# Patient Record
Sex: Female | Born: 1940
Health system: Southern US, Community
[De-identification: ages and names within clinical notes are randomized; demographics above are authoritative.]

## PROBLEM LIST (undated history)

## (undated) DIAGNOSIS — R112 Nausea with vomiting, unspecified: Secondary | ICD-10-CM

## (undated) DIAGNOSIS — K5730 Diverticulosis of large intestine without perforation or abscess without bleeding: Secondary | ICD-10-CM

## (undated) DIAGNOSIS — R17 Unspecified jaundice: Secondary | ICD-10-CM

## (undated) DIAGNOSIS — A0471 Enterocolitis due to Clostridium difficile, recurrent: Secondary | ICD-10-CM

## (undated) DIAGNOSIS — K648 Other hemorrhoids: Secondary | ICD-10-CM

## (undated) DIAGNOSIS — E039 Hypothyroidism, unspecified: Secondary | ICD-10-CM

## (undated) DIAGNOSIS — K219 Gastro-esophageal reflux disease without esophagitis: Secondary | ICD-10-CM

## (undated) DIAGNOSIS — F329 Major depressive disorder, single episode, unspecified: Secondary | ICD-10-CM

## (undated) DIAGNOSIS — A6000 Herpesviral infection of urogenital system, unspecified: Secondary | ICD-10-CM

## (undated) DIAGNOSIS — J449 Chronic obstructive pulmonary disease, unspecified: Secondary | ICD-10-CM

## (undated) DIAGNOSIS — Z9889 Other specified postprocedural states: Secondary | ICD-10-CM

## (undated) DIAGNOSIS — K589 Irritable bowel syndrome without diarrhea: Secondary | ICD-10-CM

## (undated) DIAGNOSIS — A498 Other bacterial infections of unspecified site: Secondary | ICD-10-CM

## (undated) DIAGNOSIS — F419 Anxiety disorder, unspecified: Secondary | ICD-10-CM

## (undated) DIAGNOSIS — M1990 Unspecified osteoarthritis, unspecified site: Secondary | ICD-10-CM

## (undated) DIAGNOSIS — J383 Other diseases of vocal cords: Secondary | ICD-10-CM

## (undated) DIAGNOSIS — G629 Polyneuropathy, unspecified: Secondary | ICD-10-CM

## (undated) DIAGNOSIS — F32A Depression, unspecified: Secondary | ICD-10-CM

## (undated) DIAGNOSIS — H269 Unspecified cataract: Secondary | ICD-10-CM

## (undated) HISTORY — DX: Enterocolitis due to Clostridium difficile, recurrent: A04.71

## (undated) HISTORY — PX: ABDOMINAL HYSTERECTOMY: SHX81

## (undated) HISTORY — DX: Gastro-esophageal reflux disease without esophagitis: K21.9

## (undated) HISTORY — PX: CHOLECYSTECTOMY: SHX55

## (undated) HISTORY — PX: BREAST BIOPSY: SHX20

## (undated) HISTORY — DX: Unspecified osteoarthritis, unspecified site: M19.90

## (undated) HISTORY — DX: Hypothyroidism, unspecified: E03.9

## (undated) HISTORY — DX: Other hemorrhoids: K64.8

## (undated) HISTORY — PX: SPINE SURGERY: SHX786

## (undated) HISTORY — PX: VESICOVAGINAL FISTULA CLOSURE W/ TAH: SUR271

## (undated) HISTORY — DX: Unspecified cataract: H26.9

## (undated) HISTORY — PX: OTHER SURGICAL HISTORY: SHX169

## (undated) HISTORY — DX: Polyneuropathy, unspecified: G62.9

## (undated) HISTORY — DX: Other bacterial infections of unspecified site: A49.8

## (undated) HISTORY — DX: Chronic obstructive pulmonary disease, unspecified: J44.9

## (undated) HISTORY — DX: Anxiety disorder, unspecified: F41.9

## (undated) HISTORY — DX: Diverticulosis of large intestine without perforation or abscess without bleeding: K57.30

---

## 2001-11-28 ENCOUNTER — Encounter: Payer: Self-pay | Admitting: Family Medicine

## 2001-11-28 ENCOUNTER — Ambulatory Visit (HOSPITAL_COMMUNITY): Admission: RE | Admit: 2001-11-28 | Discharge: 2001-11-28 | Payer: Self-pay | Admitting: Family Medicine

## 2002-08-21 ENCOUNTER — Encounter: Payer: Self-pay | Admitting: *Deleted

## 2002-08-21 ENCOUNTER — Ambulatory Visit (HOSPITAL_COMMUNITY): Admission: RE | Admit: 2002-08-21 | Discharge: 2002-08-21 | Payer: Self-pay | Admitting: *Deleted

## 2002-08-28 ENCOUNTER — Encounter: Payer: Self-pay | Admitting: Family Medicine

## 2002-08-28 ENCOUNTER — Ambulatory Visit (HOSPITAL_COMMUNITY): Admission: RE | Admit: 2002-08-28 | Discharge: 2002-08-28 | Payer: Self-pay | Admitting: Family Medicine

## 2002-10-29 ENCOUNTER — Ambulatory Visit (HOSPITAL_COMMUNITY): Admission: RE | Admit: 2002-10-29 | Discharge: 2002-10-29 | Payer: Self-pay | Admitting: Family Medicine

## 2002-10-29 ENCOUNTER — Encounter: Payer: Self-pay | Admitting: Family Medicine

## 2002-10-31 ENCOUNTER — Encounter: Payer: Self-pay | Admitting: Family Medicine

## 2002-10-31 ENCOUNTER — Ambulatory Visit (HOSPITAL_COMMUNITY): Admission: RE | Admit: 2002-10-31 | Discharge: 2002-10-31 | Payer: Self-pay | Admitting: Family Medicine

## 2003-03-21 ENCOUNTER — Encounter (INDEPENDENT_AMBULATORY_CARE_PROVIDER_SITE_OTHER): Payer: Self-pay | Admitting: Specialist

## 2003-03-21 ENCOUNTER — Ambulatory Visit (HOSPITAL_BASED_OUTPATIENT_CLINIC_OR_DEPARTMENT_OTHER): Admission: RE | Admit: 2003-03-21 | Discharge: 2003-03-21 | Payer: Self-pay | Admitting: Obstetrics and Gynecology

## 2004-07-23 ENCOUNTER — Ambulatory Visit (HOSPITAL_COMMUNITY): Admission: RE | Admit: 2004-07-23 | Discharge: 2004-07-23 | Payer: Self-pay | Admitting: Family Medicine

## 2004-08-07 ENCOUNTER — Ambulatory Visit (HOSPITAL_COMMUNITY): Admission: RE | Admit: 2004-08-07 | Discharge: 2004-08-07 | Payer: Self-pay | Admitting: Family Medicine

## 2005-08-23 ENCOUNTER — Emergency Department (HOSPITAL_COMMUNITY): Admission: EM | Admit: 2005-08-23 | Discharge: 2005-08-23 | Payer: Self-pay | Admitting: Emergency Medicine

## 2006-03-30 ENCOUNTER — Ambulatory Visit (HOSPITAL_COMMUNITY): Admission: RE | Admit: 2006-03-30 | Discharge: 2006-03-30 | Payer: Self-pay | Admitting: Obstetrics and Gynecology

## 2006-10-11 ENCOUNTER — Ambulatory Visit (HOSPITAL_COMMUNITY): Admission: RE | Admit: 2006-10-11 | Discharge: 2006-10-11 | Payer: Self-pay | Admitting: Family Medicine

## 2006-10-13 ENCOUNTER — Ambulatory Visit: Payer: Self-pay | Admitting: Orthopedic Surgery

## 2007-11-08 ENCOUNTER — Ambulatory Visit (HOSPITAL_COMMUNITY): Admission: RE | Admit: 2007-11-08 | Discharge: 2007-11-08 | Payer: Self-pay | Admitting: Family Medicine

## 2007-11-17 ENCOUNTER — Ambulatory Visit (HOSPITAL_COMMUNITY): Admission: RE | Admit: 2007-11-17 | Discharge: 2007-11-17 | Payer: Self-pay | Admitting: Internal Medicine

## 2008-01-12 ENCOUNTER — Ambulatory Visit (HOSPITAL_COMMUNITY): Admission: RE | Admit: 2008-01-12 | Discharge: 2008-01-12 | Payer: Self-pay | Admitting: Neurosurgery

## 2008-04-24 ENCOUNTER — Ambulatory Visit (HOSPITAL_COMMUNITY): Admission: RE | Admit: 2008-04-24 | Discharge: 2008-04-24 | Payer: Self-pay | Admitting: Internal Medicine

## 2008-06-20 HISTORY — PX: COLONOSCOPY: SHX174

## 2008-06-25 ENCOUNTER — Ambulatory Visit (HOSPITAL_COMMUNITY): Admission: RE | Admit: 2008-06-25 | Discharge: 2008-06-25 | Payer: Self-pay | Admitting: Family Medicine

## 2008-09-23 ENCOUNTER — Emergency Department (HOSPITAL_COMMUNITY): Admission: EM | Admit: 2008-09-23 | Discharge: 2008-09-23 | Payer: Self-pay | Admitting: Emergency Medicine

## 2008-11-06 ENCOUNTER — Inpatient Hospital Stay (HOSPITAL_COMMUNITY): Admission: RE | Admit: 2008-11-06 | Discharge: 2008-11-08 | Payer: Self-pay | Admitting: Neurosurgery

## 2009-01-08 ENCOUNTER — Ambulatory Visit (HOSPITAL_COMMUNITY): Admission: RE | Admit: 2009-01-08 | Discharge: 2009-01-08 | Payer: Self-pay | Admitting: Family Medicine

## 2009-03-20 ENCOUNTER — Ambulatory Visit (HOSPITAL_COMMUNITY): Admission: RE | Admit: 2009-03-20 | Discharge: 2009-03-20 | Payer: Self-pay | Admitting: Internal Medicine

## 2009-05-26 ENCOUNTER — Ambulatory Visit (HOSPITAL_COMMUNITY): Admission: RE | Admit: 2009-05-26 | Discharge: 2009-05-26 | Payer: Self-pay | Admitting: Neurosurgery

## 2009-08-22 ENCOUNTER — Ambulatory Visit (HOSPITAL_COMMUNITY): Admission: RE | Admit: 2009-08-22 | Discharge: 2009-08-22 | Payer: Self-pay | Admitting: Internal Medicine

## 2009-09-04 ENCOUNTER — Ambulatory Visit (HOSPITAL_COMMUNITY): Admission: RE | Admit: 2009-09-04 | Discharge: 2009-09-04 | Payer: Self-pay | Admitting: Ophthalmology

## 2009-09-11 ENCOUNTER — Ambulatory Visit (HOSPITAL_COMMUNITY): Admission: RE | Admit: 2009-09-11 | Discharge: 2009-09-11 | Payer: Self-pay | Admitting: Family Medicine

## 2009-09-15 ENCOUNTER — Emergency Department (HOSPITAL_COMMUNITY): Admission: EM | Admit: 2009-09-15 | Discharge: 2009-09-15 | Payer: Self-pay | Admitting: Emergency Medicine

## 2009-10-27 ENCOUNTER — Ambulatory Visit (HOSPITAL_COMMUNITY): Admission: RE | Admit: 2009-10-27 | Discharge: 2009-10-27 | Payer: Self-pay | Admitting: Internal Medicine

## 2010-01-05 ENCOUNTER — Ambulatory Visit (HOSPITAL_COMMUNITY)
Admission: RE | Admit: 2010-01-05 | Discharge: 2010-01-05 | Payer: Self-pay | Source: Home / Self Care | Admitting: Family Medicine

## 2010-01-19 ENCOUNTER — Ambulatory Visit (HOSPITAL_COMMUNITY): Admission: RE | Admit: 2010-01-19 | Discharge: 2010-01-19 | Payer: Self-pay | Admitting: Internal Medicine

## 2010-06-28 ENCOUNTER — Emergency Department (HOSPITAL_COMMUNITY): Admission: EM | Admit: 2010-06-28 | Discharge: 2010-06-28 | Payer: Self-pay | Admitting: Emergency Medicine

## 2010-08-05 ENCOUNTER — Ambulatory Visit (HOSPITAL_COMMUNITY)
Admission: RE | Admit: 2010-08-05 | Discharge: 2010-08-05 | Payer: Self-pay | Admitting: Physical Medicine and Rehabilitation

## 2010-08-13 ENCOUNTER — Ambulatory Visit (HOSPITAL_COMMUNITY): Admission: RE | Admit: 2010-08-13 | Discharge: 2010-08-13 | Payer: Self-pay | Admitting: Family Medicine

## 2010-09-24 HISTORY — PX: JOINT REPLACEMENT: SHX530

## 2010-10-07 ENCOUNTER — Emergency Department (HOSPITAL_COMMUNITY)
Admission: EM | Admit: 2010-10-07 | Discharge: 2010-10-07 | Payer: Self-pay | Source: Home / Self Care | Admitting: Emergency Medicine

## 2010-10-25 HISTORY — PX: EYE SURGERY: SHX253

## 2010-10-28 ENCOUNTER — Emergency Department (HOSPITAL_COMMUNITY)
Admission: EM | Admit: 2010-10-28 | Discharge: 2010-10-29 | Payer: Self-pay | Source: Home / Self Care | Admitting: Emergency Medicine

## 2010-10-28 LAB — CBC
HCT: 43 % (ref 36.0–46.0)
Hemoglobin: 15.5 g/dL — ABNORMAL HIGH (ref 12.0–15.0)
MCH: 33.8 pg (ref 26.0–34.0)
MCHC: 36 g/dL (ref 30.0–36.0)
MCV: 93.9 fL (ref 78.0–100.0)
Platelets: 299 10*3/uL (ref 150–400)
RBC: 4.58 MIL/uL (ref 3.87–5.11)
RDW: 14.1 % (ref 11.5–15.5)
WBC: 8.6 10*3/uL (ref 4.0–10.5)

## 2010-10-28 LAB — COMPREHENSIVE METABOLIC PANEL
ALT: 14 U/L (ref 0–35)
AST: 15 U/L (ref 0–37)
Albumin: 3.2 g/dL — ABNORMAL LOW (ref 3.5–5.2)
Alkaline Phosphatase: 80 U/L (ref 39–117)
BUN: 7 mg/dL (ref 6–23)
CO2: 24 mEq/L (ref 19–32)
Calcium: 8.8 mg/dL (ref 8.4–10.5)
Chloride: 100 mEq/L (ref 96–112)
Creatinine, Ser: 0.67 mg/dL (ref 0.4–1.2)
GFR calc Af Amer: >60 mL/min (ref >60)
GFR calc non Af Amer: >60 mL/min (ref >60)
Glucose, Bld: 91 mg/dL (ref 70–99)
Potassium: 3.6 mEq/L (ref 3.5–5.1)
Sodium: 133 mEq/L — ABNORMAL LOW (ref 135–145)
Total Bilirubin: 0.7 mg/dL (ref 0.3–1.2)
Total Protein: 6.4 g/dL (ref 6.0–8.3)

## 2010-10-28 LAB — DIFFERENTIAL
Basophils Absolute: 0 10*3/uL (ref 0.0–0.1)
Basophils Relative: 0 % (ref 0–1)
Eosinophils Absolute: 0.1 10*3/uL (ref 0.0–0.7)
Eosinophils Relative: 1 % (ref 0–5)
Lymphocytes Relative: 24 % (ref 12–46)
Lymphs Abs: 2 10*3/uL (ref 0.7–4.0)
Monocytes Absolute: 0.8 10*3/uL (ref 0.1–1.0)
Monocytes Relative: 10 % (ref 3–12)
Neutro Abs: 5.7 10*3/uL (ref 1.7–7.7)
Neutrophils Relative %: 66 % (ref 43–77)

## 2010-10-28 LAB — LIPASE, BLOOD: Lipase: 18 U/L (ref 11–59)

## 2010-11-18 ENCOUNTER — Encounter: Payer: Self-pay | Admitting: Internal Medicine

## 2010-11-18 ENCOUNTER — Ambulatory Visit
Admission: RE | Admit: 2010-11-18 | Discharge: 2010-11-18 | Payer: Self-pay | Source: Home / Self Care | Attending: Internal Medicine | Admitting: Internal Medicine

## 2010-11-18 DIAGNOSIS — R110 Nausea: Secondary | ICD-10-CM | POA: Insufficient documentation

## 2010-11-18 DIAGNOSIS — R1013 Epigastric pain: Secondary | ICD-10-CM | POA: Insufficient documentation

## 2010-11-19 ENCOUNTER — Encounter: Payer: Self-pay | Admitting: Gastroenterology

## 2010-11-25 ENCOUNTER — Ambulatory Visit (HOSPITAL_COMMUNITY)
Admission: RE | Admit: 2010-11-25 | Discharge: 2010-11-25 | Disposition: A | Discharge status: Home / Self Care | Payer: Medicare Other | Attending: Internal Medicine | Admitting: Internal Medicine

## 2010-11-25 ENCOUNTER — Encounter: Payer: BLUE CROSS/BLUE SHIELD | Admitting: Internal Medicine

## 2010-11-25 DIAGNOSIS — K449 Diaphragmatic hernia without obstruction or gangrene: Secondary | ICD-10-CM | POA: Insufficient documentation

## 2010-11-25 DIAGNOSIS — R110 Nausea: Secondary | ICD-10-CM | POA: Insufficient documentation

## 2010-11-25 DIAGNOSIS — R1310 Dysphagia, unspecified: Secondary | ICD-10-CM

## 2010-11-25 DIAGNOSIS — R197 Diarrhea, unspecified: Secondary | ICD-10-CM | POA: Insufficient documentation

## 2010-11-25 DIAGNOSIS — R1013 Epigastric pain: Secondary | ICD-10-CM | POA: Insufficient documentation

## 2010-11-25 HISTORY — PX: ESOPHAGOGASTRODUODENOSCOPY: SHX1529

## 2010-11-26 NOTE — Assessment & Plan Note (Signed)
Summary: per RMR pt needs URGENT spot today w/AS   Visit Type:  Initial Consult Referring Provider:  Dr. Regino Schultze Primary Care Provider:  Dr. Regino Schultze  CC:  nausea x 4 weeks.  History of Present Illness: Pt presents today at the request of Dr. Regino Schultze secondary to nausea and reported diarrhea. She states she is here mainly due to nausea. Was seen in the ED a week before seeing Dr. Regino Schultze Jan 17th, secondary to nausea. Has had cdiff culture, stool cultures, all reportedly negative. We do not have these results currently. Denies emesis. +loose stools started a few weeks ago, little bits at a time. Had bout of constipation last week for four days. Loose stools seem to be tapering off. No abdominal pain, but does report epigastric soreness X 2-3 weeks.  +nausea, intermittently, relieved by "shots" for several days, then starts again. not exacerbated by eating/drinking. can tolerate potatoes, vanilla milkshake, crackers. Does have dysphagia with certain types of textures, such as bread, pills. no loss of appetite. no weight loss. Takes BC powders, at least 1-2 per day. Usually averages every other day BC powders.  No sick contacts, no abx. no travel. No change of medicine. Hx of reflux, hasn't taken omeprazole secondary to feeling so nauseated. Restarted omeprazole on "Sunday. Recently had thumb joint replacement October 20, 2010.   As of note, had a colonoscopy approximately a year ago with Dr. Rehman. Pt also remembers an upper endoscopy at some point prior to this. She believes she may have had a dilation at that time as well. No reports available but have been requested.   Current Medications (verified): 1)  Levothyroxine Sodium 25 Mcg Tabs (Levothyroxine Sodium) .... Once Daily 2)  Promethazine Hcl 25 Mg Tabs (Promethazine Hcl) .... As Needed 3)  Omeprazole 20 Mg Cpdr (Omeprazole) .... Once Daily  Allergies (verified): 1)  ! Sulfa 2)  ! Morphine 3)  ! Codeine 4)  ! Avelox 5)  ! Dilaudid  (Hydromorphone Hcl)  Past History:  Past Medical History: GERD Hypothyroidism Arthritis TCS 2010 or 2011?  Past Surgical History: Anterior cervical fusion X 2 Joint replacement (thumb) 09/2010 Hysterectomy Cholecystectomy  Family History: Mother:living, recurrent UTIs, otherwise non-contributory Father:deceased, Alzheimers Brother: thyroidectomy No FH of Colon Cancer:  Social History: Retired Married X 25 years Patient currently smokes. <1ppd X 50 years Alcohol Use - yes, rare  Smoking Status:  current  Review of Systems General:  Denies fever, chills, and anorexia. Eyes:  Denies blurring, irritation, and discharge. ENT:  Complains of difficulty swallowing; denies sore throat and hoarseness. CV:  Denies chest pains and syncope. Resp:  Denies dyspnea at rest and wheezing. GI:  Complains of difficulty swallowing, nausea, and diarrhea; denies pain on swallowing, bloody BM's, and black BMs. GU:  Denies urinary burning and urinary frequency. MS:  Denies joint pain / LOM, joint swelling, and joint stiffness. Derm:  Denies rash, itching, and dry skin. Neuro:  Denies weakness and syncope. Psych:  Denies depression and anxiety. Endo:  Denies cold intolerance and heat intolerance.  Vital Signs:  Patient profile:   70 year old female Height:      63 inches Weight:      135 pounds BMI:     24.00 Temp:     97" .9 degrees F oral Pulse rate:   84 / minute BP sitting:   120 / 78  (right arm) Cuff size:   regular  Vitals Entered By: Hendricks Limes LPN (November 18, 2010 11:08 AM)  Physical Exam  General:  Well developed, well nourished, no acute distress. Head:  Normocephalic and atraumatic. Eyes:  PERRLA, no icterus. Lungs:  Clear throughout to auscultation. Heart:  Regular rate and rhythm; no murmurs, rubs,  or bruits. Abdomen:  +BS, soft, mildly tender to palpation epigastric region. no rebound or guarding. no HSM>  Msk:  Symmetrical with no gross deformities. Normal  posture. Pulses:  Normal pulses noted. Neurologic:  Alert and  oriented x4;  grossly normal neurologically. Skin:  Intact without significant lesions or rashes. Psych:  Alert and cooperative. Normal mood and affect.  Impression & Recommendations:  Problem # 1:  NAUSEA (ICD-42.41)  70 year old female with intermittent nausea, +dysphagia with bread/pills. +epigastric pain. No loss of appetite. reported hx of upper endoscopy a few years ago with possible dilatation (these records are not available but have been requested). Also concerning is consistent use of BC powders, daily, and +smoker. Likely nausea, epigastric pain r/t possible gastritis vs PUD.   Switch to Dexilant. Samples have been given. Pt will call if notices significant difference EGD/ED with RMR in the near future: the R/B/A have been discussed in detail, and pt desires to proceed Obtain colonoscopy and EGD reports from [facility] any recent blood work from Dr. Regino Schultze  Orders: Consultation Level III [phone])  Problem # 2:  ABDOMINAL PAIN-EPIGASTRIC (ICD-789.06)  See #1  Orders: Consultation Level III (60454)  Problem # 3:  DIARRHEA (ICD-787.91)  Diarrhea seems to be self-limited, actually improving. Doubt infectious process. Has had recent colonoscopy but no reports available. Stool studies pending, awaiting results. Reassurring that this is resolving.   May take immodium as needed.  Review stool studies when available Contact if no improvement  Orders: Consultation Level III (09811)

## 2010-11-26 NOTE — Letter (Signed)
Summary: EGD/ED ORDER  EGD/ED ORDER   Imported By: Ave Filter 11/18/2010 12:02:08  _____________________________________________________________________  External Attachment:    Type:   Image     Comment:   External Document

## 2010-11-26 NOTE — Letter (Signed)
Summary: REFERRAL FROM [facility]  REFERRAL FROM [facility]   Imported By: Rexene Alberts 11/19/2010 09:32:12  _____________________________________________________________________  External Attachment:    Type:   Image     Comment:   External Document

## 2010-11-27 ENCOUNTER — Telehealth (INDEPENDENT_AMBULATORY_CARE_PROVIDER_SITE_OTHER): Payer: Self-pay

## 2010-11-28 NOTE — Op Note (Signed)
NAME:  Mary Hart, Mary Hart              ACCOUNT NO.:  [id number]  MEDICAL RECORD NO.:  [id number]           PATIENT TYPE:  O  LOCATION:  DAY                           FACILITY:  [facility]  PHYSICIAN:  R. Roetta Sessions, M.D. DATE OF BIRTH:  Nov 03, 1940  DATE OF PROCEDURE:  11/25/2010 DATE OF DISCHARGE:                              OPERATIVE REPORT   PROCEDURE:  EGD with Elease Hashimoto dilation.  INDICATIONS FOR PROCEDURE:  A 70 year old lady with recent protracted episode of nausea and self-limiting diarrhea following a thumb joint replacement procedure well over month ago.  Has had intermittent epigastric pain and worsening esophageal dysphagia to solids.  She has been exposed to narcotics related to orthopedic surgery recently to which she is very sensitive.  Just recently, she switched from omeprazole to Dexilant through our office and this was associated with marked improvement in reflux, epigastric pain, and nausea.  She rarely takes any antiemetic therapy now and is eating very well.  Because of her upper GI tract symptoms, particularly dysphagia, EGD is now being done.  Risks, benefits, limitations, alternatives, imponderables have been discussed, questions answered.  Please see the documentation in the medical record.  PROCEDURE NOTE:  O2 saturation, blood pressure, pulse, respirations were monitored throughout the entire procedure.  CONSCIOUS SEDATION:  Versed 5 mg IV, Demerol mg IV in divided doses.  INSTRUMENT:  Pentax video chip system.  FINDINGS:  EGD examination of tubular esophagus revealed a patent tubular esophagus, mucosa appeared normal.  EG junction easily traversed with scope.  Stomach:  Gastric cavity was emptied and insufflated well with air. Thorough examination of gastric mucosa including retroflexion of proximal stomach and esophagogastric junction demonstrated no abnormalities aside from small hiatal hernia.  Pylorus was patent, easily traversed.  Examination of  the bulb and second portion revealed no abnormalities.  THERAPEUTIC/DIAGNOSTIC MANEUVERS PERFORMED:  Scope was withdrawn.  A 56- French Maloney dilator was passed to full insertion with slight resistance upon full insertion.  A look back revealed two superficial tears to the mucosa, there was minimal bleeding, otherwise no apparent complication related to passage of the dilator.  The patient tolerated the procedure well, was reactive to endoscopy.  IMPRESSION: 1. Normal-appearing tubular esophagus status post passage of the 10-     French Maloney dilator from a small hiatal hernia, otherwise     stomach and duodenum through the second portion appeared normal. 2. I suspect recent narcotic intolerance is a contributing factor to     nausea, poorly-controlled reflux symptoms on omeprazole and much     better all the way around Dexilant, however, the patient tells me     it is a 2 or 3 medication with her insurance coverage cannot     afford it.  She has not tried Prevacid previously.  RECOMMENDATIONS: 1. Stop Dexilant, try Prevacid 30 mg orally daily, generic     prescription provided. 2. Stop smoking. 3. Minimized use of aspirin powders. 4. Chloraseptic spray for the next day or two if she has a sore     throat.     Jonathon Bellows, M.D.     RMR/MEDQ  D:  11/25/2010  T:  11/25/2010  Job:  [id number]  cc:   Kirk Ruths, M.D. Fax: [phone]  Electronically Signed by Lorrin Goodell M.D. on 11/26/2010 09:31:37 AM

## 2010-11-30 ENCOUNTER — Ambulatory Visit: Payer: Self-pay | Admitting: Urgent Care

## 2010-12-02 NOTE — Progress Notes (Addendum)
Summary: nausea  Phone Note Call from Patient Call back at Home Phone [phone]   Caller: Patient Summary of Call: pt called- she started taking the prevacid this morning and now has diarrhea and cramping in her stomach and alot of nausea. no SOB, no fever, no vomiting. pt has taken phenergan 25mg  today with no help. Advised pt to stop taking prevacid for now. please advise. Initial call taken by: Hendricks Limes LPN,  November 27, 2010 2:12 PM     Appended Document: nausea Called to check on pt. Said she is doing good now. She thinks she was sick from the anesthesia, she was over the diarrhea and cramping in a couple of days and states she is doing good now.

## 2010-12-17 ENCOUNTER — Other Ambulatory Visit: Payer: Self-pay | Admitting: Ophthalmology

## 2010-12-17 ENCOUNTER — Encounter (HOSPITAL_COMMUNITY): Payer: Medicare Other

## 2010-12-17 DIAGNOSIS — Z01818 Encounter for other preprocedural examination: Secondary | ICD-10-CM | POA: Insufficient documentation

## 2010-12-17 LAB — BASIC METABOLIC PANEL
BUN: 9 mg/dL (ref 6–23)
Calcium: 9 mg/dL (ref 8.4–10.5)
Chloride: 105 mEq/L (ref 96–112)
Creatinine, Ser: 0.78 mg/dL (ref 0.4–1.2)
GFR calc Af Amer: >60 mL/min (ref >60)
GFR calc non Af Amer: >60 mL/min (ref >60)
Glucose, Bld: 46 mg/dL — ABNORMAL LOW (ref 70–99)

## 2010-12-17 LAB — HEMOGLOBIN AND HEMATOCRIT, BLOOD: Hemoglobin: 14 g/dL (ref 12.0–15.0)

## 2010-12-21 ENCOUNTER — Ambulatory Visit (HOSPITAL_COMMUNITY)
Admission: RE | Admit: 2010-12-21 | Discharge: 2010-12-21 | Disposition: A | Discharge status: Home / Self Care | Payer: Medicare Other | Source: Ambulatory Visit | Attending: Ophthalmology | Admitting: Ophthalmology

## 2010-12-21 DIAGNOSIS — Z01818 Encounter for other preprocedural examination: Secondary | ICD-10-CM | POA: Insufficient documentation

## 2010-12-21 DIAGNOSIS — H2510 Age-related nuclear cataract, unspecified eye: Secondary | ICD-10-CM | POA: Insufficient documentation

## 2010-12-30 ENCOUNTER — Telehealth (INDEPENDENT_AMBULATORY_CARE_PROVIDER_SITE_OTHER): Payer: Self-pay

## 2011-01-05 NOTE — Progress Notes (Signed)
Summary: pt requests change in PPI due to financial situation  Phone Note Call from Patient   Caller: Patient Summary of Call: Pt came by office and said the Lansoprazole is working well. It just cost $39.00. She and her husband's medical bills exceed $700.00 monthly. She is just trying to get something on her insurance less expensive. She can do the Omerazole 20 mg (which has done well in the past... or Pantoprozole 20 mg, which she hasn't tried. She would like to get a prescription called to [place] in [place].  Initial call taken by: Cloria Spring LPN,  December 30, 2010 11:24 AM     Appended Document: pt requests change in PPI due to financial situation efficacy of omeprazole seemed to be less recently; suggest we go w generic protonix 69m mg daily  - Rx #30 one refill  Appended Document: pt requests change in PPI due to financial situation Pt aware, Rx  called to [facility] @ [facility].

## 2011-01-07 LAB — POCT CARDIAC MARKERS
CKMB, poc: 1.7 ng/mL (ref 1.0–8.0)
Myoglobin, poc: 55.3 ng/mL (ref 12–200)
Troponin i, poc: <0.05 ng/mL (ref 0.00–0.09)

## 2011-01-10 LAB — CREATININE, SERUM
Creatinine, Ser: 0.66 mg/dL (ref 0.4–1.2)
GFR calc Af Amer: >60 mL/min (ref >60)
GFR calc non Af Amer: >60 mL/min (ref >60)

## 2011-01-27 LAB — BASIC METABOLIC PANEL
CO2: 26 mEq/L (ref 19–32)
Calcium: 8.7 mg/dL (ref 8.4–10.5)
Creatinine, Ser: 0.76 mg/dL (ref 0.4–1.2)
GFR calc Af Amer: >60 mL/min (ref >60)
GFR calc non Af Amer: >60 mL/min (ref >60)
Sodium: 141 mEq/L (ref 135–145)

## 2011-01-27 LAB — GLUCOSE, CAPILLARY: Glucose-Capillary: 87 mg/dL (ref 70–99)

## 2011-01-27 LAB — HEMOGLOBIN AND HEMATOCRIT, BLOOD: HCT: 41 % (ref 36.0–46.0)

## 2011-02-03 ENCOUNTER — Other Ambulatory Visit: Payer: Self-pay | Admitting: Internal Medicine

## 2011-02-03 NOTE — Telephone Encounter (Signed)
Ok to refill x 1 year 

## 2011-02-08 LAB — CBC
MCHC: 33.9 g/dL (ref 30.0–36.0)
MCV: 95.5 fL (ref 78.0–100.0)
RBC: 4.35 MIL/uL (ref 3.87–5.11)
RDW: 14.4 % (ref 11.5–15.5)

## 2011-02-23 ENCOUNTER — Ambulatory Visit: Payer: BLUE CROSS/BLUE SHIELD | Admitting: Gastroenterology

## 2011-03-09 NOTE — H&P (Signed)
NAMEJAZMINN, POMALES NO.:  [id number]   MEDICAL RECORD NO.:  [id number]          PATIENT TYPE:  INP   LOCATION:  3015                         FACILITY:  [facility]   PHYSICIAN:  Hilda Lias, M.D.   DATE OF BIRTH:  1941/04/30   DATE OF ADMISSION:  11/06/2008  DATE OF DISCHARGE:                              HISTORY & PHYSICAL   Ms. Blackwelder is lady who came to my office a year ago complaining of neck  pain, which has been going on for more than a year.  She had physical  therapy.  She feels that she is getting worse.  We did a conservative  treatment, which did not improve her condition.  She had an epidural  injection.  We did an ultrasound myelogram that showed severe stenosis  at the level of 4-5.  Because of that, she is being admitted.  Previously, this lady has had C5, 6, and 7 fusion about 10 years ago.   PAST MEDICAL HISTORY:  1. Cervical diskectomy and fusion at 5-6, and 6-7 in 1990.  2. Hysterectomy.  3. Cholecystectomy.   She is allergic to SULFA.   FAMILY HISTORY:  Unremarkable.   PHYSICAL EXAMINATION:  HEAD, EARS, NOSE, AND THROAT:  Normal.  NECK:  She has a decreased flexibility of her cervical spine.  LUNGS:  Clear.  HEART:  Heart sounds normal.  EXTREMITIES:  Normal pulses.  NEUROLOGIC:  She has a weakness of both deltoids.  She has numbness of  the left hand.   The surgery myelogram showed that she has stenosis at the level of C4-  C5, mostly going to the left side and some tissue also in the left side.   CLINICAL IMPRESSION:  Cervical stenosis at C4-C5 with some soft tissue  at the level of 5-6.   RECOMMENDATIONS:  She had failed with conservative treatment.  We are  going to take her to surgery, and we are going to do a C5 corpectomy and  that way we are going to take care of the C4-C5 stenosis, as well as the  soft tissue, which is in the 5-6, mostly going to the left side.  She  knows about the surgery.  She knows that she is going to  wear her brace  for at least 4-6-week.  The procedure will be C5 corpectomy with fusion  from C4-C6.  The risk of the course was fully explained to her and is  similar to the what she had 20 years ago including the possibility of  infection, CSF leak, worsening pain, paralysis, and need of further  surgery.          ______________________________  Hilda Lias, M.D.    EB/MEDQ  D:  11/06/2008  T:  11/06/2008  Job:  [id number]

## 2011-03-09 NOTE — Op Note (Signed)
NAMEDELORICE, BANNISTER NO.:  [id number]   MEDICAL RECORD NO.:  [id number]          PATIENT TYPE:  INP   LOCATION:  3015                         FACILITY:  [facility]   PHYSICIAN:  Hilda Lias, M.D.   DATE OF BIRTH:  1941-06-01   DATE OF PROCEDURE:  11/06/2008  DATE OF DISCHARGE:                               OPERATIVE REPORT   PREOPERATIVE DIAGNOSES:  C4-C5 stenosis status post fusion C5-C6 and C6-  C7 20 years ago, chronic radiculopathy.   POSTOPERATIVE DIAGNOSES:  C4-C5 stenosis status post fusion C5-C6 and C6-  C7 20 years ago, chronic radiculopathy.   PROCEDURES:  C5 corpectomy, decompression of the spinal cord and  bilateral foraminotomy, C4-C5 and C5-C6, graft plate, and microscope.   SURGEON:  Hilda Lias, MD   ASSISTANT:  Coletta Memos, MD   CLINICAL HISTORY:  Ms. Darko is a 71 year old lady who about 20 years  ago underwent cervical fusion by me at the level of C5-C6 and C6-C7.  I  have been seeing this lady until about a year ago when she complained of  more pain.  She had failed with conservative treatment.  Myelogram shows  stenosis at the level of C4-C5 and soft tissue at the level of C5-C6 on  the left side.  The radiologist was not quite sure about this being disk  which I doubt old scar tissue.  Nevertheless, the main problem was on  the left side and the main complaint of the patient was to the left  side.  Because of that, surgery was advised.  The patient knew about the  risks.   PROCEDURE IN DETAIL:  The patient was taken to the OR.  After  intubation, the left side of the neck was cleaned with DuraPrep.  The  patient had quite a bit of the large shoulder and I was interfering with  doing a transverse incision.  Because of that, we decided to go ahead  with a longitudinal incision through the skin, platysma down to the  cervical area.  The patient had quite a bit of adhesion and lysis was  accomplished.  We found the disk space and  x-rays showed that indeed we  were at the level of C4-C5.  Because of that, using the drill with  drilling at the C5 vertebral body and complete corpectomy was  accomplished.  The patient had quite a bit of scar tissue.  On the left  side, she has some narrow, mostly at the C4-C5 to the left and that was  made bigger using 2-3 mm Kerrison punch.  The right side C5-C6 was  opened.  The left side at the level of C5-C6, we found mostly scar  tissue.  Lysis was accomplished with decompression of the C6 nerve root.  At the level of C4-C5 to the left, we have overgrowth of the joint  producing narrow to canal quite a bit of scar tissue.  Lysis was  accomplished and foraminotomy was done.  From then on, we took a piece  of the graft of approximately 19 mm.  It was tailored to going to  the  cavity.  We pulled ahead and we were able to set the graft at the level of the  wound defect without any problem.  Then, a plate using 4 screws was  involved to keep the bone graft in place.  The area was irrigated.  Nevertheless, we left a drain in the paracervical area.  Hemostasis was  accomplished.  The wound was closed with Vicryl and Steri-Strips.           ______________________________  Hilda Lias, M.D.     EB/MEDQ  D:  11/06/2008  T:  11/07/2008  Job:  [id number]

## 2011-03-12 NOTE — Op Note (Signed)
   NAME:  Mary Hart, Mary Hart                        ACCOUNT NO.:  [id number]   MEDICAL RECORD NO.:  [id number]                   PATIENT TYPE:  AMB   LOCATION:  [facility]                                 [facility]ACILITY[facility]  [facility]   PHYSICIAN:  Katherine Roan, M.D.               DATE OF BIRTH:  May 09, 1941   DATE OF PROCEDURE:  03/21/2003  DATE OF DISCHARGE:                                 OPERATIVE REPORT   PREOPERATIVE DIAGNOSIS:  Perineal lesion.   POSTOPERATIVE DIAGNOSIS:  Perineal lesion, rule out Bowen's disease.   DESCRIPTION OF PROCEDURE:  The patient was placed in the lithotomy position  and prepped and draped in the usual sterile fashion. The lesion  was  infiltrated with 0.5% Marcaine with epinephrine and excised with margins.  The area was then cauterized with the Bovie and closed with interrupted  sutures of 4-0 Vicryl and 3-0 plain.   The patient tolerated the procedure well. She was sent to the recovery room  in good condition.                                                  Katherine Roan, M.D.    SDM/MEDQ  D:  03/21/2003  T:  03/21/2003  Job:  [id number]

## 2011-08-09 ENCOUNTER — Other Ambulatory Visit (HOSPITAL_COMMUNITY): Payer: Self-pay | Admitting: Orthopedic Surgery

## 2011-08-09 DIAGNOSIS — M25569 Pain in unspecified knee: Secondary | ICD-10-CM

## 2011-08-11 ENCOUNTER — Ambulatory Visit (HOSPITAL_COMMUNITY)
Admission: RE | Admit: 2011-08-11 | Discharge: 2011-08-11 | Disposition: A | Discharge status: Home / Self Care | Payer: Medicare Other | Source: Ambulatory Visit | Attending: Orthopedic Surgery | Admitting: Orthopedic Surgery

## 2011-08-11 DIAGNOSIS — M7120 Synovial cyst of popliteal space [Baker], unspecified knee: Secondary | ICD-10-CM | POA: Insufficient documentation

## 2011-08-11 DIAGNOSIS — M25569 Pain in unspecified knee: Secondary | ICD-10-CM | POA: Insufficient documentation

## 2011-08-11 DIAGNOSIS — M25469 Effusion, unspecified knee: Secondary | ICD-10-CM | POA: Insufficient documentation

## 2011-08-11 DIAGNOSIS — M2240 Chondromalacia patellae, unspecified knee: Secondary | ICD-10-CM | POA: Insufficient documentation

## 2011-10-21 ENCOUNTER — Ambulatory Visit (HOSPITAL_COMMUNITY): Payer: Medicare Other | Admitting: Psychiatry

## 2011-11-02 ENCOUNTER — Encounter (HOSPITAL_COMMUNITY): Payer: Self-pay | Admitting: Psychiatry

## 2011-11-02 ENCOUNTER — Ambulatory Visit (INDEPENDENT_AMBULATORY_CARE_PROVIDER_SITE_OTHER): Payer: Medicare Other | Admitting: Psychiatry

## 2011-11-02 DIAGNOSIS — F329 Major depressive disorder, single episode, unspecified: Secondary | ICD-10-CM | POA: Diagnosis not present

## 2011-11-02 NOTE — Patient Instructions (Signed)
Discussed orally 

## 2011-11-04 NOTE — Progress Notes (Signed)
Patient:   Mary Hart   DOB:   12-Apr-1941  MR Number:  [id number]  Location:  [address], [place], [place] [address]  Date of Service:   11/02/2011  Start Time:   10:30 AM End Time:   11:30 AM  Provider/Observer:  Florencia Reasons, MSW, LCSW delete this  Billing Code/Service:  [phone]  Chief Complaint:     Chief Complaint  Patient presents with  . Depression    Reason for Service:  The patient is referred for services by Dr. Arley Hart  due to to patient experiencing anger, frustration, and depression. Patient reports she has experienced symptoms several years but they have become more severe since her husband suffered a traumatic brain injury in April 2012. She expresses frustration and fear that husband will not stop smoking although he has COPD. She also reports her husband has severe anxiety. The patient also reports stress related to caretaker responsibilities for her 81 year old mother who has been extremely critical of patient and whom he should suspects has bipolar disorder as her mother experience anger tirades.  Current Status:  The patient reports anger, irritability, loss of interest, crying spells, and depressed mood  Reliability of Information: Patient appears to be reliable informant.  Behavioral Observation: Mary Hart  presents as a 71 y.o.-year-old  Caucasian Female who appeared her stated age. Her dress was appropriate and she was well groomed. Her manners were appropriate to the situation.  There were not any physical disabilities noted.  She displayed an appropriate level of cooperation and motivation.    Interactions:    Active   Attention:   within normal limits  Memory:   within normal limits  Visuo-spatial:   within normal limits  Speech (Volume):  normal  Speech:   normal pitch and normal volume  Thought Process:  Coherent  Though Content:  WNL  Orientation:   person, place, time/date, situation, day of week, month of year and  year  Judgment:   Good  Planning:   Good  Affect:    Appropriate  Mood:    Depressed  Insight:   Good  Intelligence:   normal  Marital Status/Living: The patient was born in [place], [place]. She is the oldest of 5 siblings. She reports having a loving audible father and a judgment will hateful mother during her childhood.  The patient has been married twice. She left her first marriage after 13 years due to her husband's infidelity. She has a 77 year old son from that marriage. She and her current husband have been married for 25 years. The patient has 1 stepson age 69, and 2 stepdaughters ages 86 and 73. Patient and her husband reside in [place].  Current Employment: Retired  Past Employment:  Patient reports a stable work history at [employer] where she was employed for 38 years.  Substance Use:  No concerns of substance abuse are reported.    Education:   HS Graduate. Patient reports completing 1 semester at college.  Medical History:   Past Medical History  Diagnosis Date  . GERD (gastroesophageal reflux disease)   . Hypothyroid   . Arthritis   . Neuropathy     Sexual History:   History  Sexual Activity  . Sexually Active:     Abuse/Trauma History: The patient reports her mother was verbally abusive and was extremely critical of the patient as well as her siblings.  Psychiatric History:  The patient denies any psychiatric hospitalizations. She reports no previous involvement  in outpatient therapy. She has been prescribed Xanax by her primary care physician but reports seldom taking this. She also reports being in prescribed Lexapro by her OB/GYN several years ago but reports having an adverse reaction.  Family Med/Psych History: Mother-depression, suspicious of bipolar disorder; maternal grandmother and maternal aunt-anxiety disorder; brother, niece, and nephew-bipolar disorder; maternal uncle and brother-alcohol dependence/abuse. Patient also reports one  of her brothers completed suicide  Risk of Suicide/Violence: None. Patient denies past and current suicidal and homicidal ideations. She reports no history of aggression or violent behavior  Impression/DX:  The patient reports experiencing anger, irritability, depressed mood, and loss of interest in activities. She reports suffering mild symptoms of depression for years with symptoms becoming worse since April 2012 when her husband suffered a traumatic brain injury. She also reports stress related to caretaker responsibilities for her 69-year-old mother. Depressive disorder NOS  Disposition/Plan:  The patient attends the assessment appointment today. Confidentiality and limits are discussed. The patient agrees to call this practice, call 911, or have someone take her to the emergency room should symptoms worsen. Crisis contact information is provided. She agrees to return for an appointment in one week for continuing assessment and treatment planning.  Diagnosis:    Axis I:   1. Depressive disorder         Axis II: Deferred       Axis III:  See medical history      Axis IV:  problems with primary support group          Axis V:  51-60 moderate symptoms

## 2011-11-10 DIAGNOSIS — H43819 Vitreous degeneration, unspecified eye: Secondary | ICD-10-CM | POA: Diagnosis not present

## 2011-11-10 DIAGNOSIS — H353190 Nonexudative age-related macular degeneration, unspecified eye, stage unspecified: Secondary | ICD-10-CM | POA: Diagnosis not present

## 2011-11-10 DIAGNOSIS — H43399 Other vitreous opacities, unspecified eye: Secondary | ICD-10-CM | POA: Diagnosis not present

## 2011-11-16 ENCOUNTER — Ambulatory Visit (HOSPITAL_COMMUNITY): Payer: Medicare Other | Admitting: Psychiatry

## 2011-11-18 ENCOUNTER — Ambulatory Visit (INDEPENDENT_AMBULATORY_CARE_PROVIDER_SITE_OTHER): Payer: Medicare Other | Admitting: Psychiatry

## 2011-11-18 ENCOUNTER — Encounter (HOSPITAL_COMMUNITY): Payer: Self-pay | Admitting: Psychiatry

## 2011-11-18 DIAGNOSIS — F329 Major depressive disorder, single episode, unspecified: Secondary | ICD-10-CM

## 2011-11-18 DIAGNOSIS — H9209 Otalgia, unspecified ear: Secondary | ICD-10-CM | POA: Diagnosis not present

## 2011-11-18 DIAGNOSIS — H60399 Other infective otitis externa, unspecified ear: Secondary | ICD-10-CM | POA: Diagnosis not present

## 2011-11-22 NOTE — Progress Notes (Signed)
Patient:  Mary Hart   DOB: 27-Oct-1940  MR Number: [id number]  [facility]:  [address] [place] [place],  [place], [address]  Start: 11/18/2011 11:30 AM End: 11/18/2011 12:25 PM  Provider/Observer:     Florencia Reasons, MSW, LCSW   Chief Complaint:      Chief Complaint  Patient presents with  . Depression    Reason For Service:     The patient was referred for services by Dr. Arley Phenix due to patient experiencing anger, frustration, and depression. The patient reports she has experienced symptoms several years but they have become more severe since her husband suffered a traumatic brain injury in April 2012. She expresses frustration and fear that her husband will not stop smoking although he has COPD. She also reports her husband has severe anxiety. The patient reports stress related to her caretaker responsibilities for her 3 year old mother who has been extremely critical of patient and whom patient suspects has bipolar disorder as her mother experiences anger tirades.  Interventions Strategy:  Supportive therapy  Participation Level:   Active  Participation Quality:  Appropriate      Behavioral Observation:  Well Groomed, Alert, and Appropriate.   Current Psychosocial Factors: The patient has caretaker responsibilities for her husband as well as her 81 year old mother.  Content of Session:   Establishing rapport, reviewing symptoms, identifying stressors, identifying her support system, identifying ways to use her support system, identifying possible community resources, identifying areas within patient's control.  Current Status:   The patient reports continued anxiety, depressed mood, anger, and irritability.  Patient Progress:   Fair. The patient expresses continued frustration regarding her husband as he has severe anxiety and frequently experiences panic attacks. Patient reports her husband often wants to go to the doctor or the emergency room. She  also expresses frustration regarding the daily assistance she has to provide to has been due to his traumatic brain injury. The patient reports being tired and overwhelmed. She reports little to no time for self.. She has been reluctant to solicit help from her support group.  Target Goals:   Decrease anxiety, improve coping and stress management skills  Last Reviewed:   11/18/2011  Goals Addressed Today:    Decrease anxiety, improve coping and stress management skills  Impression/Diagnosis:   The patient reports experiencing anger, irritability, depressed mood, loss of interest in activities. She reports suffering mild symptoms of depression for years with symptoms worsening since April 2012 when her husband suffered a traumatic brain injury. She also reports stress related to caretaker responsibilities for her 69 year old mother. Diagnoses depressive disorder NOS  Diagnosis:  Axis I:  1. Depressive disorder             Axis II: No diagnosis

## 2011-11-22 NOTE — Patient Instructions (Signed)
Discussed orally 

## 2011-12-01 DIAGNOSIS — R109 Unspecified abdominal pain: Secondary | ICD-10-CM | POA: Diagnosis not present

## 2011-12-01 DIAGNOSIS — R197 Diarrhea, unspecified: Secondary | ICD-10-CM | POA: Diagnosis not present

## 2011-12-01 DIAGNOSIS — M25579 Pain in unspecified ankle and joints of unspecified foot: Secondary | ICD-10-CM | POA: Diagnosis not present

## 2011-12-01 DIAGNOSIS — Z6825 Body mass index (BMI) 25.0-25.9, adult: Secondary | ICD-10-CM | POA: Diagnosis not present

## 2012-01-04 ENCOUNTER — Telehealth (HOSPITAL_COMMUNITY): Payer: Self-pay | Admitting: Dietician

## 2012-01-04 DIAGNOSIS — J069 Acute upper respiratory infection, unspecified: Secondary | ICD-10-CM | POA: Diagnosis not present

## 2012-01-04 DIAGNOSIS — L821 Other seborrheic keratosis: Secondary | ICD-10-CM | POA: Diagnosis not present

## 2012-01-04 DIAGNOSIS — L219 Seborrheic dermatitis, unspecified: Secondary | ICD-10-CM | POA: Diagnosis not present

## 2012-01-04 DIAGNOSIS — J210 Acute bronchiolitis due to respiratory syncytial virus: Secondary | ICD-10-CM | POA: Diagnosis not present

## 2012-01-04 DIAGNOSIS — D235 Other benign neoplasm of skin of trunk: Secondary | ICD-10-CM | POA: Diagnosis not present

## 2012-01-04 DIAGNOSIS — J392 Other diseases of pharynx: Secondary | ICD-10-CM | POA: Diagnosis not present

## 2012-01-04 NOTE — Telephone Encounter (Signed)
Please disregard previous telephone post. Wrong pt.

## 2012-01-04 NOTE — Telephone Encounter (Signed)
Received voicemail left on 01/04/12 at 9:35 AM. Pt wife, Neesha, requesting cancelling appointment today. Pt is not feeling well and is having multiple medical problems at this time. He has an appointment with his PCP today. Pt wife requesting discontinuing appointments until pt is more medically stable.  

## 2012-01-05 DIAGNOSIS — J343 Hypertrophy of nasal turbinates: Secondary | ICD-10-CM | POA: Diagnosis not present

## 2012-01-05 DIAGNOSIS — J069 Acute upper respiratory infection, unspecified: Secondary | ICD-10-CM | POA: Diagnosis not present

## 2012-01-05 DIAGNOSIS — Z6828 Body mass index (BMI) 28.0-28.9, adult: Secondary | ICD-10-CM | POA: Diagnosis not present

## 2012-02-07 ENCOUNTER — Other Ambulatory Visit: Payer: Self-pay | Admitting: Gastroenterology

## 2012-02-07 NOTE — Telephone Encounter (Signed)
Patient needs followup office visit GERD, nausea, epigastric pain and diarrhea

## 2012-02-08 NOTE — Telephone Encounter (Signed)
Pt is aware of OV on 4/22 at 1030 with KJ

## 2012-02-11 ENCOUNTER — Encounter: Payer: Self-pay | Admitting: Internal Medicine

## 2012-02-14 ENCOUNTER — Encounter: Payer: Self-pay | Admitting: Urgent Care

## 2012-02-14 ENCOUNTER — Ambulatory Visit (INDEPENDENT_AMBULATORY_CARE_PROVIDER_SITE_OTHER): Payer: Medicare Other | Admitting: Urgent Care

## 2012-02-14 VITALS — BP 121/74 | HR 88 | Temp 98.1°F | Ht 64.0 in | Wt 152.2 lb

## 2012-02-14 DIAGNOSIS — K219 Gastro-esophageal reflux disease without esophagitis: Secondary | ICD-10-CM | POA: Diagnosis not present

## 2012-02-14 DIAGNOSIS — R1310 Dysphagia, unspecified: Secondary | ICD-10-CM | POA: Insufficient documentation

## 2012-02-14 NOTE — Assessment & Plan Note (Signed)
Minimal problems with red, crackers, and pills. Refused EGD with esophageal dilation. She will contact us if she changes her mind or her dysphagia worsens.

## 2012-02-14 NOTE — Progress Notes (Signed)
Referring Provider: Cassell Smiles., MD Primary Care Physician:  Cassell Smiles., MD, MD Primary Gastroenterologist:  Dr. Jena Gauss  Chief Complaint  Patient presents with  . Follow-up  . Medication Refill   HPI:  Mary Hart is a 71 y.o. female here for follow up for GERD. She is complaining of sinus congestion for which she has an appt tomorrow w/ Dr Sherwood Gambler.  She has occasional globus & gas pressure in her chest with symptoms a couple days per week.  She takes Protonix 40 mg daily.  Denies N/V.  Appetite ok.  Occasional problems swallowing w/ pills, crackers, and breads.  Not significant enough at this time that she would like to pursue EGD with esophageal dilation. Denies odynophagia.  Wt up 18# in past yr.  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  She plans on starting to exercise again soon and trying to actively lose weight.   Past Medical History  Diagnosis Date  . GERD (gastroesophageal reflux disease)   . Hypothyroid   . Arthritis   . Neuropathy    Past Surgical History  Procedure Date  . Colonoscopy 06/20/2008    [facility] Dr. Damita Dunnings external hemorrhoids  . Anterior cervical fusion x 2   . Joint replacement 09/2010    Thumb  . Vesicovaginal fistula closure w/ tah   . Cholecystectomy   . Esophagogastroduodenoscopy 11/25/10    [facility]- tubular esophagus s/p of 56 dilator from a small HH/otherwise normal   Current Outpatient Prescriptions  Medication Sig Dispense Refill  . acyclovir (ZOVIRAX) 400 MG tablet Take 400 mg by mouth every 4 (four) hours while awake.        . Calcium Carbonate-Vitamin D (CALCIUM + D PO) Take by mouth.        . gabapentin (NEURONTIN) 300 MG capsule Take 300 mg by mouth 3 (three) times daily.        Marland Kitchen levothyroxine (SYNTHROID, LEVOTHROID) 25 MCG tablet Take 25 mcg by mouth daily.        . promethazine (PHENERGAN) 25 MG tablet Take 25 mg by mouth every 6 (six) hours as  needed.        Marland Kitchen PROTONIX 40 MG tablet TAKE ONE TABLET BY MOUTH ONCE DAILY.  31 each  5   Allergies as of 02/14/2012 - Review Complete 02/14/2012  Allergen Reaction Noted  . Codeine    . Hydromorphone hcl    . Morphine    . Moxifloxacin    . Sulfonamide derivatives    Review of Systems: Gen: Denies any fever, chills, sweats, anorexia, fatigue, weakness, malaise, weight loss, and sleep disorder. CV: Denies chest pain, angina, palpitations, syncope, orthopnea, PND, peripheral edema, and claudication. Resp: Denies dyspnea at rest, dyspnea with exercise, cough, sputum, wheezing, coughing up blood, and pleurisy. GI: Denies vomiting blood, jaundice, and fecal incontinence. Derm: Denies rash, itching, dry skin, hives, moles, warts, or unhealing ulcers.  Psych: Denies depression, anxiety, memory loss, suicidal ideation, hallucinations, paranoia, and confusion. Heme: Denies bruising, bleeding, and enlarged lymph nodes.  Physical Exam: BP 121/74  Pulse 88  Temp(Src) 98.1 F (36.7 C) (Temporal)  Ht 5\' 4"  (1.626 m)  Wt 152 lb 3.2 oz (69.037 kg)  BMI 26.12 kg/m2 General:   Alert,  Well-developed, well-nourished, pleasant and cooperative in NAD. Eyes:  Sclera clear, no icterus.   Conjunctiva pink. Mouth:  No deformity or lesions, oropharynx pink and moist. Neck:  Supple; no masses or thyromegaly. Heart:  Regular rate and rhythm; no murmurs, clicks, rubs,  or gallops. Abdomen:  Normal bowel sounds.  No bruits.  Soft, non-tender and non-distended without masses, hepatosplenomegaly or hernias noted.  No guarding or rebound tenderness.   Rectal:  Deferred. Msk:  Symmetrical without gross deformities.  Pulses:  Normal pulses noted. Extremities:  No clubbing or edema. Neurologic:  Alert and oriented x4;  grossly normal neurologically. Skin:  Intact without significant lesions or rashes.

## 2012-02-14 NOTE — Patient Instructions (Signed)
Stop Protonix Begin Dexilant 60mg  daily  If symptoms do not improve or your swallowing becomes worse and you would like your esophagus stretched, call us back  We have requested your colonoscopy report from [facility] to make further suggestions about when your next colonoscopy is due Follow with Dr. Carlena Sax tomorrow as planned about your sinus issues Office visit in one year or sooner if needed

## 2012-02-14 NOTE — Assessment & Plan Note (Addendum)
GERD with breakthrough globus & indigestion on protonix 40 mg daily. She has failed omeprazole 20 mg daily previously.  Stop Protonix Start Dexilant 60mg  daily If symptoms do not improve or your swallowing becomes worse and you would like your esophagus stretched, call us back   Follow with Dr. Carlena Sax tomorrow as planned about your sinus issues Office visit in one year or sooner if needed Wt loss encouraged

## 2012-02-15 DIAGNOSIS — Z6837 Body mass index (BMI) 37.0-37.9, adult: Secondary | ICD-10-CM | POA: Diagnosis not present

## 2012-02-15 DIAGNOSIS — J069 Acute upper respiratory infection, unspecified: Secondary | ICD-10-CM | POA: Diagnosis not present

## 2012-02-15 DIAGNOSIS — R05 Cough: Secondary | ICD-10-CM | POA: Diagnosis not present

## 2012-02-15 NOTE — Progress Notes (Signed)
Faxed to PCP

## 2012-02-21 ENCOUNTER — Encounter: Payer: Self-pay | Admitting: Internal Medicine

## 2012-03-10 DIAGNOSIS — Z6827 Body mass index (BMI) 27.0-27.9, adult: Secondary | ICD-10-CM | POA: Diagnosis not present

## 2012-03-10 DIAGNOSIS — W57XXXA Bitten or stung by nonvenomous insect and other nonvenomous arthropods, initial encounter: Secondary | ICD-10-CM | POA: Diagnosis not present

## 2012-03-10 DIAGNOSIS — R197 Diarrhea, unspecified: Secondary | ICD-10-CM | POA: Diagnosis not present

## 2012-03-10 DIAGNOSIS — T148 Other injury of unspecified body region: Secondary | ICD-10-CM | POA: Diagnosis not present

## 2012-03-13 ENCOUNTER — Other Ambulatory Visit (HOSPITAL_COMMUNITY): Payer: Self-pay | Admitting: Family Medicine

## 2012-03-13 ENCOUNTER — Ambulatory Visit (HOSPITAL_COMMUNITY)
Admission: RE | Admit: 2012-03-13 | Discharge: 2012-03-13 | Disposition: A | Discharge status: Home / Self Care | Payer: Medicare Other | Source: Ambulatory Visit | Attending: Family Medicine | Admitting: Family Medicine

## 2012-03-13 DIAGNOSIS — M25839 Other specified joint disorders, unspecified wrist: Secondary | ICD-10-CM | POA: Diagnosis not present

## 2012-03-13 DIAGNOSIS — M25579 Pain in unspecified ankle and joints of unspecified foot: Secondary | ICD-10-CM | POA: Diagnosis not present

## 2012-03-13 DIAGNOSIS — M778 Other enthesopathies, not elsewhere classified: Secondary | ICD-10-CM | POA: Diagnosis not present

## 2012-03-13 DIAGNOSIS — M25473 Effusion, unspecified ankle: Secondary | ICD-10-CM | POA: Diagnosis not present

## 2012-03-13 DIAGNOSIS — M25539 Pain in unspecified wrist: Secondary | ICD-10-CM | POA: Diagnosis not present

## 2012-03-13 DIAGNOSIS — M19039 Primary osteoarthritis, unspecified wrist: Secondary | ICD-10-CM | POA: Diagnosis not present

## 2012-03-13 DIAGNOSIS — M7989 Other specified soft tissue disorders: Secondary | ICD-10-CM | POA: Diagnosis not present

## 2012-03-13 DIAGNOSIS — M25476 Effusion, unspecified foot: Secondary | ICD-10-CM | POA: Insufficient documentation

## 2012-03-13 DIAGNOSIS — S93409A Sprain of unspecified ligament of unspecified ankle, initial encounter: Secondary | ICD-10-CM | POA: Diagnosis not present

## 2012-03-14 DIAGNOSIS — S8263XA Displaced fracture of lateral malleolus of unspecified fibula, initial encounter for closed fracture: Secondary | ICD-10-CM | POA: Diagnosis not present

## 2012-03-14 DIAGNOSIS — M19049 Primary osteoarthritis, unspecified hand: Secondary | ICD-10-CM | POA: Diagnosis not present

## 2012-03-28 DIAGNOSIS — S8263XA Displaced fracture of lateral malleolus of unspecified fibula, initial encounter for closed fracture: Secondary | ICD-10-CM | POA: Diagnosis not present

## 2012-03-30 DIAGNOSIS — S52599A Other fractures of lower end of unspecified radius, initial encounter for closed fracture: Secondary | ICD-10-CM | POA: Diagnosis not present

## 2012-03-31 DIAGNOSIS — D518 Other vitamin B12 deficiency anemias: Secondary | ICD-10-CM | POA: Diagnosis not present

## 2012-04-05 DIAGNOSIS — Z1231 Encounter for screening mammogram for malignant neoplasm of breast: Secondary | ICD-10-CM | POA: Diagnosis not present

## 2012-04-07 ENCOUNTER — Other Ambulatory Visit (HOSPITAL_COMMUNITY): Payer: Self-pay | Admitting: Internal Medicine

## 2012-04-07 DIAGNOSIS — Z0000 Encounter for general adult medical examination without abnormal findings: Secondary | ICD-10-CM

## 2012-04-07 DIAGNOSIS — E039 Hypothyroidism, unspecified: Secondary | ICD-10-CM | POA: Diagnosis not present

## 2012-04-07 DIAGNOSIS — J449 Chronic obstructive pulmonary disease, unspecified: Secondary | ICD-10-CM | POA: Diagnosis not present

## 2012-04-07 DIAGNOSIS — Z6827 Body mass index (BMI) 27.0-27.9, adult: Secondary | ICD-10-CM | POA: Diagnosis not present

## 2012-04-07 DIAGNOSIS — N390 Urinary tract infection, site not specified: Secondary | ICD-10-CM | POA: Diagnosis not present

## 2012-04-07 DIAGNOSIS — Z139 Encounter for screening, unspecified: Secondary | ICD-10-CM

## 2012-04-19 DIAGNOSIS — S52599A Other fractures of lower end of unspecified radius, initial encounter for closed fracture: Secondary | ICD-10-CM | POA: Diagnosis not present

## 2012-04-22 DIAGNOSIS — H35369 Drusen (degenerative) of macula, unspecified eye: Secondary | ICD-10-CM | POA: Diagnosis not present

## 2012-04-22 DIAGNOSIS — H353190 Nonexudative age-related macular degeneration, unspecified eye, stage unspecified: Secondary | ICD-10-CM | POA: Diagnosis not present

## 2012-04-22 DIAGNOSIS — Z961 Presence of intraocular lens: Secondary | ICD-10-CM | POA: Diagnosis not present

## 2012-04-22 DIAGNOSIS — H524 Presbyopia: Secondary | ICD-10-CM | POA: Diagnosis not present

## 2012-04-25 DIAGNOSIS — Z6826 Body mass index (BMI) 26.0-26.9, adult: Secondary | ICD-10-CM | POA: Diagnosis not present

## 2012-04-25 DIAGNOSIS — K219 Gastro-esophageal reflux disease without esophagitis: Secondary | ICD-10-CM | POA: Diagnosis not present

## 2012-04-26 DIAGNOSIS — S8263XA Displaced fracture of lateral malleolus of unspecified fibula, initial encounter for closed fracture: Secondary | ICD-10-CM | POA: Diagnosis not present

## 2012-04-30 ENCOUNTER — Encounter (HOSPITAL_COMMUNITY): Payer: Self-pay

## 2012-04-30 ENCOUNTER — Emergency Department (HOSPITAL_COMMUNITY): Payer: Medicare Other

## 2012-04-30 ENCOUNTER — Emergency Department (HOSPITAL_COMMUNITY)
Admission: EM | Admit: 2012-04-30 | Discharge: 2012-04-30 | Disposition: A | Discharge status: Home / Self Care | Payer: Medicare Other | Attending: Emergency Medicine | Admitting: Emergency Medicine

## 2012-04-30 DIAGNOSIS — F17200 Nicotine dependence, unspecified, uncomplicated: Secondary | ICD-10-CM | POA: Diagnosis not present

## 2012-04-30 DIAGNOSIS — K219 Gastro-esophageal reflux disease without esophagitis: Secondary | ICD-10-CM | POA: Insufficient documentation

## 2012-04-30 DIAGNOSIS — E039 Hypothyroidism, unspecified: Secondary | ICD-10-CM | POA: Diagnosis not present

## 2012-04-30 DIAGNOSIS — Z79899 Other long term (current) drug therapy: Secondary | ICD-10-CM | POA: Insufficient documentation

## 2012-04-30 DIAGNOSIS — K589 Irritable bowel syndrome without diarrhea: Secondary | ICD-10-CM | POA: Diagnosis not present

## 2012-04-30 DIAGNOSIS — F329 Major depressive disorder, single episode, unspecified: Secondary | ICD-10-CM | POA: Diagnosis not present

## 2012-04-30 DIAGNOSIS — R109 Unspecified abdominal pain: Secondary | ICD-10-CM

## 2012-04-30 DIAGNOSIS — Z8739 Personal history of other diseases of the musculoskeletal system and connective tissue: Secondary | ICD-10-CM | POA: Diagnosis not present

## 2012-04-30 DIAGNOSIS — R197 Diarrhea, unspecified: Secondary | ICD-10-CM | POA: Insufficient documentation

## 2012-04-30 DIAGNOSIS — N83209 Unspecified ovarian cyst, unspecified side: Secondary | ICD-10-CM | POA: Diagnosis not present

## 2012-04-30 DIAGNOSIS — F3289 Other specified depressive episodes: Secondary | ICD-10-CM | POA: Insufficient documentation

## 2012-04-30 DIAGNOSIS — R110 Nausea: Secondary | ICD-10-CM

## 2012-04-30 DIAGNOSIS — J9819 Other pulmonary collapse: Secondary | ICD-10-CM | POA: Diagnosis not present

## 2012-04-30 DIAGNOSIS — R1084 Generalized abdominal pain: Secondary | ICD-10-CM | POA: Diagnosis not present

## 2012-04-30 DIAGNOSIS — Z0389 Encounter for observation for other suspected diseases and conditions ruled out: Secondary | ICD-10-CM | POA: Diagnosis not present

## 2012-04-30 DIAGNOSIS — K5900 Constipation, unspecified: Secondary | ICD-10-CM | POA: Diagnosis not present

## 2012-04-30 HISTORY — DX: Depression, unspecified: F32.A

## 2012-04-30 HISTORY — DX: Irritable bowel syndrome, unspecified: K58.9

## 2012-04-30 HISTORY — DX: Major depressive disorder, single episode, unspecified: F32.9

## 2012-04-30 LAB — COMPREHENSIVE METABOLIC PANEL
ALT: 15 U/L (ref 0–35)
AST: 17 U/L (ref 0–37)
Albumin: 3.7 g/dL (ref 3.5–5.2)
Alkaline Phosphatase: 77 U/L (ref 39–117)
CO2: 23 mEq/L (ref 19–32)
Chloride: 103 mEq/L (ref 96–112)
Potassium: 3.4 mEq/L — ABNORMAL LOW (ref 3.5–5.1)
Sodium: 136 mEq/L (ref 135–145)
Total Bilirubin: 0.3 mg/dL (ref 0.3–1.2)

## 2012-04-30 LAB — OCCULT BLOOD, POC DEVICE: Fecal Occult Bld: NEGATIVE

## 2012-04-30 LAB — URINALYSIS, ROUTINE W REFLEX MICROSCOPIC
Bilirubin Urine: NEGATIVE
Glucose, UA: NEGATIVE mg/dL
Ketones, ur: NEGATIVE mg/dL
Nitrite: NEGATIVE
Protein, ur: NEGATIVE mg/dL
pH: 7.5 (ref 5.0–8.0)

## 2012-04-30 LAB — CBC WITH DIFFERENTIAL/PLATELET
Basophils Relative: 0 % (ref 0–1)
Eosinophils Absolute: 0.1 10*3/uL (ref 0.0–0.7)
Eosinophils Relative: 1 % (ref 0–5)
Lymphs Abs: 3 10*3/uL (ref 0.7–4.0)
MCH: 31.5 pg (ref 26.0–34.0)
MCHC: 33.6 g/dL (ref 30.0–36.0)
MCV: 93.7 fL (ref 78.0–100.0)
Platelets: 257 10*3/uL (ref 150–400)
RBC: 4.57 MIL/uL (ref 3.87–5.11)

## 2012-04-30 LAB — TROPONIN I: Troponin I: <0.3 ng/mL (ref <0.30)

## 2012-04-30 LAB — LIPASE, BLOOD: Lipase: 25 U/L (ref 11–59)

## 2012-04-30 MED ORDER — ONDANSETRON HCL 4 MG/2ML IJ SOLN
4.0000 mg | INTRAMUSCULAR | Status: DC | PRN
  Administered 2012-04-30: 4 mg via INTRAVENOUS
  Filled 2012-04-30: qty 2

## 2012-04-30 MED ORDER — FAMOTIDINE IN NACL 20-0.9 MG/50ML-% IV SOLN
20.0000 mg | Freq: Once | INTRAVENOUS | Status: AC
  Administered 2012-04-30: 20 mg via INTRAVENOUS
  Filled 2012-04-30: qty 50

## 2012-04-30 MED ORDER — IOHEXOL 300 MG/ML  SOLN
40.0000 mL | Freq: Once | INTRAMUSCULAR | Status: AC | PRN
  Administered 2012-04-30: 40 mL via ORAL

## 2012-04-30 MED ORDER — POTASSIUM CHLORIDE CRYS ER 20 MEQ PO TBCR
EXTENDED_RELEASE_TABLET | ORAL | Status: AC
  Administered 2012-04-30: 20:00:00
  Filled 2012-04-30: qty 2

## 2012-04-30 MED ORDER — SODIUM CHLORIDE 0.9 % IV SOLN
INTRAVENOUS | Status: DC
  Administered 2012-04-30: 18:00:00 via INTRAVENOUS

## 2012-04-30 MED ORDER — ONDANSETRON HCL 4 MG PO TABS: 4.0000 mg | ORAL_TABLET | Freq: Three times a day (TID) | ORAL | Status: AC | PRN

## 2012-04-30 MED ORDER — FENTANYL CITRATE 0.05 MG/ML IJ SOLN
50.0000 ug | INTRAMUSCULAR | Status: DC | PRN
  Filled 2012-04-30: qty 2

## 2012-04-30 MED ORDER — POTASSIUM CHLORIDE 20 MEQ/15ML (10%) PO LIQD: 40.0000 meq | Freq: Once | ORAL | Status: AC

## 2012-04-30 MED ORDER — IOHEXOL 300 MG/ML  SOLN
100.0000 mL | Freq: Once | INTRAMUSCULAR | Status: AC | PRN
  Administered 2012-04-30: 100 mL via INTRAVENOUS

## 2012-04-30 NOTE — ED Provider Notes (Signed)
History     CSN: [id number]  Arrival date & time 04/30/12  1706   First MD Initiated Contact with Patient 04/30/12 1715      Chief Complaint  Patient presents with  . Nausea  . Abdominal Pain  . Diarrhea    HPI Pt was seen at 1715.  Per pt, c/o gradual onset and persistence of constant nausea for the past 3 weeks.  Has been associated with lower abd "pain" and occasional episodes of vomiting.  States the abd pain radiates into her back.  Endorses having "diarrhea" for the past 2 days.  Describes her diarrhea as "watery" yesterday, but "just loose stool" today (improving).  States she was eval by her PMD x2 for same, rx abx for a presumed UTI, but does not feel improved.  Denies fevers, no dysuria/hematuria, no black or blood in stools or emesis, no vaginal bleeding/discharge, no CP/SOB, no cough.      PMD:  [facility]:  [facility] Past Medical History  Diagnosis Date  . GERD (gastroesophageal reflux disease)   . Hypothyroid   . Arthritis   . Neuropathy   . Depression   . IBS (irritable bowel syndrome)     Past Surgical History  Procedure Date  . Colonoscopy 06/20/2008    [facility] Dr. Damita Dunnings external hemorrhoids  . Anterior cervical fusion x 2   . Joint replacement 09/2010    Thumb  . Vesicovaginal fistula closure w/ tah   . Cholecystectomy   . Esophagogastroduodenoscopy 11/25/10    Rourk- tubular esophagus s/p of 56 dilator from a small HH/otherwise normal  . Abdominal hysterectomy     History  Substance Use Topics  . Smoking status: Current Everyday Smoker -- 0.5 packs/day for 50 years    Types: Cigarettes  . Smokeless tobacco: Not on file  . Alcohol Use: Yes    Review of Systems ROS: Statement: All systems negative except as marked or noted in the HPI; Constitutional: Negative for fever and chills. ; ; Eyes: Negative for eye pain, redness and discharge. ; ; ENMT: Negative for ear pain, hoarseness, nasal congestion, sinus pressure and sore throat. ; ; Cardiovascular:  Negative for chest pain, palpitations, diaphoresis, dyspnea and peripheral edema. ; ; Respiratory: Negative for cough, wheezing and stridor. ; ; Gastrointestinal: +N/V/D, abd pain. Negative for blood in stool, hematemesis, jaundice and rectal bleeding. . ; ; Genitourinary: Negative for dysuria, flank pain and hematuria. ; ; Musculoskeletal: Negative for back pain and neck pain. Negative for swelling and trauma.; ; Skin: Negative for pruritus, rash, abrasions, blisters, bruising and skin lesion.; ; Neuro: Negative for headache, lightheadedness and neck stiffness. Negative for weakness, altered level of consciousness , altered mental status, extremity weakness, paresthesias, involuntary movement, seizure and syncope.     Allergies  Codeine; Hydromorphone hcl; Morphine; Moxifloxacin; and Sulfonamide derivatives  Home Medications   Current Outpatient Rx  Name Route Sig Dispense Refill  . ACYCLOVIR 400 MG PO TABS Oral Take 400 mg by mouth daily.     Marland Kitchen CALCIUM + D PO Oral Take by mouth.      . DEXLANSOPRAZOLE 60 MG PO CPDR Oral Take 60 mg by mouth daily.    Marland Kitchen FAMOTIDINE 20 MG PO TABS Oral Take 20 mg by mouth 2 (two) times daily.    Marland Kitchen GABAPENTIN 300 MG PO CAPS Oral Take 300-600 mg by mouth 2 (two) times daily. Patient takes 1 capsule in the morning and 2 capsules at bedtime    . LEVOTHYROXINE SODIUM  25 MCG PO TABS Oral Take 25 mcg by mouth daily.      Marland Kitchen NITROFURANTOIN MONOHYD MACRO 100 MG PO CAPS Oral Take 100 mg by mouth 2 (two) times daily.    Marland Kitchen PROMETHAZINE HCL 25 MG PO TABS Oral Take 25 mg by mouth every 6 (six) hours as needed.      Marland Kitchen PROTONIX 40 MG PO TBEC  TAKE ONE TABLET BY MOUTH ONCE DAILY. 31 each 5  . VITAMIN C 500 MG PO TABS Oral Take 500 mg by mouth daily.      BP 130/54  Pulse 60  Temp 97.9 F (36.6 C) (Oral)  Resp 20  Ht 5\' 4"  (1.626 m)  Wt 148 lb (67.132 kg)  BMI 25.40 kg/m2  SpO2 98%  Physical Exam 1720: Physical examination:  Nursing notes reviewed; Vital signs and O2 SAT  reviewed;  Constitutional: Well developed, Well nourished, In no acute distress; Head:  Normocephalic, atraumatic; Eyes: EOMI, PERRL, No scleral icterus; ENMT: Mouth and pharynx normal, Mucous membranes dry; Neck: Supple, Full range of motion, No lymphadenopathy; Cardiovascular: Regular rate and rhythm, No murmur, rub, or gallop; Respiratory: Breath sounds clear & equal bilaterally, No rales, rhonchi, wheezes.  Speaking full sentences with ease, Normal respiratory effort/excursion; Chest: Nontender, Movement normal; Abdomen: Soft, +diffusely tender to palp, Nondistended, Normal bowel sounds; Rectal exam performed w/permission of pt and ED RN chaparone present.  Anal tone normal.  Non-tender, soft brown stool in rectal vault, heme neg.  No fissures, no external hemorrhoids, no palp masses.;; Genitourinary: No CVA tenderness; Extremities: Pulses normal, No tenderness, No edema, No calf edema or asymmetry.; Neuro: AA&Ox3, Major CN grossly intact.  Speech clear. No gross focal motor or sensory deficits in extremities.; Skin: Color normal, Warm, Dry.   ED Course  Procedures   MDM  MDM Reviewed: nursing note, previous chart and vitals Reviewed previous: ECG Interpretation: ECG, labs, x-ray and CT scan      Date: 04/30/2012  Rate: 60  Rhythm: normal sinus rhythm  QRS Axis: normal  Intervals: normal  ST/T Wave abnormalities: normal  Conduction Disutrbances:none  Narrative Interpretation:   Old EKG Reviewed: unchanged; no significant changes from previous EKG dated 06/28/2010.   Results for orders placed during the hospital encounter of 04/30/12  OCCULT BLOOD, POC DEVICE      Component Value Range   Fecal Occult Bld NEGATIVE    URINALYSIS, ROUTINE W REFLEX MICROSCOPIC      Component Value Range   Color, Urine YELLOW  YELLOW   APPearance CLEAR  CLEAR   Specific Gravity, Urine 1.010  1.005 - 1.030   pH 7.5  5.0 - 8.0   Glucose, UA NEGATIVE  NEGATIVE mg/dL   Hgb urine dipstick NEGATIVE   NEGATIVE   Bilirubin Urine NEGATIVE  NEGATIVE   Ketones, ur NEGATIVE  NEGATIVE mg/dL   Protein, ur NEGATIVE  NEGATIVE mg/dL   Urobilinogen, UA 0.2  0.0 - 1.0 mg/dL   Nitrite NEGATIVE  NEGATIVE   Leukocytes, UA NEGATIVE  NEGATIVE  TROPONIN I      Component Value Range   Troponin I <0.30  <0.30 ng/mL  COMPREHENSIVE METABOLIC PANEL      Component Value Range   Sodium 136  135 - 145 mEq/L   Potassium 3.4 (*) 3.5 - 5.1 mEq/L   Chloride 103  96 - 112 mEq/L   CO2 23  19 - 32 mEq/L   Glucose, Bld 72  70 - 99 mg/dL   BUN 7  6 - 23 mg/dL   Creatinine, Ser 1.61  0.50 - 1.10 mg/dL   Calcium 9.3  8.4 - 09.6 mg/dL   Total Protein 6.5  6.0 - 8.3 g/dL   Albumin 3.7  3.5 - 5.2 g/dL   AST 17  0 - 37 U/L   ALT 15  0 - 35 U/L   Alkaline Phosphatase 77  39 - 117 U/L   Total Bilirubin 0.3  0.3 - 1.2 mg/dL   GFR calc non Af Amer 89 (*) >90 mL/min   GFR calc Af Amer >90  >90 mL/min  CBC WITH DIFFERENTIAL      Component Value Range   WBC 7.0  4.0 - 10.5 K/uL   RBC 4.57  3.87 - 5.11 MIL/uL   Hemoglobin 14.4  12.0 - 15.0 g/dL   HCT 04.5  40.9 - 81.1 %   MCV 93.7  78.0 - 100.0 fL   MCH 31.5  26.0 - 34.0 pg   MCHC 33.6  30.0 - 36.0 g/dL   RDW 91.4  78.2 - 95.6 %   Platelets 257  150 - 400 K/uL   Neutrophils Relative 47  43 - 77 %   Neutro Abs 3.3  1.7 - 7.7 K/uL   Lymphocytes Relative 42  12 - 46 %   Lymphs Abs 3.0  0.7 - 4.0 K/uL   Monocytes Relative 9  3 - 12 %   Monocytes Absolute 0.7  0.1 - 1.0 K/uL   Eosinophils Relative 1  0 - 5 %   Eosinophils Absolute 0.1  0.0 - 0.7 K/uL   Basophils Relative 0  0 - 1 %   Basophils Absolute 0.0  0.0 - 0.1 K/uL  LIPASE, BLOOD      Component Value Range   Lipase 25  11 - 59 U/L   Dg Chest 2 View 04/30/2012  *RADIOLOGY REPORT*  Clinical Data: Cough  CHEST - 2 VIEW  Comparison: 06/28/2010  Findings: Heart is normal in size.  Mild bronchitic changes. Linear atelectasis at the left base.  Left apical pleural calcifications are chronic.  No new consolidation  or mass.  No pneumothorax.  IMPRESSION: Linear atelectasis at the left base.  Original Report Authenticated By: Donavan Burnet, M.D.   Ct Abdomen Pelvis W Contrast 04/30/2012  *RADIOLOGY REPORT*  Clinical Data: Abdominal pain.  Nausea.  CT ABDOMEN AND PELVIS WITH CONTRAST  Technique:  Multidetector CT imaging of the abdomen and pelvis was performed following the standard protocol during bolus administration of intravenous contrast.  Contrast: OMNIPAQUE IOHEXOL 300 MG/ML  SOLN  Comparison: 08/22/2009.  Findings: Dependent atelectasis at the lung bases.  Liver normal. Cholecystectomy.  Cholecystectomy clips.  Post cholecystectomy dilation of the common bile duct.  Pancreas appears normal.  Spleen normal.  Hysterectomy.  Right adnexal cyst has been previously evaluated with ultrasound.  Urinary bladder appears normal.  Normal renal enhancement.  Normal delayed excretion of contrast in the kidneys.  The adrenal glands appear normal.  The stomach is normal. Fecalization of terminal ileum without obstruction.  Large stool burden present in the cecum.  The distal colon is decompressed.  There is mild mural thickening of the descending colon which is probably secondary to decompression however mild colitis cannot be excluded.  Rectum appears within normal limits.  No free fluid in the anatomic pelvis.  No aggressive osseous lesions.  Aortic atherosclerosis. Calcified granuloma in the left gluteal fat. Lumbar spondylosis is most pronounced at L4-L5.  IMPRESSION: 1.  Decompressed descending and sigmoid colon.  There may be mild mural thickening which is difficult to evaluate based on decompression.  Mild colitis cannot be excluded. 2.  Prominent stool burden in the right abdomen. 3.  Hysterectomy and cholecystectomy. 4.  Previously evaluated right ovarian cyst remains present.  Today this cyst measures 37 mm x 25 mm.  Original Report Authenticated By: Andreas Newport, M.D.      8:10 PM:  Pt has tol PO well while in  the ED without N/V.  Potassium repleted PO.  No stooling while in the ED.  CT with likely decompressed descending colon, doubt colitis without surrounding inflammatory changes, normal WBC, and afebrile.  Pt now endorses hx of IBS, with these symptoms today similar to her IBS "episodes."  Wants to go home now.  Requesting zofran rx for her nausea, as she feels this works better for her nausea than the phenergan she has.  Dx testing d/w pt and family.  Questions answered.  Verb understanding, agreeable to d/c home with outpt f/u with her PMD and GI MD.        Laray Anger, DO 05/02/12 [phone]

## 2012-04-30 NOTE — ED Notes (Signed)
Complain of nausea for weeks.

## 2012-04-30 NOTE — ED Notes (Signed)
Patient transported to CT 

## 2012-04-30 NOTE — ED Notes (Signed)
Assisted pt to BR and back to bed without difficultly, diet coke given for oral trial, pt verbalizes understanding

## 2012-05-01 ENCOUNTER — Telehealth: Payer: Self-pay | Admitting: Internal Medicine

## 2012-05-01 NOTE — Telephone Encounter (Signed)
Patient called this morning and said that she is having really bad nausea. She has been to her PCP several times and to the ER and she is getting no relief. Please advise if I can use an Urgent spot since the next available will be July 22 with extenders.

## 2012-05-01 NOTE — Telephone Encounter (Signed)
Pt aware of appointment 

## 2012-05-01 NOTE — Telephone Encounter (Signed)
You can use my 11:30 on Thursday.

## 2012-05-01 NOTE — Telephone Encounter (Signed)
Pt is aware of OV with [facility] on 7/11 at 1130

## 2012-05-01 NOTE — Telephone Encounter (Signed)
Called Pt she is taking half a tablet of the Phenergan for the nausea. She just started taking the Dexilant on July 3. The ER said that she needed to follow up with [place]. Please advise

## 2012-05-03 LAB — URINE CULTURE

## 2012-05-04 ENCOUNTER — Ambulatory Visit (INDEPENDENT_AMBULATORY_CARE_PROVIDER_SITE_OTHER): Payer: Medicare Other | Admitting: Gastroenterology

## 2012-05-04 ENCOUNTER — Encounter: Payer: Self-pay | Admitting: Gastroenterology

## 2012-05-04 ENCOUNTER — Other Ambulatory Visit: Payer: Self-pay | Admitting: Gastroenterology

## 2012-05-04 VITALS — BP 112/68 | HR 84 | Temp 98.2°F | Ht 64.0 in | Wt 151.2 lb

## 2012-05-04 DIAGNOSIS — K219 Gastro-esophageal reflux disease without esophagitis: Secondary | ICD-10-CM

## 2012-05-04 DIAGNOSIS — R110 Nausea: Secondary | ICD-10-CM

## 2012-05-04 DIAGNOSIS — K5900 Constipation, unspecified: Secondary | ICD-10-CM | POA: Diagnosis not present

## 2012-05-04 DIAGNOSIS — K589 Irritable bowel syndrome without diarrhea: Secondary | ICD-10-CM

## 2012-05-04 DIAGNOSIS — S8263XA Displaced fracture of lateral malleolus of unspecified fibula, initial encounter for closed fracture: Secondary | ICD-10-CM | POA: Diagnosis not present

## 2012-05-04 MED ORDER — POLYETHYLENE GLYCOL 3350 17 GM/SCOOP PO POWD: 17.0000 g | Freq: Every day | ORAL | Status: AC

## 2012-05-04 NOTE — Patient Instructions (Signed)
  Please continue the Dexilant one 30 minutes before breakfast daily. #30 samples provided today. We will check with your insurance company to find out what your coverage is regarding these types of medications.  Please start Miralax daily. Titrate from 1/2 to 1 capful daily as needed to keep your bowels regular but without causing diarrhea.  We have scheduled you for a gastric emptying study with oatmeal.

## 2012-05-04 NOTE — Progress Notes (Signed)
Faxed to PCP

## 2012-05-04 NOTE — Assessment & Plan Note (Addendum)
Several week h/o intermittent nausea. Looking back in records, we have evaluated her for this before. Symptoms often associated with diarrhea. ?IBS vs nausea with constipation vs GERD vs gastroparesis. She denies significant stress around taking care of spouse but admits taking care of mother at times will cause her to have diarrhea with stress. CT exam impressive with amount of stool load in right colon. Patient reports having diarrhea prior to CT. Mild mural wall thickening of descending colon vs related to decompression. Last TCS by Dr. Karilyn Cota in 2009. Will address further with Dr. Jena Gauss.   Please continue the Dexilant one 30 minutes before breakfast daily. #30 samples provided today. We will check with your insurance company to find out what your coverage is regarding these types of medications.  Please start Miralax daily. Titrate from 1/2 to 1 capful daily as needed to keep your bowels regular but without causing diarrhea.  We have scheduled you for a gastric emptying study with oatmeal.

## 2012-05-04 NOTE — Progress Notes (Signed)
Primary Care Physician: Cassell Smiles., MD  Primary Gastroenterologist:  Roetta Sessions, MD   Chief Complaint  Patient presents with  . Nausea    HPI: Mary Hart is a 71 y.o. female here for urgent office visit for protracted nausea. She has h/o GERD, intermittent nausea and diarrhea. H/O IBS per patient.   5 weeks intermittent nausea. First PCP visit following two deer ticks exposures. ?tick borne illness but blood work negative. States nausea usually first thing in the morning. Never with vomiting. Sometimes worse with meals. May have one week in between episodes with no nausea. Usually has diarrhea with nausea. Second PCP visit, suprapubic burning. U/A showed blood in urine. Given cipro for 7 days. One week ago, woke up with severe nausea. Third PCP visit. Checked urine, no blood or infection. PCP thought symptoms may be GERD related. At last OV here, we tried to switch her from pantoprazole to Dexilant.  Took dexilant samples but could not afford copay so stayed on pantoprazole. Last week at PCP, given Dexilant samples. Did well for 3-4 days but Saturday am woke up with nausea/diarrhea. Took phenergan all day. Sunday morning, nausea, took 1/2 phenergan, went to church. Got home at 930am, took another 1/2 of phenergan. As day went on, symptoms got worse, developed llq/left mid abd pain. No fever. Went to ED. Labs and CT as below. No nausea this week in am. Tuesday, after supper, started having nausea and left sided abd pain. Some nausea last night.  If doesn't take fiber then may have some constipation.  Sat watery stools. Sun morning watery stools. Sunday later in the day, ribbon like stools. Did have worse heartburn symptoms but better on Dexilant. Also takes OTC generic cid reducer at bedtime for past 3 weeks ago).    Since last ov, broke right ankle and right wrist.    Past Surgical History  Procedure Date  . Colonoscopy 06/20/2008    [facility] Dr. Damita Dunnings external hemorrhoids                     . Esophagogastroduodenoscopy 11/25/10    Rourk- tubular esophagus s/p of 56 dilator from a small HH/otherwise normal             Current Outpatient Prescriptions  Medication Sig Dispense Refill  . acyclovir (ZOVIRAX) 400 MG tablet Take 400 mg by mouth daily.       . Calcium Carbonate-Vitamin D (CALCIUM + D PO) Take by mouth.        . dexlansoprazole (DEXILANT) 60 MG capsule Take 60 mg by mouth daily.      Marland Kitchen gabapentin (NEURONTIN) 300 MG capsule Take 300-600 mg by mouth 2 (two) times daily. Patient takes 1 capsule in the morning and 2 capsules at bedtime      . HYDROcodone-acetaminophen (VICODIN) 5-500 MG per tablet Take 1 tablet by mouth every 6 (six) hours as needed.      . Lactobacillus (ACIDOPHILUS PO) Take 1 mg by mouth 2 (two) times daily.      Marland Kitchen levothyroxine (SYNTHROID, LEVOTHROID) 25 MCG tablet Take 25 mcg by mouth daily.        . ondansetron (ZOFRAN) 4 MG tablet Take 1 tablet (4 mg total) by mouth every 8 (eight) hours as needed for nausea.  6 tablet  0  . promethazine (PHENERGAN) 25 MG tablet Take 25 mg by mouth every 6 (six) hours as needed.        . vitamin C (ASCORBIC ACID) 500  MG tablet Take 500 mg by mouth daily.      . Wheat Dextrin (BENEFIBER DRINK MIX PO) Take by mouth.        Allergies as of 05/04/2012 - Review Complete 05/04/2012  Allergen Reaction Noted  . Codeine    . Hydromorphone hcl    . Morphine    . Moxifloxacin    . Sulfonamide derivatives      ROS:  General: Negative for weight loss, fever, chills, fatigue, weakness. Appetite not as good. ENT: Negative for hoarseness, difficulty swallowing , nasal congestion. CV: Negative for chest pain, angina, palpitations, dyspnea on exertion, peripheral edema.  Respiratory: Negative for dyspnea at rest, dyspnea on exertion, cough, sputum, wheezing.  GI: See history of present illness. GU:  Negative for dysuria, hematuria, urinary incontinence, urinary frequency, nocturnal urination. See  hpi. Endo: Negative for unusual weight change.    Physical Examination:   BP 112/68  Pulse 84  Temp 98.2 F (36.8 C) (Temporal)  Ht 5\' 4"  (1.626 m)  Wt 151 lb 3.2 oz (68.584 kg)  BMI 25.95 kg/m2  General: Well-nourished, well-developed in no acute distress.  Eyes: No icterus. Mouth: Oropharyngeal mucosa moist and pink , no lesions erythema or exudate. Lungs: Clear to auscultation bilaterally.  Heart: Regular rate and rhythm, no murmurs rubs or gallops.  Abdomen: Bowel sounds are normal, nontender, nondistended, no hepatosplenomegaly or masses, no abdominal bruits or hernia , no rebound or guarding.   Extremities: No lower extremity edema. No clubbing or deformities. Neuro: Alert and oriented x 4   Skin: Warm and dry, no jaundice.   Psych: Alert and cooperative, normal mood and affect.  Labs:  Lab Results  Component Value Date   WBC 7.0 04/30/2012   HGB 14.4 04/30/2012   HCT 42.8 04/30/2012   MCV 93.7 04/30/2012   PLT 257 04/30/2012   Lab Results  Component Value Date   LIPASE 25 04/30/2012   Lab Results  Component Value Date   ALT 15 04/30/2012   AST 17 04/30/2012   ALKPHOS 77 04/30/2012   BILITOT 0.3 04/30/2012   Lab Results  Component Value Date   CREATININE 0.65 04/30/2012   BUN 7 04/30/2012   NA 136 04/30/2012   K 3.4* 04/30/2012   CL 103 04/30/2012   CO2 23 04/30/2012   Heme negative on DRE in ED. Catheterized urine culture OK.  Imaging Studies: Dg Chest 2 View  04/30/2012  *RADIOLOGY REPORT*  Clinical Data: Cough  CHEST - 2 VIEW  Comparison: 06/28/2010  Findings: Heart is normal in size.  Mild bronchitic changes. Linear atelectasis at the left base.  Left apical pleural calcifications are chronic.  No new consolidation or mass.  No pneumothorax.  IMPRESSION: Linear atelectasis at the left base.  Original Report Authenticated By: Donavan Burnet, M.D.   Ct Abdomen Pelvis W Contrast  04/30/2012  *RADIOLOGY REPORT*  Clinical Data: Abdominal pain.  Nausea.  CT ABDOMEN AND PELVIS WITH  CONTRAST  Technique:  Multidetector CT imaging of the abdomen and pelvis was performed following the standard protocol during bolus administration of intravenous contrast.  Contrast: OMNIPAQUE IOHEXOL 300 MG/ML  SOLN  Comparison: 08/22/2009.  Findings: Dependent atelectasis at the lung bases.  Liver normal. Cholecystectomy.  Cholecystectomy clips.  Post cholecystectomy dilation of the common bile duct.  Pancreas appears normal.  Spleen normal.  Hysterectomy.  Right adnexal cyst has been previously evaluated with ultrasound.  Urinary bladder appears normal.  Normal renal enhancement.  Normal delayed excretion  of contrast in the kidneys.  The adrenal glands appear normal.  The stomach is normal. Fecalization of terminal ileum without obstruction.  Large stool burden present in the cecum.  The distal colon is decompressed.  There is mild mural thickening of the descending colon which is probably secondary to decompression however mild colitis cannot be excluded.  Rectum appears within normal limits.  No free fluid in the anatomic pelvis.  No aggressive osseous lesions.  Aortic atherosclerosis. Calcified granuloma in the left gluteal fat. Lumbar spondylosis is most pronounced at L4-L5.  IMPRESSION: 1.  Decompressed descending and sigmoid colon.  There may be mild mural thickening which is difficult to evaluate based on decompression.  Mild colitis cannot be excluded. 2.  Prominent stool burden in the right abdomen. 3.  Hysterectomy and cholecystectomy. 4.  Previously evaluated right ovarian cyst remains present.  Today this cyst measures 37 mm x 25 mm.  Original Report Authenticated By: Andreas Newport, M.D.

## 2012-05-08 DIAGNOSIS — H35419 Lattice degeneration of retina, unspecified eye: Secondary | ICD-10-CM | POA: Diagnosis not present

## 2012-05-08 DIAGNOSIS — Z961 Presence of intraocular lens: Secondary | ICD-10-CM | POA: Diagnosis not present

## 2012-05-08 DIAGNOSIS — H35379 Puckering of macula, unspecified eye: Secondary | ICD-10-CM | POA: Diagnosis not present

## 2012-05-08 DIAGNOSIS — H35369 Drusen (degenerative) of macula, unspecified eye: Secondary | ICD-10-CM | POA: Diagnosis not present

## 2012-05-09 DIAGNOSIS — S8263XA Displaced fracture of lateral malleolus of unspecified fibula, initial encounter for closed fracture: Secondary | ICD-10-CM | POA: Diagnosis not present

## 2012-05-10 ENCOUNTER — Encounter (HOSPITAL_COMMUNITY): Payer: Self-pay

## 2012-05-10 ENCOUNTER — Encounter (HOSPITAL_COMMUNITY)
Admission: RE | Admit: 2012-05-10 | Discharge: 2012-05-10 | Disposition: A | Discharge status: Home / Self Care | Payer: Medicare Other | Source: Ambulatory Visit | Attending: Gastroenterology | Admitting: Gastroenterology

## 2012-05-10 DIAGNOSIS — K5900 Constipation, unspecified: Secondary | ICD-10-CM

## 2012-05-10 DIAGNOSIS — R109 Unspecified abdominal pain: Secondary | ICD-10-CM | POA: Diagnosis not present

## 2012-05-10 DIAGNOSIS — K589 Irritable bowel syndrome without diarrhea: Secondary | ICD-10-CM | POA: Diagnosis not present

## 2012-05-10 DIAGNOSIS — R110 Nausea: Secondary | ICD-10-CM | POA: Diagnosis not present

## 2012-05-10 DIAGNOSIS — K219 Gastro-esophageal reflux disease without esophagitis: Secondary | ICD-10-CM | POA: Diagnosis not present

## 2012-05-10 MED ORDER — TECHNETIUM TC 99M SULFUR COLLOID
2.0000 | Freq: Once | INTRAVENOUS | Status: AC | PRN
  Administered 2012-05-10: 2.1 via ORAL

## 2012-05-11 DIAGNOSIS — M25579 Pain in unspecified ankle and joints of unspecified foot: Secondary | ICD-10-CM | POA: Diagnosis not present

## 2012-05-12 NOTE — Progress Notes (Signed)
Quick Note:  GES normal. Please find out how patient's nausea and constipation are.  I still need to find out if RMR wants to do TCS based on CT findings. ______

## 2012-05-15 ENCOUNTER — Telehealth: Payer: Self-pay | Admitting: *Deleted

## 2012-05-15 NOTE — Telephone Encounter (Signed)
Spoke with pt

## 2012-05-15 NOTE — Telephone Encounter (Signed)
Mary Hart called today to see if we have the results of her tests from last week. She is nauseated and having a lot of diarrhea. She would like a call back.

## 2012-05-17 DIAGNOSIS — M25579 Pain in unspecified ankle and joints of unspecified foot: Secondary | ICD-10-CM | POA: Diagnosis not present

## 2012-05-19 DIAGNOSIS — M25579 Pain in unspecified ankle and joints of unspecified foot: Secondary | ICD-10-CM | POA: Diagnosis not present

## 2012-05-23 ENCOUNTER — Telehealth: Payer: Self-pay | Admitting: *Deleted

## 2012-05-23 DIAGNOSIS — M25579 Pain in unspecified ankle and joints of unspecified foot: Secondary | ICD-10-CM | POA: Diagnosis not present

## 2012-05-23 NOTE — Telephone Encounter (Signed)
Discussed with Dr. Jena Gauss. He recommends Golytely purge followed by repeat CT A/P with contrast rather than colonoscopy.  Please request patient take golytely (over four hours). Schedule for repeat CT A/P with contrast to f/u on abnormal colon findings.

## 2012-05-23 NOTE — Telephone Encounter (Signed)
Spoke with pt- she is still having nausea. Friday she was having problems with constipation. She had to take her miralax, fiber and dulcolax all weekend and finally had a good bm yesterday. She stated the nausea seems to get worse when she is constipated. She is wanting to know what the next step is. Please advise.

## 2012-05-23 NOTE — Telephone Encounter (Signed)
Mary Hart called today. She is still getting no relief and is wanting to know what the next step for her is.  Please follow up with her. Thanks.

## 2012-05-24 ENCOUNTER — Telehealth: Payer: Self-pay | Admitting: *Deleted

## 2012-05-24 NOTE — Telephone Encounter (Signed)
Mary Hart called today. She is awaiting a phone call back from you. Thanks.

## 2012-05-24 NOTE — Telephone Encounter (Signed)
Tried to call pt- LMOM 

## 2012-05-24 NOTE — Telephone Encounter (Signed)
Pt was returning JL's call from earlier. I told Mrs. Sinha that I would have nurse call her back. [phone]

## 2012-05-24 NOTE — Telephone Encounter (Signed)
Pt is aware of recommendations

## 2012-05-24 NOTE — Telephone Encounter (Signed)
Pt aware, ok to set up ct

## 2012-05-25 ENCOUNTER — Other Ambulatory Visit: Payer: Self-pay | Admitting: Gastroenterology

## 2012-05-25 DIAGNOSIS — R933 Abnormal findings on diagnostic imaging of other parts of digestive tract: Secondary | ICD-10-CM

## 2012-05-25 MED ORDER — PEG 3350-KCL-NA BICARB-NACL 420 G PO SOLR: ORAL | Status: AC

## 2012-05-25 MED ORDER — PEG-KCL-NACL-NASULF-NA ASC-C 100 G PO SOLR: 1.0000 | Freq: Once | ORAL | Status: DC

## 2012-05-25 NOTE — Telephone Encounter (Signed)
Ms. Riolo is scheduled for CT A/P w/cm and she will be on Golytely purge day before instructions will be picked up here at the office by Ms. Deweese and she is aware of it.

## 2012-06-01 ENCOUNTER — Ambulatory Visit (HOSPITAL_COMMUNITY)
Admission: RE | Admit: 2012-06-01 | Discharge: 2012-06-01 | Disposition: A | Discharge status: Home / Self Care | Payer: Medicare Other | Source: Ambulatory Visit | Attending: Gastroenterology | Admitting: Gastroenterology

## 2012-06-01 DIAGNOSIS — N83209 Unspecified ovarian cyst, unspecified side: Secondary | ICD-10-CM | POA: Diagnosis not present

## 2012-06-01 DIAGNOSIS — R109 Unspecified abdominal pain: Secondary | ICD-10-CM | POA: Insufficient documentation

## 2012-06-01 DIAGNOSIS — R933 Abnormal findings on diagnostic imaging of other parts of digestive tract: Secondary | ICD-10-CM | POA: Diagnosis not present

## 2012-06-01 MED ORDER — IOHEXOL 300 MG/ML  SOLN
100.0000 mL | Freq: Once | INTRAMUSCULAR | Status: AC | PRN
  Administered 2012-06-01: 100 mL via INTRAVENOUS

## 2012-06-02 ENCOUNTER — Telehealth: Payer: Self-pay | Admitting: Internal Medicine

## 2012-06-02 NOTE — Telephone Encounter (Signed)
Spoke to patient's husband last night about another matter and he related his wife and still been having some problems.  I have reviewed the chart and gone to radiology and reviewed the last 2 abdominal CAT scans with Dr. Kearney Hard. They look good. Nothing to suggest neoplasm or inflammatory  process or ischemia. Question of possible colitis raised previously by interpreting radiologist. After review today, it is felt that her colon really looked pretty much normal on both of the last 2 studies (aside from constipation). Gastric empty study also looked really looked good.   I called patient today and reviewed her symptoms. She tells me she is figured out that the nausea is highly correlated with periods of constipation lasting 4-5 days. Takes Benefiber irregularly and MiraLax as needed. After some discussion, it appears that she gets a little too far behind with bowel function and doesn't start MiraLax soon enough to get the desired effect.  I recommended she increase Benefiber from 2 teaspoons daily to 2 tablespoons daily. Also, I recommended she take MiraLax as a matter of routine 17 g orally each morning. I would hold MiraLax on any given day only if she has significant diarrhea-not just a little loose stool.   Also, said she can't tell any difference with her reflux symptoms on pantoprazole or Dexilant (both agents work well as far as controlling reflux symptoms are concerned.) I told her it was okay to go back on pantoprazole 40 mg orally daily for cost effectiveness. We'll arrange a followup appointment with me in 4-6 weeks in the office.

## 2012-06-05 NOTE — Progress Notes (Signed)
Quick Note:  See Dr. Luvenia Starch telephone note from 06/02/12.  Recommend she f/u with gyn regarding right ovarian cyst (appears stable). ______

## 2012-06-06 NOTE — Telephone Encounter (Signed)
I'm waiting for the Sept schedule to be released before calling patient

## 2012-06-06 NOTE — Telephone Encounter (Signed)
Pt's husband is aware of OV on 9/6 @ 0930 with [facility].

## 2012-06-29 DIAGNOSIS — M949 Disorder of cartilage, unspecified: Secondary | ICD-10-CM | POA: Diagnosis not present

## 2012-06-29 DIAGNOSIS — M899 Disorder of bone, unspecified: Secondary | ICD-10-CM | POA: Diagnosis not present

## 2012-06-29 DIAGNOSIS — Z01419 Encounter for gynecological examination (general) (routine) without abnormal findings: Secondary | ICD-10-CM | POA: Diagnosis not present

## 2012-06-30 ENCOUNTER — Ambulatory Visit (INDEPENDENT_AMBULATORY_CARE_PROVIDER_SITE_OTHER): Payer: Medicare Other | Admitting: Internal Medicine

## 2012-06-30 ENCOUNTER — Encounter: Payer: Self-pay | Admitting: Internal Medicine

## 2012-06-30 VITALS — BP 117/69 | HR 81 | Temp 98.2°F | Ht 62.0 in | Wt 147.8 lb

## 2012-06-30 DIAGNOSIS — R110 Nausea: Secondary | ICD-10-CM

## 2012-06-30 DIAGNOSIS — K5900 Constipation, unspecified: Secondary | ICD-10-CM | POA: Diagnosis not present

## 2012-06-30 NOTE — Progress Notes (Signed)
Primary Care Physician:  Cassell Smiles., MD Primary Gastroenterologist:  Dr. Jena Gauss  Pre-Procedure History & Physical: HPI:  Mary Hart is a 71 y.o. female here for followup of nausea to be secondary to constipation. Past one month liberalizing use of MiraLax has been associated with better bowel function and essentially resolution of nausea. She's been fairly extensively evaluated. Background GERD well-controlled on pantoprazole. CT demonstrated large stool burden question of thickening of the left colon which is likely artifactual in retrospect. Negative colonoscopy 2009. Reports multiple fractures. Osteopenia on the prior bone density study she just had one done and does not know the results this time. Stop taking bisphosphonate therapy for fear for side effects. Takes MiraLax in the morning along with 1 tablespoon of Benefiber. She stopped taking calcium because of of constipation.   Past Medical History  Diagnosis Date  . GERD (gastroesophageal reflux disease)   . Hypothyroid   . Arthritis   . Neuropathy   . Depression   . IBS (irritable bowel syndrome)     Past Surgical History  Procedure Date  . Colonoscopy 06/20/2008    [facility] Dr. Damita Dunnings external hemorrhoids  . Anterior cervical fusion x 2   . Joint replacement 09/2010    Thumb  . Vesicovaginal fistula closure w/ tah   . Cholecystectomy   . Esophagogastroduodenoscopy 11/25/10    Rourk- tubular esophagus s/p of 56 dilator from a small HH/otherwise normal  . Abdominal hysterectomy     Prior to Admission medications   Medication Sig Start Date End Date Taking? Authorizing Provider  acyclovir (ZOVIRAX) 400 MG tablet Take 400 mg by mouth daily.    Yes Historical Provider, MD  gabapentin (NEURONTIN) 300 MG capsule Take 300-600 mg by mouth 2 (two) times daily. Patient takes 1 capsule in the morning and 2 capsules at bedtime   Yes Historical Provider, MD  HYDROcodone-acetaminophen (VICODIN) 5-500 MG per tablet Take 1 tablet  by mouth every 6 (six) hours as needed.   Yes Historical Provider, MD  levothyroxine (SYNTHROID, LEVOTHROID) 25 MCG tablet Take 25 mcg by mouth daily.     Yes Historical Provider, MD  pantoprazole (PROTONIX) 40 MG tablet Take 40 mg by mouth daily.   Yes Historical Provider, MD  polyethylene glycol (MIRALAX / GLYCOLAX) packet Take 17 g by mouth daily.   Yes Historical Provider, MD  promethazine (PHENERGAN) 25 MG tablet Take 25 mg by mouth every 6 (six) hours as needed.     Yes Historical Provider, MD  Wheat Dextrin (BENEFIBER DRINK MIX PO) Take by mouth.   Yes Historical Provider, MD  Calcium Carbonate-Vitamin D (CALCIUM + D PO) Take by mouth.      Historical Provider, MD  dexlansoprazole (DEXILANT) 60 MG capsule Take 60 mg by mouth daily.    Historical Provider, MD  Lactobacillus (ACIDOPHILUS PO) Take 1 mg by mouth 2 (two) times daily.    Historical Provider, MD  peg 3350 powder (MOVIPREP) 100 G SOLR Take 1 kit (100 g total) by mouth once. As directed Please purchase 1 Fleets enema to use with the prep 05/25/12   [place] Bali, MD  vitamin C (ASCORBIC ACID) 500 MG tablet Take 500 mg by mouth daily.    Historical Provider, MD    Allergies as of 06/30/2012 - Review Complete 06/30/2012  Allergen Reaction Noted  . Codeine    . Hydromorphone hcl    . Morphine    . Moxifloxacin    . Sulfonamide derivatives  No family history on file.  History   Social History  . Marital Status: Married    Spouse Name: N/A    Number of Children: N/A  . Years of Education: N/A   Occupational History  . Not on file.   Social History Main Topics  . Smoking status: Current Everyday Smoker -- 0.5 packs/day for 50 years    Types: Cigarettes  . Smokeless tobacco: Not on file  . Alcohol Use: Yes  . Drug Use: No  . Sexually Active: Not on file   Other Topics Concern  . Not on file   Social History Narrative  . No narrative on file    Review of Systems: See HPI, otherwise negative  ROS  Physical Exam: BP 117/69  Pulse 81  Temp 98.2 F (36.8 C) (Temporal)  Ht 5\' 2"  (1.575 m)  Wt 147 lb 12.8 oz (67.042 kg)  BMI 27.03 kg/m2 General:   Alert,  Well-developed, well-nourished, pleasant and cooperative in NAD Skin:  Intact without significant lesions or rashes. Eyes:  Sclera clear, no icterus.   Conjunctiva pink. Ears:  Normal auditory acuity. Nose:  No deformity, discharge,  or lesions. Mouth:  No deformity or lesions. Neck:  Supple; no masses or thyromegaly. No significant cervical adenopathy. Lungs:  Clear throughout to auscultation.   No wheezes, crackles, or rhonchi. No acute distress. Heart:  Regular rate and rhythm; no murmurs, clicks, rubs,  or gallops. Abdomen: Non-distended, normal bowel sounds.  Soft and nontender without appreciable mass or hepatosplenomegaly.  Pulses:  Normal pulses noted. Extremities:  Without clubbing or edema.  Impression/Plan:  Constipation much improved liberalization of MiraLax and daily Benefiber. I'm concerned about her osteopenia her fractures and her lack of calcium or bisphosphonate therapy  Recommendations: Continue to utilize MiraLax 17 g orally each morning. I would like her take 2 tablespoons of Benefiber in the afternoon but not at the same time as MiraLax. I strongly urged she resume her calcium/vitamin D supplement. If she gets constipated with resumption of calcium, I recommended she take an additional capful of  MiraLax at bedtime. Her followup bone density study may well be no better and probably worse than the prior study. Moreover, I recommended she follow her primary care physician recommendations of taking  bisphosphonate therapy. I told her she should consider getting back on one of those agents if deemed indicated by her PCP.  I would prefer her to be on one of the parenteral agents given her esophageal issues she's has had in the past. I am also wondering how critical acid suppression therapy is in this particular  setting. This may also be indirectly contributing to osteopenia. However, for now I feel the benefits outweigh the risks of this approach.  Office visit here with me in 3 months. Patient is to call if any interim issues arise.

## 2012-06-30 NOTE — Patient Instructions (Signed)
Take Benefiber 2 tablespoons in the afternoon - everyday  Take Miralax  One capful each morning - and possibly a dose in the evening as needed  Resume Calcium supplement   See your primary care doctor about resuming other treatments for osteopenia as appropriate   Office visit with me in 3 months

## 2012-07-19 DIAGNOSIS — M25569 Pain in unspecified knee: Secondary | ICD-10-CM | POA: Diagnosis not present

## 2012-07-31 DIAGNOSIS — M949 Disorder of cartilage, unspecified: Secondary | ICD-10-CM | POA: Diagnosis not present

## 2012-07-31 DIAGNOSIS — B9789 Other viral agents as the cause of diseases classified elsewhere: Secondary | ICD-10-CM | POA: Diagnosis not present

## 2012-07-31 DIAGNOSIS — Z6826 Body mass index (BMI) 26.0-26.9, adult: Secondary | ICD-10-CM | POA: Diagnosis not present

## 2012-07-31 DIAGNOSIS — J069 Acute upper respiratory infection, unspecified: Secondary | ICD-10-CM | POA: Diagnosis not present

## 2012-07-31 DIAGNOSIS — E039 Hypothyroidism, unspecified: Secondary | ICD-10-CM | POA: Diagnosis not present

## 2012-08-07 ENCOUNTER — Other Ambulatory Visit (HOSPITAL_COMMUNITY): Payer: Self-pay | Admitting: *Deleted

## 2012-08-09 ENCOUNTER — Ambulatory Visit (HOSPITAL_COMMUNITY)
Admission: RE | Admit: 2012-08-09 | Discharge: 2012-08-09 | Disposition: A | Discharge status: Home / Self Care | Payer: Medicare Other | Source: Ambulatory Visit | Attending: Family Medicine | Admitting: Family Medicine

## 2012-08-09 ENCOUNTER — Other Ambulatory Visit (HOSPITAL_COMMUNITY): Payer: Self-pay | Admitting: Family Medicine

## 2012-08-09 DIAGNOSIS — R05 Cough: Secondary | ICD-10-CM

## 2012-08-09 DIAGNOSIS — R079 Chest pain, unspecified: Secondary | ICD-10-CM | POA: Insufficient documentation

## 2012-08-09 DIAGNOSIS — F4320 Adjustment disorder, unspecified: Secondary | ICD-10-CM | POA: Diagnosis not present

## 2012-08-09 DIAGNOSIS — J42 Unspecified chronic bronchitis: Secondary | ICD-10-CM | POA: Diagnosis not present

## 2012-08-09 DIAGNOSIS — Z6826 Body mass index (BMI) 26.0-26.9, adult: Secondary | ICD-10-CM | POA: Diagnosis not present

## 2012-08-09 DIAGNOSIS — J209 Acute bronchitis, unspecified: Secondary | ICD-10-CM | POA: Diagnosis not present

## 2012-08-09 DIAGNOSIS — F17200 Nicotine dependence, unspecified, uncomplicated: Secondary | ICD-10-CM | POA: Insufficient documentation

## 2012-08-09 DIAGNOSIS — R0989 Other specified symptoms and signs involving the circulatory and respiratory systems: Secondary | ICD-10-CM | POA: Insufficient documentation

## 2012-08-09 DIAGNOSIS — R059 Cough, unspecified: Secondary | ICD-10-CM | POA: Diagnosis not present

## 2012-08-09 DIAGNOSIS — J9819 Other pulmonary collapse: Secondary | ICD-10-CM | POA: Insufficient documentation

## 2012-08-09 DIAGNOSIS — J40 Bronchitis, not specified as acute or chronic: Secondary | ICD-10-CM | POA: Diagnosis not present

## 2012-08-10 ENCOUNTER — Ambulatory Visit (HOSPITAL_COMMUNITY)
Admission: RE | Admit: 2012-08-10 | Discharge: 2012-08-10 | Disposition: A | Discharge status: Home / Self Care | Payer: Medicare Other | Source: Ambulatory Visit | Attending: Obstetrics and Gynecology | Admitting: Obstetrics and Gynecology

## 2012-08-10 DIAGNOSIS — M810 Age-related osteoporosis without current pathological fracture: Secondary | ICD-10-CM | POA: Insufficient documentation

## 2012-08-10 MED ORDER — ZOLEDRONIC ACID 5 MG/100ML IV SOLN
INTRAVENOUS | Status: AC
  Administered 2012-08-10: 5 mg
  Filled 2012-08-10: qty 100

## 2012-08-10 MED ORDER — ZOLEDRONIC ACID 5 MG/100ML IV SOLN: 5.0000 mg | Freq: Once | INTRAVENOUS | Status: DC

## 2012-08-31 DIAGNOSIS — Z23 Encounter for immunization: Secondary | ICD-10-CM | POA: Diagnosis not present

## 2012-09-18 ENCOUNTER — Ambulatory Visit (INDEPENDENT_AMBULATORY_CARE_PROVIDER_SITE_OTHER): Payer: Medicare Other | Admitting: Gastroenterology

## 2012-09-18 ENCOUNTER — Encounter: Payer: Self-pay | Admitting: Gastroenterology

## 2012-09-18 VITALS — BP 124/71 | HR 69 | Temp 97.9°F | Ht 62.0 in | Wt 144.4 lb

## 2012-09-18 DIAGNOSIS — K219 Gastro-esophageal reflux disease without esophagitis: Secondary | ICD-10-CM

## 2012-09-18 DIAGNOSIS — K5732 Diverticulitis of large intestine without perforation or abscess without bleeding: Secondary | ICD-10-CM | POA: Diagnosis not present

## 2012-09-18 DIAGNOSIS — K5792 Diverticulitis of intestine, part unspecified, without perforation or abscess without bleeding: Secondary | ICD-10-CM

## 2012-09-18 MED ORDER — METRONIDAZOLE 500 MG PO TABS: 500.0000 mg | ORAL_TABLET | Freq: Three times a day (TID) | ORAL | Status: DC

## 2012-09-18 MED ORDER — CIPROFLOXACIN HCL 500 MG PO TABS: 500.0000 mg | ORAL_TABLET | Freq: Two times a day (BID) | ORAL | Status: DC

## 2012-09-18 NOTE — Assessment & Plan Note (Signed)
?  diverticulitis. Last colonoscopy in 2009 without report of diverticula. Based on symptoms and exam, will treat empirically. Patient states she has tolerated Cipro and Levaquin in the past but does not tolerate Avelox. She states she has taken Cipro frequently without problems. Agrees to Cipro and Flagyl. Progress report Wednesday if no better.

## 2012-09-18 NOTE — Patient Instructions (Addendum)
Based on your symptoms, we are going to treat you for possible diverticulitis. As discussed, we will give you Cipro and Flagyl for 10 days. If you notice rash, swelling, problems breathing go to nearest ER.  Call Wednesday if you are not better. I will discuss with Dr. Jena Gauss regarding changing your medication for acid reflux. Office visit with Dr. Jena Gauss in 10/2012.

## 2012-09-18 NOTE — Progress Notes (Signed)
Primary Care Physician: Cassell Smiles., MD  Primary Gastroenterologist:  Roetta Sessions, MD   Chief Complaint  Patient presents with  . Abdominal Pain  . Gas    HPI: Mary Hart is a 71 y.o. female here for f/u. Last seen in 06/2012 by Dr. Jena Gauss. H/O nausea felt to be secondary to constipation. H/O GERD well-controlled on pantoprazole.   Reclast once per year, recently had first dose. Bone density study recently abnormal, borderline osteoporosis of Left hip per patient. Other readings osteopenia.   States she had been doing reasonably well with constipation. Miralax BID. Benefiber less consistent. No nausea. She may go for 2-3 days, feels like needs to have BM but passes only small amount. After about three days, then large loose stools. Last week on Friday night, she ate large popcorn while at the movies. Friday morning, had a large stool prior to this. Saturday, good BM in the AM but later started feeling nauseated, flushed, cold chills, cramping and sore on left side and into back. Couple of small stools Saturday afternoon. Sunday, took laxative to try and get relief. Loose watery stools but persistent abdominal pain. Bloating/gas bad since Friday/sat.   Lots of heartburn on pantoprazole. Nexium cost too much. Dexilant too expensive. Prilosec/prevacid not as good.     Current Outpatient Prescriptions  Medication Sig Dispense Refill  . acyclovir (ZOVIRAX) 400 MG tablet Take 400 mg by mouth daily.       . Calcium Carbonate-Vitamin D (CALCIUM + D PO) Take by mouth.        . gabapentin (NEURONTIN) 300 MG capsule Take 300-600 mg by mouth 2 (two) times daily. Patient takes 1 capsule in the morning and 2 capsules at bedtime      . HYDROcodone-acetaminophen (VICODIN) 5-500 MG per tablet Take 1 tablet by mouth every 6 (six) hours as needed.      . Lactobacillus (ACIDOPHILUS PO) Take 1 mg by mouth 2 (two) times daily.      Marland Kitchen levothyroxine (SYNTHROID, LEVOTHROID) 25 MCG tablet Take 25 mcg  by mouth daily.        . pantoprazole (PROTONIX) 40 MG tablet Take 40 mg by mouth daily.      . polyethylene glycol (MIRALAX / GLYCOLAX) packet Take 17 g by mouth 2 (two) times daily.       . promethazine (PHENERGAN) 25 MG tablet Take 25 mg by mouth every 6 (six) hours as needed.        . vitamin C (ASCORBIC ACID) 500 MG tablet Take 500 mg by mouth daily.      . Wheat Dextrin (BENEFIBER DRINK MIX PO) Take by mouth.        Allergies as of 09/18/2012 - Review Complete 09/18/2012  Allergen Reaction Noted  . Codeine    . Hydromorphone hcl    . Morphine    . Moxifloxacin    . Sulfonamide derivatives      ROS:  General: Negative for anorexia, weight loss, fever, chills, fatigue, weakness. ENT: Negative for hoarseness, difficulty swallowing , nasal congestion. CV: Negative for chest pain, angina, palpitations, dyspnea on exertion, peripheral edema.  Respiratory: Negative for dyspnea at rest, dyspnea on exertion, cough, sputum, wheezing.  GI: See history of present illness. GU:  Negative for dysuria, hematuria, urinary incontinence, urinary frequency, nocturnal urination.  Endo: Negative for unusual weight change.    Physical Examination:   BP 124/71  Pulse 69  Temp 97.9 F (36.6 C) (Temporal)  Ht 5\' 2"  (1.575 m)  Wt 144 lb 6.4 oz (65.499 kg)  BMI 26.41 kg/m2  General: Well-nourished, well-developed in no acute distress.  Eyes: No icterus. Mouth: Oropharyngeal mucosa moist and pink , no lesions erythema or exudate. Lungs: Clear to auscultation bilaterally.  Heart: Regular rate and rhythm, no murmurs rubs or gallops.  Abdomen: Bowel sounds are normal, LLQ tenderness, nondistended, no hepatosplenomegaly or masses, no abdominal bruits or hernia , no rebound or guarding.   Extremities: No lower extremity edema. No clubbing or deformities. Neuro: Alert and oriented x 4   Skin: Warm and dry, no jaundice.   Psych: Alert and cooperative, normal mood and affect.

## 2012-09-18 NOTE — Progress Notes (Signed)
Faxed to PCP

## 2012-09-18 NOTE — Assessment & Plan Note (Signed)
Refractory GERD on pantoprazole once daily. May benefit from BID, could not afford Nexium or Dexilant previously due to high copay. Of course, would have some concern about increasing PPI in setting of abnormal bone density study. To discuss further with Dr. Jena Gauss.   Keep OV with Dr. Jena Gauss in 10/2012.

## 2012-09-27 MED ORDER — PANTOPRAZOLE SODIUM 40 MG PO TBEC: 40.0000 mg | DELAYED_RELEASE_TABLET | Freq: Two times a day (BID) | ORAL | Status: DC

## 2012-09-27 NOTE — Progress Notes (Signed)
Please let patient know. I discussed refractory GERD with Dr. Jena Gauss. At this time, he would recommend increasing her pantoprazole to 40mg  BID for 2-3 months. He is aware she is on Reclast for osteoporosis. Keep OV in 10/2012 with Dr. Jena Gauss as planned.   New RX for pantoprazole sent to pharmacy.

## 2012-09-27 NOTE — Progress Notes (Signed)
Tried to call pt- LMOM 

## 2012-09-27 NOTE — Addendum Note (Signed)
Addended by: Tiffany Kocher on: 09/27/2012 03:04 PM   Modules accepted: Orders

## 2012-09-28 NOTE — Progress Notes (Signed)
Pt aware.

## 2012-10-02 ENCOUNTER — Telehealth: Payer: Self-pay | Admitting: Internal Medicine

## 2012-10-02 ENCOUNTER — Other Ambulatory Visit: Payer: Self-pay | Admitting: Gastroenterology

## 2012-10-02 ENCOUNTER — Other Ambulatory Visit: Payer: Self-pay

## 2012-10-02 DIAGNOSIS — R109 Unspecified abdominal pain: Secondary | ICD-10-CM

## 2012-10-02 DIAGNOSIS — R197 Diarrhea, unspecified: Secondary | ICD-10-CM

## 2012-10-02 LAB — CBC WITH DIFFERENTIAL/PLATELET
Basophils Absolute: 0 10*3/uL (ref 0.0–0.1)
Basophils Relative: 1 % (ref 0–1)
HCT: 41.4 % (ref 36.0–46.0)
Hemoglobin: 14.3 g/dL (ref 12.0–15.0)
Lymphocytes Relative: 37 % (ref 12–46)
Monocytes Absolute: 0.4 10*3/uL (ref 0.1–1.0)
Monocytes Relative: 8 % (ref 3–12)
Neutro Abs: 3 10*3/uL (ref 1.7–7.7)
Neutrophils Relative %: 52 % (ref 43–77)
RBC: 4.54 MIL/uL (ref 3.87–5.11)
WBC: 5.6 10*3/uL (ref 4.0–10.5)

## 2012-10-02 NOTE — Telephone Encounter (Signed)
Pt called crying on the phone this morning asking if the nurse would call her back. She said her abdomen was hurting and having cold chills and doesn't know what else to do. Please call her at [phone]

## 2012-10-02 NOTE — Telephone Encounter (Signed)
Spoke with pt- she was feeling a little better all weekend until Sunday when her abd pain and diarrhea started again. She is having nausea, no vomiting, no fever but she is having cold chills. Pt stated the pain feels like it did before. She did finish the cipro but was unable to finish the flagyl because it made her sick on her stomach every time she took it. Pt wants to know what she needs to do now? Please advise.  Pt is also requesting a refill on the phenergan.

## 2012-10-02 NOTE — Telephone Encounter (Signed)
Spoke with LSL- pt needs cbc, cdiff, stool culture and giardia. Pt is aware and lab orders have been faxed to the lab. Informed pt she needed to get stool containers while she is at the lab.

## 2012-10-03 MED ORDER — PROMETHAZINE HCL 25 MG PO TABS: 25.0000 mg | ORAL_TABLET | Freq: Three times a day (TID) | ORAL | Status: DC | PRN

## 2012-10-03 NOTE — Progress Notes (Signed)
Quick Note:  She needs to be taking her temperature is she is not.  Offer her OV here in AM or she can see her PCP. ______

## 2012-10-03 NOTE — Telephone Encounter (Addendum)
Please let her know I sent rx for phenergan. How is she today?

## 2012-10-03 NOTE — Progress Notes (Signed)
Quick Note:  CBC normal. Await stool cultures.  How is she doing today? ______

## 2012-10-03 NOTE — Telephone Encounter (Signed)
Pt aware. Please see blood work results.

## 2012-10-03 NOTE — Addendum Note (Signed)
Addended by: Tiffany Kocher on: 10/03/2012 12:39 PM   Modules accepted: Orders

## 2012-10-04 ENCOUNTER — Encounter: Payer: Self-pay | Admitting: Gastroenterology

## 2012-10-04 ENCOUNTER — Ambulatory Visit (INDEPENDENT_AMBULATORY_CARE_PROVIDER_SITE_OTHER): Payer: Medicare Other | Admitting: Gastroenterology

## 2012-10-04 ENCOUNTER — Other Ambulatory Visit: Payer: Self-pay | Admitting: Internal Medicine

## 2012-10-04 VITALS — BP 121/66 | HR 74 | Temp 97.9°F | Ht 62.0 in | Wt 145.4 lb

## 2012-10-04 DIAGNOSIS — K589 Irritable bowel syndrome without diarrhea: Secondary | ICD-10-CM

## 2012-10-04 DIAGNOSIS — R1032 Left lower quadrant pain: Secondary | ICD-10-CM

## 2012-10-04 DIAGNOSIS — R110 Nausea: Secondary | ICD-10-CM

## 2012-10-04 DIAGNOSIS — B373 Candidiasis of vulva and vagina: Secondary | ICD-10-CM

## 2012-10-04 DIAGNOSIS — R197 Diarrhea, unspecified: Secondary | ICD-10-CM | POA: Diagnosis not present

## 2012-10-04 MED ORDER — PEG 3350-KCL-NA BICARB-NACL 420 G PO SOLR: 4000.0000 mL | ORAL | Status: DC

## 2012-10-04 MED ORDER — FLUCONAZOLE 150 MG PO TABS: 150.0000 mg | ORAL_TABLET | Freq: Once | ORAL | Status: DC

## 2012-10-04 NOTE — Progress Notes (Signed)
Faxed to PCP

## 2012-10-04 NOTE — Patient Instructions (Addendum)
I will discuss your case with Dr. Jena Gauss today and get back in touch with you. Take Diflucan X 1 today, if still with persistent vaginal itching/discharge you may repeat in 3-5 days.

## 2012-10-04 NOTE — Progress Notes (Signed)
Patient is scheduled with RMR on 12/19 and I have mailed instructions and patient is aware

## 2012-10-04 NOTE — Assessment & Plan Note (Signed)
LLQ pain associated with diarrhea, nausea. CBC ok. No documented fever. No report of diverticula on CT earlier this year or TCS 2009. She had two weeks of diarrhea but none in two days since stool studies ordered. ?post-infectious IBS? Notably, patient took Cipro only, did not tolerate Flagyl provided to her at last OV.   To discuss with Dr. Jena Gauss today and further recommendations to follow. I advised her to collect stool test if diarrhea returns. ?colonoscopy as next step?

## 2012-10-04 NOTE — Assessment & Plan Note (Signed)
Diflucan 150mg  X 1 and repeat X 1 in 3-5 days if needed.

## 2012-10-04 NOTE — Progress Notes (Signed)
Spoke with Dr. Jena Gauss. Recommends colonoscopy in near future. Please arrange. Patient expecting phone call today.

## 2012-10-04 NOTE — Progress Notes (Addendum)
Primary Care Physician: Cassell Smiles., MD  Primary Gastroenterologist:  Roetta Sessions, MD   Chief Complaint  Patient presents with  . Follow-up  . Diarrhea    weak    HPI: Mary Hart is a 71 y.o. female here for semi-urgent f/u. Last seen on 09/18/12 with complaints of nausea, chills, left sided abdominal pain after eating popcorn the evening before. Last TCS 2009 without mention of diverticula by Dr. Karilyn Cota. In July 2013, CT A/P with CM showed decompressed descending and sigmoid colon with ?mild mural thickening, prominent stool burden right abdomen. CT A/P with contrast in 05/2012 showed residual mild thickening of sigmoid colon wall but improved compared to prior study. TI unremarkable. Both CTs reviewed by Dr. Jena Gauss with radiologist Dr. Kearney Hard at that time. It was felt that colon actually looked pretty much normal aside from constipation.  At last OV, we decided to treat her empirically for diverticulitis. She took the Cipro but stopped the Flagyl due to nausea.  Was feeling better until the Friday after Thanksgiving. Symptoms started back again. Nausea without vomiting. Diarrhea since before Thanksgiving. Watery stools for two weeks. No stool since Monday (two days ago) when stool studies ordered. Started back on Miralax BID and fiber in afternoon since earlier this week. Yesterday ate chicken noodle soup for lunch, within 10 minutes pain hit in left abdomen and nausea without vomiting. Lots of gas. LLQ pain from nagging to stabbing. Eating makes it worse. Last night ate stew potatoes and cornbread. Afraid to eat secondary to the pain. Drinking plenty of fluids. Urine light in color.   No recent changes in thyroid medications. States her TSH levels were checked within the last couple of months.   Cold chills with diarrhea. Face feels warm, skin clammy but no fever.   Today feels little better, ate cream of wheat for breakfast. No BM since Monday. No ill contacts. C/O vaginal itching  since on Cipro.   Current Outpatient Prescriptions  Medication Sig Dispense Refill  . acyclovir (ZOVIRAX) 400 MG tablet Take 400 mg by mouth daily.       . Calcium Carbonate-Vitamin D (CALCIUM + D PO) Take by mouth.        . gabapentin (NEURONTIN) 300 MG capsule Take 300-600 mg by mouth 2 (two) times daily. Patient takes 1 capsule in the morning and 2 capsules at bedtime      . HYDROcodone-acetaminophen (VICODIN) 5-500 MG per tablet Take 1 tablet by mouth every 6 (six) hours as needed.      . Lactobacillus (ACIDOPHILUS PO) Take 1 mg by mouth 2 (two) times daily.      Marland Kitchen levothyroxine (SYNTHROID, LEVOTHROID) 25 MCG tablet Take 25 mcg by mouth daily.        . pantoprazole (PROTONIX) 40 MG tablet Take 1 tablet (40 mg total) by mouth 2 (two) times daily before a meal.  60 tablet  3  . polyethylene glycol (MIRALAX / GLYCOLAX) packet Take 17 g by mouth 2 (two) times daily.       . promethazine (PHENERGAN) 25 MG tablet Take 1 tablet (25 mg total) by mouth every 8 (eight) hours as needed.  20 tablet  0  . vitamin C (ASCORBIC ACID) 500 MG tablet Take 500 mg by mouth daily.      . Wheat Dextrin (BENEFIBER DRINK MIX PO) Take by mouth.      . fluconazole (DIFLUCAN) 150 MG tablet Take 1 tablet (150 mg total) by mouth once. May repeat in 72  hours if needed.  2 tablet  0    Allergies as of 10/04/2012 - Review Complete 10/04/2012  Allergen Reaction Noted  . Codeine    . Hydromorphone hcl    . Morphine    . Moxifloxacin    . Sulfonamide derivatives     Past Medical History  Diagnosis Date  . GERD (gastroesophageal reflux disease)   . Hypothyroid   . Arthritis   . Neuropathy   . Depression   . IBS (irritable bowel syndrome)    Past Surgical History  Procedure Date  . Colonoscopy 06/20/2008    [facility] Dr. Damita Dunnings external hemorrhoids  . Anterior cervical fusion x 2   . Joint replacement 09/2010    Thumb  . Vesicovaginal fistula closure w/ tah   . Cholecystectomy   . Esophagogastroduodenoscopy  11/25/10    Rourk- tubular esophagus s/p of 56 dilator from a small HH/otherwise normal  . Abdominal hysterectomy    Family History  Problem Relation Age of Onset  . Colon cancer Neg Hx      History   Social History  . Marital Status: Married    Spouse Name: N/A    Number of Children: N/A  . Years of Education: N/A   Social History Main Topics  . Smoking status: Current Every Day Smoker -- 0.5 packs/day for 50 years    Types: Cigarettes  . Smokeless tobacco: None  . Alcohol Use: Yes  . Drug Use: No  . Sexually Active: None   Other Topics Concern  . None   Social History Narrative  . None    ROS:  General: Negative for weight loss, fever. See hpi. ENT: Negative for hoarseness, difficulty swallowing , nasal congestion. CV: Negative for chest pain, angina, palpitations, dyspnea on exertion, peripheral edema.  Respiratory: Negative for dyspnea at rest, dyspnea on exertion, cough, sputum, wheezing.  GI: See history of present illness. GU:  Negative for dysuria, hematuria, urinary incontinence, urinary frequency, nocturnal urination. See hpi. Endo: Negative for unusual weight change.    Physical Examination:   BP 121/66  Pulse 74  Temp 97.9 F (36.6 C) (Oral)  Ht 5\' 2"  (1.575 m)  Wt 145 lb 6.4 oz (65.953 kg)  BMI 26.59 kg/m2  General: Well-nourished, well-developed in no acute distress.  Eyes: No icterus. Mouth: Oropharyngeal mucosa moist and pink , no lesions erythema or exudate. Lungs: Clear to auscultation bilaterally.  Heart: Regular rate and rhythm, no murmurs rubs or gallops.  Abdomen: Bowel sounds are normal, mild LLQ tenderness, nondistended, no hepatosplenomegaly or masses, no abdominal bruits or hernia , no rebound or guarding.   Extremities: No lower extremity edema. No clubbing or deformities. Neuro: Alert and oriented x 4   Skin: Warm and dry, no jaundice.   Psych: Alert and cooperative, normal mood and affect.  Labs:  Lab Results  Component  Value Date   WBC 5.6 10/02/2012   HGB 14.3 10/02/2012   HCT 41.4 10/02/2012   MCV 91.2 10/02/2012   PLT 308 10/02/2012    Imaging Studies: No results found.

## 2012-10-05 ENCOUNTER — Encounter (HOSPITAL_COMMUNITY): Payer: Self-pay | Admitting: Pharmacy Technician

## 2012-10-12 ENCOUNTER — Encounter (HOSPITAL_COMMUNITY): Payer: Self-pay | Admitting: *Deleted

## 2012-10-12 ENCOUNTER — Ambulatory Visit (HOSPITAL_COMMUNITY)
Admission: RE | Admit: 2012-10-12 | Discharge: 2012-10-12 | Disposition: A | Discharge status: Home / Self Care | Payer: Medicare Other | Source: Ambulatory Visit | Attending: Internal Medicine | Admitting: Internal Medicine

## 2012-10-12 ENCOUNTER — Other Ambulatory Visit: Payer: Self-pay | Admitting: Gastroenterology

## 2012-10-12 ENCOUNTER — Encounter (HOSPITAL_COMMUNITY)
Admission: RE | Disposition: A | Discharge status: Home / Self Care | Payer: Self-pay | Source: Ambulatory Visit | Attending: Internal Medicine

## 2012-10-12 DIAGNOSIS — K5730 Diverticulosis of large intestine without perforation or abscess without bleeding: Secondary | ICD-10-CM | POA: Insufficient documentation

## 2012-10-12 DIAGNOSIS — R197 Diarrhea, unspecified: Secondary | ICD-10-CM | POA: Diagnosis not present

## 2012-10-12 DIAGNOSIS — R198 Other specified symptoms and signs involving the digestive system and abdomen: Secondary | ICD-10-CM

## 2012-10-12 DIAGNOSIS — Z01812 Encounter for preprocedural laboratory examination: Secondary | ICD-10-CM | POA: Diagnosis not present

## 2012-10-12 DIAGNOSIS — K648 Other hemorrhoids: Secondary | ICD-10-CM | POA: Diagnosis not present

## 2012-10-12 DIAGNOSIS — R1032 Left lower quadrant pain: Secondary | ICD-10-CM | POA: Insufficient documentation

## 2012-10-12 DIAGNOSIS — R109 Unspecified abdominal pain: Secondary | ICD-10-CM | POA: Diagnosis not present

## 2012-10-12 DIAGNOSIS — K589 Irritable bowel syndrome without diarrhea: Secondary | ICD-10-CM

## 2012-10-12 DIAGNOSIS — R110 Nausea: Secondary | ICD-10-CM

## 2012-10-12 HISTORY — PX: COLONOSCOPY: SHX5424

## 2012-10-12 SURGERY — COLONOSCOPY
Anesthesia: Moderate Sedation

## 2012-10-12 MED ORDER — MEPERIDINE HCL 100 MG/ML IJ SOLN
INTRAMUSCULAR | Status: AC
  Filled 2012-10-12: qty 2

## 2012-10-12 MED ORDER — STERILE WATER FOR IRRIGATION IR SOLN
Status: DC | PRN
  Administered 2012-10-12: 09:00:00

## 2012-10-12 MED ORDER — SODIUM CHLORIDE 0.45 % IV SOLN
INTRAVENOUS | Status: DC
  Administered 2012-10-12: 09:00:00 via INTRAVENOUS

## 2012-10-12 MED ORDER — MEPERIDINE HCL 100 MG/ML IJ SOLN
INTRAMUSCULAR | Status: DC | PRN
  Administered 2012-10-12 (×2): 25 mg via INTRAVENOUS
  Administered 2012-10-12: 50 mg via INTRAVENOUS

## 2012-10-12 MED ORDER — MIDAZOLAM HCL 5 MG/5ML IJ SOLN
INTRAMUSCULAR | Status: AC
  Filled 2012-10-12: qty 10

## 2012-10-12 MED ORDER — MEPERIDINE HCL 100 MG/ML IJ SOLN
INTRAMUSCULAR | Status: AC
  Filled 2012-10-12: qty 1

## 2012-10-12 MED ORDER — MIDAZOLAM HCL 5 MG/5ML IJ SOLN
INTRAMUSCULAR | Status: DC | PRN
  Administered 2012-10-12 (×2): 1 mg via INTRAVENOUS
  Administered 2012-10-12: 2 mg via INTRAVENOUS

## 2012-10-12 MED ORDER — VANCOMYCIN HCL 125 MG PO CAPS: 125.0000 mg | ORAL_CAPSULE | Freq: Four times a day (QID) | ORAL | Status: DC

## 2012-10-12 NOTE — H&P (View-Only) (Signed)
Quick Note:  CBC normal. Await stool cultures.  How is she doing today? ______ 

## 2012-10-12 NOTE — Op Note (Signed)
[facility] [address] [place], [address]   COLONOSCOPY PROCEDURE REPORT  PATIENT: Mary Hart, Mary Hart  MR#:         [id number] BIRTHDATE: 05-Oct-1941 , 71  yrs. old GENDER: Female ENDOSCOPIST: R.  Roetta Sessions, MD FACP FACG REFERRED BY:  Artis Delay, M.D. PROCEDURE DATE:  10/12/2012 PROCEDURE:     ileocolonoscopy with stool sampling and segmental biopsy  INDICATIONS:  Alternating constipation and diarrhea; left lower quadrant abdominal pain. Negative abdominal pelvic CT x2  INFORMED CONSENT:  The risks, benefits, alternatives and imponderables including but not limited to bleeding, perforation as well as the possibility of a missed lesion have been reviewed.  The potential for biopsy, lesion removal, etc. have also been discussed.  Questions have been answered.  All parties agreeable. Please see the history and physical in the medical record for more information.  MEDICATIONS: Versed 4 mg IV and Demerol 100 mg IV in divided doses.  DESCRIPTION OF PROCEDURE:  After a digital rectal exam was performed, the Pentax Colonoscope [id number]  colonoscope was advanced from the anus through the rectum and colon to the area of the cecum, ileocecal valve and appendiceal orifice.  The cecum was deeply intubated.  These structures were well-seen and photographed for the record.  From the level of the cecum and ileocecal valve, the scope was slowly and cautiously withdrawn.  The mucosal surfaces were carefully surveyed utilizing scope tip deflection to facilitate fold flattening as needed.  The scope was pulled down into the rectum where a thorough examination including retroflexion was performed.    FINDINGS:  Adequate preparation. Internal hemorrhoids; otherwise, normal rectum. Patient had very early, few scattered sigmoid diverticula. However, the remainder of the colonic mucosa appeared entirely normal. The distal 10 cm of terminal ileal mucosa appeared normal as  well.  THERAPEUTIC / DIAGNOSTIC MANEUVERS PERFORMED:  Stool samples obtained for microbiology lab. Also, segment biopsies of the ascending and sigmoid segments were taken for histologic study.  COMPLICATIONS: None  CECAL WITHDRAWAL TIME:  10 minutes  IMPRESSION:  Internal hemorrhoids.   Very early, few, sigmoid diverticula (minimal); otherwise,  normal-appearing colon and terminal ileum   -  status post stool sampling and segmental biopsy.  RECOMMENDATIONS: Further recommendations to follow pending review of pending studies.   _______________________________ eSigned:  R. Roetta Sessions, MD FACP [facility] 10/12/2012 9:41 AM   CC:    PATIENT NAME:  Mary Hart, Mary Hart MR#: [id number]

## 2012-10-12 NOTE — Interval H&P Note (Signed)
History and Physical Interval Note:  10/12/2012 9:02 AM  Mary Hart  has presented today for surgery, with the diagnosis of DIARRHEA, LLQ PAIN, IBS , NAUSEA  The various methods of treatment have been discussed with the patient and family. After consideration of risks, benefits and other options for treatment, the patient has consented to  Procedure(s) (LRB) with comments: COLONOSCOPY (N/A) - 9:15 as a surgical intervention .  The patient's history has been reviewed, patient examined, no change in status, stable for surgery.  I have reviewed the patient's chart and labs.  Questions were answered to the patient's satisfaction.     Eula Listen

## 2012-10-12 NOTE — Progress Notes (Signed)
Quick Note:  Discussed with Dr. Jena Gauss. Will treat with Vancomycin 125mg  QID for 10 days. Patient did not tolerate Flagyl previously. Will also give her probiotic. RX for vancomycin sent to pharmacy. Please offer her Align samples, #30. Take one daily. Call with further problems. ______

## 2012-10-13 LAB — GIARDIA/CRYPTOSPORIDIUM SCREEN(EIA)
Cryptosporidium Screen (EIA): NEGATIVE
Giardia Screen - EIA: NEGATIVE

## 2012-10-13 LAB — OVA AND PARASITE EXAMINATION: Ova and parasites: NONE SEEN

## 2012-10-15 ENCOUNTER — Telehealth: Payer: Self-pay | Admitting: Internal Medicine

## 2012-10-15 NOTE — Telephone Encounter (Signed)
Spoke with Pt   - overall, diarrhea  Improved.  Some flushing , no rash, dyspnea - taking benadryl; I advised complete course of vancomycin; take benadr yl 30 minutes before vanco dosing; informed of no colitis on biopsies; call if sx worsen;  Send  path to referring provider and PCP.

## 2012-10-16 ENCOUNTER — Encounter (HOSPITAL_COMMUNITY): Payer: Self-pay | Admitting: Internal Medicine

## 2012-10-16 LAB — STOOL CULTURE

## 2012-10-16 NOTE — Telephone Encounter (Signed)
cc'd path report.

## 2012-10-25 HISTORY — PX: OTHER SURGICAL HISTORY: SHX169

## 2012-10-26 ENCOUNTER — Telehealth: Payer: Self-pay | Admitting: Internal Medicine

## 2012-10-26 NOTE — Telephone Encounter (Signed)
Pt is aware and agrees with plan.

## 2012-10-26 NOTE — Telephone Encounter (Signed)
Pt has been taking antibiotics and were working well for her. She said that RMR called her Sunday night after her tcs and he told her to hold off on taking Miralax while taking the antibiotic. She started taking the Miralax again Monday and Tuesday night she was cramping with pain. She said she took a laxative and had some relief, but needed to take a phenergan because of being sick on her stomach. She has multiple concerns and would like to know what can be advised. Please call her at [phone]

## 2012-10-26 NOTE — Telephone Encounter (Signed)
Spoke with pt- she was feeling good for about the first 4 days of taking the vacomyacin, on the 5th day she became bloating and feeling uncomfortable, she had not had a BM so she took a Dulcolax and felt a little better. Pt restarted miralax. Pt then went another  3 days without a BM and took another dulcolax, that night she had nausea and dry heaves and took some phenergan. She has been taking her reflux medication bid and is taking gas x but still doesn't feel relief. Pt said her last BM was this morning but it was just a little bit. Pt wants to know if there is anything she can take to help with the constipation.

## 2012-10-26 NOTE — Telephone Encounter (Signed)
pts cell number is [phone]

## 2012-10-26 NOTE — Telephone Encounter (Signed)
Patient has reverted back to her recent baseline of constipation. Needs to complete vancomycin therapy if not already done. Titrate MiraLax upwards to 1-2 capsules twice daily as needed to facilitate reasonably good bowel movements daily to every other day;  MiraLax will work  - we just need to find the optimal dose for this nice lady.

## 2012-11-02 DIAGNOSIS — M25569 Pain in unspecified knee: Secondary | ICD-10-CM | POA: Diagnosis not present

## 2012-11-07 ENCOUNTER — Ambulatory Visit (INDEPENDENT_AMBULATORY_CARE_PROVIDER_SITE_OTHER): Payer: Medicare Other | Admitting: Internal Medicine

## 2012-11-07 ENCOUNTER — Encounter: Payer: Self-pay | Admitting: Internal Medicine

## 2012-11-07 VITALS — BP 115/73 | HR 76 | Temp 98.1°F | Ht 62.0 in | Wt 142.0 lb

## 2012-11-07 DIAGNOSIS — K5900 Constipation, unspecified: Secondary | ICD-10-CM

## 2012-11-07 DIAGNOSIS — K5730 Diverticulosis of large intestine without perforation or abscess without bleeding: Secondary | ICD-10-CM

## 2012-11-07 DIAGNOSIS — K5790 Diverticulosis of intestine, part unspecified, without perforation or abscess without bleeding: Secondary | ICD-10-CM

## 2012-11-07 DIAGNOSIS — R110 Nausea: Secondary | ICD-10-CM

## 2012-11-07 NOTE — Progress Notes (Signed)
Primary Care Physician:  Cassell Smiles., MD Primary Gastroenterologist:  Dr. Jena Gauss  Pre-Procedure History & Physical: HPI:  Mary Hart is a 72 y.o. female here for  followup of C. difficile infection. Completed vancomycin. Bowel function is reverted back to her baseline-that of constipation. Takes one half a capful of MiraLax twice daily no fiber supplements. Has flat long stringy bowel movements at times other times small pellets. Gets nausea about once a week and takes a Phenergan, however, Phenergan knocks her out. Hasn't had bleeding. Recent CBC completely normal. Overall doing better as far as her C. difficile infection is concerned. She does have at least one  bowel movement every day. Past Medical History  Diagnosis Date  . GERD (gastroesophageal reflux disease)   . Hypothyroid   . Arthritis   . Neuropathy   . Depression   . IBS (irritable bowel syndrome)   . Clostridium difficile infection     treated with vancomycin  . Internal hemorrhoids   . Diverticula of colon     minimal    Past Surgical History  Procedure Date  . Colonoscopy 06/20/2008    [facility] Dr. Damita Dunnings external hemorrhoids  . Anterior cervical fusion x 2   . Joint replacement 09/2010    Thumb  . Vesicovaginal fistula closure w/ tah   . Cholecystectomy   . Esophagogastroduodenoscopy 11/25/10    Kaiyu Mirabal- tubular esophagus s/p of 56 dilator from a small HH/otherwise normal  . Abdominal hysterectomy   . Colonoscopy 10/12/2012    Dr. Jena Gauss- internal hemorrhoids, very early, few, sigmoid diverticula o/w normal appearing colon and terminal ileum. + cdiff on stool samples taken.    Prior to Admission medications   Medication Sig Start Date End Date Taking? Authorizing Provider  acyclovir (ZOVIRAX) 400 MG tablet Take 400 mg by mouth daily.    Yes Historical Provider, MD  ALPRAZolam Prudy Feeler) 0.25 MG tablet Take 1 tablet by mouth 3 (three) times daily as needed. 08/09/12  Yes Historical Provider, MD  Calcium  Carbonate-Vitamin D (CALCIUM 600/VITAMIN D PO) Take 1 tablet by mouth 2 (two) times daily. OTC medication, the Vit D is 800 IU   Yes Historical Provider, MD  gabapentin (NEURONTIN) 300 MG capsule Take 600 mg by mouth at bedtime.    Yes Historical Provider, MD  Lactobacillus (ACIDOPHILUS PO) Take 1 mg by mouth 2 (two) times daily.   Yes Historical Provider, MD  levothyroxine (SYNTHROID, LEVOTHROID) 25 MCG tablet Take 25 mcg by mouth daily.     Yes Historical Provider, MD  pantoprazole (PROTONIX) 40 MG tablet Take 1 tablet (40 mg total) by mouth 2 (two) times daily before a meal. 09/27/12  Yes Tiffany Kocher, PA  polyethylene glycol (MIRALAX / GLYCOLAX) packet Take 17 g by mouth 2 (two) times daily.    Yes Historical Provider, MD  promethazine (PHENERGAN) 25 MG tablet Take 1 tablet (25 mg total) by mouth every 8 (eight) hours as needed. 10/03/12  Yes Tiffany Kocher, PA  vancomycin (VANCOCIN) 125 MG capsule Take 1 capsule (125 mg total) by mouth 4 (four) times daily. 10/12/12  Yes Tiffany Kocher, PA  polyethylene glycol-electrolytes (TRILYTE) 420 G solution Take 4,000 mLs by mouth as directed. 10/04/12   Corbin Ade, MD    Allergies as of 11/07/2012 - Review Complete 11/07/2012  Allergen Reaction Noted  . Codeine    . Hydromorphone hcl    . Morphine    . Moxifloxacin    . Sulfonamide derivatives  Family History  Problem Relation Age of Onset  . Colon cancer Maternal Uncle     History   Social History  . Marital Status: Married    Spouse Name: N/A    Number of Children: N/A  . Years of Education: N/A   Occupational History  . Not on file.   Social History Main Topics  . Smoking status: Current Every Day Smoker -- 0.5 packs/day for 50 years    Types: Cigarettes  . Smokeless tobacco: Not on file  . Alcohol Use: Yes  . Drug Use: No  . Sexually Active: Not on file   Other Topics Concern  . Not on file   Social History Narrative  . No narrative on file    Review of  Systems: See HPI, otherwise negative ROS  Physical Exam: BP 115/73  Pulse 76  Temp 98.1 F (36.7 C) (Oral)  Ht 5\' 2"  (1.575 m)  Wt 142 lb (64.411 kg)  BMI 25.97 kg/m2 General:   Alert,  Well-developed, well-nourished, pleasant and cooperative in NAD Skin:  Intact without significant lesions or rashes. Eyes:  Sclera clear, no icterus.   Conjunctiva pink. Ears:  Normal auditory acuity. Nose:  No deformity, discharge,  or lesions. Mouth:  No deformity or lesions. Neck:  Supple; no masses or thyromegaly. No significant cervical adenopathy. Lungs:  Clear throughout to auscultation.   No wheezes, crackles, or rhonchi. No acute distress. Heart:  Regular rate and rhythm; no murmurs, clicks, rubs,  or gallops. Abdomen: Non-distended, normal bowel sounds.  Soft and nontender without appreciable mass or hepatosplenomegaly.  Pulses:  Normal pulses noted. Extremities:  Without clubbing or edema.  Impression/Plan:  Recent C. difficile infection treated. She's backed her baseline of constipation. She does have diverticulosis and knees take extra fiber daily  Recommendations: MiraLax one half capful twice daily. Add Benefiber 2 tablespoons daily  Zofran 4 mg tablets-prescription provided for nausea 1 every 8 hours as needed use sparingly  I told this nice lady would like to titrate her bowel regimen to facilitate bowel function and  nausea in this manner rather than just treatment comes up as constipation appears to be strongly linked to nausea.  Patient to keep a nausea/stool diary  Office followup in 6 weeks.

## 2012-11-07 NOTE — Patient Instructions (Addendum)
Continue Miralax 1/2 capful twice daily  Benefiber - 2 tablespoons daily  Keep stool/nausea diary  Zofran 4 mg tablet as needed for nausea  Office visit in 6 weeks

## 2012-11-08 DIAGNOSIS — Z961 Presence of intraocular lens: Secondary | ICD-10-CM | POA: Diagnosis not present

## 2012-11-08 DIAGNOSIS — H35369 Drusen (degenerative) of macula, unspecified eye: Secondary | ICD-10-CM | POA: Diagnosis not present

## 2012-11-08 DIAGNOSIS — H353190 Nonexudative age-related macular degeneration, unspecified eye, stage unspecified: Secondary | ICD-10-CM | POA: Diagnosis not present

## 2012-11-09 DIAGNOSIS — IMO0002 Reserved for concepts with insufficient information to code with codable children: Secondary | ICD-10-CM | POA: Diagnosis not present

## 2012-11-09 DIAGNOSIS — M25569 Pain in unspecified knee: Secondary | ICD-10-CM | POA: Diagnosis not present

## 2012-12-19 ENCOUNTER — Encounter: Payer: Self-pay | Admitting: Internal Medicine

## 2012-12-19 ENCOUNTER — Ambulatory Visit (INDEPENDENT_AMBULATORY_CARE_PROVIDER_SITE_OTHER): Payer: Medicare Other | Admitting: Internal Medicine

## 2012-12-19 VITALS — BP 122/74 | HR 74 | Temp 98.1°F | Ht 62.0 in | Wt 142.0 lb

## 2012-12-19 DIAGNOSIS — K5900 Constipation, unspecified: Secondary | ICD-10-CM | POA: Diagnosis not present

## 2012-12-19 DIAGNOSIS — K219 Gastro-esophageal reflux disease without esophagitis: Secondary | ICD-10-CM | POA: Diagnosis not present

## 2012-12-19 DIAGNOSIS — K5909 Other constipation: Secondary | ICD-10-CM

## 2012-12-19 NOTE — Progress Notes (Addendum)
Primary Care Physician:  Cassell Smiles., MD Primary Gastroenterologist:  Dr. Jena Gauss  Pre-Procedure History & Physical: HPI:  Mary Hart is a 72 y.o. female here for followup of constipation. Recent C. difficile associated diarrhea-treated.    Was taking a half a capful of MiraLax twice daily and Benefiber. Patient states Benefiber bloated her too much. Took Ducolax  tablets and got a good "clean out". Goes 3 days without a bowel movement -  taking one capful of MiraLax daily. Stop Benefiber on her own. as "chills". Has multiple bowel movements after 3-4 days without any..   Cycle repeats. No diarrhea. Weight stable. Reflux symptoms well controlled on Protonix.  Past Medical History  Diagnosis Date  . GERD (gastroesophageal reflux disease)   . Hypothyroid   . Arthritis   . Neuropathy   . Depression   . IBS (irritable bowel syndrome)   . Clostridium difficile infection     treated with vancomycin  . Internal hemorrhoids   . Diverticula of colon     minimal    Past Surgical History  Procedure Laterality Date  . Colonoscopy  06/20/2008    [facility] Dr. Damita Dunnings external hemorrhoids  . Anterior cervical fusion x 2    . Joint replacement  09/2010    Thumb  . Vesicovaginal fistula closure w/ tah    . Cholecystectomy    . Esophagogastroduodenoscopy  11/25/10    Ajax Schroll- tubular esophagus s/p of 56 dilator from a small HH/otherwise normal  . Abdominal hysterectomy    . Colonoscopy  10/12/2012    Dr. Jena Gauss- internal hemorrhoids, very early, few, sigmoid diverticula o/w normal appearing colon and terminal ileum. + cdiff on stool samples taken.    Prior to Admission medications   Medication Sig Start Date End Date Taking? Authorizing Provider  acyclovir (ZOVIRAX) 400 MG tablet Take 400 mg by mouth daily.    Yes Historical Provider, MD  ALPRAZolam Prudy Feeler) 0.25 MG tablet Take 1 tablet by mouth 3 (three) times daily as needed. 08/09/12  Yes Historical Provider, MD  Calcium  Carbonate-Vitamin D (CALCIUM 600/VITAMIN D PO) Take 1 tablet by mouth 2 (two) times daily. OTC medication, the Vit D is 800 IU   Yes Historical Provider, MD  gabapentin (NEURONTIN) 300 MG capsule Take 600 mg by mouth at bedtime.    Yes Historical Provider, MD  Lactobacillus (ACIDOPHILUS PO) Take 1 mg by mouth 2 (two) times daily.   Yes Historical Provider, MD  levothyroxine (SYNTHROID, LEVOTHROID) 25 MCG tablet Take 25 mcg by mouth daily.     Yes Historical Provider, MD  magnesium gluconate (MAGONATE) 500 MG tablet Take 500 mg by mouth 2 (two) times daily.   Yes Historical Provider, MD  ondansetron (ZOFRAN) 4 MG tablet Take 4 mg by mouth every 8 (eight) hours as needed for nausea.   Yes Historical Provider, MD  pantoprazole (PROTONIX) 40 MG tablet Take 1 tablet (40 mg total) by mouth 2 (two) times daily before a meal. 09/27/12  Yes Tiffany Kocher, PA  polyethylene glycol (MIRALAX / GLYCOLAX) packet Take 17 g by mouth 2 (two) times daily.    Yes Historical Provider, MD  polyethylene glycol-electrolytes (TRILYTE) 420 G solution Take 4,000 mLs by mouth as directed. 10/04/12   Corbin Ade, MD  promethazine (PHENERGAN) 25 MG tablet Take 1 tablet (25 mg total) by mouth every 8 (eight) hours as needed. 10/03/12   Tiffany Kocher, PA  vancomycin (VANCOCIN) 125 MG capsule Take 1 capsule (125 mg total)  by mouth 4 (four) times daily. 10/12/12   Tiffany Kocher, PA    Allergies as of 12/19/2012 - Review Complete 12/19/2012  Allergen Reaction Noted  . Codeine    . Hydromorphone hcl    . Morphine    . Moxifloxacin    . Sulfonamide derivatives      Family History  Problem Relation Age of Onset  . Colon cancer Maternal Uncle     History   Social History  . Marital Status: Married    Spouse Name: N/A    Number of Children: N/A  . Years of Education: N/A   Occupational History  . Not on file.   Social History Main Topics  . Smoking status: Current Every Day Smoker -- 0.50 packs/day for 50 years     Types: Cigarettes  . Smokeless tobacco: Not on file  . Alcohol Use: Yes  . Drug Use: No  . Sexually Active: Not on file   Other Topics Concern  . Not on file   Social History Narrative  . No narrative on file    Review of Systems: See HPI, otherwise negative ROS  Physical Exam: BP 122/74  Pulse 74  Temp(Src) 98.1 F (36.7 C) (Oral)  Ht 5\' 2"  (1.575 m)  Wt 142 lb (64.411 kg)  BMI 25.97 kg/m2 General:   Alert,  Well-developed, well-nourished, pleasant and cooperative in NAD Skin:  Intact without significant lesions or rashes. Eyes:  Sclera clear, no icterus.   Conjunctiva pink. Ears:  Normal auditory acuity. Nose:  No deformity, discharge,  or lesions. Mouth:  No deformity or lesions. Neck:  Supple; no masses or thyromegaly. No significant cervical adenopathy. Lungs:  Clear throughout to auscultation.   No wheezes, crackles, or rhonchi. No acute distress. Heart:  Regular rate and rhythm; no murmurs, clicks, rubs,  or gallops. Abdomen: Non-distended, normal bowel sounds.  Soft and nontender without appreciable mass or hepatosplenomegaly.  Pulses:  Normal pulses noted. Extremities:  Without clubbing or edema.  Impression/Plan:  Chronic constipation/IBS-C. Under utilization of MiraLax likely recently. Bloating with fiber supplementation.  GERD symptoms well controlled on protonix.. I feel the benefits of this chronic therapy outweigh the risks.   Recommendations:     Stop Benefiber Continue miralax one capful each morning; add a second capful at bedtime if no BM on any given day Continue Protonix daily Office visit in 3 months

## 2012-12-19 NOTE — Patient Instructions (Addendum)
Stop Benefiber  Continue miralax one capful each morning; add a second capful at bedtime if no BM on any given day  Continue Protonix daily  Office visit in 3 months

## 2012-12-30 DIAGNOSIS — S93409A Sprain of unspecified ligament of unspecified ankle, initial encounter: Secondary | ICD-10-CM | POA: Diagnosis not present

## 2012-12-30 DIAGNOSIS — J438 Other emphysema: Secondary | ICD-10-CM | POA: Diagnosis not present

## 2012-12-30 DIAGNOSIS — K219 Gastro-esophageal reflux disease without esophagitis: Secondary | ICD-10-CM | POA: Diagnosis not present

## 2012-12-30 DIAGNOSIS — E039 Hypothyroidism, unspecified: Secondary | ICD-10-CM | POA: Diagnosis not present

## 2012-12-30 DIAGNOSIS — G8929 Other chronic pain: Secondary | ICD-10-CM | POA: Diagnosis not present

## 2013-01-01 ENCOUNTER — Ambulatory Visit (HOSPITAL_COMMUNITY)
Admission: RE | Admit: 2013-01-01 | Discharge: 2013-01-01 | Disposition: A | Discharge status: Home / Self Care | Payer: Medicare Other | Source: Ambulatory Visit | Attending: Internal Medicine | Admitting: Internal Medicine

## 2013-01-01 ENCOUNTER — Other Ambulatory Visit (HOSPITAL_COMMUNITY): Payer: Self-pay | Admitting: Internal Medicine

## 2013-01-01 DIAGNOSIS — S99929A Unspecified injury of unspecified foot, initial encounter: Secondary | ICD-10-CM | POA: Diagnosis not present

## 2013-01-01 DIAGNOSIS — M25579 Pain in unspecified ankle and joints of unspecified foot: Secondary | ICD-10-CM | POA: Insufficient documentation

## 2013-01-01 DIAGNOSIS — M25539 Pain in unspecified wrist: Secondary | ICD-10-CM | POA: Insufficient documentation

## 2013-01-01 DIAGNOSIS — S93409A Sprain of unspecified ligament of unspecified ankle, initial encounter: Secondary | ICD-10-CM

## 2013-01-01 DIAGNOSIS — S63509A Unspecified sprain of unspecified wrist, initial encounter: Secondary | ICD-10-CM

## 2013-01-01 DIAGNOSIS — Z6826 Body mass index (BMI) 26.0-26.9, adult: Secondary | ICD-10-CM

## 2013-01-01 DIAGNOSIS — M79609 Pain in unspecified limb: Secondary | ICD-10-CM | POA: Diagnosis not present

## 2013-01-01 DIAGNOSIS — S59919A Unspecified injury of unspecified forearm, initial encounter: Secondary | ICD-10-CM | POA: Diagnosis not present

## 2013-01-01 DIAGNOSIS — S8990XA Unspecified injury of unspecified lower leg, initial encounter: Secondary | ICD-10-CM | POA: Diagnosis not present

## 2013-01-01 DIAGNOSIS — S99919A Unspecified injury of unspecified ankle, initial encounter: Secondary | ICD-10-CM | POA: Diagnosis not present

## 2013-01-01 DIAGNOSIS — S59909A Unspecified injury of unspecified elbow, initial encounter: Secondary | ICD-10-CM | POA: Diagnosis not present

## 2013-02-13 DIAGNOSIS — H70099 Acute mastoiditis with other complications, unspecified ear: Secondary | ICD-10-CM | POA: Diagnosis not present

## 2013-02-13 DIAGNOSIS — J069 Acute upper respiratory infection, unspecified: Secondary | ICD-10-CM | POA: Diagnosis not present

## 2013-02-13 DIAGNOSIS — J392 Other diseases of pharynx: Secondary | ICD-10-CM | POA: Diagnosis not present

## 2013-02-26 ENCOUNTER — Telehealth: Payer: Self-pay | Admitting: Internal Medicine

## 2013-02-26 NOTE — Telephone Encounter (Signed)
Spoke with pt- had her check the expiration date on containers. They dont expire until 02/2014. Pt stated she had been doing well until last week when she started having loose, mushy stools. She just finished antibiotics last Wednesday for an ear infection and a "throat infection". Advised pt to turn in stool containers asap and I would send over orders that are in computer from 09/2012 phone note. Per [facility] ov in 09/2012 she informed pt to turn in samples if she had any further episodes of diarrhea. Advised pt to make sure she got enough fluids. Pt has ov with RMR on Friday- 03/02/13

## 2013-02-26 NOTE — Telephone Encounter (Signed)
Tried to call pt- NA and voicemail has not been set up yet. 

## 2013-02-26 NOTE — Telephone Encounter (Signed)
C. difficile needs to be done ASAP

## 2013-02-26 NOTE — Telephone Encounter (Signed)
Patient is having diarrhea for 10 straight days, she will see Dr. Jena Gauss on 5/9 and shes asking can she use the kit that she already has from the last time she was to be tested for C-Diff and couldn't get sample or does she need a new one ? Please advise!!

## 2013-02-26 NOTE — Telephone Encounter (Signed)
Pt said she will try to turn in samples today.

## 2013-02-27 ENCOUNTER — Other Ambulatory Visit: Payer: Self-pay | Admitting: Gastroenterology

## 2013-02-27 DIAGNOSIS — R109 Unspecified abdominal pain: Secondary | ICD-10-CM | POA: Diagnosis not present

## 2013-02-27 DIAGNOSIS — R197 Diarrhea, unspecified: Secondary | ICD-10-CM | POA: Diagnosis not present

## 2013-02-28 LAB — GIARDIA ANTIGEN: Giardia Screen (EIA): NEGATIVE

## 2013-02-28 NOTE — Telephone Encounter (Signed)
Mary Hart, it looks like stool culture done instead of CDiff PCR as requested by RMR.

## 2013-02-28 NOTE — Telephone Encounter (Signed)
The orders and containers were from December and the cdiff orders had the wrong test code. They have straightened it out and we should get the results soon.

## 2013-02-28 NOTE — Progress Notes (Signed)
Quick Note:  C.diff is negative. We will f/u other stool studies as available. May be antibiotic associated diarrhea. Recommend probiotic for one month. Call if symptoms persist. ______

## 2013-03-02 ENCOUNTER — Telehealth: Payer: Self-pay | Admitting: Gastroenterology

## 2013-03-02 ENCOUNTER — Telehealth: Payer: Self-pay | Admitting: Internal Medicine

## 2013-03-02 ENCOUNTER — Encounter: Payer: Self-pay | Admitting: Internal Medicine

## 2013-03-02 ENCOUNTER — Ambulatory Visit (INDEPENDENT_AMBULATORY_CARE_PROVIDER_SITE_OTHER): Payer: Medicare Other | Admitting: Internal Medicine

## 2013-03-02 VITALS — BP 121/78 | HR 65 | Temp 97.8°F | Ht 63.5 in | Wt 141.6 lb

## 2013-03-02 DIAGNOSIS — R197 Diarrhea, unspecified: Secondary | ICD-10-CM

## 2013-03-02 DIAGNOSIS — K219 Gastro-esophageal reflux disease without esophagitis: Secondary | ICD-10-CM

## 2013-03-02 LAB — CLOSTRIDIUM DIFFICILE BY PCR: Toxigenic C. Difficile by PCR: DETECTED — CR

## 2013-03-02 MED ORDER — VANCOMYCIN HCL 125 MG PO CAPS: 125.0000 mg | ORAL_CAPSULE | Freq: Four times a day (QID) | ORAL | Status: DC

## 2013-03-02 NOTE — Telephone Encounter (Signed)
cdiff positive; I informed pt; she has already started Vancomycin

## 2013-03-02 NOTE — Telephone Encounter (Signed)
Dr. Jena Gauss has already addressed.

## 2013-03-02 NOTE — Telephone Encounter (Signed)
[facility] called. Pt has positive CDIFF PCR

## 2013-03-02 NOTE — Progress Notes (Signed)
Primary Care Physician:  Cassell Smiles., MD Primary Gastroenterologist:  Dr. Jena Gauss  Pre-Procedure History & Physical: HPI:  Mary Hart is a 72 y.o. female here for followup of GERD and constipation. No watery diarrhea 15 days ago in association with antibiotics for left ear infection. Complains of abdominal bloating and diffuse mild tenderness some decrease in diarrhea. No nausea or vomiting ability to maintain oral intake unaffected. History of C. difficile with very similar presentation several months ago. C. difficile stool sample [facility] but I found no results as of yet. No fever chills.  Past Medical History  Diagnosis Date  . GERD (gastroesophageal reflux disease)   . Hypothyroid   . Arthritis   . Neuropathy   . Depression   . IBS (irritable bowel syndrome)   . Clostridium difficile infection     treated with vancomycin  . Internal hemorrhoids   . Diverticula of colon     minimal    Past Surgical History  Procedure Laterality Date  . Colonoscopy  06/20/2008    [facility] Dr. Damita Dunnings external hemorrhoids  . Anterior cervical fusion x 2    . Joint replacement  09/2010    Thumb  . Vesicovaginal fistula closure w/ tah    . Cholecystectomy    . Esophagogastroduodenoscopy  11/25/10    Montell Leopard- tubular esophagus s/p of 56 dilator from a small HH/otherwise normal  . Abdominal hysterectomy    . Colonoscopy  10/12/2012    Dr. Jena Gauss- internal hemorrhoids, very early, few, sigmoid diverticula o/w normal appearing colon and terminal ileum. + cdiff on stool samples taken.    Prior to Admission medications   Medication Sig Start Date End Date Taking? Authorizing Provider  acyclovir (ZOVIRAX) 400 MG tablet Take 400 mg by mouth daily.    Yes Historical Provider, MD  ALPRAZolam Prudy Feeler) 0.25 MG tablet Take 1 tablet by mouth 3 (three) times daily as needed. 08/09/12  Yes Historical Provider, MD  Calcium Carbonate-Vitamin D (CALCIUM 600/VITAMIN D PO) Take 1 tablet by mouth 2 (two) times  daily. OTC medication, the Vit D is 800 IU   Yes Historical Provider, MD  gabapentin (NEURONTIN) 300 MG capsule Take 600 mg by mouth at bedtime.    Yes Historical Provider, MD  Lactobacillus (ACIDOPHILUS PO) Take 1 mg by mouth 1 day or 1 dose.    Yes Historical Provider, MD  levothyroxine (SYNTHROID, LEVOTHROID) 25 MCG tablet Take 25 mcg by mouth daily.     Yes Historical Provider, MD  magnesium gluconate (MAGONATE) 500 MG tablet Take 500 mg by mouth 2 (two) times daily.   Yes Historical Provider, MD  ondansetron (ZOFRAN) 4 MG tablet Take 4 mg by mouth every 8 (eight) hours as needed for nausea.   Yes Historical Provider, MD  pantoprazole (PROTONIX) 40 MG tablet Take 1 tablet (40 mg total) by mouth 2 (two) times daily before a meal. 09/27/12  Yes Tiffany Kocher, PA-C  polyethylene glycol (MIRALAX / GLYCOLAX) packet Take 17 g by mouth daily.    Yes Historical Provider, MD  promethazine (PHENERGAN) 25 MG tablet Take 1 tablet (25 mg total) by mouth every 8 (eight) hours as needed. 10/03/12  Yes Tiffany Kocher, PA-C  vancomycin (VANCOCIN HCL) 125 MG capsule Take 1 capsule (125 mg total) by mouth 4 (four) times daily. 03/02/13   Corbin Ade, MD    Allergies as of 03/02/2013 - Review Complete 03/02/2013  Allergen Reaction Noted  . Codeine    . Hydromorphone hcl    .  Morphine    . Moxifloxacin    . Sulfonamide derivatives      Family History  Problem Relation Age of Onset  . Colon cancer Maternal Uncle     History   Social History  . Marital Status: Married    Spouse Name: N/A    Number of Children: N/A  . Years of Education: N/A   Occupational History  . Not on file.   Social History Main Topics  . Smoking status: Current Every Day Smoker -- 0.50 packs/day for 50 years    Types: Cigarettes  . Smokeless tobacco: Not on file     Comment: 1/2 pack per day  . Alcohol Use: Yes  . Drug Use: No  . Sexually Active: Not on file   Other Topics Concern  . Not on file   Social History  Narrative  . No narrative on file    Review of Systems: See HPI, otherwise negative ROS  Physical Exam: BP 121/78  Pulse 65  Temp(Src) 97.8 F (36.6 C) (Oral)  Ht 5' 3.5" (1.613 m)  Wt 141 lb 9.6 oz (64.229 kg)  BMI 24.69 kg/m2 General:   Alert,  Well-developed, well-nourished, pleasant. Does not appear toxic. What appears not to feel well..cooperative in NAD Skin:  Intact without significant lesions or rashes. Eyes:  Sclera clear, no icterus.   Conjunctiva pink. Ears:  Normal auditory acuity. Nose:  No deformity, discharge,  or lesions. Mouth:  No deformity or lesions. Neck:  Supple; no masses or thyromegaly. No significant cervical adenopathy. Lungs:  Clear throughout to auscultation.   No wheezes, crackles, or rhonchi. No acute distress. Heart:  Regular rate and rhythm; no murmurs, clicks, rubs,  or gallops. Abdomen: Nondistended. Positive bowel sounds soft with mild diffuse tenderness. No mass organomegaly. No rebound Pulses:  Normal pulses noted. Extremities:  Without clubbing or edema.  Impression/Plan:  72 year old lady with recurrent watery diarrhea following antibiotic exposure. History C. difficile. She likely has C. difficile once again. C. difficile toxin assay status unknown. This lady needs to be treated for C. difficile regardless of PCR results.   Recommendations: Obviously, hold MiraLax for now. Vancomycin 125 mg 4 times a day x10 days. Stop probiotic. Stop Protonix for now. Low residue diet. Maintain adequate fluid intake. Do not use any antidiarrheal medications. Telephone followup the first of the week.

## 2013-03-02 NOTE — Progress Notes (Signed)
Quick Note:  Results must have been pending when I saw this on 02/28/13. Must have seen the "Not Detected" noted in 1st column which is actually the range of normal. I will notify RMR as he saw the patient. ______

## 2013-03-02 NOTE — Patient Instructions (Addendum)
Vancomycin 125 mg 4 x daily x 10 days  Low residue diet  Stop Miralax and probiotic for now  No antidiarrheal meds for now  Stop Protonix for now  Telephone follow-up next week  Cc PCP

## 2013-03-03 LAB — STOOL CULTURE

## 2013-03-05 ENCOUNTER — Telehealth: Payer: Self-pay | Admitting: Internal Medicine

## 2013-03-05 NOTE — Telephone Encounter (Signed)
Spoke with pt. She is feeling better but she does feel tired. No diarrhea, no gas, no bloating. We went over the foods she can have while on a low residue diet and she said she understood.   Pt wants to know if she can have an rx for diflucan. She said she always gets a yeast infection when she takes any antibiotics and would like to have this ready if she needs it.   Pt uses [facility]- [place].

## 2013-03-05 NOTE — Telephone Encounter (Signed)
Pt called to let us know that the vancomycin has helped her symptoms, but she is just weak and sore. She has questions about the diet sheet she was given about how long will she need to follow that because she is tired of eating cream potatoes. Please advise [phone]

## 2013-03-12 ENCOUNTER — Telehealth: Payer: Self-pay

## 2013-03-12 NOTE — Telephone Encounter (Signed)
Pt will come in tomorrow to see AS at 11:30. Darl Pikes, it would not let me put this on the schedule.

## 2013-03-12 NOTE — Telephone Encounter (Signed)
Pt called- she was doing well until Saturday morning- when she woke up she had a large BM that was "real mushy" and started having L side abd pain, gas, nausea, chills and feels weak. She is not having diarrhea but she feels like she has to have a BM and feels like there is a lot of pressure. She will take her last 2 vancomycin today. She is concerned and wants to know what she should do.   Please advise.

## 2013-03-12 NOTE — Telephone Encounter (Signed)
Pt is now on the schedule

## 2013-03-12 NOTE — Telephone Encounter (Signed)
These new symptoms or unusual. She should continue taking her vancomycin for C. difficile infection. Offer her an urgent with extender

## 2013-03-13 ENCOUNTER — Other Ambulatory Visit: Payer: Self-pay | Admitting: Gastroenterology

## 2013-03-13 ENCOUNTER — Other Ambulatory Visit: Payer: Self-pay

## 2013-03-13 ENCOUNTER — Ambulatory Visit (HOSPITAL_COMMUNITY)
Admission: RE | Admit: 2013-03-13 | Discharge: 2013-03-13 | Disposition: A | Discharge status: Home / Self Care | Payer: Medicare Other | Source: Ambulatory Visit | Attending: Gastroenterology | Admitting: Gastroenterology

## 2013-03-13 ENCOUNTER — Encounter: Payer: Self-pay | Admitting: Gastroenterology

## 2013-03-13 ENCOUNTER — Ambulatory Visit (INDEPENDENT_AMBULATORY_CARE_PROVIDER_SITE_OTHER): Payer: Medicare Other | Admitting: Gastroenterology

## 2013-03-13 VITALS — BP 119/76 | HR 73 | Temp 98.1°F | Ht 64.0 in | Wt 138.2 lb

## 2013-03-13 DIAGNOSIS — Z8719 Personal history of other diseases of the digestive system: Secondary | ICD-10-CM | POA: Insufficient documentation

## 2013-03-13 DIAGNOSIS — R1032 Left lower quadrant pain: Secondary | ICD-10-CM

## 2013-03-13 LAB — BASIC METABOLIC PANEL
BUN: 8 mg/dL (ref 6–23)
CO2: 26 mEq/L (ref 19–32)
Calcium: 9.2 mg/dL (ref 8.4–10.5)
Glucose, Bld: 83 mg/dL (ref 70–99)
Sodium: 138 mEq/L (ref 135–145)

## 2013-03-13 MED ORDER — IOHEXOL 300 MG/ML  SOLN
100.0000 mL | Freq: Once | INTRAMUSCULAR | Status: AC | PRN
  Administered 2013-03-13: 100 mL via INTRAVENOUS

## 2013-03-13 MED ORDER — PROMETHAZINE HCL 25 MG PO TABS: 12.5000 mg | ORAL_TABLET | Freq: Three times a day (TID) | ORAL | Status: DC | PRN

## 2013-03-13 NOTE — Progress Notes (Signed)
Quick Note:  Negative CT> Reviewed with patient.  I have asked her to obtain a UA. She will go to lab tomorrow. LLQ improved somewhat. Question underlying constipation. Resume Miralax. May be a good candidate for Linzess. Need to document negative Cdiff.   Mary Hart, please fax order for UA to [facility]. Patient going on 5.21 ______

## 2013-03-13 NOTE — Patient Instructions (Addendum)
Please go and complete the CT scan. You will need to do blood work prior.   We will call with the results.

## 2013-03-13 NOTE — Progress Notes (Signed)
Referring Provider: Elfredia Nevins, MD Primary Care Physician:  Cassell Smiles., MD Primary GI: Dr. Jena Gauss   Chief Complaint  Patient presents with  . Follow-up    HPI:   72 year old female presents today as urgent follow-up due to left-sided abdominal pain, nausea, chills, and weakness. Started on treatment for Cdiff with vanc on 5/9. Took last vancomycin yesterday. Last Wednesday was feeling almost normal. Satruday morning woke up with LLQ/LUQ pain. Large bowel movement, very mushy. Started with cold chills/nausea. No fever per our records. Zofran every 8 hours since Saturday morning. Eating potato soup. So weak. Feels like a truck ran over her. Feels like she has horrible taste in her mouth. Feels like she has to go all the time, pressure. Yesterday large amounts of gas and only two small pieces of bowel movement. Flat, ribbon-like stool this morning. Doesn't feel like emptying. Persistent LLQ pain, worse with more gas. Allergy flare up. No vomiting. Urine clear, drinking plenty of fluids. States her normal temp is 96.   Past Medical History  Diagnosis Date  . GERD (gastroesophageal reflux disease)   . Hypothyroid   . Arthritis   . Neuropathy   . Depression   . IBS (irritable bowel syndrome)   . Clostridium difficile infection     treated with vancomycin  . Internal hemorrhoids   . Diverticula of colon     minimal    Past Surgical History  Procedure Laterality Date  . Colonoscopy  06/20/2008    [facility] Dr. Damita Dunnings external hemorrhoids  . Anterior cervical fusion x 2    . Joint replacement  09/2010    Thumb  . Vesicovaginal fistula closure w/ tah    . Cholecystectomy    . Esophagogastroduodenoscopy  11/25/10    Rourk- tubular esophagus s/p of 56 dilator from a small HH/otherwise normal  . Abdominal hysterectomy    . Colonoscopy  10/12/2012    Dr. Jena Gauss- internal hemorrhoids, very early, few, sigmoid diverticula o/w normal appearing colon and terminal ileum. + cdiff on  stool samples taken.    Current Outpatient Prescriptions  Medication Sig Dispense Refill  . ALPRAZolam (XANAX) 0.25 MG tablet Take 1 tablet by mouth 3 (three) times daily as needed.      . Calcium Carbonate-Vitamin D (CALCIUM 600/VITAMIN D PO) Take 1 tablet by mouth 2 (two) times daily. OTC medication, the Vit D is 800 IU      . gabapentin (NEURONTIN) 300 MG capsule Take 600 mg by mouth at bedtime.       . Lactobacillus (ACIDOPHILUS PO) Take 1 mg by mouth 1 day or 1 dose.       . levothyroxine (SYNTHROID, LEVOTHROID) 25 MCG tablet Take 25 mcg by mouth daily.        . magnesium gluconate (MAGONATE) 500 MG tablet Take 500 mg by mouth 2 (two) times daily.      . ondansetron (ZOFRAN) 4 MG tablet Take 4 mg by mouth every 8 (eight) hours as needed for nausea.      . pantoprazole (PROTONIX) 40 MG tablet Take 1 tablet (40 mg total) by mouth 2 (two) times daily before a meal.  60 tablet  3  . polyethylene glycol (MIRALAX / GLYCOLAX) packet Take 17 g by mouth daily.       Marland Kitchen acyclovir (ZOVIRAX) 400 MG tablet Take 400 mg by mouth daily.       . promethazine (PHENERGAN) 25 MG tablet Take 0.5 tablets (12.5 mg total) by mouth every 8 (  eight) hours as needed.  30 tablet  0  . vancomycin (VANCOCIN HCL) 125 MG capsule Take 1 capsule (125 mg total) by mouth 4 (four) times daily.  40 capsule  0   No current facility-administered medications for this visit.    Allergies as of 03/13/2013 - Review Complete 03/13/2013  Allergen Reaction Noted  . Codeine    . Hydromorphone hcl    . Morphine    . Moxifloxacin    . Sulfonamide derivatives      Family History  Problem Relation Age of Onset  . Colon cancer Maternal Uncle     History   Social History  . Marital Status: Married    Spouse Name: N/A    Number of Children: N/A  . Years of Education: N/A   Social History Main Topics  . Smoking status: Current Every Day Smoker -- 0.50 packs/day for 50 years    Types: Cigarettes  . Smokeless tobacco: None      Comment: 1/2 pack per day  . Alcohol Use: Yes  . Drug Use: No  . Sexually Active: None   Other Topics Concern  . None   Social History Narrative  . None    Review of Systems: Negative unless mentioned in HPI  Physical Exam: BP 119/76  Pulse 73  Temp(Src) 98.1 F (36.7 C) (Oral)  Ht 5\' 4"  (1.626 m)  Wt 138 lb 3.2 oz (62.687 kg)  BMI 23.71 kg/m2 General:   Alert and oriented. Appears tired Head:  Normocephalic and atraumatic. Eyes:  Conjuctiva clear without scleral icterus. Mouth:  Oral mucosa pink and moist. Good dentition. No lesions. Heart:  S1, S2 present without murmurs, rubs, or gallops. Regular rate and rhythm. Abdomen:  +BS, soft, significantly TTP LLQ and non-distended. No rebound or guarding. No HSM or masses noted. Msk:  Symmetrical without gross deformities. Normal posture. Extremities:  Without edema. Neurologic:  Alert and  oriented x4;  grossly normal neurologically. Psych:  Alert and cooperative. Normal mood and affect.

## 2013-03-14 NOTE — Assessment & Plan Note (Signed)
72 year old female recently completing treatment for Cdiff (last dose of Vancomycin) with acute onset of LLQ/LUQ abdominal pain on Saturday. Associated chills, nausea, no fever. No vomiting. Urine clear, no signs of dehydration. Vital signs stable in office, physical exam with TTP but no distension or peritoneal signs. She appears to also have a head cold. Stat CT now, BMP. Recheck Cdiff PCR. If still positive (after ensuring no acute issues going on after CT scan reviewed), would recommend Dificid.

## 2013-03-15 LAB — URINALYSIS
Bilirubin Urine: NEGATIVE
Ketones, ur: NEGATIVE mg/dL
Specific Gravity, Urine: 1.026 (ref 1.005–1.030)
Urobilinogen, UA: 0.2 mg/dL (ref 0.0–1.0)
pH: 5.5 (ref 5.0–8.0)

## 2013-03-15 NOTE — Progress Notes (Signed)
Cc PCP 

## 2013-03-15 NOTE — Progress Notes (Signed)
Quick Note:  Tried to call pt- LM with female to return call. ______

## 2013-03-15 NOTE — Progress Notes (Signed)
Quick Note:  Negative urinalysis. How is patient doing?   ______

## 2013-03-16 NOTE — Progress Notes (Signed)
Quick Note:  Good news! Negative Cdiff. ______

## 2013-03-16 NOTE — Progress Notes (Signed)
Quick Note:  Pt aware, still having a lot of gas, bloating, nausea and watery/mushy stool ______

## 2013-03-20 ENCOUNTER — Telehealth: Payer: Self-pay

## 2013-03-20 NOTE — Telephone Encounter (Signed)
Pt called- she has been having Nausea, chills, gas and bloating all weekend. Last bm was this morning and she has been "shakey" ever since. BM's are "mushy".  Pt wants to know what she should do. Please advise.

## 2013-03-21 ENCOUNTER — Other Ambulatory Visit: Payer: Self-pay | Admitting: Gastroenterology

## 2013-03-21 ENCOUNTER — Other Ambulatory Visit: Payer: Self-pay

## 2013-03-21 DIAGNOSIS — R197 Diarrhea, unspecified: Secondary | ICD-10-CM

## 2013-03-21 MED ORDER — FIDAXOMICIN 200 MG PO TABS: 200.0000 mg | ORAL_TABLET | Freq: Two times a day (BID) | ORAL | Status: DC

## 2013-03-21 NOTE — Progress Notes (Signed)
Quick Note:  Pt is aware. ______ 

## 2013-03-21 NOTE — Progress Notes (Signed)
Quick Note:  Noted.  Addressed under phone call. We are going to treat with Dificid. High concern for recurrent Cdiff. ______

## 2013-03-21 NOTE — Telephone Encounter (Signed)
PT called and per Raynelle Fanning I told pt that she has a note to Mary Hart and they will be getting back with her.

## 2013-03-21 NOTE — Telephone Encounter (Signed)
No fever, a little bit of diarrhea.

## 2013-03-21 NOTE — Telephone Encounter (Signed)
Pt aware, lab order faxed to lab. 

## 2013-03-21 NOTE — Telephone Encounter (Signed)
Any fever? Is she having diarrhea again? Although she has a negative Cdiff PCR on file, I'm concerned if this is truly accurate. She has been treated for this in the past, most recently early May with Vanc.  I feel in this scenario, we need to treat again for Cdiff. I don't see where she has had Dificid. I have sent this to the pharmacy. If there are any issues with insurance payment, we need to contact the rep or do a stat PA.   Use nausea med prn, stop PPI right now until treatment finished. May take Zantac OTC in interim. Let's get a CBC, too.

## 2013-03-22 DIAGNOSIS — IMO0002 Reserved for concepts with insufficient information to code with codable children: Secondary | ICD-10-CM | POA: Diagnosis not present

## 2013-03-22 DIAGNOSIS — S83289A Other tear of lateral meniscus, current injury, unspecified knee, initial encounter: Secondary | ICD-10-CM | POA: Diagnosis not present

## 2013-03-22 DIAGNOSIS — R197 Diarrhea, unspecified: Secondary | ICD-10-CM | POA: Diagnosis not present

## 2013-03-22 LAB — CBC WITH DIFFERENTIAL/PLATELET
Basophils Absolute: 0 K/uL (ref 0.0–0.1)
Basophils Relative: 1 % (ref 0–1)
Eosinophils Absolute: 0.1 K/uL (ref 0.0–0.7)
Eosinophils Relative: 3 % (ref 0–5)
HCT: 43.6 % (ref 36.0–46.0)
Hemoglobin: 14.7 g/dL (ref 12.0–15.0)
Lymphocytes Relative: 32 % (ref 12–46)
Lymphs Abs: 1.6 K/uL (ref 0.7–4.0)
MCH: 31.1 pg (ref 26.0–34.0)
MCHC: 33.7 g/dL (ref 30.0–36.0)
MCV: 92.4 fL (ref 78.0–100.0)
Monocytes Absolute: 0.5 K/uL (ref 0.1–1.0)
Monocytes Relative: 11 % (ref 3–12)
Neutro Abs: 2.6 K/uL (ref 1.7–7.7)
Neutrophils Relative %: 53 % (ref 43–77)
Platelets: 276 K/uL (ref 150–400)
RBC: 4.72 MIL/uL (ref 3.87–5.11)
RDW: 14 % (ref 11.5–15.5)
WBC: 4.9 K/uL (ref 4.0–10.5)

## 2013-03-22 MED ORDER — VANCOMYCIN HCL 125 MG PO CAPS: ORAL_CAPSULE | ORAL | Status: DC

## 2013-03-22 NOTE — Telephone Encounter (Signed)
Pt called- Dificid is going to cost her $3000.00 and she cannot afford that. I called pharmacy- it is not covered at all by her insurance. I called pt assistance co for dificid- if the insurance will not cover it at all then there is nothing they can do to help. Called pt back- she has not had any diarrhea today, feels like she needs to go but cant now. +pain in Lside, she feels is d/t gas, +nausea, taking zofran and it helps, +chills, no vomiting, no fever. She is not taking miralax. Pt wants to know what she should do now. Please advise.

## 2013-03-22 NOTE — Telephone Encounter (Signed)
Discussed with Dr. Jena Gauss. He recommends going on vancomycin taper. RX done.  OV in 4 weeks.

## 2013-03-23 NOTE — Telephone Encounter (Signed)
Called and informed pt. Darl Pikes will schedule appt.

## 2013-03-23 NOTE — Telephone Encounter (Signed)
Pt is aware of OV on 6/27 at 0930 with LSL and appt card was mailed

## 2013-03-26 NOTE — Telephone Encounter (Signed)
Noted. Agree.

## 2013-04-06 DIAGNOSIS — Z1231 Encounter for screening mammogram for malignant neoplasm of breast: Secondary | ICD-10-CM | POA: Diagnosis not present

## 2013-04-06 DIAGNOSIS — R928 Other abnormal and inconclusive findings on diagnostic imaging of breast: Secondary | ICD-10-CM | POA: Diagnosis not present

## 2013-04-20 ENCOUNTER — Encounter: Payer: Self-pay | Admitting: Gastroenterology

## 2013-04-20 ENCOUNTER — Ambulatory Visit (INDEPENDENT_AMBULATORY_CARE_PROVIDER_SITE_OTHER): Payer: Medicare Other | Admitting: Gastroenterology

## 2013-04-20 VITALS — BP 113/67 | HR 74 | Temp 98.0°F | Ht 62.0 in | Wt 138.0 lb

## 2013-04-20 DIAGNOSIS — R197 Diarrhea, unspecified: Secondary | ICD-10-CM | POA: Diagnosis not present

## 2013-04-20 NOTE — Assessment & Plan Note (Addendum)
Recent C.Diff colitis in 02/2013. Patient has been very slow to return to baseline bowel function. Actually had negative C.Diff PCR the later part of May but do to her history she was given vanc taper. Finally seems to be back at baseline constipation. Took some Dulcolax last week.   1. Complete Vanc taper. 2. Add probiotic. 3. Add back protonix once Vanc complete as she is having GERD off PPI. 4. Restart Miralax prn. 5. She should always take probiotics when on antibiotics.  6. Call with further problems.

## 2013-04-20 NOTE — Patient Instructions (Addendum)
1. Complete vancomycin taper as scheduled. 2. Take 1 dose of MiraLax in the evening on days you do not have a bowel movement. 3. Began a probiotic daily. You may buy the one of your choice, consider Vear Clock colon health. 4. Resume protonix once you complete Vancomycin. 5. Make sure you take probiotics any time you are given antibiotics. 6. Please call us if you digestive system does not return to normal.

## 2013-04-20 NOTE — Progress Notes (Signed)
Primary Care Physician: Cassell Smiles., MD  Primary Gastroenterologist:  Roetta Sessions, MD    Chief Complaint  Patient presents with  . Follow-up    HPI: Mary Hart is a 72 y.o. female here for f/u recent recurrent diarrhea. H/O C.Diff colitis last 09/2012. Treated with vancomycin as she has previously been intolerant of Flagyl. She returned to her baseline constipation until 02/2013. She had to take antibiotics for sinusitis. 02/27/13 she had another +C.Diff PCR. Started on vancomycin on 03/02/2013. Due to ongoing diarrhea she had another C. difficile PCR done on 03/13/2013 which was negative. It was felt that she likely would benefit from a vancomycin taper. Currently on QOD. Right now taking Zantac for GERD. No diarrhea. Lots of GERD especially in AM. Lots of flatulence. Some left sided discomfort she feels is related to gas. No BM yesterday. Day before she had 3 BMs, first stool solid but second two were flat. Took Dulcolax last week. Before this came back, was taking Miralax every morning and eating Raisin Bran. Feels tired. Sleeping well. Corn made stomach swell/pain finally relief with BM. Trying to eat very bland. Not taking probiotic.    Current Outpatient Prescriptions  Medication Sig Dispense Refill  . acyclovir (ZOVIRAX) 400 MG tablet Take 400 mg by mouth daily.       Marland Kitchen ALPRAZolam (XANAX) 0.25 MG tablet Take 1 tablet by mouth 3 (three) times daily as needed.      . Calcium Carbonate-Vitamin D (CALCIUM 600/VITAMIN D PO) Take 1 tablet by mouth 2 (two) times daily. OTC medication, the Vit D is 800 IU      . gabapentin (NEURONTIN) 300 MG capsule Take 600 mg by mouth at bedtime.       . Lactobacillus (ACIDOPHILUS PO) Take 1 mg by mouth 1 day or 1 dose.       . levothyroxine (SYNTHROID, LEVOTHROID) 25 MCG tablet Take 25 mcg by mouth daily.        . magnesium gluconate (MAGONATE) 500 MG tablet Take 500 mg by mouth 2 (two) times daily.      . ondansetron (ZOFRAN) 4 MG tablet Take 4 mg  by mouth every 8 (eight) hours as needed for nausea.      . promethazine (PHENERGAN) 25 MG tablet Take 0.5 tablets (12.5 mg total) by mouth every 8 (eight) hours as needed.  30 tablet  0  . vancomycin (VANCOCIN HCL) 125 MG capsule One po QID X 7 days. One po BID X 7 days. One po QD X 7 days. One po QOD X 7 days. One po every 3 days for 14 days.  58 capsule  0  . pantoprazole (PROTONIX) 40 MG tablet Take 1 tablet (40 mg total) by mouth 2 (two) times daily before a meal.  60 tablet  3  . polyethylene glycol (MIRALAX / GLYCOLAX) packet Take 17 g by mouth daily.        No current facility-administered medications for this visit.    Allergies as of 04/20/2013 - Review Complete 04/20/2013  Allergen Reaction Noted  . Codeine    . Hydromorphone hcl    . Morphine    . Moxifloxacin    . Sulfonamide derivatives      ROS:  General:see hpi ENT: Negative for hoarseness, difficulty swallowing , nasal congestion. CV: Negative for chest pain, angina, palpitations, dyspnea on exertion, peripheral edema.  Respiratory: Negative for dyspnea at rest, dyspnea on exertion, cough, sputum, wheezing.  GI: See history of present illness. GU:  Negative for dysuria, hematuria, urinary incontinence, urinary frequency, nocturnal urination.  Endo: Negative for unusual weight change.    Physical Examination:   BP 113/67  Pulse 74  Temp(Src) 98 F (36.7 C) (Oral)  Ht 5\' 2"  (1.575 m)  Wt 138 lb (62.596 kg)  BMI 25.23 kg/m2  General: Well-nourished, well-developed in no acute distress.  Eyes: No icterus. Mouth: Oropharyngeal mucosa moist and pink , no lesions erythema or exudate. Lungs: Clear to auscultation bilaterally.  Heart: Regular rate and rhythm, no murmurs rubs or gallops.  Abdomen: Bowel sounds are normal, nontender, nondistended, no hepatosplenomegaly or masses, no abdominal bruits or hernia , no rebound or guarding.   Extremities: No lower extremity edema. No clubbing or deformities. Neuro: Alert  and oriented x 4   Skin: Warm and dry, no jaundice.   Psych: Alert and cooperative, normal mood and affect.

## 2013-04-23 NOTE — Progress Notes (Signed)
Cc PCP 

## 2013-04-24 DIAGNOSIS — R928 Other abnormal and inconclusive findings on diagnostic imaging of breast: Secondary | ICD-10-CM | POA: Diagnosis not present

## 2013-04-24 DIAGNOSIS — R922 Inconclusive mammogram: Secondary | ICD-10-CM | POA: Diagnosis not present

## 2013-05-08 DIAGNOSIS — Z961 Presence of intraocular lens: Secondary | ICD-10-CM | POA: Diagnosis not present

## 2013-05-08 DIAGNOSIS — H26499 Other secondary cataract, unspecified eye: Secondary | ICD-10-CM | POA: Diagnosis not present

## 2013-05-08 DIAGNOSIS — H353190 Nonexudative age-related macular degeneration, unspecified eye, stage unspecified: Secondary | ICD-10-CM | POA: Diagnosis not present

## 2013-05-28 DIAGNOSIS — H35369 Drusen (degenerative) of macula, unspecified eye: Secondary | ICD-10-CM | POA: Diagnosis not present

## 2013-05-28 DIAGNOSIS — H26499 Other secondary cataract, unspecified eye: Secondary | ICD-10-CM | POA: Diagnosis not present

## 2013-06-01 ENCOUNTER — Telehealth: Payer: Self-pay

## 2013-06-01 ENCOUNTER — Other Ambulatory Visit: Payer: Self-pay

## 2013-06-01 DIAGNOSIS — M25519 Pain in unspecified shoulder: Secondary | ICD-10-CM | POA: Diagnosis not present

## 2013-06-01 DIAGNOSIS — F411 Generalized anxiety disorder: Secondary | ICD-10-CM | POA: Diagnosis not present

## 2013-06-01 DIAGNOSIS — M75100 Unspecified rotator cuff tear or rupture of unspecified shoulder, not specified as traumatic: Secondary | ICD-10-CM | POA: Diagnosis not present

## 2013-06-01 DIAGNOSIS — IMO0002 Reserved for concepts with insufficient information to code with codable children: Secondary | ICD-10-CM | POA: Diagnosis not present

## 2013-06-01 DIAGNOSIS — R197 Diarrhea, unspecified: Secondary | ICD-10-CM

## 2013-06-01 NOTE — Telephone Encounter (Signed)
Agree 

## 2013-06-01 NOTE — Telephone Encounter (Signed)
Pt came by office, she has been under a lot of stress lately but on Wednesday she had a couple of episodes of loose stools. Thursday she had 6 episodes of diarrhea. This morning she has already had it twice. Pt stated she is feeling ok but she is worried about cdiff.(she just finished vanc in July for cdiff) Gave pt stool sample cup and told her I would call her if LSL felt we needed to check for cdiff. Spoke with LSL- pt does need cdiff by pcr. Called pt- LM informing her that we do need stool sample and order has been sent to the lab.

## 2013-06-04 ENCOUNTER — Telehealth: Payer: Self-pay | Admitting: Internal Medicine

## 2013-06-04 MED ORDER — VANCOMYCIN HCL 125 MG PO CAPS: 125.0000 mg | ORAL_CAPSULE | Freq: Four times a day (QID) | ORAL | Status: DC

## 2013-06-04 NOTE — Telephone Encounter (Signed)
Pt took stool to lab on Friday and was checking with [place] if we have gotten the results back yet. Please call patient to let her know. [phone]

## 2013-06-04 NOTE — Telephone Encounter (Signed)
Darl Pikes, please schedule ov with LSL.

## 2013-06-04 NOTE — Telephone Encounter (Signed)
Spoke with RMR- pt needs to start vanc 125mg  qid x10 days, tid x7 days, bid x 7 days and qd x 7 days. Continue probiotics, wash bathroom etc with clorox.   Follow up with LSL in 6-8 weeks.   Per RMR if pt gets cdiff again we will need to treat with Fidaxomicin.  I called pt and she is aware. Rx sent to [facility].

## 2013-06-04 NOTE — Telephone Encounter (Addendum)
pts cdiff results are positive. Results in epic. Please advise.

## 2013-06-04 NOTE — Telephone Encounter (Signed)
Would consider Dificid if she has another bout

## 2013-06-04 NOTE — Telephone Encounter (Signed)
Pt is aware of OV on 9/22 at 0830 with LSL and appt card was mailed

## 2013-06-05 NOTE — Progress Notes (Signed)
Quick Note:  Has been addressed by Dr. Jena Gauss. ______

## 2013-06-06 ENCOUNTER — Telehealth: Payer: Self-pay | Admitting: *Deleted

## 2013-06-06 NOTE — Telephone Encounter (Signed)
Pt called stating she thinks she has c-diff please advise [phone]

## 2013-06-06 NOTE — Telephone Encounter (Signed)
Spoke with Mary Hart- she is aware and taking the medication for cdiff. She has some concerns about taking care of her spouse and wants to know if she can just talk with RMR about the recurrent cdiff. She stated we tried to get her dificid last time she had cdiff and her insurance wouldn't pay for it and it was over $1000.00 and she didn't qualify for assistance. She has questions about fecal implants, that her daughter-in-law told her about.   She stated she just needs some reassurance and would prefer to speak with RMR. (she is aware that RMR is at the hospital today doing procedures)

## 2013-06-07 ENCOUNTER — Other Ambulatory Visit: Payer: Self-pay | Admitting: Internal Medicine

## 2013-06-07 DIAGNOSIS — R197 Diarrhea, unspecified: Secondary | ICD-10-CM

## 2013-06-07 DIAGNOSIS — A0472 Enterocolitis due to Clostridium difficile, not specified as recurrent: Secondary | ICD-10-CM

## 2013-06-07 NOTE — Telephone Encounter (Signed)
Called ID yesterday and left message for them to call us back about this pt.  Benedetto Goad, Dr. Jena Gauss wants this pt seen by them asap.

## 2013-06-07 NOTE — Telephone Encounter (Signed)
Referral has been faxed to [facility]

## 2013-06-07 NOTE — Telephone Encounter (Signed)
Called patient last evening and discussed her situation with her. 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 I recommend she continue vancomycin and we will make plans for her to see one of the ID physician down in [place] to get help with management.

## 2013-06-19 ENCOUNTER — Telehealth: Payer: Self-pay | Admitting: *Deleted

## 2013-06-19 MED ORDER — FLUCONAZOLE 150 MG PO TABS: 150.0000 mg | ORAL_TABLET | Freq: Once | ORAL | Status: DC

## 2013-06-19 MED ORDER — VANCOMYCIN HCL 125 MG PO CAPS: 125.0000 mg | ORAL_CAPSULE | Freq: Four times a day (QID) | ORAL | Status: DC

## 2013-06-19 NOTE — Telephone Encounter (Addendum)
Pt is experiencing increased symptoms. Mary Hart from [facility] called to see if we could move pt's appointment with Dr. Drue Second up from 07/04/13 at 4:00.  There are no openings at this time.  Pt requesting to be placed on a cancellation list if possible.  Andree Coss, RN

## 2013-06-19 NOTE — Telephone Encounter (Signed)
Pt is aware. She will call back if she doesn't get any better. Benedetto Goad, please check with ID and see if she can get in sooner if possible.

## 2013-06-19 NOTE — Telephone Encounter (Signed)
I have increased her Vanc back to QID.  Have her do this X 2 weeks.  This gives [place] time to get her into the ID clinic.  We need to see if they can see her any sooner.   Ideally, we would start Dificid. If she is not better by increasing this to QID, we need to contact her insurance company.   I sent Diflucan to her pharmacy.   May use ensure or boost as snacks to assist with nutrition. She does not need IV fluids right now. Make sure she drinks plenty of water to keep her urine clear, light yellow. Contact us if no improvement in next 24-48 hours.

## 2013-06-19 NOTE — Telephone Encounter (Signed)
ID is working to try to get her worked in sooner

## 2013-06-19 NOTE — Telephone Encounter (Signed)
Patient called this morning and spoke with CS. A phone note was sent to nurse to advise and call patient back. Patient is anxious to speak with JL. I told her that JL was aware and was still at lunch and I would let her know that patient was waiting for a reply. [phone]

## 2013-06-19 NOTE — Telephone Encounter (Signed)
Pt has been taking vanc. Was feeling better and for the last week and a half has been feeling terrible and weak. She will having bloating and cramping for a day and then have 4-5 soft stools for a day and keeps repeating this process for the past week. She is drinking plenty of fluids and trying to eat bland foods. Her appt with ID is 07/04/13.   Pt wants to know if she needs IV fluids or if she should try some Ensure to get some calories.   Pt is also requesting an rx for diflucan. She is having vaginal burning.

## 2013-06-19 NOTE — Telephone Encounter (Signed)
Pt called wanting to know if it's something we can do to get her strength up until she goes to another doctor. Please advise [phone]

## 2013-06-27 ENCOUNTER — Encounter: Payer: Self-pay | Admitting: Internal Medicine

## 2013-06-27 ENCOUNTER — Ambulatory Visit (INDEPENDENT_AMBULATORY_CARE_PROVIDER_SITE_OTHER): Payer: Medicare Other | Admitting: Internal Medicine

## 2013-06-27 VITALS — BP 146/73 | HR 71 | Temp 97.7°F | Wt 131.0 lb

## 2013-06-27 DIAGNOSIS — A0472 Enterocolitis due to Clostridium difficile, not specified as recurrent: Secondary | ICD-10-CM | POA: Diagnosis not present

## 2013-06-27 MED ORDER — RIFAXIMIN 200 MG PO TABS: 400.0000 mg | ORAL_TABLET | Freq: Two times a day (BID) | ORAL | Status: DC

## 2013-06-27 NOTE — Progress Notes (Signed)
[facility] NOTE  RFV: evaluation for recurrent c.difficile management possible FMT  Subjective:    Patient ID: Mary Hart, female    DOB: Apr 25, 1941, 72 y.o.   MRN: [id number]  HPI Mary Hart is a 72yo F who has been suffering from recurrent cdifficile infection. She reports that it first was diagnosed in Winter of 2013 but had symptoms starting the Fall. She states that she may have had antibiotics in the Fall of 2013 for bronchitis? Last fall starting having diarrhea, but initially thought to be IBS did not get diagnosed until Dec 2013 in setting of CSY and colon biospy. Has had vanco QID X 10 day which improved. March had bad ear infection, got antibiotics, then had repeat course of vanco x 10 day QID but still not completely back to normal. Then in July, had vanco 4 x day, and a tapered through first of August which worked, but then relapse 3 wks later, where she had 2 days of loose stools and restarted vanco which she is finishing today. Patient doesn't feel much better than when she started the recent course. diarhea stopped 3 days ago while on vancomycin. She rports having abdominal bloating, gassy feeling and also having small bowel movement with gas, then followed by diarrhea for 1-2 days.  Current Outpatient Prescriptions on File Prior to Visit  Medication Sig Dispense Refill  . levothyroxine (SYNTHROID, LEVOTHROID) 25 MCG tablet Take 25 mcg by mouth daily.        . promethazine (PHENERGAN) 25 MG tablet Take 0.5 tablets (12.5 mg total) by mouth every 8 (eight) hours as needed.  30 tablet  0  . vancomycin (VANCOCIN HCL) 125 MG capsule One po QID X 7 days. One po BID X 7 days. One po QD X 7 days. One po QOD X 7 days. One po every 3 days for 14 days.  58 capsule  0  . acyclovir (ZOVIRAX) 400 MG tablet Take 400 mg by mouth daily.       Marland Kitchen ALPRAZolam (XANAX) 0.25 MG tablet Take 1 tablet by mouth 3 (three) times daily as needed.      . Calcium Carbonate-Vitamin D (CALCIUM 600/VITAMIN D PO)  Take 1 tablet by mouth 2 (two) times daily. OTC medication, the Vit D is 800 IU      . fluconazole (DIFLUCAN) 150 MG tablet Take 1 tablet (150 mg total) by mouth once. May repeat once in 48 hours if no improvement.  2 tablet  0  . gabapentin (NEURONTIN) 300 MG capsule Take 600 mg by mouth at bedtime.       . Lactobacillus (ACIDOPHILUS PO) Take 1 mg by mouth 1 day or 1 dose.       . magnesium gluconate (MAGONATE) 500 MG tablet Take 500 mg by mouth 2 (two) times daily.      . ondansetron (ZOFRAN) 4 MG tablet Take 4 mg by mouth every 8 (eight) hours as needed for nausea.      . pantoprazole (PROTONIX) 40 MG tablet Take 1 tablet (40 mg total) by mouth 2 (two) times daily before a meal.  60 tablet  3  . polyethylene glycol (MIRALAX / GLYCOLAX) packet Take 17 g by mouth daily.        No current facility-administered medications on file prior to visit.   Active Ambulatory Problems    Diagnosis Date Noted  . NAUSEA 11/18/2010  . DIARRHEA 11/18/2010  . ABDOMINAL PAIN-EPIGASTRIC 11/18/2010  . GERD (gastroesophageal reflux disease) 02/14/2012  .  Dysphagia 02/14/2012  . Constipation 05/04/2012  . Irritable bowel syndrome 05/04/2012  . Diverticulitis 09/18/2012  . LLQ pain 10/04/2012  . Candida vaginitis 10/04/2012   Resolved Ambulatory Problems    Diagnosis Date Noted  . No Resolved Ambulatory Problems   Past Medical History  Diagnosis Date  . Hypothyroid   . Arthritis   . Neuropathy   . Depression   . IBS (irritable bowel syndrome)   . Clostridium difficile infection   . Internal hemorrhoids   . Diverticula of colon    History  Substance Use Topics  . Smoking status: Current Every Day Smoker -- 0.50 packs/day for 50 years    Types: Cigarettes  . Smokeless tobacco: Not on file     Comment: 1/2 pack per day  . 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 family history includes Colon cancer in her maternal uncle.  Review of Systems #14 lb weight loss over the  last year. Constitutional: Negative for fever, chills, diaphoresis, activity change, appetite change, fatigue and unexpected weight change.  HENT: Negative for congestion, sore throat, rhinorrhea, sneezing, trouble swallowing and sinus pressure.  Eyes: Negative for photophobia and visual disturbance.  Respiratory: Negative for cough, chest tightness, shortness of breath, wheezing and stridor.  Cardiovascular: Negative for chest pain, palpitations and leg swelling.  Gastrointestinal: Negative for nausea, vomiting, abdominal pain, diarrhea, constipation, blood in stool, abdominal distention and anal bleeding.  Genitourinary: Negative for dysuria, hematuria, flank pain and difficulty urinating.  Musculoskeletal: Negative for myalgias, back pain, joint swelling, arthralgias and gait problem.  Skin: Negative for color change, pallor, rash and wound.  Neurological: Negative for dizziness, tremors, weakness and light-headedness.  Hematological: Negative for adenopathy. Does not bruise/bleed easily.  Psychiatric/Behavioral: Negative for behavioral problems, confusion, sleep disturbance, dysphoric mood, decreased concentration and agitation.       Objective:   Physical Exam BP 146/73  Pulse 71  Temp(Src) 97.7 F (36.5 C) (Oral)  Wt 131 lb (59.421 kg)  BMI 23.95 kg/m2 Physical Exam  Constitutional:  oriented to person, place, and time. appears well-developed, age appropriate, fatigue appearing No distress.  HENT:  Mouth/Throat: Oropharynx is clear and moist. No oropharyngeal exudate.  Cardiovascular: Normal rate, regular rhythm and normal heart sounds. Exam reveals no gallop and no friction rub.  No murmur heard.  Pulmonary/Chest: Effort normal and breath sounds normal. No respiratory distress. He has no wheezes.  Abdominal: Soft. Bowel sounds are normal. exhibits no distension. There is no tenderness.  Lymphadenopathy:   no cervical adenopathy.  Neurological: alert and oriented to person,  place, and time.  Skin: Skin is warm and dry. No rash noted. No erythema.  Psychiatric:  a normal mood and affect. His behavior is normal.          Assessment & Plan:   recurrent c.difficile= she is potentially a candidate for FMT. I will give FMT info and refer to Dr. Leone Payor. We will finish up current vancomycin course of with rifaximin chaser and  will need to arrange for family member to be tested, if she decides to pursue FMT

## 2013-06-28 ENCOUNTER — Telehealth: Payer: Self-pay | Admitting: *Deleted

## 2013-06-28 NOTE — Telephone Encounter (Signed)
Patient called to advise that the medication she was prescribed yesterday ,Rifaximin, 06/27/13 is $465 after insurance and she can not afford it and wants to know if there is an alternative. She also asked if the stool donor has to be a family member as she is having trouble finding someone in her family who has not had an antibiotic in the past 3 months and others have tatoos. Advised her will ask Dr Drue Second and give her a call back asap.  She also advised she has an appt with Dr Leone Payor on 07/03/13 and wanted Dr Drue Second to know.

## 2013-06-29 ENCOUNTER — Telehealth: Payer: Self-pay | Admitting: *Deleted

## 2013-06-29 NOTE — Telephone Encounter (Signed)
i will call ms. Vanderzanden in the morning to speak with her

## 2013-06-29 NOTE — Telephone Encounter (Signed)
Patient called to advise that she has gotten her son to agree to be her stool donor but he has a lot of questions and would like for Dr Drue Second to give her a call. She left [phone] as a call back number. Advised her that I sent the info to start her prior auth for the Xifaxan and should hear something by Monday and if they will not approve it we will try PAP or we can even change her medication but we are working on it.

## 2013-07-02 ENCOUNTER — Telehealth: Payer: Self-pay | Admitting: *Deleted

## 2013-07-02 NOTE — Telephone Encounter (Signed)
Pt concerned she cannot afford the medication ($436 for the rifaximin) and wants to know if it is necessary before her procedure.  Per pt, her son can come either Wednesday or Thursday for labs this week.  RN advised her to have her son call us so that we can work him into the lab schedule.  Pt also wants to let Dr. Drue Second know she and her son have questions for scheduling as well as before/during/after the procedure.  Pt does have an appointment with Dr. [facility] 07/03/13 at 10:45. RN suggested she bring her list of questions to that appointment as well.  Pt can be contacted on her cell phone: [phone].  Andree Coss, RN

## 2013-07-03 ENCOUNTER — Encounter: Payer: Self-pay | Admitting: Internal Medicine

## 2013-07-03 ENCOUNTER — Ambulatory Visit: Payer: Medicare Other | Admitting: Gastroenterology

## 2013-07-03 ENCOUNTER — Ambulatory Visit (INDEPENDENT_AMBULATORY_CARE_PROVIDER_SITE_OTHER): Payer: Medicare Other | Admitting: Internal Medicine

## 2013-07-03 VITALS — BP 120/62 | HR 80 | Ht 62.0 in | Wt 132.0 lb

## 2013-07-03 DIAGNOSIS — A0472 Enterocolitis due to Clostridium difficile, not specified as recurrent: Secondary | ICD-10-CM | POA: Diagnosis not present

## 2013-07-03 DIAGNOSIS — A0471 Enterocolitis due to Clostridium difficile, recurrent: Secondary | ICD-10-CM | POA: Insufficient documentation

## 2013-07-03 HISTORY — DX: Enterocolitis due to Clostridium difficile, recurrent: A04.71

## 2013-07-03 MED ORDER — PHILLIPS COLON HEALTH PO CAPS: 1.0000 | ORAL_CAPSULE | Freq: Every day | ORAL | Status: DC

## 2013-07-03 NOTE — Patient Instructions (Addendum)
You have been given a separate informational sheet regarding your tobacco use, the importance of quitting and local resources to help you quit. We have given you Florastor samples to take 1 daily. We will contact you after consulting with Dr Drue Second. CC: Elfredia Nevins MD

## 2013-07-03 NOTE — Progress Notes (Addendum)
Subjective:    Patient ID: Mary Hart, female    DOB: Dec 26, 1940, 72 y.o.   MRN: [id number]  HPI The patient is a very nice elderly woman with recurrent C diff colitis after taking antibiotics for URI in the Fall of 2013 she thinks.. She has seen Dr. Drue Second about a possible fecal microbiotica transplant. She cannot afford rifamaxin which has been prescribed by Dr. Drue Second. She is on a probiotic now.   Last fall starting having diarrhea, but initially thought to be IBS did not get diagnosed until Dec 2014 in setting of CSY and colon biospy. Has had vanco QID X 10 day which improved. March had bad ear infection, got antibiotics, then had repeat course of vanco x 10 day QID but still not completely back to normal. Then in July, had vanco 4 x day, and a tapered through first of August which worked, but then relapse 3 wks later, where she had 2 days of loose stools and restarted vanco which she is finishing today. Patient doesn't feel much better than when she started the recent course. She has just completed a taer of vancomycin and is about as good as she has been in a while - no diarrhea.  C diff PCR + 8 months ago and  + 4 months ago + 1 month ago colon bxs 09/2012 NL  Allergies  Allergen Reactions  . Codeine   . Hydromorphone Hcl   . Morphine   . Moxifloxacin     N/V/D, skin red. Tolerates Levaquin and Cipro.   . Sulfonamide Derivatives    Outpatient Prescriptions Prior to Visit  Medication Sig Dispense Refill  . acyclovir (ZOVIRAX) 400 MG tablet Take 400 mg by mouth daily.       . Lactobacillus (ACIDOPHILUS PO) Take 1 mg by mouth 1 day or 1 dose.       . levothyroxine (SYNTHROID, LEVOTHROID) 25 MCG tablet Take 25 mcg by mouth daily.        Marland Kitchen ALPRAZolam (XANAX) 0.25 MG tablet Take 1 tablet by mouth 3 (three) times daily as needed.      . Calcium Carbonate-Vitamin D (CALCIUM 600/VITAMIN D PO) Take 1 tablet by mouth 2 (two) times daily. OTC medication, the Vit D is 800 IU      .  fluconazole (DIFLUCAN) 150 MG tablet Take 1 tablet (150 mg total) by mouth once. May repeat once in 48 hours if no improvement.  2 tablet  0  . gabapentin (NEURONTIN) 300 MG capsule Take 600 mg by mouth at bedtime.       . magnesium gluconate (MAGONATE) 500 MG tablet Take 500 mg by mouth 2 (two) times daily.      . ondansetron (ZOFRAN) 4 MG tablet Take 4 mg by mouth every 8 (eight) hours as needed for nausea.      . pantoprazole (PROTONIX) 40 MG tablet Take 1 tablet (40 mg total) by mouth 2 (two) times daily before a meal.  60 tablet  3  . polyethylene glycol (MIRALAX / GLYCOLAX) packet Take 17 g by mouth daily.       . promethazine (PHENERGAN) 25 MG tablet Take 0.5 tablets (12.5 mg total) by mouth every 8 (eight) hours as needed.  30 tablet  0  . rifaximin (XIFAXAN) 200 MG tablet Take 2 tablets (400 mg total) by mouth 2 (two) times daily.  56 tablet  0  . vancomycin (VANCOCIN HCL) 125 MG capsule One po QID X 7 days. One po BID  X 7 days. One po QD X 7 days. One po QOD X 7 days. One po every 3 days for 14 days.  58 capsule  0   No facility-administered medications prior to visit.   Past Medical History  Diagnosis Date  . GERD (gastroesophageal reflux disease)   . Hypothyroid   . Arthritis   . Neuropathy   . Depression   . IBS (irritable bowel syndrome)   . Clostridium difficile infection     treated with vancomycin  . Internal hemorrhoids   . Diverticula of colon     minimal   Past Surgical History  Procedure Laterality Date  . Colonoscopy  06/20/2008    [facility] Dr. Damita Dunnings external hemorrhoids  . Anterior cervical fusion x 2    . Joint replacement  09/2010    Thumb  . Vesicovaginal fistula closure w/ tah    . Cholecystectomy    . Esophagogastroduodenoscopy  11/25/10    Rourk- tubular esophagus s/p of 56 dilator from a small HH/otherwise normal  . Abdominal hysterectomy    . Colonoscopy  10/12/2012    Dr. Jena Gauss- internal hemorrhoids, very early, few, sigmoid diverticula o/w normal  appearing colon and terminal ileum. + cdiff on stool samples taken. Segmental bx negative.   History   Social History  . Marital Status: Married    Spouse Name: N/A    Number of Children: 4  . Years of Education: N/A   Occupational History  . Retired    Social History Main Topics  . Smoking status: Current Every Day Smoker -- 0.50 packs/day for 50 years    Types: Cigarettes  . Smokeless tobacco: Never Used     Comment: 1/2 pack per day  . Alcohol Use: Yes  . Drug Use: No  . Sexual Activity: None   Other Topics Concern  . None   Social History Narrative   Married, 2 sons and 2 daughters   Husband chronically ill and she helps with care   She is retired   Family History  Problem Relation Age of Onset  . Colon cancer Maternal Uncle   . Dementia Father   . Dementia Mother   . Breast cancer Mother         Review of Systems    + allergies, sinus problems + arthritis All other ROS negative or as per HPI Objective:   Physical Exam General:  Well-developed, well-nourished and in no acute distress Eyes:  anicteric. ENT:   Mouth and posterior pharynx free of lesions.  Neck:   supple w/o thyromegaly or mass.  Lungs: Clear to auscultation bilaterally. Heart:  S1S2, no rubs, murmurs, gallops. Abdomen:  soft, non-tender, no hepatosplenomegaly, hernia, or mass and BS+.  Lymph:  no cervical or supraclavicular adenopathy. Extremities:   no edema Skin   Left thigh, right groin with erythematous papules Neuro:  A&O x 3.  Psych:  appropriate mood and  Affect.   Data Reviewed: Dr. Feliz Beam notes Colonoscopy, pathology [facility] notes    Assessment & Plan:  Recurrent colitis due to Clostridium difficile

## 2013-07-03 NOTE — Assessment & Plan Note (Addendum)
Seems like she may need fecal microbiotica transplant but also may have been cured - too soon to say. I advised her she may have post-infectious IBS at times.  1) Agree w/ rifamaxin chaser if she can get it - currently too costly 2) At least take Florastor 250 mg bid if she needs Abx in future but could need vancomycin 3) will discuss FMT and donor evaluation with Dr. Drue Second - may be able to watch and wait but if recurrent problems would do it. The risks and benefits as well as alternatives of endoscopic procedure(s) have been discussed and reviewed. All questions answered. The patient agrees to proceed.we also discussed risks of FMT. Advised it is experimental and we do not know for sure what long-term outcomes are though it can cure C diff.  Discussed w/ Dr. Drue Second and will observe for recurrent signs/sxs - if so treat w/ Abx and then plan FMT

## 2013-07-04 ENCOUNTER — Ambulatory Visit: Payer: Medicare Other | Admitting: Internal Medicine

## 2013-07-04 ENCOUNTER — Telehealth: Payer: Self-pay

## 2013-07-04 NOTE — Telephone Encounter (Signed)
The patient has been notified of this information and all questions answered.

## 2013-07-04 NOTE — Telephone Encounter (Signed)
Message copied by Donata Duff on Wed Jul 04, 2013  4:13 PM ------      Message from: Stan Head E      Created: Wed Jul 04, 2013  3:58 PM       Please let her know I talked w/ Dr. Drue Second            No fecal transplant right now but call me or Dr. Drue Second if having 3+ days in a row with diarrhea as that may be C diff coming back.      If that happens will Tx w/ vancomycin and then do the transplant ------

## 2013-07-16 ENCOUNTER — Encounter: Payer: Self-pay | Admitting: Gastroenterology

## 2013-07-16 ENCOUNTER — Ambulatory Visit (INDEPENDENT_AMBULATORY_CARE_PROVIDER_SITE_OTHER): Payer: Medicare Other | Admitting: Gastroenterology

## 2013-07-16 VITALS — BP 118/67 | HR 71 | Temp 98.4°F | Ht 63.5 in | Wt 134.2 lb

## 2013-07-16 DIAGNOSIS — A0472 Enterocolitis due to Clostridium difficile, not specified as recurrent: Secondary | ICD-10-CM

## 2013-07-16 DIAGNOSIS — K219 Gastro-esophageal reflux disease without esophagitis: Secondary | ICD-10-CM | POA: Diagnosis not present

## 2013-07-16 DIAGNOSIS — A0471 Enterocolitis due to Clostridium difficile, recurrent: Secondary | ICD-10-CM

## 2013-07-16 NOTE — Assessment & Plan Note (Signed)
History of recurrent C. difficile colitis. Seems to be doing well at this point. At this time fecal transplant would be the next course if she has recurrent C. Difficile.  Continue probiotics. I will discuss dental procedure with Dr. Jena Gauss and get his recommendations with regards to pursuing at this time. She would require at a minimal 1 dose of IV antibiotics perioperatively. If she develops recurrent diarrhea for 3 days, she has been given instructions to call Dr. Drue Second and Dr. Leone Payor to arrange for C. difficile testing. I will also discuss resuming PPI with Dr. Jena Gauss given refractory GERD on H2 blocker. I recommend the patient followup with her gynecologist or PCP regarding management of her anxiety.

## 2013-07-16 NOTE — Patient Instructions (Addendum)
1. You can resume medications as listed on your medication list as discussed. 2. I will discuss issues with dental procedure and pantoprazole with Dr. Jena Gauss and let you know his recommendations.

## 2013-07-16 NOTE — Progress Notes (Addendum)
Primary Care Physician: Cassell Smiles., MD  Primary Gastroenterologist:  Roetta Sessions, MD   Chief Complaint  Patient presents with  . Follow-up    HPI: Mary Hart is a 72 y.o. female here for followup of recurrent C. difficile colitis. She saw Dr. Drue Second with infectious diseases on 06/27/2013. At that time patient was on vancomycin with ongoing diarrhea. She was referred to Dr. Leone Payor regarding possibility of fecal transplant. By the time she saw Dr. Leone Payor September 9 she had been having normal bowel movements for approximately 3 days. As of right now the plan is to call if recurrent diarrhea three days in a row. If she has documented positive C. difficile test then they will perform fecal transplant. Her son plans to be the donor and is currently being evaluated.   At this time patient has been having normal bowel movements for at least two weeks. Has actually had some constipation. Her energy level is improving. She notes that she was unable to afford this Xifaxan chaser recommended by Dr. Drue Second. Dr. Feliz Beam office is aware that she never took it. Is taking Florastor along with [facility] as recommended by Dr. Leone Payor. She had lots of questions about her medication. She has been taking her Synthroid, acyclovir, Zantac, floor store, [employer] colon health only. She wanted our input regarding starting back on her medications that she stopped on her own.   Has some dental work that has to be done. Abx in the IV just the day of the procedure. Wants to know if it is safe to proceed at this point. Also waking up a lot of reflux and wants to resume her PPI as soon as possible. Also complains of increased stress related to taking care of her spouse. Does not like the way Xanax makes her feel and wants to consider other options.  Current Outpatient Prescriptions  Medication Sig Dispense Refill  . acyclovir (ZOVIRAX) 400 MG tablet Take 400 mg by mouth daily.       Marland Kitchen levothyroxine  (SYNTHROID, LEVOTHROID) 25 MCG tablet Take 25 mcg by mouth daily.        . Probiotic Product (PHILLIPS COLON HEALTH) CAPS Take 1 capsule by mouth daily.      . ranitidine (ZANTAC) 150 MG capsule Take 150 mg by mouth 2 (two) times daily.      Marland Kitchen saccharomyces boulardii (FLORASTOR) 250 MG capsule Take 250 mg by mouth daily.      Marland Kitchen ALPRAZolam (XANAX) 0.25 MG tablet Take 1 tablet by mouth 3 (three) times daily as needed.      .       .      .      .      .         No current facility-administered medications for this visit.    Allergies as of 07/16/2013 - Review Complete 07/16/2013  Allergen Reaction Noted  . Codeine    . Hydromorphone hcl    . Morphine    . Moxifloxacin    . Sulfonamide derivatives      ROS:  General: Negative for anorexia, weight loss, fever, chills, fatigue, weakness. ENT: Negative for hoarseness, difficulty swallowing , nasal congestion. CV: Negative for chest pain, angina, palpitations, dyspnea on exertion, peripheral edema.  Respiratory: Negative for dyspnea at rest, dyspnea on exertion, cough, sputum, wheezing.  GI: See history of present illness. GU:  Negative for dysuria, hematuria, urinary incontinence, urinary frequency, nocturnal urination.  Endo: Negative for unusual  weight change.    Physical Examination:   BP 118/67  Pulse 71  Temp(Src) 98.4 F (36.9 C) (Oral)  Ht 5' 3.5" (1.613 m)  Wt 134 lb 3.2 oz (60.873 kg)  BMI 23.4 kg/m2  General: Well-nourished, well-developed in no acute distress.  Eyes: No icterus. Mouth: Oropharyngeal mucosa moist and pink , no lesions erythema or exudate. Lungs: Clear to auscultation bilaterally.  Heart: Regular rate and rhythm, no murmurs rubs or gallops.  Abdomen: Bowel sounds are normal, nontender, nondistended, no hepatosplenomegaly or masses, no abdominal bruits or hernia , no rebound or guarding.   Extremities: No lower extremity edema. No clubbing or deformities. Neuro: Alert and oriented x 4   Skin: Warm  and dry, no jaundice.   Psych: Alert and cooperative, normal mood and affect.

## 2013-07-16 NOTE — Progress Notes (Signed)
CC'd to PCP 

## 2013-07-19 DIAGNOSIS — IMO0002 Reserved for concepts with insufficient information to code with codable children: Secondary | ICD-10-CM | POA: Diagnosis not present

## 2013-07-19 DIAGNOSIS — R5381 Other malaise: Secondary | ICD-10-CM | POA: Diagnosis not present

## 2013-07-19 DIAGNOSIS — Z23 Encounter for immunization: Secondary | ICD-10-CM | POA: Diagnosis not present

## 2013-07-24 NOTE — Progress Notes (Signed)
Please contact Dr. Drue Second, patient's ID doctor. Patient wants to have dental procedure done which require IV antibiotics at time of procedure. Does Dr. Drue Second have any specific concerns or recommendations given patient's h/o refractory/recurrent C.Diff.  Please let patient know delay in obtaining this advise. Dr. Jena Gauss wanted to get Dr. Feliz Beam input first.

## 2013-07-25 ENCOUNTER — Telehealth: Payer: Self-pay

## 2013-07-25 NOTE — Progress Notes (Signed)
Sent request to Dr. Drue Second.

## 2013-07-25 NOTE — Telephone Encounter (Signed)
Dr. Drue Second,  We recently saw this pt in our office, she is needing some dental procedures which are going to require IV antibiotics. We need to know if you have any concerns or recommendations for her given her history of cdiff?   Please advise. Thank you,  Tana Coast PA-C jl

## 2013-07-26 ENCOUNTER — Ambulatory Visit: Payer: Medicare Other | Admitting: Internal Medicine

## 2013-07-26 NOTE — Telephone Encounter (Signed)
Hi julie, if she needs IV antibiotics, I would probably concurrently restart her on vancomycin 125mg  PO BID (it can be pills or liquid) then we can see her in our clinic

## 2013-07-26 NOTE — Telephone Encounter (Signed)
Dr. Victory Dakin you for your recommendations. We will let her know.

## 2013-07-27 NOTE — Telephone Encounter (Signed)
Please let patient know response from Dr. Drue Second. Patient will need to let us know when she has her dental procedure scheduled so this can be coordinated.

## 2013-07-27 NOTE — Telephone Encounter (Signed)
Pt is aware and she has not set up procedure yet. She will set it up and call us back and let us know when it is.

## 2013-08-02 DIAGNOSIS — J310 Chronic rhinitis: Secondary | ICD-10-CM | POA: Diagnosis not present

## 2013-08-02 DIAGNOSIS — J019 Acute sinusitis, unspecified: Secondary | ICD-10-CM | POA: Diagnosis not present

## 2013-08-02 DIAGNOSIS — IMO0002 Reserved for concepts with insufficient information to code with codable children: Secondary | ICD-10-CM | POA: Diagnosis not present

## 2013-08-15 DIAGNOSIS — IMO0002 Reserved for concepts with insufficient information to code with codable children: Secondary | ICD-10-CM | POA: Diagnosis not present

## 2013-08-15 DIAGNOSIS — J069 Acute upper respiratory infection, unspecified: Secondary | ICD-10-CM | POA: Diagnosis not present

## 2013-08-15 DIAGNOSIS — B9789 Other viral agents as the cause of diseases classified elsewhere: Secondary | ICD-10-CM | POA: Diagnosis not present

## 2013-08-27 ENCOUNTER — Telehealth: Payer: Self-pay

## 2013-08-27 NOTE — Telephone Encounter (Signed)
Would advise against taking antibiotics unless PCP really suspects not viral. Any antibiotic increases her risk of C.Diff given her history.  I would recommend following Dr. Feliz Beam advice from earlier this month. If she requires antibiotics consider restarting her vancomycin 125mg  PO BID then see Dr. Drue Second in the clinic.

## 2013-08-27 NOTE — Telephone Encounter (Signed)
Pt has a head cold and is going to her PCP but she would like to know which antibiotics she could take with her history of C-Diff. Please advise

## 2013-08-28 ENCOUNTER — Other Ambulatory Visit (HOSPITAL_COMMUNITY): Payer: Self-pay | Admitting: Family Medicine

## 2013-08-28 ENCOUNTER — Ambulatory Visit (HOSPITAL_COMMUNITY)
Admission: RE | Admit: 2013-08-28 | Discharge: 2013-08-28 | Disposition: A | Discharge status: Home / Self Care | Payer: Medicare Other | Source: Ambulatory Visit | Attending: Family Medicine | Admitting: Family Medicine

## 2013-08-28 DIAGNOSIS — IMO0002 Reserved for concepts with insufficient information to code with codable children: Secondary | ICD-10-CM | POA: Diagnosis not present

## 2013-08-28 DIAGNOSIS — R0989 Other specified symptoms and signs involving the circulatory and respiratory systems: Secondary | ICD-10-CM | POA: Insufficient documentation

## 2013-08-28 DIAGNOSIS — F17200 Nicotine dependence, unspecified, uncomplicated: Secondary | ICD-10-CM | POA: Diagnosis not present

## 2013-08-28 DIAGNOSIS — N9489 Other specified conditions associated with female genital organs and menstrual cycle: Secondary | ICD-10-CM

## 2013-08-28 DIAGNOSIS — R0789 Other chest pain: Secondary | ICD-10-CM | POA: Diagnosis not present

## 2013-08-28 DIAGNOSIS — R059 Cough, unspecified: Secondary | ICD-10-CM | POA: Diagnosis not present

## 2013-08-28 DIAGNOSIS — R05 Cough: Secondary | ICD-10-CM | POA: Insufficient documentation

## 2013-08-28 DIAGNOSIS — J069 Acute upper respiratory infection, unspecified: Secondary | ICD-10-CM | POA: Diagnosis not present

## 2013-08-28 NOTE — Telephone Encounter (Signed)
Pt is aware of what LSL said 

## 2013-09-11 DIAGNOSIS — Z124 Encounter for screening for malignant neoplasm of cervix: Secondary | ICD-10-CM | POA: Diagnosis not present

## 2013-09-24 DIAGNOSIS — M899 Disorder of bone, unspecified: Secondary | ICD-10-CM | POA: Diagnosis not present

## 2013-10-01 ENCOUNTER — Other Ambulatory Visit: Payer: Self-pay | Admitting: Gastroenterology

## 2013-10-01 NOTE — Telephone Encounter (Signed)
Please let patient know if she restarts/restarted her pantoprazole, I would suggest taking only once per day if tolerated given her h/o recurrent/refractory C.Diff.  RX for once daily sent.

## 2013-10-03 ENCOUNTER — Other Ambulatory Visit (HOSPITAL_COMMUNITY): Payer: Self-pay | Admitting: *Deleted

## 2013-10-04 ENCOUNTER — Encounter (HOSPITAL_COMMUNITY)
Admission: RE | Admit: 2013-10-04 | Discharge: 2013-10-04 | Disposition: A | Discharge status: Home / Self Care | Payer: Medicare Other | Source: Ambulatory Visit | Attending: Obstetrics and Gynecology | Admitting: Obstetrics and Gynecology

## 2013-10-04 DIAGNOSIS — M810 Age-related osteoporosis without current pathological fracture: Secondary | ICD-10-CM | POA: Insufficient documentation

## 2013-10-04 MED ORDER — ZOLEDRONIC ACID 5 MG/100ML IV SOLN
INTRAVENOUS | Status: AC
  Filled 2013-10-04: qty 100

## 2013-10-04 MED ORDER — ZOLEDRONIC ACID 5 MG/100ML IV SOLN
5.0000 mg | Freq: Once | INTRAVENOUS | Status: AC
  Administered 2013-10-04: 5 mg via INTRAVENOUS

## 2013-10-12 ENCOUNTER — Telehealth: Payer: Self-pay | Admitting: Internal Medicine

## 2013-10-12 MED ORDER — VANCOMYCIN HCL 125 MG PO CAPS: 125.0000 mg | ORAL_CAPSULE | Freq: Two times a day (BID) | ORAL | Status: DC

## 2013-10-12 NOTE — Telephone Encounter (Signed)
Please let patient know that I sent RX to [facility] for vancomycin.  She needs to let Dr. Drue Second know of the dental procedure date because she had suggested started the vancomycin the day she gets the IV clindamycin and then follow with her while on vancomycin for further instructions.

## 2013-10-12 NOTE — Addendum Note (Signed)
Addended by: Tiffany Kocher on: 10/12/2013 12:22 PM   Modules accepted: Orders

## 2013-10-12 NOTE — Telephone Encounter (Signed)
Spoke with pt- informed her that rx was sent to [place] from the pharmacy. She said she didn't ask them to send it so she didn't pick it up.   Pt is having her dental procedure on 10/23/13. She stated she is getting Clindamycin IV. She said if we and Dr. Drue Second absolutely felt like she had to have the vanc, she would get it and take it. I went over Dr. Feliz Beam recommendations with her again. Pt verbalized understanding.  She will need and rx for vanc sent to [facility]

## 2013-10-12 NOTE — Telephone Encounter (Signed)
Pt called this morning to let us know that [facility] called her yesterday to let her know that she had a rx of Protonix there for her to pick up. She asked the pharmacist who called it in and they told her [facility] did on 10/01/13. Pt said that she hadn't been here since September and told the pharmacy to put it back and also that she switched pharmacys and uses [facility] in [place]. She also would like for JL or LSL to call her back regarding a surgery she is scheduled for on 12/30 and has questions about some antibiotics. She asked for someone to call her cell number. [phone]

## 2013-10-15 NOTE — Telephone Encounter (Signed)
Pt is aware.  

## 2013-10-16 DIAGNOSIS — M9981 Other biomechanical lesions of cervical region: Secondary | ICD-10-CM | POA: Diagnosis not present

## 2013-10-16 DIAGNOSIS — M531 Cervicobrachial syndrome: Secondary | ICD-10-CM | POA: Diagnosis not present

## 2013-10-16 DIAGNOSIS — M4712 Other spondylosis with myelopathy, cervical region: Secondary | ICD-10-CM | POA: Diagnosis not present

## 2013-10-26 NOTE — Progress Notes (Signed)
REVIEWED.  

## 2013-11-13 DIAGNOSIS — M4712 Other spondylosis with myelopathy, cervical region: Secondary | ICD-10-CM | POA: Diagnosis not present

## 2013-11-13 DIAGNOSIS — M9981 Other biomechanical lesions of cervical region: Secondary | ICD-10-CM | POA: Diagnosis not present

## 2013-11-13 DIAGNOSIS — M47814 Spondylosis without myelopathy or radiculopathy, thoracic region: Secondary | ICD-10-CM | POA: Diagnosis not present

## 2013-11-13 DIAGNOSIS — M531 Cervicobrachial syndrome: Secondary | ICD-10-CM | POA: Diagnosis not present

## 2013-11-13 DIAGNOSIS — M47817 Spondylosis without myelopathy or radiculopathy, lumbosacral region: Secondary | ICD-10-CM | POA: Diagnosis not present

## 2013-11-13 DIAGNOSIS — M5030 Other cervical disc degeneration, unspecified cervical region: Secondary | ICD-10-CM | POA: Diagnosis not present

## 2013-11-13 DIAGNOSIS — M999 Biomechanical lesion, unspecified: Secondary | ICD-10-CM | POA: Diagnosis not present

## 2013-11-15 DIAGNOSIS — M4712 Other spondylosis with myelopathy, cervical region: Secondary | ICD-10-CM | POA: Diagnosis not present

## 2013-11-15 DIAGNOSIS — M9981 Other biomechanical lesions of cervical region: Secondary | ICD-10-CM | POA: Diagnosis not present

## 2013-11-15 DIAGNOSIS — R51 Headache: Secondary | ICD-10-CM | POA: Diagnosis not present

## 2013-11-15 DIAGNOSIS — M531 Cervicobrachial syndrome: Secondary | ICD-10-CM | POA: Diagnosis not present

## 2013-11-15 DIAGNOSIS — J019 Acute sinusitis, unspecified: Secondary | ICD-10-CM | POA: Diagnosis not present

## 2013-11-15 DIAGNOSIS — F411 Generalized anxiety disorder: Secondary | ICD-10-CM | POA: Diagnosis not present

## 2013-11-15 DIAGNOSIS — IMO0002 Reserved for concepts with insufficient information to code with codable children: Secondary | ICD-10-CM | POA: Diagnosis not present

## 2013-11-19 DIAGNOSIS — M999 Biomechanical lesion, unspecified: Secondary | ICD-10-CM | POA: Diagnosis not present

## 2013-11-19 DIAGNOSIS — M5030 Other cervical disc degeneration, unspecified cervical region: Secondary | ICD-10-CM | POA: Diagnosis not present

## 2013-11-19 DIAGNOSIS — M9981 Other biomechanical lesions of cervical region: Secondary | ICD-10-CM | POA: Diagnosis not present

## 2013-11-19 DIAGNOSIS — M47817 Spondylosis without myelopathy or radiculopathy, lumbosacral region: Secondary | ICD-10-CM | POA: Diagnosis not present

## 2013-11-19 DIAGNOSIS — M531 Cervicobrachial syndrome: Secondary | ICD-10-CM | POA: Diagnosis not present

## 2013-11-19 DIAGNOSIS — M47814 Spondylosis without myelopathy or radiculopathy, thoracic region: Secondary | ICD-10-CM | POA: Diagnosis not present

## 2013-11-19 DIAGNOSIS — M4712 Other spondylosis with myelopathy, cervical region: Secondary | ICD-10-CM | POA: Diagnosis not present

## 2013-11-21 DIAGNOSIS — M9981 Other biomechanical lesions of cervical region: Secondary | ICD-10-CM | POA: Diagnosis not present

## 2013-11-21 DIAGNOSIS — M4712 Other spondylosis with myelopathy, cervical region: Secondary | ICD-10-CM | POA: Diagnosis not present

## 2013-11-21 DIAGNOSIS — M531 Cervicobrachial syndrome: Secondary | ICD-10-CM | POA: Diagnosis not present

## 2013-11-23 DIAGNOSIS — M9981 Other biomechanical lesions of cervical region: Secondary | ICD-10-CM | POA: Diagnosis not present

## 2013-11-23 DIAGNOSIS — M531 Cervicobrachial syndrome: Secondary | ICD-10-CM | POA: Diagnosis not present

## 2013-11-23 DIAGNOSIS — M47814 Spondylosis without myelopathy or radiculopathy, thoracic region: Secondary | ICD-10-CM | POA: Diagnosis not present

## 2013-11-23 DIAGNOSIS — M47817 Spondylosis without myelopathy or radiculopathy, lumbosacral region: Secondary | ICD-10-CM | POA: Diagnosis not present

## 2013-11-23 DIAGNOSIS — M4712 Other spondylosis with myelopathy, cervical region: Secondary | ICD-10-CM | POA: Diagnosis not present

## 2013-11-23 DIAGNOSIS — M5030 Other cervical disc degeneration, unspecified cervical region: Secondary | ICD-10-CM | POA: Diagnosis not present

## 2013-11-23 DIAGNOSIS — M999 Biomechanical lesion, unspecified: Secondary | ICD-10-CM | POA: Diagnosis not present

## 2013-11-25 IMAGING — CR DG CHEST 2V
2 series · 2 of 2 positions shown · non-contrast
Comparison: 08/09/2012

CLINICAL DATA: Productive cough, congestion, left side chest
pressure into upper left chest, smoker

EXAM:
CHEST  2 VIEW

[view not recorded (1 of 2)]
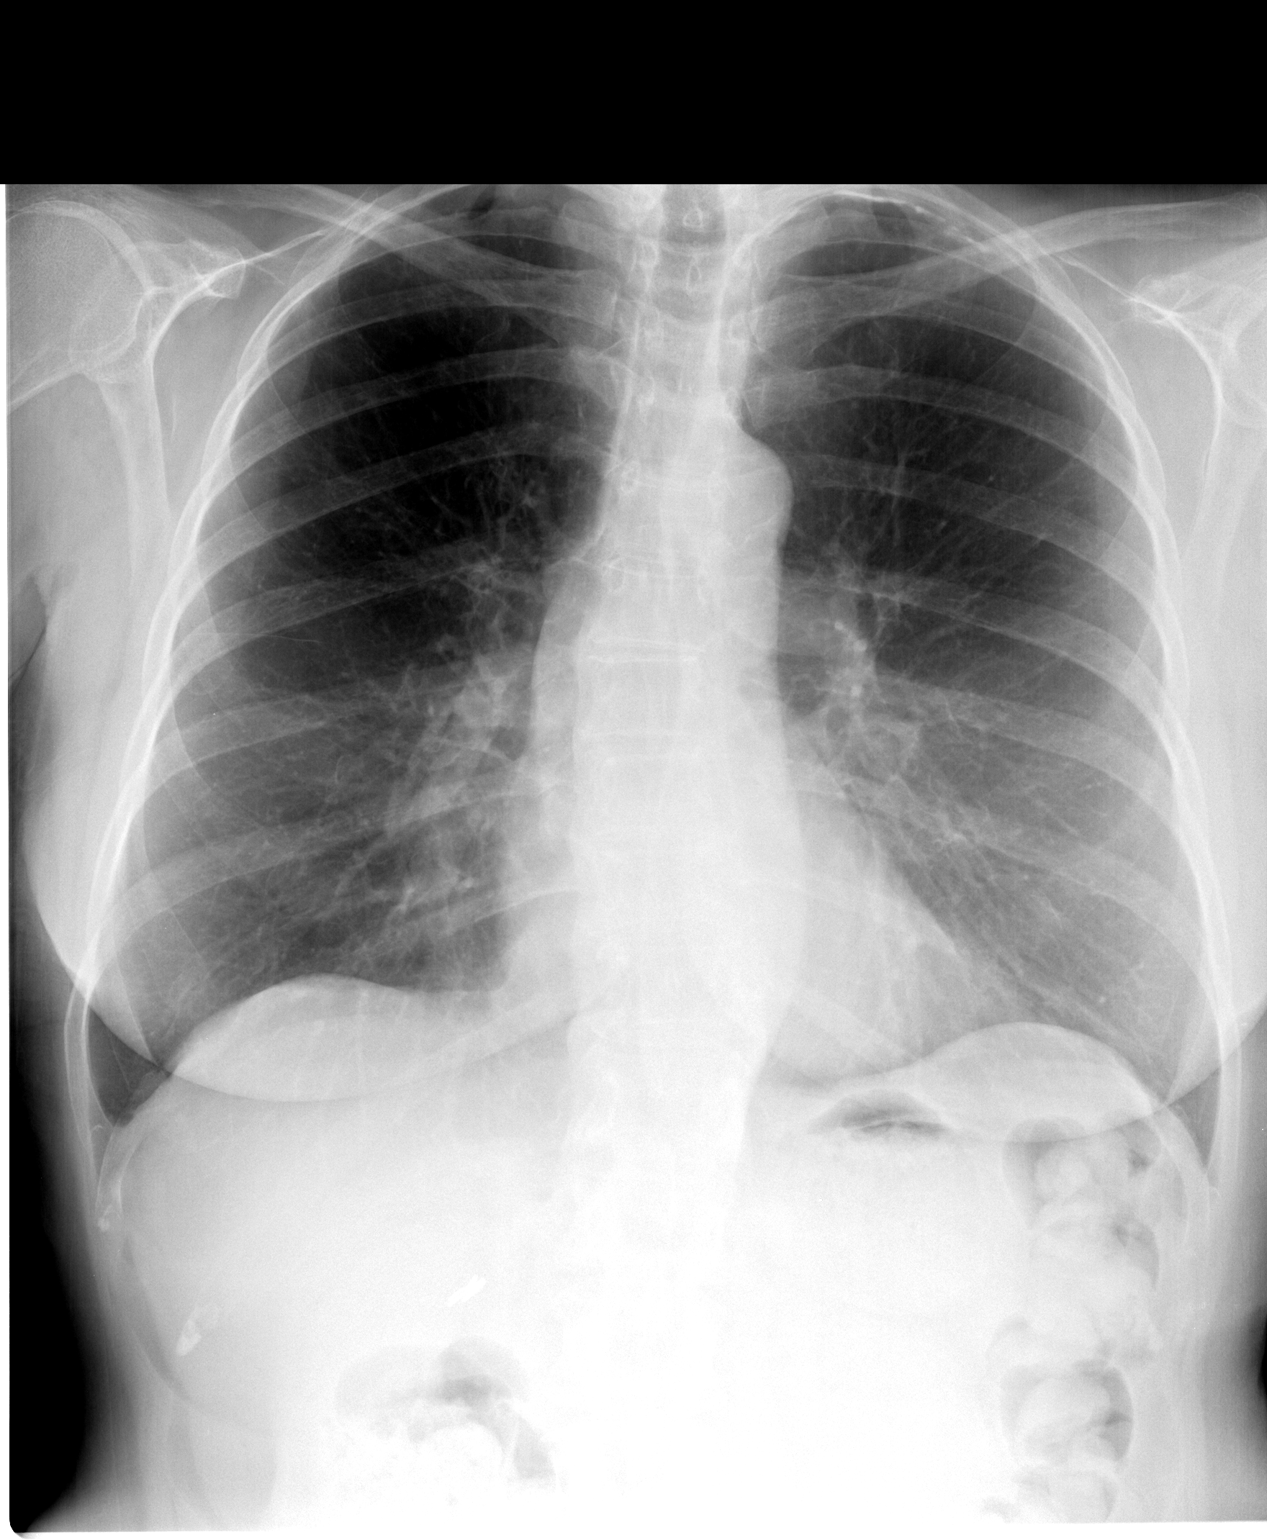

[view not recorded (2 of 2)]
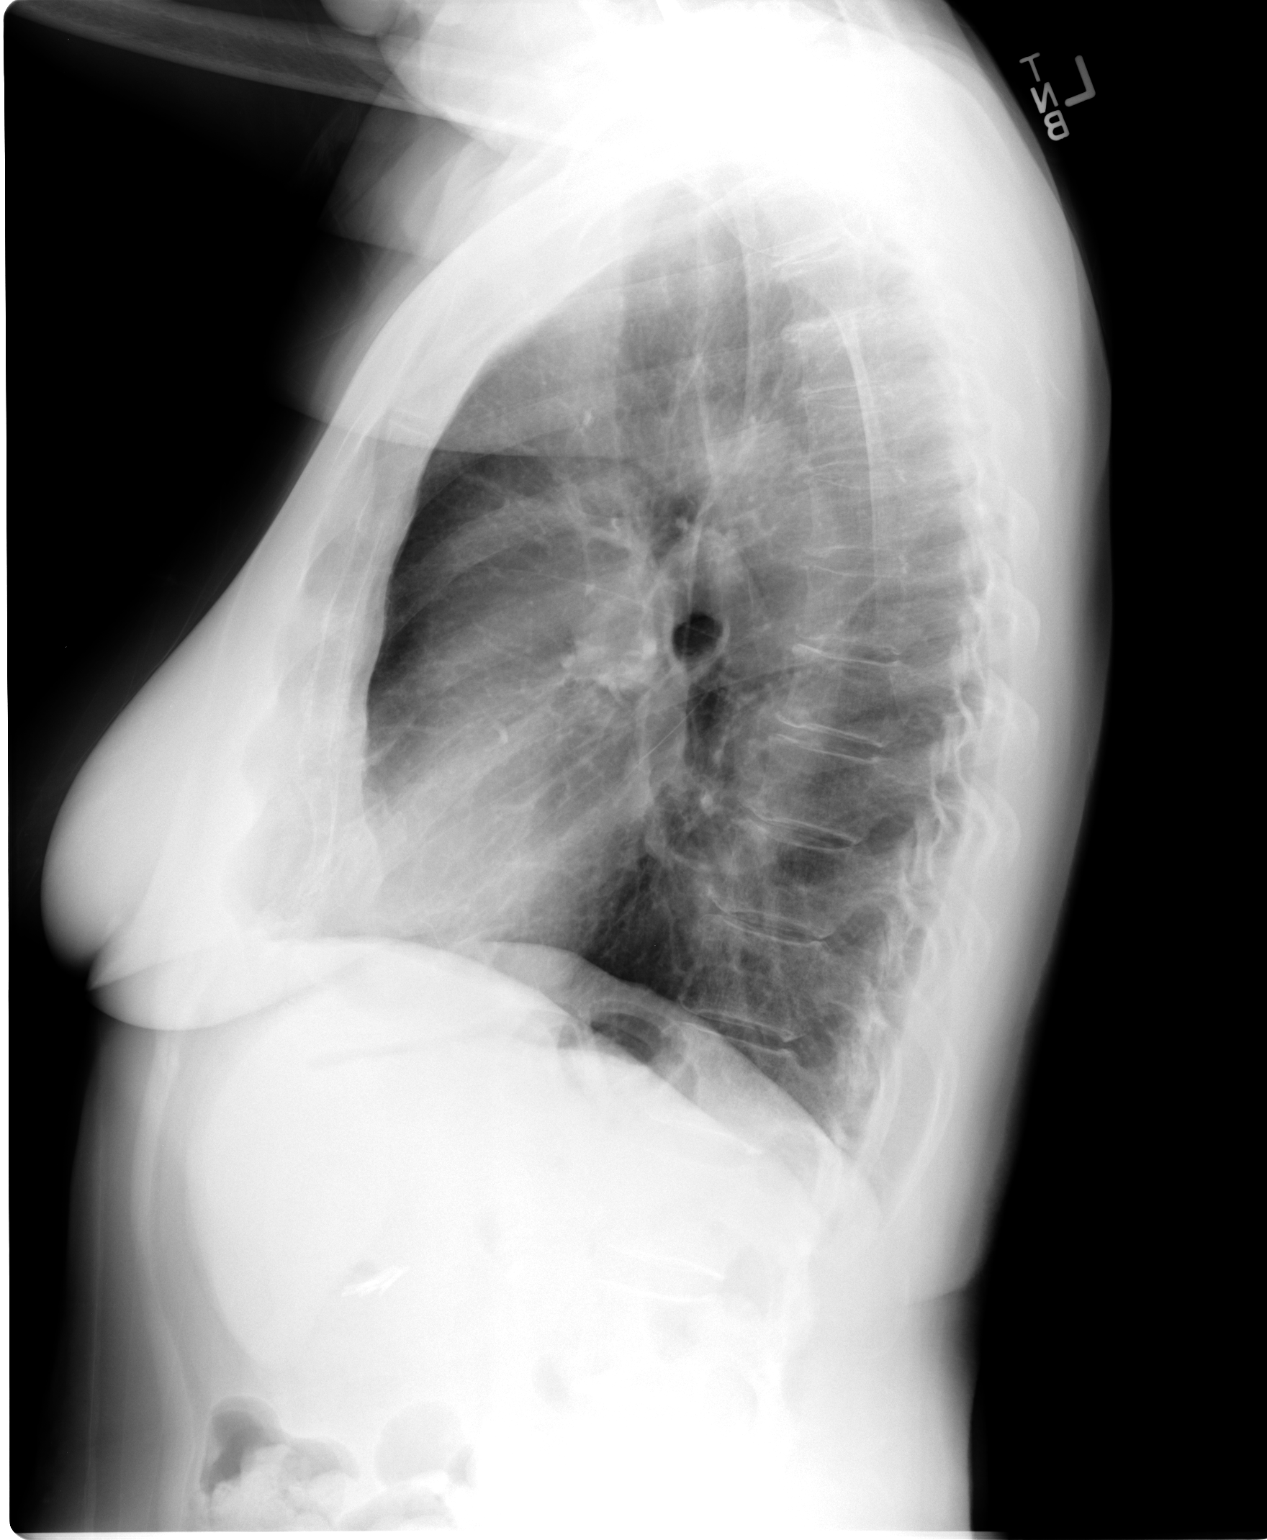

[2 of 2 positions shown; findings below may reference images not displayed]

FINDINGS: Normal heart size and pulmonary vascularity.

Minimally tortuous thoracic aorta.

Lungs slightly hyperinflated but clear.

No pleural effusion or pneumothorax.

Prior cervical spine fusion.

Right apical pleural calcification stable.

Surgical clips right upper quadrant question cholecystectomy.
IMPRESSION: No acute abnormalities.

## 2013-11-27 DIAGNOSIS — M5030 Other cervical disc degeneration, unspecified cervical region: Secondary | ICD-10-CM | POA: Diagnosis not present

## 2013-11-27 DIAGNOSIS — M9981 Other biomechanical lesions of cervical region: Secondary | ICD-10-CM | POA: Diagnosis not present

## 2013-11-27 DIAGNOSIS — M47817 Spondylosis without myelopathy or radiculopathy, lumbosacral region: Secondary | ICD-10-CM | POA: Diagnosis not present

## 2013-11-27 DIAGNOSIS — M999 Biomechanical lesion, unspecified: Secondary | ICD-10-CM | POA: Diagnosis not present

## 2013-11-27 DIAGNOSIS — M531 Cervicobrachial syndrome: Secondary | ICD-10-CM | POA: Diagnosis not present

## 2013-11-27 DIAGNOSIS — M4712 Other spondylosis with myelopathy, cervical region: Secondary | ICD-10-CM | POA: Diagnosis not present

## 2013-11-27 DIAGNOSIS — M47814 Spondylosis without myelopathy or radiculopathy, thoracic region: Secondary | ICD-10-CM | POA: Diagnosis not present

## 2013-11-30 DIAGNOSIS — M999 Biomechanical lesion, unspecified: Secondary | ICD-10-CM | POA: Diagnosis not present

## 2013-11-30 DIAGNOSIS — M531 Cervicobrachial syndrome: Secondary | ICD-10-CM | POA: Diagnosis not present

## 2013-11-30 DIAGNOSIS — M5030 Other cervical disc degeneration, unspecified cervical region: Secondary | ICD-10-CM | POA: Diagnosis not present

## 2013-11-30 DIAGNOSIS — M47814 Spondylosis without myelopathy or radiculopathy, thoracic region: Secondary | ICD-10-CM | POA: Diagnosis not present

## 2013-11-30 DIAGNOSIS — M9981 Other biomechanical lesions of cervical region: Secondary | ICD-10-CM | POA: Diagnosis not present

## 2013-11-30 DIAGNOSIS — M47817 Spondylosis without myelopathy or radiculopathy, lumbosacral region: Secondary | ICD-10-CM | POA: Diagnosis not present

## 2013-11-30 DIAGNOSIS — M4712 Other spondylosis with myelopathy, cervical region: Secondary | ICD-10-CM | POA: Diagnosis not present

## 2013-12-04 DIAGNOSIS — M531 Cervicobrachial syndrome: Secondary | ICD-10-CM | POA: Diagnosis not present

## 2013-12-04 DIAGNOSIS — M47817 Spondylosis without myelopathy or radiculopathy, lumbosacral region: Secondary | ICD-10-CM | POA: Diagnosis not present

## 2013-12-04 DIAGNOSIS — M47814 Spondylosis without myelopathy or radiculopathy, thoracic region: Secondary | ICD-10-CM | POA: Diagnosis not present

## 2013-12-04 DIAGNOSIS — M5030 Other cervical disc degeneration, unspecified cervical region: Secondary | ICD-10-CM | POA: Diagnosis not present

## 2013-12-04 DIAGNOSIS — M999 Biomechanical lesion, unspecified: Secondary | ICD-10-CM | POA: Diagnosis not present

## 2013-12-04 DIAGNOSIS — M4712 Other spondylosis with myelopathy, cervical region: Secondary | ICD-10-CM | POA: Diagnosis not present

## 2013-12-04 DIAGNOSIS — M9981 Other biomechanical lesions of cervical region: Secondary | ICD-10-CM | POA: Diagnosis not present

## 2013-12-06 DIAGNOSIS — H5210 Myopia, unspecified eye: Secondary | ICD-10-CM | POA: Diagnosis not present

## 2013-12-06 DIAGNOSIS — Z961 Presence of intraocular lens: Secondary | ICD-10-CM | POA: Diagnosis not present

## 2013-12-06 DIAGNOSIS — H43819 Vitreous degeneration, unspecified eye: Secondary | ICD-10-CM | POA: Diagnosis not present

## 2013-12-06 DIAGNOSIS — H43319 Vitreous membranes and strands, unspecified eye: Secondary | ICD-10-CM | POA: Diagnosis not present

## 2013-12-07 DIAGNOSIS — M531 Cervicobrachial syndrome: Secondary | ICD-10-CM | POA: Diagnosis not present

## 2013-12-07 DIAGNOSIS — M9981 Other biomechanical lesions of cervical region: Secondary | ICD-10-CM | POA: Diagnosis not present

## 2013-12-07 DIAGNOSIS — M4712 Other spondylosis with myelopathy, cervical region: Secondary | ICD-10-CM | POA: Diagnosis not present

## 2013-12-19 DIAGNOSIS — M531 Cervicobrachial syndrome: Secondary | ICD-10-CM | POA: Diagnosis not present

## 2013-12-19 DIAGNOSIS — M9981 Other biomechanical lesions of cervical region: Secondary | ICD-10-CM | POA: Diagnosis not present

## 2013-12-19 DIAGNOSIS — M4712 Other spondylosis with myelopathy, cervical region: Secondary | ICD-10-CM | POA: Diagnosis not present

## 2013-12-24 DIAGNOSIS — M47817 Spondylosis without myelopathy or radiculopathy, lumbosacral region: Secondary | ICD-10-CM | POA: Diagnosis not present

## 2013-12-24 DIAGNOSIS — M47814 Spondylosis without myelopathy or radiculopathy, thoracic region: Secondary | ICD-10-CM | POA: Diagnosis not present

## 2013-12-24 DIAGNOSIS — M531 Cervicobrachial syndrome: Secondary | ICD-10-CM | POA: Diagnosis not present

## 2013-12-24 DIAGNOSIS — M5030 Other cervical disc degeneration, unspecified cervical region: Secondary | ICD-10-CM | POA: Diagnosis not present

## 2013-12-24 DIAGNOSIS — M4712 Other spondylosis with myelopathy, cervical region: Secondary | ICD-10-CM | POA: Diagnosis not present

## 2013-12-24 DIAGNOSIS — M999 Biomechanical lesion, unspecified: Secondary | ICD-10-CM | POA: Diagnosis not present

## 2013-12-24 DIAGNOSIS — M9981 Other biomechanical lesions of cervical region: Secondary | ICD-10-CM | POA: Diagnosis not present

## 2013-12-27 DIAGNOSIS — M999 Biomechanical lesion, unspecified: Secondary | ICD-10-CM | POA: Diagnosis not present

## 2013-12-27 DIAGNOSIS — M531 Cervicobrachial syndrome: Secondary | ICD-10-CM | POA: Diagnosis not present

## 2013-12-27 DIAGNOSIS — M47817 Spondylosis without myelopathy or radiculopathy, lumbosacral region: Secondary | ICD-10-CM | POA: Diagnosis not present

## 2013-12-27 DIAGNOSIS — M5030 Other cervical disc degeneration, unspecified cervical region: Secondary | ICD-10-CM | POA: Diagnosis not present

## 2013-12-27 DIAGNOSIS — M47814 Spondylosis without myelopathy or radiculopathy, thoracic region: Secondary | ICD-10-CM | POA: Diagnosis not present

## 2013-12-27 DIAGNOSIS — M4712 Other spondylosis with myelopathy, cervical region: Secondary | ICD-10-CM | POA: Diagnosis not present

## 2013-12-27 DIAGNOSIS — M9981 Other biomechanical lesions of cervical region: Secondary | ICD-10-CM | POA: Diagnosis not present

## 2014-01-01 DIAGNOSIS — M531 Cervicobrachial syndrome: Secondary | ICD-10-CM | POA: Diagnosis not present

## 2014-01-01 DIAGNOSIS — M4712 Other spondylosis with myelopathy, cervical region: Secondary | ICD-10-CM | POA: Diagnosis not present

## 2014-01-01 DIAGNOSIS — M999 Biomechanical lesion, unspecified: Secondary | ICD-10-CM | POA: Diagnosis not present

## 2014-01-01 DIAGNOSIS — M9981 Other biomechanical lesions of cervical region: Secondary | ICD-10-CM | POA: Diagnosis not present

## 2014-01-01 DIAGNOSIS — M47814 Spondylosis without myelopathy or radiculopathy, thoracic region: Secondary | ICD-10-CM | POA: Diagnosis not present

## 2014-01-01 DIAGNOSIS — M5030 Other cervical disc degeneration, unspecified cervical region: Secondary | ICD-10-CM | POA: Diagnosis not present

## 2014-01-01 DIAGNOSIS — M47817 Spondylosis without myelopathy or radiculopathy, lumbosacral region: Secondary | ICD-10-CM | POA: Diagnosis not present

## 2014-01-08 DIAGNOSIS — M47817 Spondylosis without myelopathy or radiculopathy, lumbosacral region: Secondary | ICD-10-CM | POA: Diagnosis not present

## 2014-01-08 DIAGNOSIS — M47814 Spondylosis without myelopathy or radiculopathy, thoracic region: Secondary | ICD-10-CM | POA: Diagnosis not present

## 2014-01-08 DIAGNOSIS — M999 Biomechanical lesion, unspecified: Secondary | ICD-10-CM | POA: Diagnosis not present

## 2014-01-08 DIAGNOSIS — M531 Cervicobrachial syndrome: Secondary | ICD-10-CM | POA: Diagnosis not present

## 2014-01-08 DIAGNOSIS — M5030 Other cervical disc degeneration, unspecified cervical region: Secondary | ICD-10-CM | POA: Diagnosis not present

## 2014-01-08 DIAGNOSIS — M9981 Other biomechanical lesions of cervical region: Secondary | ICD-10-CM | POA: Diagnosis not present

## 2014-01-08 DIAGNOSIS — M4712 Other spondylosis with myelopathy, cervical region: Secondary | ICD-10-CM | POA: Diagnosis not present

## 2014-01-10 DIAGNOSIS — S83289A Other tear of lateral meniscus, current injury, unspecified knee, initial encounter: Secondary | ICD-10-CM | POA: Diagnosis not present

## 2014-01-10 DIAGNOSIS — IMO0002 Reserved for concepts with insufficient information to code with codable children: Secondary | ICD-10-CM | POA: Diagnosis not present

## 2014-01-15 DIAGNOSIS — M9981 Other biomechanical lesions of cervical region: Secondary | ICD-10-CM | POA: Diagnosis not present

## 2014-01-15 DIAGNOSIS — M531 Cervicobrachial syndrome: Secondary | ICD-10-CM | POA: Diagnosis not present

## 2014-01-15 DIAGNOSIS — M47817 Spondylosis without myelopathy or radiculopathy, lumbosacral region: Secondary | ICD-10-CM | POA: Diagnosis not present

## 2014-01-15 DIAGNOSIS — M4712 Other spondylosis with myelopathy, cervical region: Secondary | ICD-10-CM | POA: Diagnosis not present

## 2014-01-15 DIAGNOSIS — M5030 Other cervical disc degeneration, unspecified cervical region: Secondary | ICD-10-CM | POA: Diagnosis not present

## 2014-01-15 DIAGNOSIS — M999 Biomechanical lesion, unspecified: Secondary | ICD-10-CM | POA: Diagnosis not present

## 2014-01-15 DIAGNOSIS — M47814 Spondylosis without myelopathy or radiculopathy, thoracic region: Secondary | ICD-10-CM | POA: Diagnosis not present

## 2014-01-22 DIAGNOSIS — M531 Cervicobrachial syndrome: Secondary | ICD-10-CM | POA: Diagnosis not present

## 2014-01-22 DIAGNOSIS — M4712 Other spondylosis with myelopathy, cervical region: Secondary | ICD-10-CM | POA: Diagnosis not present

## 2014-01-22 DIAGNOSIS — M9981 Other biomechanical lesions of cervical region: Secondary | ICD-10-CM | POA: Diagnosis not present

## 2014-01-22 DIAGNOSIS — M5030 Other cervical disc degeneration, unspecified cervical region: Secondary | ICD-10-CM | POA: Diagnosis not present

## 2014-01-22 DIAGNOSIS — M999 Biomechanical lesion, unspecified: Secondary | ICD-10-CM | POA: Diagnosis not present

## 2014-01-22 DIAGNOSIS — M47817 Spondylosis without myelopathy or radiculopathy, lumbosacral region: Secondary | ICD-10-CM | POA: Diagnosis not present

## 2014-01-22 DIAGNOSIS — M47814 Spondylosis without myelopathy or radiculopathy, thoracic region: Secondary | ICD-10-CM | POA: Diagnosis not present

## 2014-01-23 DIAGNOSIS — Y999 Unspecified external cause status: Secondary | ICD-10-CM | POA: Diagnosis not present

## 2014-01-23 DIAGNOSIS — S83289A Other tear of lateral meniscus, current injury, unspecified knee, initial encounter: Secondary | ICD-10-CM | POA: Diagnosis not present

## 2014-01-23 DIAGNOSIS — M9420 Chondromalacia, unspecified site: Secondary | ICD-10-CM | POA: Diagnosis not present

## 2014-01-23 DIAGNOSIS — X58XXXA Exposure to other specified factors, initial encounter: Secondary | ICD-10-CM | POA: Diagnosis not present

## 2014-01-23 DIAGNOSIS — G8918 Other acute postprocedural pain: Secondary | ICD-10-CM | POA: Diagnosis not present

## 2014-01-23 DIAGNOSIS — M2240 Chondromalacia patellae, unspecified knee: Secondary | ICD-10-CM | POA: Diagnosis not present

## 2014-01-23 DIAGNOSIS — M2390 Unspecified internal derangement of unspecified knee: Secondary | ICD-10-CM | POA: Diagnosis not present

## 2014-01-23 DIAGNOSIS — IMO0002 Reserved for concepts with insufficient information to code with codable children: Secondary | ICD-10-CM | POA: Diagnosis not present

## 2014-01-23 DIAGNOSIS — Y929 Unspecified place or not applicable: Secondary | ICD-10-CM | POA: Diagnosis not present

## 2014-01-31 ENCOUNTER — Telehealth: Payer: Self-pay

## 2014-01-31 NOTE — Telephone Encounter (Signed)
Yes. Send for C.Diff PCR,

## 2014-01-31 NOTE — Telephone Encounter (Signed)
Pt is having diarrhea since Wednesday. She has a sample and would like to know we could send an order for C-diff to the lab for her. Please advise

## 2014-02-01 ENCOUNTER — Other Ambulatory Visit: Payer: Self-pay

## 2014-02-01 DIAGNOSIS — A0471 Enterocolitis due to Clostridium difficile, recurrent: Secondary | ICD-10-CM

## 2014-02-01 NOTE — Telephone Encounter (Signed)
Pt is aware to take sample to the lab

## 2014-02-01 NOTE — Telephone Encounter (Signed)
Orders in and faxed to lab.

## 2014-02-04 ENCOUNTER — Telehealth: Payer: Self-pay | Admitting: General Practice

## 2014-02-04 DIAGNOSIS — A0471 Enterocolitis due to Clostridium difficile, recurrent: Secondary | ICD-10-CM

## 2014-02-04 LAB — CLOSTRIDIUM DIFFICILE BY PCR: Toxigenic C. Difficile by PCR: NOT DETECTED

## 2014-02-04 NOTE — Telephone Encounter (Signed)
Patient called and wanted to know about her c-diff result.  Per Lucila Maine the patient's results were negative. Pt informed of her results.    She would like Magda Paganini to advise her on how to relieve her bloating and diarrhea.  Routing to [employer]

## 2014-02-04 NOTE — Telephone Encounter (Signed)
agree

## 2014-02-05 NOTE — Telephone Encounter (Addendum)
Last seen 06/2013.  Please schedule OV with [facility] only.  In meantime, make sure she is on probiotic. Avoid dairy right now.   Given her history of refractory Cdiff and knowledge of potential false negative results, we should have her repeat another C.Diff PCR.  How many stools daily is she having?

## 2014-02-06 DIAGNOSIS — M47814 Spondylosis without myelopathy or radiculopathy, thoracic region: Secondary | ICD-10-CM | POA: Diagnosis not present

## 2014-02-06 DIAGNOSIS — M47817 Spondylosis without myelopathy or radiculopathy, lumbosacral region: Secondary | ICD-10-CM | POA: Diagnosis not present

## 2014-02-06 DIAGNOSIS — M531 Cervicobrachial syndrome: Secondary | ICD-10-CM | POA: Diagnosis not present

## 2014-02-06 DIAGNOSIS — M999 Biomechanical lesion, unspecified: Secondary | ICD-10-CM | POA: Diagnosis not present

## 2014-02-06 DIAGNOSIS — M5030 Other cervical disc degeneration, unspecified cervical region: Secondary | ICD-10-CM | POA: Diagnosis not present

## 2014-02-06 DIAGNOSIS — M4712 Other spondylosis with myelopathy, cervical region: Secondary | ICD-10-CM | POA: Diagnosis not present

## 2014-02-06 DIAGNOSIS — M9981 Other biomechanical lesions of cervical region: Secondary | ICD-10-CM | POA: Diagnosis not present

## 2014-02-07 ENCOUNTER — Other Ambulatory Visit: Payer: Self-pay

## 2014-02-07 DIAGNOSIS — A0471 Enterocolitis due to Clostridium difficile, recurrent: Secondary | ICD-10-CM

## 2014-02-07 NOTE — Telephone Encounter (Signed)
Tried to call pt- LMOM 

## 2014-02-07 NOTE — Telephone Encounter (Signed)
Spoke with pt- she is having loose/watery stools 3-4 times a day. She is bloated every morning. No fever, no blood in stool. She is already taking a probiotic.  Lab order for cdiff sent to the lab.

## 2014-02-07 NOTE — Telephone Encounter (Signed)
Needs OV with R[facility] only.

## 2014-02-07 NOTE — Addendum Note (Signed)
Addended by: Claudina Lick on: 02/07/2014 02:40 PM   Modules accepted: Orders

## 2014-02-08 ENCOUNTER — Encounter: Payer: Self-pay | Admitting: Internal Medicine

## 2014-02-08 NOTE — Telephone Encounter (Signed)
Pt is aware of OV with R[facility] on 5/19 at 0930 and appt card mailed

## 2014-02-11 ENCOUNTER — Telehealth: Payer: Self-pay | Admitting: Gastroenterology

## 2014-02-11 ENCOUNTER — Other Ambulatory Visit: Payer: Self-pay | Admitting: Gastroenterology

## 2014-02-11 ENCOUNTER — Other Ambulatory Visit: Payer: Self-pay | Admitting: Internal Medicine

## 2014-02-11 DIAGNOSIS — A0472 Enterocolitis due to Clostridium difficile, not specified as recurrent: Secondary | ICD-10-CM

## 2014-02-11 LAB — CLOSTRIDIUM DIFFICILE BY PCR: Toxigenic C. Difficile by PCR: DETECTED — CR

## 2014-02-11 MED ORDER — FIDAXOMICIN 200 MG PO TABS: 200.0000 mg | ORAL_TABLET | Freq: Two times a day (BID) | ORAL | Status: DC

## 2014-02-11 NOTE — Telephone Encounter (Signed)
I spoke with pt- she is going to [place] for a vacation on 03/05/14. She continues to have a lot of bloating and gas. 2 days ago she had 1 solid stool and 1 flat semi-solid stool, yesterday she had diarrhea. No blood, no fever. She is taking a probiotic and avoiding dairy. She turned in her stool sample on Friday, I checked the [facility] website and those results are not available yet. She is concerned about her trip to [place] and wants to know if LSL could give her any recommendations for while she is gone.

## 2014-02-11 NOTE — Telephone Encounter (Signed)
I just addressed. See result note.

## 2014-02-11 NOTE — Telephone Encounter (Signed)
Patient has R/S her appointment w/[facility] on May 19th to May 26 due to being in [place], she is asking if Magda Paganini could please return her call, plase advise?

## 2014-02-11 NOTE — Telephone Encounter (Signed)
Pt's cdiff is positive.

## 2014-02-11 NOTE — Telephone Encounter (Signed)
Spoke with pt

## 2014-02-12 ENCOUNTER — Ambulatory Visit (HOSPITAL_COMMUNITY)
Admission: RE | Admit: 2014-02-12 | Discharge: 2014-02-12 | Disposition: A | Discharge status: Home / Self Care | Payer: Medicare Other | Source: Ambulatory Visit | Attending: Internal Medicine | Admitting: Internal Medicine

## 2014-02-12 DIAGNOSIS — R29898 Other symptoms and signs involving the musculoskeletal system: Secondary | ICD-10-CM | POA: Diagnosis not present

## 2014-02-12 DIAGNOSIS — M25569 Pain in unspecified knee: Secondary | ICD-10-CM | POA: Diagnosis not present

## 2014-02-12 DIAGNOSIS — M25669 Stiffness of unspecified knee, not elsewhere classified: Secondary | ICD-10-CM | POA: Diagnosis not present

## 2014-02-12 DIAGNOSIS — IMO0001 Reserved for inherently not codable concepts without codable children: Secondary | ICD-10-CM | POA: Insufficient documentation

## 2014-02-12 DIAGNOSIS — R262 Difficulty in walking, not elsewhere classified: Secondary | ICD-10-CM | POA: Diagnosis not present

## 2014-02-12 NOTE — Progress Notes (Signed)
Quick Note:  I would taper her over several weeks on the vanc.and coverwith Florastor  If she keeps having problems we could consider a FMT - if/when IRB active again ______

## 2014-02-12 NOTE — Evaluation (Signed)
Physical Therapy Evaluation  Patient Details  Name: Mary Hart MRN: [id number] Date of Birth: [birth year]  Today's Date: 02/12/2014 Time: 1010-1105 PT Time Calculation (min): 55 min Charge:  Evaluation 1010-1040 there ex 1045-1105             Visit#: 1 of 8  Re-eval: 03/14/14 Assessment Diagnosis: S/P arthroscopic surgery  Prior Therapy: none  Authorization: medicare       Past Medical History:  Past Medical History  Diagnosis Date  . GERD (gastroesophageal reflux disease)   . Hypothyroid   . Arthritis   . Neuropathy   . Depression   . IBS (irritable bowel syndrome)   . Clostridium difficile infection     treated with vancomycin  . Internal hemorrhoids   . Diverticula of colon     minimal   Past Surgical History:  Past Surgical History  Procedure Laterality Date  . Colonoscopy  06/20/2008    [facility] Dr. Wynelle Bourgeois external hemorrhoids  . Anterior cervical fusion x 2    . Joint replacement  09/2010    Thumb  . Vesicovaginal fistula closure w/ tah    . Cholecystectomy    . Esophagogastroduodenoscopy  11/25/10    Rourk- tubular esophagus s/p of 56 dilator from a small HH/otherwise normal  . Abdominal hysterectomy    . Colonoscopy  10/12/2012    Dr. Gala Romney- internal hemorrhoids, very early, few, sigmoid diverticula o/w normal appearing colon and terminal ileum. + cdiff on stool samples taken. Segmental bx negative.    Subjective Symptoms/Limitations Symptoms: Pt states that she had arthroscopic surgery on 01/23/2014.  She states that the MD told her that she had three meniscus tears and the worst was the medial aspect.  The Pt states that she is doing much better since the surgery.   She states that she needed to have the operation over a year ago but she has had three bouts of c-diff as well as taking care of her ailing husband who passed away in Dec 16, 2022.   She is currently is using a cane to walk with and she would like to get back to walking without the cane.    Pertinent History: Pt has tickets to fly to [facility] Mar 05 2014.   How long can you sit comfortably?: Pt is able to sit for an hour but her knee feels stiff.    How long can you stand comfortably?: Pt states she is able to stand for 10-15 minutes. How long can you walk comfortably?: Pt has walked with her cane for five to ten minutes.  She has not tried to walk greater than this so she does not know how far she could go but she does feel weak after this.  Special Tests: Pt is already doing quad set, heelslides and SLR.   Pain Assessment Currently in Pain?: No/denies (more tightness)      Balance Screening Balance Screen Has the patient fallen in the past 6 months: No   Sensation/Coordination/Flexibility/Functional Tests Functional Tests Functional Tests: foto 49  Assessment RLE AROM (degrees) Right Knee Extension: 10 Right Knee Flexion: 130 RLE Strength Right Hip Flexion: 3/5 Right Hip Extension: 3/5 Right Hip ABduction:  (4+/5) Right Ankle Dorsiflexion: 3+/5  Exercise/Treatments    Stretches Active Hamstring Stretch: 3 reps;30 seconds Aerobic Stationary Bike: nustep level 3 hills x 5'   Standing Heel Raises: 10 reps Functional Squat: 10 reps Other Standing Knee Exercises: gt trained with cane, (Pt using in wrong hand; cane too high)  Supine Quad Sets: 10 reps Straight Leg Raises: 10 reps Sidelying Hip ABduction: 10 reps Prone  Hamstring Curl: 10 reps Hip Extension: 10 reps      Physical Therapy Assessment and Plan PT Assessment and Plan Clinical Impression Statement: Pt is a 73 yo post Rt arthorscopic knee surgery who has been referred to Pt to return pt to prior functional level. Pt currently has decreased ROM, decrased strength and decreased activity tolerance.  She will benefit from skilled PT to resolve these issues and maximize her functional ability Pt will benefit from skilled therapeutic intervention in order to improve on the following deficits:  Decreased activity tolerance;Decreased range of motion;Difficulty walking;Decreased strength.  Pt taken through exercises with therapist facilitation to keep LE from IR during strengthening exercises.  Rehab Potential: Good PT Frequency: Min 2X/week PT Duration: 4 weeks PT Treatment/Interventions: Gait training;Therapeutic exercise;Therapeutic activities;Patient/family education;Manual techniques PT Plan: begin gentle PROM for extension, rocker board, SLS, gait training without cane and have go short distances at home without cane ie bathroom begin lateral step ups    Goals Home Exercise Program Pt/caregiver will Perform Home Exercise Program: For increased strengthening PT Short Term Goals Time to Complete Short Term Goals: 2 weeks PT Short Term Goal 1: Pt ROM to be 0 to allow normalized walking PT Short Term Goal 2: Pt to be walking inside without a cane PT Short Term Goal 3: Pt to be able to sit for an hour without feeling stiffness PT Short Term Goal 4: Pt to be able to stand for 20 minutes to make a quick meal  PT Short Term Goal 5: Pt to be able to walk for 30 minutes for better health habits. PT Long Term Goals Time to Complete Long Term Goals: 4 weeks PT Long Term Goal 1: Pt to be walking inside and outside without an assistive device PT Long Term Goal 2: Pt to be able to sit for two hours with comfort for traveling Long Term Goal 3: Pt to be able to walk for 40 minutes for better health habits.  Problem List Patient Active Problem List   Diagnosis Date Noted  . Leg weakness 02/12/2014  . Stiffness of joint, not elsewhere classified, lower leg 02/12/2014  . Difficulty in walking(719.7) 02/12/2014  . Recurrent colitis due to Clostridium difficile 07/03/2013  . LLQ pain 10/04/2012  . Constipation 05/04/2012  . Irritable bowel syndrome 05/04/2012  . GERD (gastroesophageal reflux disease) 02/14/2012  . Dysphagia 02/14/2012  . NAUSEA 11/18/2010  . ABDOMINAL PAIN-EPIGASTRIC  11/18/2010    General Behavior During Therapy: WFL for tasks assessed/performed PT Plan of Care PT Home Exercise Plan: given  GP Functional Assessment Tool Used: foto Functional Limitation: Mobility: Walking and moving around Mobility: Walking and Moving Around Current Status (F8101): At least 40 percent but less than 60 percent impaired, limited or restricted Mobility: Walking and Moving Around Goal Status [phone]): At least 20 percent but less than 40 percent impaired, limited or restricted  Leeroy Cha 02/12/2014, 11:25 AM  Physician Documentation Your signature is required to indicate approval of the treatment plan as stated above.  Please sign and either send electronically or make a copy of this report for your files and return this physician signed original.   Please mark one 1.__approve of plan  2. ___approve of plan with the following conditions.   ______________________________  _____________________ Physician Signature                                                                                                             Date

## 2014-02-13 ENCOUNTER — Telehealth: Payer: Self-pay | Admitting: Internal Medicine

## 2014-02-13 NOTE — Telephone Encounter (Signed)
Patient scheduled for 02/28/14 10;15.  leann notified

## 2014-02-14 ENCOUNTER — Ambulatory Visit (HOSPITAL_COMMUNITY)
Admission: RE | Admit: 2014-02-14 | Discharge: 2014-02-14 | Disposition: A | Discharge status: Home / Self Care | Payer: Medicare Other | Source: Ambulatory Visit | Attending: Internal Medicine | Admitting: Internal Medicine

## 2014-02-14 NOTE — Progress Notes (Signed)
Physical Therapy Treatment Patient Details  Name: Mary Hart MRN: [id number] Date of Birth: August 04, 1941  Today's Date: 02/14/2014 Time: 0935-1024 PT Time Calculation (min): 49 min Charges:  therex 43  Visit#: 2 of 8  Re-eval: 03/14/14 Authorization: medicare  Authorization Visit#: 2 of 10   Subjective: Symptoms/Limitations Symptoms: Pt states she currently is without pain.  Reports compliance wtih HEP and has began walking 0.2 miles 2X day without pain.  STates her ankle swole a little after last session.  Pain Assessment Currently in Pain?: No/denies   Exercise/Treatments Stretches Active Hamstring Stretch: 3 reps;30 seconds Gastroc Stretch: 3 reps;30 seconds Aerobic Stationary Bike: nustep level 3 hills 2  x 8' Standing Heel Raises: 10 reps;Limitations Heel Raises Limitations: toeraises 10 reps Lateral Step Up: Right;10 reps;Step Height: 4";Hand Hold: 1 Functional Squat: 10 reps SLS: max 3 reps 30" Other Standing Knee Exercises: without AD Supine Quad Sets: 10 reps Straight Leg Raises: 10 reps Sidelying Hip ABduction: 10 reps Prone  Hamstring Curl: 10 reps Hip Extension: 10 reps      Physical Therapy Assessment and Plan PT Assessment and Plan Clinical Impression Statement: Pt progressing well and without pain.  Pt able to ambulate without AD without any gait instability, normal stride and stance.  Pt with noted knee extension lag, however still with swelling and quad weakness and will likely return in time. Pt eager to walk without AD and drive and states MD has instructed to go by therapists recommendation.  Suggested patient drive with CG in parking lot and insure safely and comfort.  Pt is safe without AD and instructed to ambulate without as long as pain does not increase.  Added new standing exericises and reviewed HEP all without difficulty and good form.    PT Plan: Progress standing exericses; add step ups/downs and lunges.  discharge mat exercises for  HEP     Problem List Patient Active Problem List   Diagnosis Date Noted  . Leg weakness 02/12/2014  . Stiffness of joint, not elsewhere classified, lower leg 02/12/2014  . Difficulty in walking(719.7) 02/12/2014  . Recurrent colitis due to Clostridium difficile 07/03/2013  . LLQ pain 10/04/2012  . Constipation 05/04/2012  . Irritable bowel syndrome 05/04/2012  . GERD (gastroesophageal reflux disease) 02/14/2012  . Dysphagia 02/14/2012  . NAUSEA 11/18/2010  . ABDOMINAL PAIN-EPIGASTRIC 11/18/2010    General Behavior During Therapy: [facility] for tasks assessed/performed PT Plan of Care PT Home Exercise Plan: given  GP Functional Limitation: Mobility: Walking and moving around  Teena Irani, PTA/CLT 02/14/2014, 10:27 AM

## 2014-02-19 ENCOUNTER — Ambulatory Visit (HOSPITAL_COMMUNITY)
Admission: RE | Admit: 2014-02-19 | Discharge: 2014-02-19 | Disposition: A | Discharge status: Home / Self Care | Payer: Medicare Other | Source: Ambulatory Visit | Attending: Internal Medicine | Admitting: Internal Medicine

## 2014-02-19 NOTE — Progress Notes (Signed)
Physical Therapy Treatment Patient Details  Name: MOSETTA FERDINAND MRN: [id number] Date of Birth: 05-11-1941  Today's Date: 02/19/2014 Time: 6599-3570 PT Time Calculation (min): 39 min Charges: Therex  X 39' [phone])  Visit#: 3 of 8  Re-eval: 03/14/14    Authorization: medicare   Authorization Visit#: 3 of 10   Subjective: Symptoms/Limitations Symptoms: Pt stated that her R ankle swelled after the last session. Pain Assessment Currently in Pain?: No/denies   Exercise/Treatments  Stretches Active Hamstring Stretch: 2 reps;30 seconds Gastroc Stretch: 2 reps;30 seconds Standing Heel Raises: 10 reps;Limitations Heel Raises Limitations: toeraises 10 reps Forward Lunges: Right;10 reps Side Lunges: Right;10 reps Lateral Step Up: Right;10 reps;Step Height: 4" Forward Step Up: Right;10 reps;Step Height: 4" Step Down: Right;10 reps;Step Height: 4" Functional Squat: 10 reps Sidelying Hip ABduction: AROM;Right;1 set;10 reps Prone  Hamstring Curl: 10 reps Hip Extension: 10 reps     Physical Therapy Assessment and Plan PT Assessment and Plan Clinical Impression Statement: Session focused on strengthening and stretching R knee per PT POC. Continued all R knee strengthening exercises to increase functional strength. Began step ups/step downs to increase functional independence. Began forward and side lunges to increase ROM and weightshifting to R knee.  Reviewed HEP exercises secondary to pt report of bad form. Pt denied pain after treatment. Pt was advised to ice R knee at home and to elevate R ankle to aid in reducing swelling. This entire session was guided, instructed, and directly supervised by Rachelle Hora, PTA. PT Plan: Progress standing exericses; continue step ups/downs and lunges.       Problem List Patient Active Problem List   Diagnosis Date Noted  . Leg weakness 02/12/2014  . Stiffness of joint, not elsewhere classified, lower leg 02/12/2014  . Difficulty in  walking(719.7) 02/12/2014  . Recurrent colitis due to Clostridium difficile 07/03/2013  . LLQ pain 10/04/2012  . Constipation 05/04/2012  . Irritable bowel syndrome 05/04/2012  . GERD (gastroesophageal reflux disease) 02/14/2012  . Dysphagia 02/14/2012  . NAUSEA 11/18/2010  . ABDOMINAL PAIN-EPIGASTRIC 11/18/2010    PT - End of Session Activity Tolerance: Patient tolerated treatment well General Behavior During Therapy: [facility] for tasks assessed/performed PT Plan of Care PT Home Exercise Plan: given   Ahmed Prima, SPTA 02/19/2014, 3:28 PM

## 2014-02-20 DIAGNOSIS — M47817 Spondylosis without myelopathy or radiculopathy, lumbosacral region: Secondary | ICD-10-CM | POA: Diagnosis not present

## 2014-02-20 DIAGNOSIS — M4712 Other spondylosis with myelopathy, cervical region: Secondary | ICD-10-CM | POA: Diagnosis not present

## 2014-02-20 DIAGNOSIS — M531 Cervicobrachial syndrome: Secondary | ICD-10-CM | POA: Diagnosis not present

## 2014-02-20 DIAGNOSIS — M5030 Other cervical disc degeneration, unspecified cervical region: Secondary | ICD-10-CM | POA: Diagnosis not present

## 2014-02-20 DIAGNOSIS — M47814 Spondylosis without myelopathy or radiculopathy, thoracic region: Secondary | ICD-10-CM | POA: Diagnosis not present

## 2014-02-20 DIAGNOSIS — M999 Biomechanical lesion, unspecified: Secondary | ICD-10-CM | POA: Diagnosis not present

## 2014-02-20 DIAGNOSIS — M9981 Other biomechanical lesions of cervical region: Secondary | ICD-10-CM | POA: Diagnosis not present

## 2014-02-21 ENCOUNTER — Ambulatory Visit (HOSPITAL_COMMUNITY)
Admission: RE | Admit: 2014-02-21 | Discharge: 2014-02-21 | Disposition: A | Discharge status: Home / Self Care | Payer: Medicare Other | Source: Ambulatory Visit | Attending: Internal Medicine | Admitting: Internal Medicine

## 2014-02-21 DIAGNOSIS — R29898 Other symptoms and signs involving the musculoskeletal system: Secondary | ICD-10-CM

## 2014-02-21 DIAGNOSIS — R262 Difficulty in walking, not elsewhere classified: Secondary | ICD-10-CM

## 2014-02-21 DIAGNOSIS — M25669 Stiffness of unspecified knee, not elsewhere classified: Secondary | ICD-10-CM

## 2014-02-21 NOTE — Progress Notes (Signed)
Physical Therapy Treatment Patient Details  Name: Mary Hart MRN: [id number] Date of Birth: 1941/07/24  Today's Date: 02/21/2014 Time: 6644-0347 PT Time Calculation (min): 41 min Charge: TE 4259-5638  Visit#: 4 of 8  Re-eval: 03/14/14 Assessment Diagnosis: S/P arthroscopic surgery   Authorization: medicare  Authorization Time Period:    Authorization Visit#: 4 of 10   Subjective: Symptoms/Limitations Symptoms: Pt stated knee is ok, main pain today in Rt ankle.  Pt continues to wear tedhose for compression.  Pain scale 3/10  Pt stated most difficulty with straigthening Pain Assessment Currently in Pain?: Yes Pain Score: 3  Pain Location: Ankle Pain Orientation: Right Multiple Pain Sites: Yes  Objective:   Exercise/Treatments Stretches Active Hamstring Stretch: 3 reps;30 seconds Gastroc Stretch: 3 reps;30 seconds;Limitations Gastroc Stretch Limitations: slant board Aerobic Stationary Bike: nustep level 3 hills 2  x 8' Standing Terminal Knee Extension: Right;10 reps;Theraband Theraband Level (Terminal Knee Extension): Level 4 (Blue) Lateral Step Up: Right;15 reps;Step Height: 4" Forward Step Up: Right;15 reps;Hand Hold: 0;Step Height: 4" Step Down: Right;15 reps;Hand Hold: 0;Step Height: 4" Functional Squat: 15 reps Supine Quad Sets: 15 reps;Limitations Quad Sets Limitations: max tactile cueing for quad contraction, cueing to relax gluteal contraction Short Arc Quad Sets: Right;15 reps Terminal Knee Extension: Right;15 reps Prone  Prone Knee Hang: 3 minutes;Limitations Prone Knee Hang Limitations: with manual hamstring massage to reduce tightness for knee extension      Physical Therapy Assessment and Plan PT Assessment and Plan Clinical Impression Statement: Session focus on quad strengthening and stretches to improve Rt knee extension.  THerapist facilitation required for proper quadricep contraction with tactile cueing to relax gluteal contraction, good  results with supine TKE and SAQ.  Improved knee AROM to 6 degrees extension following facilitation.  Instructed prone knee hang to add to HEP to assist with knee extension with manual STM to hamstrings for relaxation to improve knee extension.  Added standing TKE without difficulty.  Pt improving functional strengthenign wtih stair training 4in, ready to increase height next session to [place]..  Pt stated pain reduced with exercises, plans to apply ice to knee at home for pain and edema control.  Referral faxed to MD for compression hose to assist with edema prior trip to [place] coming up. PT Plan: Progress to 6in stair training.  Continue focus on quad strengthening exercises to improve extension.  F/u on referal for compression hose.  Re-eval prior trip to [place] in 2-3 more sessions.      Goals Home Exercise Program Pt/caregiver will Perform Home Exercise Program: For increased strengthening PT Short Term Goals Time to Complete Short Term Goals: 2 weeks PT Short Term Goal 1: Pt ROM to be 0 to allow normalized walking PT Short Term Goal 1 - Progress: Progressing toward goal PT Short Term Goal 2: Pt to be walking inside without a cane PT Short Term Goal 2 - Progress: Progressing toward goal PT Short Term Goal 3: Pt to be able to sit for an hour without feeling stiffness PT Short Term Goal 3 - Progress: Progressing toward goal PT Short Term Goal 4: Pt to be able to stand for 20 minutes to make a quick meal  PT Short Term Goal 5: Pt to be able to walk for 30 minutes for better health habits. PT Short Term Goal 5 - Progress: Progressing toward goal PT Long Term Goals Time to Complete Long Term Goals: 4 weeks PT Long Term Goal 1: Pt to be walking inside and outside without  an assistive device PT Long Term Goal 2: Pt to be able to sit for two hours with comfort for traveling Long Term Goal 3: Pt to be able to walk for 40 minutes for better health habits.  Problem List Patient Active Problem List    Diagnosis Date Noted  . Leg weakness 02/12/2014  . Stiffness of joint, not elsewhere classified, lower leg 02/12/2014  . Difficulty in walking(719.7) 02/12/2014  . Recurrent colitis due to Clostridium difficile 07/03/2013  . LLQ pain 10/04/2012  . Constipation 05/04/2012  . Irritable bowel syndrome 05/04/2012  . GERD (gastroesophageal reflux disease) 02/14/2012  . Dysphagia 02/14/2012  . NAUSEA 11/18/2010  . ABDOMINAL PAIN-EPIGASTRIC 11/18/2010    PT - End of Session Activity Tolerance: Patient tolerated treatment well General Behavior During Therapy: [facility] for tasks assessed/performed  GP    Aldona Lento 02/21/2014, 5:06 PM

## 2014-02-26 ENCOUNTER — Ambulatory Visit (HOSPITAL_COMMUNITY)
Admission: RE | Admit: 2014-02-26 | Discharge: 2014-02-26 | Disposition: A | Discharge status: Home / Self Care | Payer: Medicare Other | Source: Ambulatory Visit | Attending: Internal Medicine | Admitting: Internal Medicine

## 2014-02-26 DIAGNOSIS — R262 Difficulty in walking, not elsewhere classified: Secondary | ICD-10-CM | POA: Diagnosis not present

## 2014-02-26 DIAGNOSIS — IMO0001 Reserved for inherently not codable concepts without codable children: Secondary | ICD-10-CM | POA: Diagnosis not present

## 2014-02-26 DIAGNOSIS — R29898 Other symptoms and signs involving the musculoskeletal system: Secondary | ICD-10-CM

## 2014-02-26 DIAGNOSIS — M25669 Stiffness of unspecified knee, not elsewhere classified: Secondary | ICD-10-CM

## 2014-02-26 DIAGNOSIS — M25569 Pain in unspecified knee: Secondary | ICD-10-CM | POA: Diagnosis not present

## 2014-02-26 NOTE — Progress Notes (Signed)
Physical Therapy Treatment Patient Details  Name: Mary Hart MRN: [id number] Date of Birth: 08-17-1941  Today's Date: 02/26/2014 Time: 3254-9826 PT Time Calculation (min): 42 min Charges: Therex x 30' Manual x 10'  Visit#: 5 of 8  Re-eval: 03/14/14  Authorization: medicare  Authorization Visit#: 5 of 10   Subjective: Symptoms/Limitations Symptoms: Pt states tha she feels very tired today. Pain Assessment Currently in Pain?: No/denies  Exercise/Treatments Stretches Active Hamstring Stretch: Limitations Active Hamstring Stretch Limitations: 10x 3sec at 8" in parallel bars Gastroc Stretch: Limitations Gastroc Stretch Limitations: slant board 10 x 3" Aerobic Stationary Bike: nustep level 3 hills 2  x 8' Standing Terminal Knee Extension: Right;10 reps;Theraband Theraband Level (Terminal Knee Extension): Level 4 (Blue) Supine Quad Sets: 15 reps;Right Short Arc Quad Sets: 15 reps;Right Terminal Knee Extension: 15 reps;Right   Manual Therapy Manual Therapy: Myofascial release Myofascial Release: Gentle MFR to right medial knee to decrease pain and adhesions Other Manual Therapy: Patella mobilization all directions; Grade II A/P joint mobilization to right knee to improve motion  Physical Therapy Assessment and Plan PT Assessment and Plan Clinical Impression Statement: Treatment focus on improving right knee extension. Pt requires multimodal cueing to avoid gluteal contraction with supine quad strengthening. Pt responds well to muscle tapping to facilitate distal quad contraction. Manual techniques completed to improve knee mobility. Pt declines ice at end of session. Encouraged pt to ice at home.  PT Plan:  Continue focus on quad strengthening exercises to improve extension.     Problem List Patient Active Problem List   Diagnosis Date Noted  . Leg weakness 02/12/2014  . Stiffness of joint, not elsewhere classified, lower leg 02/12/2014  . Difficulty in walking(719.7)  02/12/2014  . Recurrent colitis due to Clostridium difficile 07/03/2013  . LLQ pain 10/04/2012  . Constipation 05/04/2012  . Irritable bowel syndrome 05/04/2012  . GERD (gastroesophageal reflux disease) 02/14/2012  . Dysphagia 02/14/2012  . NAUSEA 11/18/2010  . ABDOMINAL PAIN-EPIGASTRIC 11/18/2010    PT - End of Session Activity Tolerance: Patient tolerated treatment well General Behavior During Therapy: [facility] for tasks assessed/performed  Rachelle Hora, PTA  02/26/2014, 4:29 PM

## 2014-02-28 ENCOUNTER — Ambulatory Visit (HOSPITAL_COMMUNITY)
Admission: RE | Admit: 2014-02-28 | Discharge: 2014-02-28 | Disposition: A | Discharge status: Home / Self Care | Payer: Medicare Other | Source: Ambulatory Visit | Attending: Internal Medicine | Admitting: Internal Medicine

## 2014-02-28 ENCOUNTER — Encounter: Payer: Self-pay | Admitting: Internal Medicine

## 2014-02-28 ENCOUNTER — Ambulatory Visit (INDEPENDENT_AMBULATORY_CARE_PROVIDER_SITE_OTHER): Payer: Medicare Other | Admitting: Internal Medicine

## 2014-02-28 VITALS — BP 108/60 | HR 88 | Ht 63.0 in | Wt 131.2 lb

## 2014-02-28 DIAGNOSIS — R262 Difficulty in walking, not elsewhere classified: Secondary | ICD-10-CM | POA: Diagnosis not present

## 2014-02-28 DIAGNOSIS — A0472 Enterocolitis due to Clostridium difficile, not specified as recurrent: Secondary | ICD-10-CM

## 2014-02-28 DIAGNOSIS — IMO0001 Reserved for inherently not codable concepts without codable children: Secondary | ICD-10-CM | POA: Diagnosis not present

## 2014-02-28 DIAGNOSIS — R29898 Other symptoms and signs involving the musculoskeletal system: Secondary | ICD-10-CM | POA: Diagnosis not present

## 2014-02-28 DIAGNOSIS — A0471 Enterocolitis due to Clostridium difficile, recurrent: Secondary | ICD-10-CM

## 2014-02-28 DIAGNOSIS — M25569 Pain in unspecified knee: Secondary | ICD-10-CM | POA: Diagnosis not present

## 2014-02-28 DIAGNOSIS — K589 Irritable bowel syndrome without diarrhea: Secondary | ICD-10-CM

## 2014-02-28 DIAGNOSIS — M25669 Stiffness of unspecified knee, not elsewhere classified: Secondary | ICD-10-CM | POA: Diagnosis not present

## 2014-02-28 NOTE — Progress Notes (Signed)
         Subjective:    Patient ID: Mary Hart, female    DOB: 03-18-1941, 73 y.o.   MRN: [id number]  HPI Patient is here for followup, had seen her in the past, she generally follows with Dr. Ronalee Belts Rourk's office but because she is a potential candidate for fecal microbiotica transplant she is here. She had several episodes of C. difficile last year, which finally got under control. She had knee surgery which was successful, she had IV vancomycin to prevent infection for that and subsequently has developed recurrent C. difficile colitis with a positive PCR. She is on 3 times a day vancomycin 125 mg and Florastor at this point and she is improved. However she describes a sore abdomen feels bloated and she is asked to have her take a laxative once or twice to feel better. She is having small relatively formed bowel movements and is having some incomplete defecation and rectal discomfort but no bleeding. She feels somewhat wiped out but that is improving. Medications, allergies, past medical history, past surgical history, family history and social history are reviewed and updated in the EMR.   Review of Systems Knee surgery was successful.    Objective:   Physical Exam General:  NAD Abdomen:  Soft, mildly tender LLQ, BS+    Data Reviewed:  Previous GI and ID notes, labs. These are in the EMR.    Assessment & Plan:   Recurrent colitis due to Clostridium difficile Improving on vancomycin at this point. I think she has some underlying IBS as well. To finish taper and see me in early June If fails then would try FMT but would first restart vancomycin. I don't think she has a close family member that could be able to serve as a donor as she says her stepdaughter is not healthy,  which is problematic. A followup me in early June, and she will most likely cancel her appointment with Dr. Gala Romney given that if she has recurrent issues the next steps would need to take place through me and Dr.  Baxter Flattery He was asking about using keffer, and I asked her to hold off on that we'll she is under treatment. She is currently off her PPI which is reasonable, I've advised using and antacid like Gaviscon as needed or perhaps Pepcid complete for her heartburn. The Gaviscon would be preferred  Irritable bowel syndrome Think she has some infectious/postinfectious IBS issues. She actually took a Dulcolax once or twice, I told her if she needed to do that was not likely to be a problem with that if he used rarely, MiraLax or milk of magnesia or other options. She is using some Gas-X which is fine as well. I think this means that she has clearly responded to therapy hopefully we can eradicate the C. difficile.   Cc: R. Garfield Cornea, MD, Neil Crouch, PA-C, Carlyle Basques, MD, [employer], MD

## 2014-02-28 NOTE — Assessment & Plan Note (Addendum)
Improving on vancomycin at this point. I think she has some underlying IBS as well. To finish taper and see me in early June If fails then would try FMT but would first restart vancomycin. I don't think she has a close family member that could be able to serve as a donor as she says her stepdaughter is not healthy,  which is problematic. A followup me in early June, and she will most likely cancel her appointment with Dr. Gala Romney given that if she has recurrent issues the next steps would need to take place through me and Dr. Baxter Flattery He was asking about using keffer, and I asked her to hold off on that we'll she is under treatment. She is currently off her PPI which is reasonable, I've advised using and antacid like Gaviscon as needed or perhaps Pepcid complete for her heartburn. The Gaviscon would be preferred

## 2014-02-28 NOTE — Assessment & Plan Note (Addendum)
Think she has some infectious/postinfectious IBS issues. She actually took a Dulcolax once or twice, I told her if she needed to do that was not likely to be a problem with that if he used rarely, MiraLax or milk of magnesia or other options. She is using some Gas-X which is fine as well. I think this means that she has clearly responded to therapy hopefully we can eradicate the C. difficile.

## 2014-02-28 NOTE — Progress Notes (Signed)
Physical Therapy Treatment Patient Details  Name: Mary Hart MRN: [id number] Date of Birth: 03/29/1941  Today's Date: 02/28/2014 Time: 0712-1975 PT Time Calculation (min): 45 min Charges: Therex x 88'(3254-9826) Manual x 41'(5830-9407)  Visit#: 6 of 8  Re-eval: 03/14/14  Authorization: medicare  Authorization Visit#: 6 of 10   Subjective: Symptoms/Limitations Symptoms: Pt reports continued lethargy. Pain Assessment Pain Score: 3  Pain Location: Knee Pain Orientation: Right   Exercise/Treatments Stretches Active Hamstring Stretch: Limitations Active Hamstring Stretch Limitations: 10x 3sec at 8" in parallel bars Gastroc Stretch: Limitations Gastroc Stretch Limitations: slant board 10 x 3" Standing Forward Lunges: 10 reps;Right;Limitations Forward Lunges Limitations: on 8" box Terminal Knee Extension: Right;10 reps;Theraband Theraband Level (Terminal Knee Extension): Level 4 (Blue) Lateral Step Up: Right;15 reps;Step Height: 4" Rocker Board: 2 minutes;Limitations Rocker Board Limitations: right/left Supine Quad Sets: 15 reps;Right Short Arc Quad Sets: 15 reps;Right Terminal Knee Extension: 15 reps;Right   Manual Therapy Myofascial Release: Gentle MFR to right medial knee to decrease pain and adhesions Other Manual Therapy: Patella mobilization all directions; Grade II A/P joint mobilization to right knee to improve motion  Physical Therapy Assessment and Plan PT Assessment and Plan Clinical Impression Statement: Pt completes therex well after initial cueing and demo. Improve distal quad coordination noted with quad sets, TKE and SAQ. Continued with manual techniques to right medial knee to decrease fascial restrictions and pain. Pt declines ice and states that she will ice knee at home. Pt reports pain decrease to 0/10 at end of session. PT Plan: Continue focus on quad strengthening exercises to improve extension and manual techniques to decrease fascial  restrictions.    Goals    Problem List Patient Active Problem List   Diagnosis Date Noted  . Leg weakness 02/12/2014  . Stiffness of joint, not elsewhere classified, lower leg 02/12/2014  . Difficulty in walking(719.7) 02/12/2014  . Recurrent colitis due to Clostridium difficile 07/03/2013  . LLQ pain 10/04/2012  . Constipation 05/04/2012  . Irritable bowel syndrome 05/04/2012  . GERD (gastroesophageal reflux disease) 02/14/2012  . Dysphagia 02/14/2012  . NAUSEA 11/18/2010  . ABDOMINAL PAIN-EPIGASTRIC 11/18/2010    PT - End of Session Activity Tolerance: Patient tolerated treatment well General Behavior During Therapy: [facility] for tasks assessed/performed  Rachelle Hora, PTA  02/28/2014, 4:14 PM

## 2014-02-28 NOTE — Patient Instructions (Addendum)
You have been given a separate informational sheet regarding your tobacco use, the importance of quitting and local resources to help you quit.  Today we are providing you with samples of Florastor.  We will see you in Mary Hart (03/26/14 at 3:15pm), but call us back before if having more problems.    I appreciate the opportunity to care for you.

## 2014-03-04 ENCOUNTER — Ambulatory Visit (HOSPITAL_COMMUNITY)
Admission: RE | Admit: 2014-03-04 | Discharge: 2014-03-04 | Disposition: A | Discharge status: Home / Self Care | Payer: Medicare Other | Source: Ambulatory Visit | Attending: Internal Medicine | Admitting: Internal Medicine

## 2014-03-04 DIAGNOSIS — M25569 Pain in unspecified knee: Secondary | ICD-10-CM | POA: Diagnosis not present

## 2014-03-04 DIAGNOSIS — M9981 Other biomechanical lesions of cervical region: Secondary | ICD-10-CM | POA: Diagnosis not present

## 2014-03-04 DIAGNOSIS — M4712 Other spondylosis with myelopathy, cervical region: Secondary | ICD-10-CM | POA: Diagnosis not present

## 2014-03-04 DIAGNOSIS — M5030 Other cervical disc degeneration, unspecified cervical region: Secondary | ICD-10-CM | POA: Diagnosis not present

## 2014-03-04 DIAGNOSIS — M999 Biomechanical lesion, unspecified: Secondary | ICD-10-CM | POA: Diagnosis not present

## 2014-03-04 DIAGNOSIS — M25669 Stiffness of unspecified knee, not elsewhere classified: Secondary | ICD-10-CM | POA: Diagnosis not present

## 2014-03-04 DIAGNOSIS — M47817 Spondylosis without myelopathy or radiculopathy, lumbosacral region: Secondary | ICD-10-CM | POA: Diagnosis not present

## 2014-03-04 DIAGNOSIS — M47814 Spondylosis without myelopathy or radiculopathy, thoracic region: Secondary | ICD-10-CM | POA: Diagnosis not present

## 2014-03-04 DIAGNOSIS — IMO0001 Reserved for inherently not codable concepts without codable children: Secondary | ICD-10-CM | POA: Diagnosis not present

## 2014-03-04 DIAGNOSIS — M531 Cervicobrachial syndrome: Secondary | ICD-10-CM | POA: Diagnosis not present

## 2014-03-04 DIAGNOSIS — R262 Difficulty in walking, not elsewhere classified: Secondary | ICD-10-CM | POA: Diagnosis not present

## 2014-03-04 DIAGNOSIS — R29898 Other symptoms and signs involving the musculoskeletal system: Secondary | ICD-10-CM | POA: Diagnosis not present

## 2014-03-04 NOTE — Evaluation (Addendum)
Physical Therapy Evaluation  Patient Details  Name: REDINA ZELLER MRN: [id number] Date of Birth: Aug 27, 1941  Today's Date: 03/04/2014 Time: 0932-1018 PT Time Calculation (min): 46 min Charges: MMT/ROMM x 1(7001-7494) Manual x 49'(6759-1638)              Visit#: 7 of 8  Re-eval: 03/14/14  Authorization: medicare    Authorization Visit#: 7 of 10   Past Medical History:  Past Medical History  Diagnosis Date  . GERD (gastroesophageal reflux disease)   . Hypothyroid   . Arthritis   . Neuropathy   . Depression   . IBS (irritable bowel syndrome)   . Clostridium difficile infection     treated with vancomycin  . Internal hemorrhoids   . Diverticula of colon     minimal   Past Surgical History:  Past Surgical History  Procedure Laterality Date  . Colonoscopy  06/20/2008    [place] Dr. Wynelle Bourgeois external hemorrhoids  . Anterior cervical fusion x 2    . Joint replacement  09/2010    Thumb  . Vesicovaginal fistula closure w/ tah    . Cholecystectomy    . Esophagogastroduodenoscopy  11/25/10    Rourk- tubular esophagus s/p of 56 dilator from a small HH/otherwise normal  . Abdominal hysterectomy    . Colonoscopy  10/12/2012    Dr. Gala Romney- internal hemorrhoids, very early, few, sigmoid diverticula o/w normal appearing colon and terminal ileum. + cdiff on stool samples taken. Segmental bx negative.    Subjective Pain Assessment Currently in Pain?: No/denies  RLE AROM (degrees) Right Knee Extension: 0 (was 10 on 02/12/14) Right Knee Flexion: 130 (was 10 on 02/12/14) RLE Strength Right Hip Flexion: 5/5 (was 3/5 on 02/12/14) Right Hip Extension: 5/5 (was 3/5 on 02/12/14) Right Hip ABduction: 5/5 (was 4+/5 on 02/12/14) Right Knee Flexion:  (4+/5 (not measured at initial eval)) Right Knee Extension: 5/5 ((not measured at initial eval)) Right Ankle Dorsiflexion: 5/5 (was 3+/5 on 02/12/14)  Exercise/Treatments NuStep x 8' L4 hills #3 to improve strength and activity tolerance  Manual  Therapy Myofascial Release: Gentle MFR to right medial knee to decrease pain and   Physical Therapy Assessment and Plan PT Assessment and Plan Clinical Impression Statement: Pt appears to be progressing well overall. Pt displays improved strength and ROM in right knee. Pt does lack functional strength and continues to have tenderness throughout right medial knee. Pt would benefit from continuing skilled PT to improve functional strength and decrease pain to improve quality of life. PT Plan: Recommend to hold PT 1 week secondary to vacation then continue skilled PT 2 x per week for 3 weeks.    Goals Home Exercise Program Pt/caregiver will Perform Home Exercise Program: For increased strengthening PT Short Term Goals Time to Complete Short Term Goals: 2 weeks PT Short Term Goal 1: Pt ROM to be 0 to allow normalized walking PT Short Term Goal 1 - Progress: Met PT Short Term Goal 2: Pt to be walking inside without a cane PT Short Term Goal 2 - Progress: Met PT Short Term Goal 3: Pt to be able to sit for an hour without feeling stiffness PT Short Term Goal 3 - Progress: Progressing toward goal PT Short Term Goal 4: Pt to be able to stand for 20 minutes to make a quick meal  PT Short Term Goal 4 - Progress: Met PT Short Term Goal 5: Pt to be able to walk for 30 minutes for better health habits. PT Short Term Goal  5 - Progress: Progressing toward goal PT Long Term Goals Time to Complete Long Term Goals: 4 weeks PT Long Term Goal 1: Pt to be walking inside and outside without an assistive device PT Long Term Goal 1 - Progress: Met PT Long Term Goal 2: Pt to be able to sit for two hours with comfort for traveling PT Long Term Goal 2 - Progress: Progressing toward goal Long Term Goal 3: Pt to be able to walk for 40 minutes for better health habits. Long Term Goal 3 Progress: Progressing toward goal  Problem List Patient Active Problem List   Diagnosis Date Noted  . Leg weakness 02/12/2014   . Stiffness of joint, not elsewhere classified, lower leg 02/12/2014  . Difficulty in walking(719.7) 02/12/2014  . Recurrent colitis due to Clostridium difficile 07/03/2013  . LLQ pain 10/04/2012  . Constipation 05/04/2012  . Irritable bowel syndrome 05/04/2012  . GERD (gastroesophageal reflux disease) 02/14/2012  . Dysphagia 02/14/2012  . NAUSEA 11/18/2010  . ABDOMINAL PAIN-EPIGASTRIC 11/18/2010    PT - End of Session Activity Tolerance: Patient tolerated treatment well General Behavior During Therapy: [facility] for tasks assessed/performed  Rachelle Hora, PTA  03/04/2014, 1:09 PM  Physician Documentation Your signature is required to indicate approval of the treatment plan as stated above.  Please sign and either send electronically or make a copy of this report for your files and return this physician signed original.   Please mark one 1.__approve of plan  2. ___approve of plan with the following conditions.   ______________________________                                                          _____________________ Physician Signature                                                                                                             Date

## 2014-03-05 ENCOUNTER — Ambulatory Visit (HOSPITAL_COMMUNITY): Payer: Medicare Other | Admitting: *Deleted

## 2014-03-07 ENCOUNTER — Ambulatory Visit (HOSPITAL_COMMUNITY): Payer: Medicare Other | Admitting: Physical Therapy

## 2014-03-12 ENCOUNTER — Ambulatory Visit: Payer: Medicare Other | Admitting: Internal Medicine

## 2014-03-14 ENCOUNTER — Ambulatory Visit (HOSPITAL_COMMUNITY)
Admission: RE | Admit: 2014-03-14 | Discharge: 2014-03-14 | Disposition: A | Discharge status: Home / Self Care | Payer: Medicare Other | Source: Ambulatory Visit | Attending: Internal Medicine | Admitting: Internal Medicine

## 2014-03-14 DIAGNOSIS — M25569 Pain in unspecified knee: Secondary | ICD-10-CM | POA: Diagnosis not present

## 2014-03-14 DIAGNOSIS — M25669 Stiffness of unspecified knee, not elsewhere classified: Secondary | ICD-10-CM | POA: Diagnosis not present

## 2014-03-14 DIAGNOSIS — R29898 Other symptoms and signs involving the musculoskeletal system: Secondary | ICD-10-CM | POA: Diagnosis not present

## 2014-03-14 DIAGNOSIS — R262 Difficulty in walking, not elsewhere classified: Secondary | ICD-10-CM | POA: Diagnosis not present

## 2014-03-14 DIAGNOSIS — IMO0001 Reserved for inherently not codable concepts without codable children: Secondary | ICD-10-CM | POA: Diagnosis not present

## 2014-03-14 NOTE — Progress Notes (Signed)
Physical Therapy Re-evaluation / Discharge  Patient Details  Name: Mary Hart MRN: [id number] Date of Birth: 03/20/1941  Today's Date: 03/14/2014 Time: 1430-1510 PT Time Calculation (min): 40 min              Visit#: 8 of 8  Re-eval: 03/14/14 Authorization: medicare    Authorization Visit#: 8 of 10  Charges:  Self care 30'  Subjective Symptoms/Limitations Symptoms: Pt just returning from [place] and states she's feeling great.  Pt states she walked for 8 hours one day at [facility] and was able to sit over 2 hours without difficulty.  Pt with plans to return to [facility] and is currently without pain.  Pain Assessment Currently in Pain?: No/denies  Objective: Sensation/Coordination/Flexibility/Functional Tests Functional Tests Functional Tests: foto 72% (was 49%)  Assessment (measures taken on 03/04/14) RLE AROM (degrees) Right Knee Extension: 0 Right Knee Flexion: 130 RLE Strength Right Hip Flexion: 5/5 Right Hip Extension: 5/5 Right Hip ABduction: 5/5 Right Knee Flexion:  (4+/5) Right Knee Extension: 5/5 Right Ankle Dorsiflexion: 5/5  Exercise/Treatments Aerobic Stationary Bike: nustep level 4 hills 3  x 8'   Physical Therapy Assessment and Plan PT Assessment and Plan Clinical Impression Statement: Pt has completed 8 PT visits following Rt TKR.  Was was evaluated last week prior to her departure for [place].  Discussed at that time continuing therapy additional 3 weeks.  When patient returned discussed overall functional improvement and readiness to continue independently.  Pt has now met all goals and intends on joining local gym.  ROM and strength is now WNL and no longer with pain or impairments.  Pt given literature on local gymn.  discussed with evaluating therapist and in agreement with plan.   PT Plan: Recommend discharge to [facility].  Evaluating therapist and patient in agreement.      Goals  Home Exercise Program  Pt/caregiver will Perform Home Exercise  Program: For increased strengthening  PT Short Term Goals  Time to Complete Short Term Goals: 2 weeks  PT Short Term Goal 1: Pt ROM to be 0 to allow normalized walking  PT Short Term Goal 1 - Progress: Met  PT Short Term Goal 2: Pt to be walking inside without a cane  PT Short Term Goal 2 - Progress: Met  PT Short Term Goal 3: Pt to be able to sit for an hour without feeling stiffness  PT Short Term Goal 3 - Progress: Progressing toward goal  PT Short Term Goal 4: Pt to be able to stand for 20 minutes to make a quick meal  PT Short Term Goal 4 - Progress: Met  PT Short Term Goal 5: Pt to be able to walk for 30 minutes for better health habits.  PT Short Term Goal 5 - Progress: Progressing toward goal   PT Long Term Goals  Time to Complete Long Term Goals: 4 weeks  PT Long Term Goal 1: Pt to be walking inside and outside without an assistive device  PT Long Term Goal 1 - Progress: Met  PT Long Term Goal 2: Pt to be able to sit for two hours with comfort for traveling  PT Long Term Goal 2 - Progress: Met Long Term Goal 3: Pt to be able to walk for 40 minutes for better health habits.  Long Term Goal 3 Progress: Met    Problem List Patient Active Problem List   Diagnosis Date Noted  . Leg weakness 02/12/2014  . Stiffness of joint, not elsewhere  classified, lower leg 02/12/2014  . Difficulty in walking(719.7) 02/12/2014  . Recurrent colitis due to Clostridium difficile 07/03/2013  . LLQ pain 10/04/2012  . Constipation 05/04/2012  . Irritable bowel syndrome 05/04/2012  . GERD (gastroesophageal reflux disease) 02/14/2012  . Dysphagia 02/14/2012  . NAUSEA 11/18/2010  . ABDOMINAL PAIN-EPIGASTRIC 11/18/2010    PT - End of Session Activity Tolerance: Patient tolerated treatment well General Behavior During Therapy: WFL for tasks assessed/performed PT Plan of Care Consulted and Agree with Plan of Care: Patient  GP Functional Assessment Tool Used: foto Functional Limitation:  Mobility: Walking and moving around Mobility: Walking and Moving Around Goal Status [phone]): At least 20 percent but less than 40 percent impaired, limited or restricted Mobility: Walking and Moving Around Discharge Status [phone]): At least 1 percent but less than 20 percent impaired, limited or restricted  Teena Irani, PTA/CLT 03/14/2014, 3:45 PM

## 2014-03-19 ENCOUNTER — Ambulatory Visit (HOSPITAL_COMMUNITY): Payer: Medicare Other | Admitting: Physical Therapy

## 2014-03-19 ENCOUNTER — Ambulatory Visit: Payer: Medicare Other | Admitting: Internal Medicine

## 2014-03-19 DIAGNOSIS — M47814 Spondylosis without myelopathy or radiculopathy, thoracic region: Secondary | ICD-10-CM | POA: Diagnosis not present

## 2014-03-19 DIAGNOSIS — M999 Biomechanical lesion, unspecified: Secondary | ICD-10-CM | POA: Diagnosis not present

## 2014-03-19 DIAGNOSIS — M47817 Spondylosis without myelopathy or radiculopathy, lumbosacral region: Secondary | ICD-10-CM | POA: Diagnosis not present

## 2014-03-19 DIAGNOSIS — M5030 Other cervical disc degeneration, unspecified cervical region: Secondary | ICD-10-CM | POA: Diagnosis not present

## 2014-03-19 DIAGNOSIS — M4712 Other spondylosis with myelopathy, cervical region: Secondary | ICD-10-CM | POA: Diagnosis not present

## 2014-03-19 DIAGNOSIS — M9981 Other biomechanical lesions of cervical region: Secondary | ICD-10-CM | POA: Diagnosis not present

## 2014-03-19 DIAGNOSIS — M531 Cervicobrachial syndrome: Secondary | ICD-10-CM | POA: Diagnosis not present

## 2014-03-21 ENCOUNTER — Ambulatory Visit (HOSPITAL_COMMUNITY): Payer: Medicare Other | Admitting: Physical Therapy

## 2014-03-21 DIAGNOSIS — M999 Biomechanical lesion, unspecified: Secondary | ICD-10-CM | POA: Diagnosis not present

## 2014-03-21 DIAGNOSIS — M47817 Spondylosis without myelopathy or radiculopathy, lumbosacral region: Secondary | ICD-10-CM | POA: Diagnosis not present

## 2014-03-21 DIAGNOSIS — M531 Cervicobrachial syndrome: Secondary | ICD-10-CM | POA: Diagnosis not present

## 2014-03-21 DIAGNOSIS — M47814 Spondylosis without myelopathy or radiculopathy, thoracic region: Secondary | ICD-10-CM | POA: Diagnosis not present

## 2014-03-21 DIAGNOSIS — M4712 Other spondylosis with myelopathy, cervical region: Secondary | ICD-10-CM | POA: Diagnosis not present

## 2014-03-21 DIAGNOSIS — M9981 Other biomechanical lesions of cervical region: Secondary | ICD-10-CM | POA: Diagnosis not present

## 2014-03-21 DIAGNOSIS — M5030 Other cervical disc degeneration, unspecified cervical region: Secondary | ICD-10-CM | POA: Diagnosis not present

## 2014-03-22 DIAGNOSIS — IMO0002 Reserved for concepts with insufficient information to code with codable children: Secondary | ICD-10-CM | POA: Diagnosis not present

## 2014-03-22 DIAGNOSIS — F438 Other reactions to severe stress: Secondary | ICD-10-CM | POA: Diagnosis not present

## 2014-03-22 DIAGNOSIS — F4389 Other reactions to severe stress: Secondary | ICD-10-CM | POA: Diagnosis not present

## 2014-03-22 DIAGNOSIS — E039 Hypothyroidism, unspecified: Secondary | ICD-10-CM | POA: Diagnosis not present

## 2014-03-25 ENCOUNTER — Ambulatory Visit (HOSPITAL_COMMUNITY): Payer: Medicare Other | Admitting: Physical Therapy

## 2014-03-25 DIAGNOSIS — M531 Cervicobrachial syndrome: Secondary | ICD-10-CM | POA: Diagnosis not present

## 2014-03-25 DIAGNOSIS — M47817 Spondylosis without myelopathy or radiculopathy, lumbosacral region: Secondary | ICD-10-CM | POA: Diagnosis not present

## 2014-03-25 DIAGNOSIS — M9981 Other biomechanical lesions of cervical region: Secondary | ICD-10-CM | POA: Diagnosis not present

## 2014-03-25 DIAGNOSIS — M47814 Spondylosis without myelopathy or radiculopathy, thoracic region: Secondary | ICD-10-CM | POA: Diagnosis not present

## 2014-03-25 DIAGNOSIS — M5030 Other cervical disc degeneration, unspecified cervical region: Secondary | ICD-10-CM | POA: Diagnosis not present

## 2014-03-25 DIAGNOSIS — M999 Biomechanical lesion, unspecified: Secondary | ICD-10-CM | POA: Diagnosis not present

## 2014-03-25 DIAGNOSIS — M4712 Other spondylosis with myelopathy, cervical region: Secondary | ICD-10-CM | POA: Diagnosis not present

## 2014-03-26 ENCOUNTER — Ambulatory Visit (INDEPENDENT_AMBULATORY_CARE_PROVIDER_SITE_OTHER): Payer: Medicare Other | Admitting: Internal Medicine

## 2014-03-26 ENCOUNTER — Encounter: Payer: Self-pay | Admitting: Internal Medicine

## 2014-03-26 VITALS — BP 112/58 | HR 80 | Ht 62.25 in | Wt 132.1 lb

## 2014-03-26 DIAGNOSIS — A0471 Enterocolitis due to Clostridium difficile, recurrent: Secondary | ICD-10-CM

## 2014-03-26 DIAGNOSIS — A0472 Enterocolitis due to Clostridium difficile, not specified as recurrent: Secondary | ICD-10-CM | POA: Diagnosis not present

## 2014-03-26 DIAGNOSIS — K219 Gastro-esophageal reflux disease without esophagitis: Secondary | ICD-10-CM | POA: Diagnosis not present

## 2014-03-26 MED ORDER — ALUM HYDROXIDE-MAG CARBONATE 95-358 MG/15ML PO SUSP: 30.0000 mL | ORAL | Status: DC | PRN

## 2014-03-26 NOTE — Assessment & Plan Note (Signed)
Stay off PPI for now and use Gaviscon prn

## 2014-03-26 NOTE — Assessment & Plan Note (Signed)
Resolved presently Call if recurrent problems and restart vancomycin, plan for FMT if this happens in the near future. I would have her take vancomycin 125 mg qid if she needs abx in future

## 2014-03-26 NOTE — Progress Notes (Signed)
         Subjective:    Patient ID: Mary Hart, female    DOB: 08/07/1941, 73 y.o.   MRN: [id number]  HPI Here for f/u of recurrent C diff and GER - no diarrhea x 2 weeks. GERD ok on Gaviscon 2 x a day. Allergies  Allergen Reactions  . Codeine   . Hydromorphone Hcl   . Morphine   . Moxifloxacin     N/V/D, skin red. Tolerates Levaquin and Cipro.   . Sulfonamide Derivatives    Outpatient Prescriptions Prior to Visit  Medication Sig Dispense Refill  . ALPRAZolam (XANAX) 0.25 MG tablet Take 1 tablet by mouth 3 (three) times daily as needed.      . Calcium Carbonate-Vitamin D (CALCIUM 600/VITAMIN D PO) Take 1 tablet by mouth 2 (two) times daily. OTC medication, the Vit D is 800 IU      . levothyroxine (SYNTHROID, LEVOTHROID) 25 MCG tablet Take 25 mcg by mouth daily.        . magnesium gluconate (MAGONATE) 500 MG tablet Take 500 mg by mouth 2 (two) times daily.      . promethazine (PHENERGAN) 25 MG tablet Take 25 mg by mouth every 6 (six) hours as needed for nausea or vomiting.      . saccharomyces boulardii (FLORASTOR) 250 MG capsule Take 250 mg by mouth daily.      . Simethicone (GAS-X PO) Take by mouth as needed.      Marland Kitchen acyclovir (ZOVIRAX) 400 MG tablet Take 400 mg by mouth daily.       . vancomycin (VANCOCIN) 125 MG capsule Take 125 mg by mouth as directed.       No facility-administered medications prior to visit.   Past Medical History  Diagnosis Date  . GERD (gastroesophageal reflux disease)   . Hypothyroid   . Arthritis   . Neuropathy   . Depression   . IBS (irritable bowel syndrome)   . Clostridium difficile infection     treated with vancomycin  . Internal hemorrhoids   . Diverticula of colon     minimal   Past Surgical History  Procedure Laterality Date  . Colonoscopy  06/20/2008    [place] Dr. Wynelle Bourgeois external hemorrhoids  . Anterior cervical fusion x 2    . Joint replacement  09/2010    Thumb  . Vesicovaginal fistula closure w/ tah    . Cholecystectomy     . Esophagogastroduodenoscopy  11/25/10    Rourk- tubular esophagus s/p of 56 dilator from a small HH/otherwise normal  . Abdominal hysterectomy    . Colonoscopy  10/12/2012    Dr. Gala Romney- internal hemorrhoids, very early, few, sigmoid diverticula o/w normal appearing colon and terminal ileum. + cdiff on stool samples taken. Segmental bx negative.    Review of Systems Started citalopram for grief/anhedonia - husband died 12-25-2013, she has been ill frequently    Objective:   Physical Exam NAD    Assessment & Plan:  Recurrent colitis due to Clostridium difficile Resolved presently Call if recurrent problems and restart vancomycin, plan for FMT if this happens in the near future. I would have her take vancomycin 125 mg qid if she needs abx in future  GERD (gastroesophageal reflux disease) Stay off PPI for now and use Gaviscon prn   MV:EHMCN,OBSJGGEZ J., MD Carlyle Basques, MD, Bridgette Habermann, MD

## 2014-03-26 NOTE — Patient Instructions (Addendum)
You have been given a separate informational sheet regarding your tobacco use, the importance of quitting and local resources to help you quit.  I am glad you are better. Remember to call me if the diarrhea returns. Also remember - if you need antibiotics then you most likely need to take vancomycin.  Stay on Florastor for at least 1 month, two if you can afford it.  I appreciate the opportunity to care for you.  Gatha Mayer, MD, Marval Regal

## 2014-03-27 ENCOUNTER — Ambulatory Visit (HOSPITAL_COMMUNITY): Payer: Medicare Other | Admitting: Physical Therapy

## 2014-03-28 DIAGNOSIS — M999 Biomechanical lesion, unspecified: Secondary | ICD-10-CM | POA: Diagnosis not present

## 2014-03-28 DIAGNOSIS — M4712 Other spondylosis with myelopathy, cervical region: Secondary | ICD-10-CM | POA: Diagnosis not present

## 2014-03-28 DIAGNOSIS — M47817 Spondylosis without myelopathy or radiculopathy, lumbosacral region: Secondary | ICD-10-CM | POA: Diagnosis not present

## 2014-03-28 DIAGNOSIS — M47814 Spondylosis without myelopathy or radiculopathy, thoracic region: Secondary | ICD-10-CM | POA: Diagnosis not present

## 2014-03-28 DIAGNOSIS — M531 Cervicobrachial syndrome: Secondary | ICD-10-CM | POA: Diagnosis not present

## 2014-03-28 DIAGNOSIS — M9981 Other biomechanical lesions of cervical region: Secondary | ICD-10-CM | POA: Diagnosis not present

## 2014-03-28 DIAGNOSIS — M5030 Other cervical disc degeneration, unspecified cervical region: Secondary | ICD-10-CM | POA: Diagnosis not present

## 2014-04-02 DIAGNOSIS — M47817 Spondylosis without myelopathy or radiculopathy, lumbosacral region: Secondary | ICD-10-CM | POA: Diagnosis not present

## 2014-04-02 DIAGNOSIS — M5030 Other cervical disc degeneration, unspecified cervical region: Secondary | ICD-10-CM | POA: Diagnosis not present

## 2014-04-02 DIAGNOSIS — M47814 Spondylosis without myelopathy or radiculopathy, thoracic region: Secondary | ICD-10-CM | POA: Diagnosis not present

## 2014-04-02 DIAGNOSIS — M999 Biomechanical lesion, unspecified: Secondary | ICD-10-CM | POA: Diagnosis not present

## 2014-04-02 DIAGNOSIS — M9981 Other biomechanical lesions of cervical region: Secondary | ICD-10-CM | POA: Diagnosis not present

## 2014-04-02 DIAGNOSIS — M4712 Other spondylosis with myelopathy, cervical region: Secondary | ICD-10-CM | POA: Diagnosis not present

## 2014-04-02 DIAGNOSIS — M531 Cervicobrachial syndrome: Secondary | ICD-10-CM | POA: Diagnosis not present

## 2014-04-04 DIAGNOSIS — M531 Cervicobrachial syndrome: Secondary | ICD-10-CM | POA: Diagnosis not present

## 2014-04-04 DIAGNOSIS — M47817 Spondylosis without myelopathy or radiculopathy, lumbosacral region: Secondary | ICD-10-CM | POA: Diagnosis not present

## 2014-04-04 DIAGNOSIS — M47814 Spondylosis without myelopathy or radiculopathy, thoracic region: Secondary | ICD-10-CM | POA: Diagnosis not present

## 2014-04-04 DIAGNOSIS — M999 Biomechanical lesion, unspecified: Secondary | ICD-10-CM | POA: Diagnosis not present

## 2014-04-04 DIAGNOSIS — M4712 Other spondylosis with myelopathy, cervical region: Secondary | ICD-10-CM | POA: Diagnosis not present

## 2014-04-04 DIAGNOSIS — M9981 Other biomechanical lesions of cervical region: Secondary | ICD-10-CM | POA: Diagnosis not present

## 2014-04-04 DIAGNOSIS — M5030 Other cervical disc degeneration, unspecified cervical region: Secondary | ICD-10-CM | POA: Diagnosis not present

## 2014-04-09 DIAGNOSIS — M4712 Other spondylosis with myelopathy, cervical region: Secondary | ICD-10-CM | POA: Diagnosis not present

## 2014-04-09 DIAGNOSIS — M5030 Other cervical disc degeneration, unspecified cervical region: Secondary | ICD-10-CM | POA: Diagnosis not present

## 2014-04-09 DIAGNOSIS — M531 Cervicobrachial syndrome: Secondary | ICD-10-CM | POA: Diagnosis not present

## 2014-04-09 DIAGNOSIS — M999 Biomechanical lesion, unspecified: Secondary | ICD-10-CM | POA: Diagnosis not present

## 2014-04-09 DIAGNOSIS — M9981 Other biomechanical lesions of cervical region: Secondary | ICD-10-CM | POA: Diagnosis not present

## 2014-04-09 DIAGNOSIS — M47817 Spondylosis without myelopathy or radiculopathy, lumbosacral region: Secondary | ICD-10-CM | POA: Diagnosis not present

## 2014-04-09 DIAGNOSIS — M47814 Spondylosis without myelopathy or radiculopathy, thoracic region: Secondary | ICD-10-CM | POA: Diagnosis not present

## 2014-04-16 DIAGNOSIS — M47814 Spondylosis without myelopathy or radiculopathy, thoracic region: Secondary | ICD-10-CM | POA: Diagnosis not present

## 2014-04-16 DIAGNOSIS — M5030 Other cervical disc degeneration, unspecified cervical region: Secondary | ICD-10-CM | POA: Diagnosis not present

## 2014-04-16 DIAGNOSIS — M999 Biomechanical lesion, unspecified: Secondary | ICD-10-CM | POA: Diagnosis not present

## 2014-04-16 DIAGNOSIS — M4712 Other spondylosis with myelopathy, cervical region: Secondary | ICD-10-CM | POA: Diagnosis not present

## 2014-04-16 DIAGNOSIS — M9981 Other biomechanical lesions of cervical region: Secondary | ICD-10-CM | POA: Diagnosis not present

## 2014-04-16 DIAGNOSIS — M531 Cervicobrachial syndrome: Secondary | ICD-10-CM | POA: Diagnosis not present

## 2014-04-16 DIAGNOSIS — M47817 Spondylosis without myelopathy or radiculopathy, lumbosacral region: Secondary | ICD-10-CM | POA: Diagnosis not present

## 2014-04-29 DIAGNOSIS — M47817 Spondylosis without myelopathy or radiculopathy, lumbosacral region: Secondary | ICD-10-CM | POA: Diagnosis not present

## 2014-04-29 DIAGNOSIS — M531 Cervicobrachial syndrome: Secondary | ICD-10-CM | POA: Diagnosis not present

## 2014-04-29 DIAGNOSIS — M47814 Spondylosis without myelopathy or radiculopathy, thoracic region: Secondary | ICD-10-CM | POA: Diagnosis not present

## 2014-04-29 DIAGNOSIS — M999 Biomechanical lesion, unspecified: Secondary | ICD-10-CM | POA: Diagnosis not present

## 2014-04-29 DIAGNOSIS — M4712 Other spondylosis with myelopathy, cervical region: Secondary | ICD-10-CM | POA: Diagnosis not present

## 2014-04-29 DIAGNOSIS — M9981 Other biomechanical lesions of cervical region: Secondary | ICD-10-CM | POA: Diagnosis not present

## 2014-04-29 DIAGNOSIS — M5030 Other cervical disc degeneration, unspecified cervical region: Secondary | ICD-10-CM | POA: Diagnosis not present

## 2014-05-06 DIAGNOSIS — M47814 Spondylosis without myelopathy or radiculopathy, thoracic region: Secondary | ICD-10-CM | POA: Diagnosis not present

## 2014-05-06 DIAGNOSIS — M4712 Other spondylosis with myelopathy, cervical region: Secondary | ICD-10-CM | POA: Diagnosis not present

## 2014-05-06 DIAGNOSIS — M5030 Other cervical disc degeneration, unspecified cervical region: Secondary | ICD-10-CM | POA: Diagnosis not present

## 2014-05-06 DIAGNOSIS — M47817 Spondylosis without myelopathy or radiculopathy, lumbosacral region: Secondary | ICD-10-CM | POA: Diagnosis not present

## 2014-05-06 DIAGNOSIS — M531 Cervicobrachial syndrome: Secondary | ICD-10-CM | POA: Diagnosis not present

## 2014-05-06 DIAGNOSIS — M9981 Other biomechanical lesions of cervical region: Secondary | ICD-10-CM | POA: Diagnosis not present

## 2014-05-06 DIAGNOSIS — M999 Biomechanical lesion, unspecified: Secondary | ICD-10-CM | POA: Diagnosis not present

## 2014-05-20 DIAGNOSIS — M531 Cervicobrachial syndrome: Secondary | ICD-10-CM | POA: Diagnosis not present

## 2014-05-20 DIAGNOSIS — M4712 Other spondylosis with myelopathy, cervical region: Secondary | ICD-10-CM | POA: Diagnosis not present

## 2014-05-20 DIAGNOSIS — M5030 Other cervical disc degeneration, unspecified cervical region: Secondary | ICD-10-CM | POA: Diagnosis not present

## 2014-05-20 DIAGNOSIS — M47814 Spondylosis without myelopathy or radiculopathy, thoracic region: Secondary | ICD-10-CM | POA: Diagnosis not present

## 2014-05-20 DIAGNOSIS — M999 Biomechanical lesion, unspecified: Secondary | ICD-10-CM | POA: Diagnosis not present

## 2014-05-20 DIAGNOSIS — M47817 Spondylosis without myelopathy or radiculopathy, lumbosacral region: Secondary | ICD-10-CM | POA: Diagnosis not present

## 2014-05-20 DIAGNOSIS — M9981 Other biomechanical lesions of cervical region: Secondary | ICD-10-CM | POA: Diagnosis not present

## 2014-05-21 DIAGNOSIS — Z9889 Other specified postprocedural states: Secondary | ICD-10-CM | POA: Diagnosis not present

## 2014-05-31 ENCOUNTER — Emergency Department (HOSPITAL_COMMUNITY)
Admission: EM | Admit: 2014-05-31 | Discharge: 2014-05-31 | Disposition: A | Discharge status: Home / Self Care | Payer: Medicare Other | Attending: Emergency Medicine | Admitting: Emergency Medicine

## 2014-05-31 ENCOUNTER — Emergency Department (HOSPITAL_COMMUNITY): Payer: Medicare Other

## 2014-05-31 ENCOUNTER — Encounter (HOSPITAL_COMMUNITY): Payer: Self-pay | Admitting: Emergency Medicine

## 2014-05-31 DIAGNOSIS — S93409A Sprain of unspecified ligament of unspecified ankle, initial encounter: Secondary | ICD-10-CM | POA: Diagnosis not present

## 2014-05-31 DIAGNOSIS — R296 Repeated falls: Secondary | ICD-10-CM | POA: Insufficient documentation

## 2014-05-31 DIAGNOSIS — S99919A Unspecified injury of unspecified ankle, initial encounter: Secondary | ICD-10-CM | POA: Diagnosis not present

## 2014-05-31 DIAGNOSIS — S93402A Sprain of unspecified ligament of left ankle, initial encounter: Secondary | ICD-10-CM

## 2014-05-31 DIAGNOSIS — E039 Hypothyroidism, unspecified: Secondary | ICD-10-CM | POA: Diagnosis not present

## 2014-05-31 DIAGNOSIS — Y9389 Activity, other specified: Secondary | ICD-10-CM | POA: Insufficient documentation

## 2014-05-31 DIAGNOSIS — Z8739 Personal history of other diseases of the musculoskeletal system and connective tissue: Secondary | ICD-10-CM | POA: Insufficient documentation

## 2014-05-31 DIAGNOSIS — Z8619 Personal history of other infectious and parasitic diseases: Secondary | ICD-10-CM | POA: Diagnosis not present

## 2014-05-31 DIAGNOSIS — K219 Gastro-esophageal reflux disease without esophagitis: Secondary | ICD-10-CM | POA: Insufficient documentation

## 2014-05-31 DIAGNOSIS — M25579 Pain in unspecified ankle and joints of unspecified foot: Secondary | ICD-10-CM | POA: Diagnosis not present

## 2014-05-31 DIAGNOSIS — Z8669 Personal history of other diseases of the nervous system and sense organs: Secondary | ICD-10-CM | POA: Diagnosis not present

## 2014-05-31 DIAGNOSIS — X500XXA Overexertion from strenuous movement or load, initial encounter: Secondary | ICD-10-CM | POA: Insufficient documentation

## 2014-05-31 DIAGNOSIS — S8990XA Unspecified injury of unspecified lower leg, initial encounter: Secondary | ICD-10-CM | POA: Insufficient documentation

## 2014-05-31 DIAGNOSIS — Z79899 Other long term (current) drug therapy: Secondary | ICD-10-CM | POA: Diagnosis not present

## 2014-05-31 DIAGNOSIS — Y92009 Unspecified place in unspecified non-institutional (private) residence as the place of occurrence of the external cause: Secondary | ICD-10-CM | POA: Diagnosis not present

## 2014-05-31 DIAGNOSIS — F17200 Nicotine dependence, unspecified, uncomplicated: Secondary | ICD-10-CM | POA: Insufficient documentation

## 2014-05-31 DIAGNOSIS — F3289 Other specified depressive episodes: Secondary | ICD-10-CM | POA: Diagnosis not present

## 2014-05-31 DIAGNOSIS — F329 Major depressive disorder, single episode, unspecified: Secondary | ICD-10-CM | POA: Diagnosis not present

## 2014-05-31 DIAGNOSIS — Z8679 Personal history of other diseases of the circulatory system: Secondary | ICD-10-CM | POA: Insufficient documentation

## 2014-05-31 DIAGNOSIS — S99929A Unspecified injury of unspecified foot, initial encounter: Secondary | ICD-10-CM

## 2014-05-31 MED ORDER — MEPERIDINE HCL 50 MG PO TABS
25.0000 mg | ORAL_TABLET | Freq: Once | ORAL | Status: AC
  Administered 2014-05-31: 25 mg via ORAL
  Filled 2014-05-31: qty 1

## 2014-05-31 NOTE — ED Notes (Signed)
PT stated she fell about an hour ago and her left ankle rolled under her. On exam left ankle swollen with some bruising noted.

## 2014-05-31 NOTE — Discharge Instructions (Signed)
Take extra strenght tylenol for pain, elevate, apply ice and follow up with your orthopedic doctor. Return here as needed.

## 2014-05-31 NOTE — ED Notes (Signed)
NP at bedside.

## 2014-05-31 NOTE — ED Notes (Signed)
[place] this am at home.  Injury to left ankle.

## 2014-05-31 NOTE — ED Provider Notes (Signed)
CSN: [id number]     Arrival date & time 05/31/14  1306 History   First MD Initiated Contact with Patient 05/31/14 1335     Chief Complaint  Patient presents with  . Ankle Pain     (Consider location/radiation/quality/duration/timing/severity/associated sxs/prior Treatment) Patient is a 73 y.o. female presenting with ankle pain. The history is provided by the patient.  Ankle Pain Location:  Ankle Injury: yes   Mechanism of injury: fall   Fall:    Fall occurred:  Standing   Impact surface:  Chief Strategy Officer of impact:  Knees   Entrapped after fall: no   Ankle location:  L ankle Pain details:    Severity:  Moderate   Onset quality:  Sudden   Timing:  Constant   Progression:  Worsening Chronicity:  New Dislocation: no   Foreign body present:  No foreign bodies Prior injury to area:  No Mary Hart is a 73 y.o. female who presents to the ED with left ankle injury. She states she was getting up out of her recliner and her foot turned under and she fell to her knees. She complains of pain and swelling to the left ankle. She has an MRI scheduled for tomorrow for her knee due to knee pain and swelling s/p surgery. Followed by [place]. She uses a walker at home as needed.   Past Medical History  Diagnosis Date  . GERD (gastroesophageal reflux disease)   . Hypothyroid   . Arthritis   . Neuropathy   . Depression   . IBS (irritable bowel syndrome)   . Clostridium difficile infection     treated with vancomycin  . Internal hemorrhoids   . Diverticula of colon     minimal   Past Surgical History  Procedure Laterality Date  . Colonoscopy  06/20/2008    [place] Dr. Wynelle Bourgeois external hemorrhoids  . Anterior cervical fusion x 2    . Joint replacement  09/2010    Thumb  . Vesicovaginal fistula closure w/ tah    . Cholecystectomy    . Esophagogastroduodenoscopy  11/25/10    Rourk- tubular esophagus s/p of 56 dilator from a small HH/otherwise normal  . Abdominal  hysterectomy    . Colonoscopy  10/12/2012    Dr. Gala Romney- internal hemorrhoids, very early, few, sigmoid diverticula o/w normal appearing colon and terminal ileum. + cdiff on stool samples taken. Segmental bx negative.   Family History  Problem Relation Age of Onset  . Colon cancer Maternal Uncle   . Dementia Father   . Dementia Mother   . Breast cancer Mother    History  Substance Use Topics  . Smoking status: Current Every Day Smoker -- 0.50 packs/day for 50 years    Types: Cigarettes  . Smokeless tobacco: Never Used     Comment: 1/2 pack per day  . Alcohol Use: Yes   OB History   Grav Para Term Preterm Abortions TAB SAB Ect Mult Living                 Review of Systems Negative except as stated in HPI   Allergies  Codeine; Hydromorphone hcl; Morphine; Moxifloxacin; and Sulfonamide derivatives  Home Medications   Prior to Admission medications   Medication Sig Start Date End Date Taking? Authorizing Provider  ALPRAZolam Duanne Moron) 0.25 MG tablet Take 1 tablet by mouth 3 (three) times daily as needed. 08/09/12   Historical Provider, MD  aluminum hydroxide-magnesium carbonate (GAVISCON) 95-358 MG/15ML SUSP Take 30  mLs by mouth as needed. 03/26/14   Gatha Mayer, MD  Calcium Carbonate-Vitamin D (CALCIUM 600/VITAMIN D PO) Take 1 tablet by mouth 2 (two) times daily. OTC medication, the Vit D is 800 IU    Historical Provider, MD  cetirizine (ZYRTEC) 10 MG tablet Take 10 mg by mouth at bedtime.    Historical Provider, MD  citalopram (CELEXA) 20 MG tablet Take 20 mg by mouth daily.    Historical Provider, MD  levothyroxine (SYNTHROID, LEVOTHROID) 25 MCG tablet Take 25 mcg by mouth daily.      Historical Provider, MD  magnesium gluconate (MAGONATE) 500 MG tablet Take 500 mg by mouth 2 (two) times daily.    Historical Provider, MD  promethazine (PHENERGAN) 25 MG tablet Take 25 mg by mouth every 6 (six) hours as needed for nausea or vomiting.    Historical Provider, MD  saccharomyces  boulardii (FLORASTOR) 250 MG capsule Take 250 mg by mouth daily.    Historical Provider, MD  Simethicone (GAS-X PO) Take by mouth as needed.    Historical Provider, MD   BP 123/50  Pulse 70  Temp(Src) 98 F (36.7 C) (Oral)  Resp 18  Ht 5\' 3"  (1.6 m)  Wt 134 lb (60.782 kg)  BMI 23.74 kg/m2  SpO2 100% Physical Exam  Nursing note and vitals reviewed. Constitutional: She is oriented to person, place, and time. She appears well-developed and well-nourished.  HENT:  Head: Normocephalic.  Eyes: EOM are normal.  Neck: Neck supple.  Cardiovascular: Normal rate.   Pulmonary/Chest: Effort normal.  Abdominal: Soft. There is no tenderness.  Musculoskeletal:       Left ankle: She exhibits decreased range of motion (due to pain), swelling and ecchymosis. She exhibits no deformity, no laceration and normal pulse. Tenderness. Lateral malleolus tenderness found. Achilles tendon normal.  Right knee with swelling that is being followed by ortho.  Left ankle tender with range of motion and palpation. Adequate circulation, good strength and touch sensation. Pedal pulse strong. Pain with eversion and inversion.   Neurological: She is alert and oriented to person, place, and time. No cranial nerve deficit.  Skin: Skin is warm and dry.    ED Course  Procedures Dg Ankle Complete Left  05/31/2014   CLINICAL DATA:  [place], pain.  EXAM: LEFT ANKLE COMPLETE - 3+ VIEW  COMPARISON:  03/13/2012.  FINDINGS: Marked lateral soft tissue swelling. No transverse lateral malleolar fracture is evident. A lateral malleolar cleft was questioned on the previous study and appears unchanged. Ankle mortise intact. No medial or posterior malleolar abnormalities.  There is a small rounded density anteriorly at the tibiotalar articulation which has developed since 2013 and could represent old injury. Plantar spurs are noted.  IMPRESSION: Lateral and anterior soft tissue swelling. No definite acute fracture.   Electronically Signed    By: Rolla Flatten M.D.   On: 05/31/2014 13:45    MDM  73 y.o. female with left ankle pain, swelling and bruising s/p fall earlier today. Placed in [place]. Stable for discharge without neurovascular deficits. She will follow up with [facility]. She will return here as needed for problems. Discussed neurovascular checks and ice, elevation and using her walker with ambulation. She voices understanding and agrees with plan.     [place], [place] 05/31/14 [phone]

## 2014-06-01 DIAGNOSIS — M25569 Pain in unspecified knee: Secondary | ICD-10-CM | POA: Diagnosis not present

## 2014-06-01 NOTE — ED Provider Notes (Signed)
Medical screening examination/treatment/procedure(s) were performed by non-physician practitioner and as supervising physician I was immediately available for consultation/collaboration.   EKG Interpretation None        Sharyon Cable, MD 06/01/14 [phone]

## 2014-06-03 DIAGNOSIS — Z5189 Encounter for other specified aftercare: Secondary | ICD-10-CM | POA: Diagnosis not present

## 2014-06-03 DIAGNOSIS — M25579 Pain in unspecified ankle and joints of unspecified foot: Secondary | ICD-10-CM | POA: Diagnosis not present

## 2014-06-11 DIAGNOSIS — Z5189 Encounter for other specified aftercare: Secondary | ICD-10-CM | POA: Diagnosis not present

## 2014-06-20 DIAGNOSIS — S8290XD Unspecified fracture of unspecified lower leg, subsequent encounter for closed fracture with routine healing: Secondary | ICD-10-CM | POA: Diagnosis not present

## 2014-07-12 ENCOUNTER — Other Ambulatory Visit: Payer: Medicare Other

## 2014-07-12 ENCOUNTER — Telehealth: Payer: Self-pay

## 2014-07-12 DIAGNOSIS — R197 Diarrhea, unspecified: Secondary | ICD-10-CM

## 2014-07-12 DIAGNOSIS — S8290XD Unspecified fracture of unspecified lower leg, subsequent encounter for closed fracture with routine healing: Secondary | ICD-10-CM | POA: Diagnosis not present

## 2014-07-12 NOTE — Telephone Encounter (Signed)
Patient walked into the office to state she has multiple diarrhea stools and she feels she has c-diff.  Patient has a history of c-diff and almost required FMT.  Patient sent to the lab for c-diff PCR.  Dr. Carlean Purl hope this was ok( She was here ).  Please advise if needs any additional orders.

## 2014-07-15 ENCOUNTER — Other Ambulatory Visit: Payer: Medicare Other

## 2014-07-15 DIAGNOSIS — R197 Diarrhea, unspecified: Secondary | ICD-10-CM

## 2014-07-16 DIAGNOSIS — M1710 Unilateral primary osteoarthritis, unspecified knee: Secondary | ICD-10-CM | POA: Diagnosis not present

## 2014-07-16 LAB — CLOSTRIDIUM DIFFICILE BY PCR: Toxigenic C. Difficile by PCR: NOT DETECTED

## 2014-07-17 NOTE — Progress Notes (Signed)
Quick Note:  She does not have C diff Please get a symptom update ______

## 2014-07-17 NOTE — Progress Notes (Signed)
Quick Note:  She has post-infectious IBS and needs to promote more regular BM to avoid backing up and unloading  Try 1/2-1 dose MiraLax daily  Lack of energy not a GI problem ______

## 2014-07-31 DIAGNOSIS — M1711 Unilateral primary osteoarthritis, right knee: Secondary | ICD-10-CM | POA: Diagnosis not present

## 2014-08-07 DIAGNOSIS — M1711 Unilateral primary osteoarthritis, right knee: Secondary | ICD-10-CM | POA: Diagnosis not present

## 2014-08-09 DIAGNOSIS — S8265XD Nondisplaced fracture of lateral malleolus of left fibula, subsequent encounter for closed fracture with routine healing: Secondary | ICD-10-CM | POA: Diagnosis not present

## 2014-08-14 DIAGNOSIS — M1711 Unilateral primary osteoarthritis, right knee: Secondary | ICD-10-CM | POA: Diagnosis not present

## 2014-08-26 ENCOUNTER — Ambulatory Visit (HOSPITAL_COMMUNITY)
Admission: RE | Admit: 2014-08-26 | Discharge: 2014-08-26 | Disposition: A | Discharge status: Home / Self Care | Payer: Medicare Other | Source: Ambulatory Visit | Attending: Sports Medicine | Admitting: Sports Medicine

## 2014-08-26 ENCOUNTER — Encounter (HOSPITAL_COMMUNITY): Payer: Self-pay | Admitting: Physical Therapy

## 2014-08-26 DIAGNOSIS — R262 Difficulty in walking, not elsewhere classified: Secondary | ICD-10-CM | POA: Diagnosis not present

## 2014-08-26 DIAGNOSIS — M25571 Pain in right ankle and joints of right foot: Secondary | ICD-10-CM | POA: Insufficient documentation

## 2014-08-26 DIAGNOSIS — Z5189 Encounter for other specified aftercare: Secondary | ICD-10-CM | POA: Insufficient documentation

## 2014-08-26 DIAGNOSIS — M6281 Muscle weakness (generalized): Secondary | ICD-10-CM | POA: Insufficient documentation

## 2014-08-26 DIAGNOSIS — M25572 Pain in left ankle and joints of left foot: Secondary | ICD-10-CM

## 2014-08-26 NOTE — Patient Instructions (Signed)
Eversion: Isometric   Press outer border of right foot into ball or rolled pillow against wall. Hold ____ seconds. Relax. Repeat ____ times per set. Do ____ sets per session. Do ____ sessions per day.  http://orth.exer.us/4   Copyright  VHI. All rights reserved.  Eversion: Resisted   With right foot in tubing loop, hold tubing around other foot to resist and turn foot out. Repeat ____ times per set. Do ____ sets per session. Do ____ sessions per day.  http://orth.exer.us/14   Copyright  VHI. All rights reserved.  Balance: Unilateral - Foam   Eyes open, balance with right leg on dense foam. Hold ____ seconds. Repeat ____ times per set. Do ____ sets per session. Do ____ sessions per day. Perform exercise with eyes closed.  http://orth.exer.us/50   Copyright  VHI. All rights reserved.

## 2014-08-27 NOTE — Therapy (Addendum)
Physical Therapy Evaluation  Patient Details  Name: Mary Hart MRN: [id number] Date of Birth: Aug 06, 1941  Encounter Date: 08/26/2014      PT End of Session - 09/02/14 1339    Visit Number 1   Number of Visits 9   Date for PT Re-Evaluation 09/16/14   Authorization Type medicare    Authorization - Visit Number 1   Authorization - Number of Visits 9   PT Start Time (ACUTE ONLY) 1305   PT Stop Time (ACUTE ONLY) 1348   PT Time Calculation (min) (ACUTE ONLY) 43 min   Activity Tolerance Patient tolerated treatment well      Past Medical History  Diagnosis Date  . GERD (gastroesophageal reflux disease)   . Hypothyroid   . Arthritis   . Neuropathy   . Depression   . IBS (irritable bowel syndrome)   . Clostridium difficile infection     treated with vancomycin  . Internal hemorrhoids   . Diverticula of colon     minimal    Past Surgical History  Procedure Laterality Date  . Colonoscopy  06/20/2008    [place] Dr. Wynelle Bourgeois external hemorrhoids  . Anterior cervical fusion x 2    . Joint replacement  09/2010    Thumb  . Vesicovaginal fistula closure w/ tah    . Cholecystectomy    . Esophagogastroduodenoscopy  11/25/10    Rourk- tubular esophagus s/p of 56 dilator from a small HH/otherwise normal  . Abdominal hysterectomy    . Colonoscopy  10/12/2012    Dr. Gala Romney- internal hemorrhoids, very early, few, sigmoid diverticula o/w normal appearing colon and terminal ileum. + cdiff on stool samples taken. Segmental bx negative.    There were no vitals taken for this visit.  Visit Diagnosis:  Pain in joint, ankle and foot, left     08/26/14 1353  Symptoms/Limitations  Symptoms Pt states that she sprained her ankle very badly about four years ago and then she broke it three years ago she was placed in a cam boot for several weeks.  The MD states that the fracture looks well but she continues to have pain therefore she has been referred to PT.   How long can you sit  comfortably? no problem   How long can you stand comfortably? 15  How long can you walk comfortably? 15 minutes   Pain Assessment  Currently in Pain? Yes  Pain Location Ankle 6  Pain Orientation Left;Lateral  Pain Descriptors / Indicators Aching;Sharp  Pain Onset Other (comment)  Pain Frequency Intermittent  Aggravating Factors  walking  Pain Relieving Factors icing   Effect of Pain on Daily Activities increases pain    08/26/14 1300  Assessment  Medical Diagnosis Lt ankle pain   Onset Date 06/03/14  Next MD Visit 09/13/2014  Prior Therapy none  Precautions  Precautions Fall  Balance Screen  Has the patient fallen in the past 6 months Yes  How many times? 1  Has the patient had a decrease in activity level because of a fear of falling?  No  Is the patient reluctant to leave their home because of a fear of falling?  No  Prior Function  Vocation Retired  Leisure praise sing, goes to gym   Observation/Other Assessments  Focus on Therapeutic Outcomes (FOTO)  52  AROM  AROM Assessment Site Ankle  Right/Left Ankle Left  Left Ankle Dorsiflexion 10  Left Ankle Plantar Flexion 65  Left Ankle Inversion 30  Left Ankle Eversion 20  Strength  Overall Strength Deficits  Overall Strength Comments eversion 3/5; plantarflexion 4/5   Balance  Balance Assessed Yes  Static Standing Balance  Static Standing - Balance Support No upper extremity supported  Static Standing - Level of Assistance 7: Independent  Static Standing - Comment/# of Minutes Rt 15 seconds;Lt 20 seconds     STG:               1:  I in HEP -5 days 2 weeks          2: Pain no greater than a 3/10 80% of the day                        3:  No falls in the past 2 weeks  LTG:  4 weeks         1:  I in advance HEP                        2:  Pain no greater than a 1/0                        3) Pt to be able to praise sing without increased pain                        4)  Pt to return to the gym                         5) Pt strength wnl            Plan - 09/02/14 1341    Clinical Impression Statement PT is a 73 yo female with increased ankle pain.  She will benefit from skilled PT to address her deficits of decreased strength of evertors and decreased balance as well as chronic inflammation to maximize her functional ability and improve her quality of life   PT Plan Begin Baps, sidelying eversion as well as iontophoresis next treatment.         Problem List Patient Active Problem List   Diagnosis Date Noted  . Leg weakness 02/12/2014  . Stiffness of joint, not elsewhere classified, lower leg 02/12/2014  . Difficulty in walking(719.7) 02/12/2014  . Recurrent colitis due to Clostridium difficile 07/03/2013  . LLQ pain 10/04/2012  . Irritable bowel syndrome 05/04/2012  . GERD (gastroesophageal reflux disease) 02/14/2012                                            Yousuf Ager,CINDY 09/03/2014, 3:43 PM

## 2014-08-28 ENCOUNTER — Ambulatory Visit (HOSPITAL_COMMUNITY)
Admission: RE | Admit: 2014-08-28 | Discharge: 2014-08-28 | Disposition: A | Discharge status: Home / Self Care | Payer: Medicare Other | Source: Ambulatory Visit | Attending: Sports Medicine | Admitting: Sports Medicine

## 2014-08-28 DIAGNOSIS — M25572 Pain in left ankle and joints of left foot: Secondary | ICD-10-CM

## 2014-08-28 DIAGNOSIS — Z5189 Encounter for other specified aftercare: Secondary | ICD-10-CM | POA: Diagnosis not present

## 2014-08-28 DIAGNOSIS — R29898 Other symptoms and signs involving the musculoskeletal system: Secondary | ICD-10-CM

## 2014-08-28 NOTE — Therapy (Addendum)
Physical Therapy Treatment  Patient Details  Name: Mary Hart MRN: [id number] Date of Birth: 09/30/1941  Encounter Date: 08/28/2014      PT End of Session - 08/28/14 1602    Visit Number 2   Number of Visits 9   Date for PT Re-Evaluation 09/16/14   Authorization Type medicare    Authorization - Visit Number 2   Authorization - Number of Visits 9   PT Start Time 9675   PT Stop Time 9163   PT Time Calculation (min) 48 min   PT Charge Details TE [id number]; [place] [id number]   Activity Tolerance Patient tolerated treatment well      Past Medical History  Diagnosis Date  . GERD (gastroesophageal reflux disease)   . Hypothyroid   . Arthritis   . Neuropathy   . Depression   . IBS (irritable bowel syndrome)   . Clostridium difficile infection     treated with vancomycin  . Internal hemorrhoids   . Diverticula of colon     minimal    Past Surgical History  Procedure Laterality Date  . Colonoscopy  06/20/2008    [place] Dr. Wynelle Bourgeois external hemorrhoids  . Anterior cervical fusion x 2    . Joint replacement  09/2010    Thumb  . Vesicovaginal fistula closure w/ tah    . Cholecystectomy    . Esophagogastroduodenoscopy  11/25/10    Rourk- tubular esophagus s/p of 56 dilator from a small HH/otherwise normal  . Abdominal hysterectomy    . Colonoscopy  10/12/2012    Dr. Gala Romney- internal hemorrhoids, very early, few, sigmoid diverticula o/w normal appearing colon and terminal ileum. + cdiff on stool samples taken. Segmental bx negative.    There were no vitals taken for this visit.  Visit Diagnosis:  Pain in joint, ankle and foot, left  Weakness of left lower extremity   Subjective: Pt stated her Rt knee has began to bother her, Lt ankle was sore yesterday current pain scale 2/10 currently.   Pain scale 2/10: Lt anterior medial ankle       Adult PT Treatment/Exercise - 08/28/14 0700    Modalities   Modalities Iontophoresis   Iontophoresis   Type of Iontophoresis  Dexamethasone   Location Lt lateral ankle posterior malleous   Time 10 minutes   Ankle Exercises: Standing   BAPS Standing;Level 3;10 reps;Limitations   BAPS Limitations Dorsi/Plantar flexion; Inv/Env 10x; CW/CCW 5reps each   SLS Lt 32" Rt 35" max of 3  Solid surface this session   Heel Raises 15 reps;3 seconds   Toe Raise 15 reps;3 seconds   Ankle Exercises: Seated   Towel Inversion/Eversion Limitations   Towel Inversion/Eversion Limitations 10 reps with green tband; isonmetric eversion against pillow   Ankle Exercises: Sidelying   Ankle Eversion Left;10 reps          Education - 08/28/14 1527    Education provided Yes   Education Details Patient   Methods Explanation;Demonstration   Comprehension Verbalized understanding;Returned demonstration          PT Short Term Goals - 08/28/14 1559    PT SHORT TERM GOAL #1   Title Pt to be I in HEP   Status On-going   PT SHORT TERM GOAL #2   Title Pain no greater than a 3/10   PT SHORT TERM GOAL #3   Title Pt to have not fallen in 2 weeks    Status On-going  PT Long Term Goals - 08/28/14 1601    PT LONG TERM GOAL #1   Title I in advance HEP   PT LONG TERM GOAL #2   Title Pain no greater than a 1/10 80% of the day   PT LONG TERM GOAL #3   Title Pt to be able to praise sing without increased pain   PT LONG TERM GOAL #4   Title Pt to return to gym   PT LONG TERM GOAL #5   Title Pt strength wnl    Status On-going          Plan - 08/28/14 1605    Clinical Impression Statement Reviewed HEP and gave pt HEP to begin at home, pt able to demosntrate appropriate technique with min cueing to reduce hip ER compensation wtih eversion exercise.  PT session focus on improving ankle musculature strengthening with exercises and BAPS.  Decreased coordinatoni noted with BAPS, cueing to reduce compensation with body movements to increase focus on ankle ROM movements.  Ended session with iontophoresis, pt educated on puspose  and wearing time.   PT Plan Continue with current PT POC for ankle strengthening and pain releif.  Continue with standing BAPS and iontophoresis next session.        Problem List Patient Active Problem List   Diagnosis Date Noted  . Leg weakness 02/12/2014  . Stiffness of joint, not elsewhere classified, lower leg 02/12/2014  . Difficulty in walking(719.7) 02/12/2014  . Recurrent colitis due to Clostridium difficile 07/03/2013  . LLQ pain 10/04/2012  . Irritable bowel syndrome 05/04/2012  . GERD (gastroesophageal reflux disease) 02/14/2012   Aldona Lento, PTA Aldona Lento 08/28/2014, 4:10 PM

## 2014-08-30 ENCOUNTER — Other Ambulatory Visit (HOSPITAL_COMMUNITY): Payer: Self-pay | Admitting: Family Medicine

## 2014-08-30 DIAGNOSIS — F4320 Adjustment disorder, unspecified: Secondary | ICD-10-CM | POA: Diagnosis not present

## 2014-08-30 DIAGNOSIS — Z6825 Body mass index (BMI) 25.0-25.9, adult: Secondary | ICD-10-CM | POA: Diagnosis not present

## 2014-08-30 DIAGNOSIS — Z1231 Encounter for screening mammogram for malignant neoplasm of breast: Secondary | ICD-10-CM

## 2014-08-30 DIAGNOSIS — Z23 Encounter for immunization: Secondary | ICD-10-CM | POA: Diagnosis not present

## 2014-09-02 ENCOUNTER — Ambulatory Visit (HOSPITAL_COMMUNITY)
Admission: RE | Admit: 2014-09-02 | Discharge: 2014-09-02 | Disposition: A | Discharge status: Home / Self Care | Payer: Medicare Other | Source: Ambulatory Visit | Attending: Sports Medicine | Admitting: Sports Medicine

## 2014-09-02 ENCOUNTER — Ambulatory Visit (HOSPITAL_COMMUNITY): Payer: Medicare Other | Admitting: Physical Therapy

## 2014-09-02 ENCOUNTER — Encounter (HOSPITAL_COMMUNITY): Payer: Medicare Other | Admitting: Physical Therapy

## 2014-09-02 DIAGNOSIS — M25572 Pain in left ankle and joints of left foot: Secondary | ICD-10-CM

## 2014-09-02 DIAGNOSIS — R29898 Other symptoms and signs involving the musculoskeletal system: Secondary | ICD-10-CM

## 2014-09-02 DIAGNOSIS — Z5189 Encounter for other specified aftercare: Secondary | ICD-10-CM | POA: Diagnosis not present

## 2014-09-02 NOTE — Therapy (Signed)
Physical Therapy Treatment  Patient Details  Name: Mary Hart MRN: [id number] Date of Birth: [birth year]  Encounter Date: 09/02/2014      PT End of Session - 09/02/14 1339    Visit Number 3   Number of Visits 9   Date for PT Re-Evaluation 09/16/14   Authorization Type medicare    Authorization - Visit Number 3   Authorization - Number of Visits 9   PT Start Time 3474   PT Stop Time 1348   PT Time Calculation (min) 43 min   Activity Tolerance Patient tolerated treatment well      Past Medical History  Diagnosis Date  . GERD (gastroesophageal reflux disease)   . Hypothyroid   . Arthritis   . Neuropathy   . Depression   . IBS (irritable bowel syndrome)   . Clostridium difficile infection     treated with vancomycin  . Internal hemorrhoids   . Diverticula of colon     minimal    Past Surgical History  Procedure Laterality Date  . Colonoscopy  06/20/2008    [place] Dr. Wynelle Bourgeois external hemorrhoids  . Anterior cervical fusion x 2    . Joint replacement  09/2010    Thumb  . Vesicovaginal fistula closure w/ tah    . Cholecystectomy    . Esophagogastroduodenoscopy  11/25/10    Rourk- tubular esophagus s/p of 56 dilator from a small HH/otherwise normal  . Abdominal hysterectomy    . Colonoscopy  10/12/2012    Dr. Gala Romney- internal hemorrhoids, very early, few, sigmoid diverticula o/w normal appearing colon and terminal ileum. + cdiff on stool samples taken. Segmental bx negative.    There were no vitals taken for this visit.  Visit Diagnosis:  No diagnosis found.          [facility] Adult PT Treatment/Exercise - 09/02/14 1314    Modalities   Modalities Iontophoresis   Iontophoresis   Type of Iontophoresis Dexamethasone   Location Lt lateral ankle posterior malleous   Time 15   Ankle Exercises: Standing   BAPS Standing;Level 3;10 reps;Limitations   BAPS Limitations Dorsi/Plantar flexion; Inv/Env,CW/CCW 10reps each   SLS bilateral 1 minute no UE   Heel Raises  15 reps;3 seconds  no UE's   Toe Raise 15 reps;3 seconds  no UE's   Ankle Exercises: Seated   Other Seated Ankle Exercises theraband Inv/ev, DF blue band 10 reps each                Plan - 09/02/14 1341    Clinical Impression Statement Pt overall progressing well.  Pt able to complete 1 minute SLS on bilateral LE's without use of UE's.  Pt also with less therapist facilitation with BAPS, with CW being harder direction to achieve.  Continued to focus on improving ankle musculature and stability.  Ended session with iontophoresis, reminded patient to keep pad on for additional 2-3 hours.  Pt verbalized understanding.     PT Plan Continue with current PT POC for ankle strengthening and pain releif.  Add SLS on foam next visit.  Continue with iontophoresis.        Problem List Patient Active Problem List   Diagnosis Date Noted  . Leg weakness 02/12/2014  . Stiffness of joint, not elsewhere classified, lower leg 02/12/2014  . Difficulty in walking(719.7) 02/12/2014  . Recurrent colitis due to Clostridium difficile 07/03/2013  . LLQ pain 10/04/2012  . Irritable bowel syndrome 05/04/2012  . GERD (gastroesophageal reflux disease) 02/14/2012  Teena Irani, PTA/CLT 09/02/2014, 1:46 PM

## 2014-09-03 NOTE — Addendum Note (Signed)
Encounter addended by: Leeroy Cha, PT on: 09/03/2014  3:43 PM<BR>     Documentation filed: Clinical Notes

## 2014-09-03 NOTE — Addendum Note (Signed)
Encounter addended by: Leeroy Cha, PT on: 09/03/2014  2:19 PM<BR>     Documentation filed: Clinical Notes

## 2014-09-04 ENCOUNTER — Ambulatory Visit (HOSPITAL_COMMUNITY): Payer: Medicare Other

## 2014-09-04 ENCOUNTER — Ambulatory Visit (HOSPITAL_COMMUNITY)
Admission: RE | Admit: 2014-09-04 | Discharge: 2014-09-04 | Disposition: A | Discharge status: Home / Self Care | Payer: Medicare Other | Source: Ambulatory Visit | Attending: Sports Medicine | Admitting: Sports Medicine

## 2014-09-04 DIAGNOSIS — R29898 Other symptoms and signs involving the musculoskeletal system: Secondary | ICD-10-CM

## 2014-09-04 DIAGNOSIS — Z5189 Encounter for other specified aftercare: Secondary | ICD-10-CM | POA: Diagnosis not present

## 2014-09-04 DIAGNOSIS — M25572 Pain in left ankle and joints of left foot: Secondary | ICD-10-CM

## 2014-09-04 NOTE — Addendum Note (Signed)
Encounter addended by: Leeroy Cha, PT on: 09/04/2014  1:37 PM<BR>     Documentation filed: Flowsheet VN, Clinical Notes

## 2014-09-04 NOTE — Therapy (Signed)
Physical Therapy Treatment  Patient Details  Name: Mary Hart MRN: [id number] Date of Birth: 26-Apr-1941  Encounter Date: 09/04/2014      PT End of Session - 09/04/14 1609    Visit Number 4   Number of Visits 9   Date for PT Re-Evaluation 09/16/14   Authorization Type medicare    Authorization - Visit Number 4   Authorization - Number of Visits 9   PT Start Time 1522   PT Stop Time 1612   PT Time Calculation (min) 50 min   PT Charge Details TE [phone], [place] [id number]   Activity Tolerance Patient tolerated treatment well   Behavior During Therapy [facility] for tasks assessed/performed      Past Medical History  Diagnosis Date  . GERD (gastroesophageal reflux disease)   . Hypothyroid   . Arthritis   . Neuropathy   . Depression   . IBS (irritable bowel syndrome)   . Clostridium difficile infection     treated with vancomycin  . Internal hemorrhoids   . Diverticula of colon     minimal    Past Surgical History  Procedure Laterality Date  . Colonoscopy  06/20/2008    [place] Dr. Wynelle Bourgeois external hemorrhoids  . Anterior cervical fusion x 2    . Joint replacement  09/2010    Thumb  . Vesicovaginal fistula closure w/ tah    . Cholecystectomy    . Esophagogastroduodenoscopy  11/25/10    Rourk- tubular esophagus s/p of 56 dilator from a small HH/otherwise normal  . Abdominal hysterectomy    . Colonoscopy  10/12/2012    Dr. Gala Romney- internal hemorrhoids, very early, few, sigmoid diverticula o/w normal appearing colon and terminal ileum. + cdiff on stool samples taken. Segmental bx negative.    There were no vitals taken for this visit.  Visit Diagnosis:  Pain in joint, ankle and foot, left  Weakness of left lower extremity      Subjective Assessment - 09/04/14 1528    Symptoms Pain scale Lt ankle 2/10 anterior medial portion.  Lateral aspect of ankle feels a lot better following last session.  Pt with GI tract complaints today.     Currently in Pain? Yes   Pain  Score 2    Pain Location Ankle   Pain Orientation Left;Anterior;Medial            [facility] Adult PT Treatment/Exercise - 09/04/14 0001    Modalities   Modalities Iontophoresis   Iontophoresis   Type of Iontophoresis Dexamethasone   Location anterior medial aspect over anterior tibalis   Dose 67mA   Time 14   Ankle Exercises: Standing   BAPS Standing;Level 3;10 reps;Limitations   BAPS Limitations Dorsi/Plantar flexion; Inv/Env,CW/CCW 10reps each   SLS on airex Lt  56" Rt 60"+   Heel Raises 15 reps;3 seconds;Limitations  airex   Toe Raise 15 reps;3 seconds;Limitations  1 finger HHA on airex   Ankle Exercises: Seated   Other Seated Ankle Exercises 10 reps eash direciton blue tband for HEP   Ankle Exercises: Stretches   Slant Board Stretch 3 reps;30 seconds          PT Education - 09/04/14 1545    Education provided Yes   Education Details Pt given blue tband to progress strengthening with verbal instructions to assure correct technique with HEP   Person(s) Educated Patient   Methods Explanation;Demonstration   Comprehension Verbalized understanding;Returned demonstration          PT Short Term Goals -  09/04/14 1614    PT SHORT TERM GOAL #1   Title Pt to be I in HEP   Status On-going   PT SHORT TERM GOAL #2   Title Pain no greater than a 3/10   Status On-going   PT SHORT TERM GOAL #3   Title Pt to have not fallen in 2 weeks    Status On-going          PT Long Term Goals - 09/04/14 1618    PT LONG TERM GOAL #1   Title I in advance HEP   PT LONG TERM GOAL #2   Title Pain no greater than a 1/10 80% of the day   PT LONG TERM GOAL #3   Title Pt to be able to praise sing without increased pain   PT LONG TERM GOAL #4   Title Pt to return to gym   PT LONG TERM GOAL #5   Title Pt strength wnl    Status On-going          Plan - 09/04/14 1610    Clinical Impression Statement Pt progressing balance and ankle stability progressing well.  Pt able to complete  56" Lt SLS on airex and 60"+ Rt LE.  Strengthening exercises complete on airex this sessoin.  Reviewed proper technique with sidely8ing eversion and with theraband.  Pt given blue theraband for increased resistance for strengthening.  Ended session with ionto for pain control on anterior aspect over anterior tibalis.  Pt encouraged to keep pad on 3-4 hours following sessoin.   PT Plan Continue with current PT POC for ankle strengthening and pain releif. Continue with iontophoresis.        Problem List Patient Active Problem List   Diagnosis Date Noted  . Leg weakness 02/12/2014  . Stiffness of joint, not elsewhere classified, lower leg 02/12/2014  . Difficulty in walking(719.7) 02/12/2014  . Recurrent colitis due to Clostridium difficile 07/03/2013  . LLQ pain 10/04/2012  . Irritable bowel syndrome 05/04/2012  . GERD (gastroesophageal reflux disease) 02/14/2012    Aldona Lento, PTA Aldona Lento 09/04/2014, 4:21 PM

## 2014-09-09 ENCOUNTER — Ambulatory Visit (HOSPITAL_COMMUNITY)
Admission: RE | Admit: 2014-09-09 | Discharge: 2014-09-09 | Disposition: A | Discharge status: Home / Self Care | Payer: Medicare Other | Source: Ambulatory Visit | Attending: Sports Medicine | Admitting: Sports Medicine

## 2014-09-09 DIAGNOSIS — Z5189 Encounter for other specified aftercare: Secondary | ICD-10-CM | POA: Diagnosis not present

## 2014-09-09 DIAGNOSIS — M25572 Pain in left ankle and joints of left foot: Secondary | ICD-10-CM

## 2014-09-09 DIAGNOSIS — R29898 Other symptoms and signs involving the musculoskeletal system: Secondary | ICD-10-CM

## 2014-09-09 NOTE — Therapy (Addendum)
Physical Therapy Treatment  Patient Details  Name: Mary Hart MRN: [id number] Date of Birth: [birth year]  Encounter Date: 09/09/2014      PT End of Session - 09/11/14 1401    Visit Number 5   Number of Visits 9   Date for PT Re-Evaluation 09/16/14   Authorization Type medicare    Authorization - Visit Number 5   Authorization - Number of Visits 9   PT Start Time 7121   PT Stop Time 9758   PT Time Calculation (min) 44 min      Past Medical History  Diagnosis Date  . GERD (gastroesophageal reflux disease)   . Hypothyroid   . Arthritis   . Neuropathy   . Depression   . IBS (irritable bowel syndrome)   . Clostridium difficile infection     treated with vancomycin  . Internal hemorrhoids   . Diverticula of colon     minimal    Past Surgical History  Procedure Laterality Date  . Colonoscopy  06/20/2008    [place] Dr. Wynelle Bourgeois external hemorrhoids  . Anterior cervical fusion x 2    . Joint replacement  09/2010    Thumb  . Vesicovaginal fistula closure w/ tah    . Cholecystectomy    . Esophagogastroduodenoscopy  11/25/10    Rourk- tubular esophagus s/p of 56 dilator from a small HH/otherwise normal  . Abdominal hysterectomy    . Colonoscopy  10/12/2012    Dr. Gala Romney- internal hemorrhoids, very early, few, sigmoid diverticula o/w normal appearing colon and terminal ileum. + cdiff on stool samples taken. Segmental bx negative.    There were no vitals taken for this visit.  Visit Diagnosis:  Pain in joint, ankle and foot, left  Weakness of left lower extremity Pt states that her ankle is getting better but she is still having difficulty with prolong walking.   Pain:  3/10 Exercises:  Standing: Baps L3 x 10 Heel raise x 10 SLS x 5  Sitting: Cybex leg press 3 PL x 10 Sidelying: PRE for eversion 3,5# x 10 each  Iontophoresis Dexamethasone 60MA x 15'        Plan - 09/11/14 1405    Clinical Impression Statement Added standing vector, sidelyinhg everson  PRE.  Pt strength is improving but continues to have limitation especially on uneven ground    PT Next Visit Plan Pt to MD on Friday reassess for MD appointment on WEd. Begin braiding activity.         Problem List Patient Active Problem List   Diagnosis Date Noted  . Leg weakness 02/12/2014  . Stiffness of joint, not elsewhere classified, lower leg 02/12/2014  . Difficulty in walking(719.7) 02/12/2014  . Recurrent colitis due to Clostridium difficile 07/03/2013  . LLQ pain 10/04/2012  . Irritable bowel syndrome 05/04/2012  . GERD (gastroesophageal reflux disease) 02/14/2012           RUSSELL,CINDY PT/CLT 09/11/2014, 2:07 PM

## 2014-09-09 NOTE — Addendum Note (Signed)
Encounter addended by: Susy Frizzle, PTA on: 09/09/2014  2:23 PM<BR>     Documentation filed: Clinical Notes

## 2014-09-11 ENCOUNTER — Ambulatory Visit (HOSPITAL_COMMUNITY)
Admission: RE | Admit: 2014-09-11 | Discharge: 2014-09-11 | Disposition: A | Discharge status: Home / Self Care | Payer: Medicare Other | Source: Ambulatory Visit | Attending: Sports Medicine | Admitting: Sports Medicine

## 2014-09-11 ENCOUNTER — Ambulatory Visit (HOSPITAL_COMMUNITY): Payer: Medicare Other | Admitting: Physical Therapy

## 2014-09-11 DIAGNOSIS — Z5189 Encounter for other specified aftercare: Secondary | ICD-10-CM | POA: Diagnosis not present

## 2014-09-11 DIAGNOSIS — R29898 Other symptoms and signs involving the musculoskeletal system: Secondary | ICD-10-CM

## 2014-09-11 DIAGNOSIS — M25572 Pain in left ankle and joints of left foot: Secondary | ICD-10-CM

## 2014-09-11 NOTE — Progress Notes (Deleted)
   09/09/14 1527  Modalities  Modalities Iontophoresis  Iontophoresis  Type of Iontophoresis Dexamethasone  Location lateral aspect of ankle  Dose 3mA  Time 15'  Ankle Exercises: Standing  Heel Raises Limitations 10 (Lt  only )  SLS LT 30 x 5   BAPS Level 3;10 reps  Vector Stance Left;3 reps;10 seconds  BAPS Limitations Dorsi/Plantar flexion; Inv/Env,CW/CCW 10reps each  Ankle Exercises: Machines for Strengthening  Cybex Leg Press L3 x 10   Ankle Exercises: Sidelying  Ankle Eversion Limitations;Left (PRE x 10 @ 3#, 5#)

## 2014-09-11 NOTE — Addendum Note (Signed)
Encounter addended by: Leeroy Cha, PT on: 09/11/2014  2:12 PM<BR>     Documentation filed: Flowsheet VN, Clinical Notes, Inpatient Document Flowsheet

## 2014-09-11 NOTE — Addendum Note (Signed)
Encounter addended by: Leeroy Cha, PT on: 09/11/2014  3:09 PM<BR>     Documentation filed: Clinical Notes

## 2014-09-11 NOTE — Therapy (Signed)
Physical Therapy Treatment  Patient Details  Name: Mary Hart MRN: [id number] Date of Birth: 02-15-1941  Encounter Date: 09/11/2014      PT End of Session - 09/11/14 1535    Visit Number 6   Number of Visits 9   Authorization Type medicare    Authorization - Visit Number 6   Authorization - Number of Visits 9   PT Start Time 3570   PT Stop Time 1600   PT Time Calculation (min) 45 min   Activity Tolerance Patient tolerated treatment well   Behavior During Therapy [facility] for tasks assessed/performed      Past Medical History  Diagnosis Date  . GERD (gastroesophageal reflux disease)   . Hypothyroid   . Arthritis   . Neuropathy   . Depression   . IBS (irritable bowel syndrome)   . Clostridium difficile infection     treated with vancomycin  . Internal hemorrhoids   . Diverticula of colon     minimal    Past Surgical History  Procedure Laterality Date  . Colonoscopy  06/20/2008    [place] Dr. Wynelle Bourgeois external hemorrhoids  . Anterior cervical fusion x 2    . Joint replacement  09/2010    Thumb  . Vesicovaginal fistula closure w/ tah    . Cholecystectomy    . Esophagogastroduodenoscopy  11/25/10    Rourk- tubular esophagus s/p of 56 dilator from a small HH/otherwise normal  . Abdominal hysterectomy    . Colonoscopy  10/12/2012    Dr. Gala Romney- internal hemorrhoids, very early, few, sigmoid diverticula o/w normal appearing colon and terminal ileum. + cdiff on stool samples taken. Segmental bx negative.    There were no vitals taken for this visit.  Visit Diagnosis:  Pain in joint, ankle and foot, left  Weakness of left lower extremity      Subjective Assessment - 09/11/14 1924    Symptoms Patient states "it feels good pain is like a 1 or 2 , not really pain just weak." Only notices it when walking          [facility] PT Assessment - 09/11/14 1544    Assessment   Medical Diagnosis Lt ankle pain    Onset Date 06/03/14   Next MD Visit 09/13/2014   Prior Therapy  none   Precautions   Precautions Fall   Prior Function   Level of Independence Independent with basic ADLs   Vocation Retired   Leisure praise sing, goes to gym    Observation/Other Assessments   Focus on Therapeutic Outcomes (FOTO)  33% limited   AROM   Left Ankle Dorsiflexion 10   Left Ankle Plantar Flexion 68   Left Ankle Inversion 42   Left Ankle Eversion 24   Strength   Overall Strength Comments eversion 5/5; plantarflexion 4/5 (17 single leg calf raises)   Balance   Balance Assessed Yes   Static Standing Balance   Static Standing - Balance Support No upper extremity supported   Static Standing - Level of Assistance 7: Independent          [facility] Adult PT Treatment/Exercise - 09/11/14 0001    Modalities   Modalities Iontophoresis   Iontophoresis   Type of Iontophoresis Dexamethasone   Location lateral aspect of ankle   Dose 5m   Time 15'   Ankle Exercises: Standing   BAPS Level 3;10 reps   BAPS Limitations Dorsi/Plantar flexion; Inv/Env,CW/CCW 10reps each   Vector Stance Left;3 reps;10 seconds   SLS LT  30 x 5    Heel Raises Limitations 10  Lt  only    Ankle Exercises: Stretches   Slant Board Stretch 3 reps;30 seconds            PT Short Term Goals - 03-Oct-2014 1927    PT SHORT TERM GOAL #1   Title Pt to be I in HEP   Time 5   Period Days   Status Achieved   PT SHORT TERM GOAL #2   Title Pain no greater than a 3/10   Time 2   Period Weeks   Status Achieved   PT SHORT TERM GOAL #3   Title Pt to have not fallen in 2 weeks    Time 2   Period Weeks   Status Achieved          PT Long Term Goals - 10-03-2014 1927    PT LONG TERM GOAL #1   Title I in advance HEP   Time 3   Period Weeks   Status Achieved   PT LONG TERM GOAL #2   Title Pain no greater than a 1/10 80% of the day   Time 3   Period Weeks   PT LONG TERM GOAL #3   Title Pt to be able to praise sing without increased pain   Time 3   Period Weeks   Status Achieved   PT LONG TERM  GOAL #4   Title Pt to return to gym   Time 3   Period Weeks   Status Partially Met  patient still has pain with some exercises   PT LONG TERM GOAL #5   Title Pt strength wnl    Time 3   Period Weeks   Status Partially Met          Plan - 10/03/2014 1926    Clinical Impression Statement Patient has met all goals and is mostly pain free except follwoign very strenuous activity. Patient is expected to be pain free with all activityies with independent performance of HEP.    PT Next Visit Plan Pt to MD on Friday reassess for MD appointment on WEd. Begin braiding activity.    PT Plan Patient discharged with HEP          G-Codes - 03-Oct-2014 1530    Functional Assessment Tool Used foto 33% limited   Functional Limitation Mobility: Walking and moving around   Mobility: Walking and Moving Around Current Status [phone]) At least 40 percent but less than 60 percent impaired, limited or restricted   Mobility: Walking and Moving Around Goal Status [phone]) At least 20 percent but less than 40 percent impaired, limited or restricted      Problem List Patient Active Problem List   Diagnosis Date Noted  . Leg weakness 02/12/2014  . Stiffness of joint, not elsewhere classified, lower leg 02/12/2014  . Difficulty in walking(719.7) 02/12/2014  . Recurrent colitis due to Clostridium difficile 07/03/2013  . LLQ pain 10/04/2012  . Irritable bowel syndrome 05/04/2012  . GERD (gastroesophageal reflux disease) 02/14/2012     PHYSICAL THERAPY DISCHARGE SUMMARY  Visits from Start of Care: 6  Current functional level related to goals / functional outcomes: Independent with all ADLs   Remaining deficits: Pain only with prolonged strenuous activities   Education / Equipment: none Plan: Patient agrees to discharge.  Patient goals were met. Patient is being discharged due to meeting the stated rehab goals.  ?????        Chanice Brenton  PT DPT [phone]

## 2014-09-11 NOTE — Addendum Note (Signed)
Encounter addended by: Leia Alf, PT on: 09/11/2014  7:34 PM<BR>     Documentation filed: Episodes

## 2014-09-13 DIAGNOSIS — M25572 Pain in left ankle and joints of left foot: Secondary | ICD-10-CM | POA: Diagnosis not present

## 2014-09-13 DIAGNOSIS — S8265XD Nondisplaced fracture of lateral malleolus of left fibula, subsequent encounter for closed fracture with routine healing: Secondary | ICD-10-CM | POA: Diagnosis not present

## 2014-09-16 ENCOUNTER — Ambulatory Visit (HOSPITAL_COMMUNITY): Payer: Medicare Other | Admitting: Physical Therapy

## 2014-09-18 ENCOUNTER — Ambulatory Visit (HOSPITAL_COMMUNITY): Payer: Medicare Other

## 2014-09-23 ENCOUNTER — Encounter (HOSPITAL_COMMUNITY): Payer: Medicare Other

## 2014-09-25 ENCOUNTER — Encounter (HOSPITAL_COMMUNITY): Payer: Medicare Other | Admitting: Physical Therapy

## 2014-09-25 DIAGNOSIS — N958 Other specified menopausal and perimenopausal disorders: Secondary | ICD-10-CM | POA: Diagnosis not present

## 2014-09-25 DIAGNOSIS — Z01419 Encounter for gynecological examination (general) (routine) without abnormal findings: Secondary | ICD-10-CM | POA: Diagnosis not present

## 2014-09-25 DIAGNOSIS — M8588 Other specified disorders of bone density and structure, other site: Secondary | ICD-10-CM | POA: Diagnosis not present

## 2014-09-25 DIAGNOSIS — M818 Other osteoporosis without current pathological fracture: Secondary | ICD-10-CM | POA: Diagnosis not present

## 2014-09-26 DIAGNOSIS — M1711 Unilateral primary osteoarthritis, right knee: Secondary | ICD-10-CM | POA: Diagnosis not present

## 2014-09-30 ENCOUNTER — Encounter (HOSPITAL_COMMUNITY): Payer: Medicare Other | Admitting: Physical Therapy

## 2014-10-02 ENCOUNTER — Encounter (HOSPITAL_COMMUNITY): Payer: Medicare Other | Admitting: Physical Therapy

## 2014-10-04 ENCOUNTER — Other Ambulatory Visit (HOSPITAL_COMMUNITY): Payer: Self-pay | Admitting: *Deleted

## 2014-10-07 ENCOUNTER — Encounter (HOSPITAL_COMMUNITY): Payer: Medicare Other | Admitting: Physical Therapy

## 2014-10-08 ENCOUNTER — Inpatient Hospital Stay (HOSPITAL_COMMUNITY): Admission: RE | Admit: 2014-10-08 | Payer: Medicare Other | Source: Ambulatory Visit

## 2014-10-09 ENCOUNTER — Encounter (HOSPITAL_COMMUNITY): Payer: Medicare Other | Admitting: Physical Therapy

## 2014-10-09 DIAGNOSIS — R921 Mammographic calcification found on diagnostic imaging of breast: Secondary | ICD-10-CM | POA: Diagnosis not present

## 2014-10-09 DIAGNOSIS — R928 Other abnormal and inconclusive findings on diagnostic imaging of breast: Secondary | ICD-10-CM | POA: Diagnosis not present

## 2014-10-14 ENCOUNTER — Encounter (HOSPITAL_COMMUNITY): Payer: Medicare Other | Admitting: Physical Therapy

## 2014-10-14 ENCOUNTER — Other Ambulatory Visit (HOSPITAL_COMMUNITY): Payer: Self-pay | Admitting: *Deleted

## 2014-10-15 ENCOUNTER — Ambulatory Visit (HOSPITAL_COMMUNITY)
Admission: RE | Admit: 2014-10-15 | Discharge: 2014-10-15 | Disposition: A | Discharge status: Home / Self Care | Payer: Medicare Other | Source: Ambulatory Visit | Attending: Obstetrics and Gynecology | Admitting: Obstetrics and Gynecology

## 2014-10-15 DIAGNOSIS — M810 Age-related osteoporosis without current pathological fracture: Secondary | ICD-10-CM | POA: Insufficient documentation

## 2014-10-15 MED ORDER — ZOLEDRONIC ACID 5 MG/100ML IV SOLN
5.0000 mg | Freq: Once | INTRAVENOUS | Status: AC
  Administered 2014-10-15: 5 mg via INTRAVENOUS

## 2014-10-15 MED ORDER — ZOLEDRONIC ACID 5 MG/100ML IV SOLN
INTRAVENOUS | Status: AC
  Administered 2014-10-15: 5 mg via INTRAVENOUS
  Filled 2014-10-15: qty 100

## 2014-10-16 ENCOUNTER — Encounter (HOSPITAL_COMMUNITY): Payer: Medicare Other | Admitting: Physical Therapy

## 2014-10-21 ENCOUNTER — Encounter (HOSPITAL_COMMUNITY): Payer: Medicare Other | Admitting: Physical Therapy

## 2014-10-23 ENCOUNTER — Encounter (HOSPITAL_COMMUNITY): Payer: Medicare Other

## 2014-10-31 DIAGNOSIS — M1990 Unspecified osteoarthritis, unspecified site: Secondary | ICD-10-CM | POA: Diagnosis not present

## 2014-10-31 DIAGNOSIS — M810 Age-related osteoporosis without current pathological fracture: Secondary | ICD-10-CM | POA: Diagnosis not present

## 2014-10-31 DIAGNOSIS — M2550 Pain in unspecified joint: Secondary | ICD-10-CM | POA: Diagnosis not present

## 2014-10-31 DIAGNOSIS — R5383 Other fatigue: Secondary | ICD-10-CM | POA: Diagnosis not present

## 2014-11-25 DIAGNOSIS — M2550 Pain in unspecified joint: Secondary | ICD-10-CM | POA: Diagnosis not present

## 2014-11-25 DIAGNOSIS — M064 Inflammatory polyarthropathy: Secondary | ICD-10-CM | POA: Diagnosis not present

## 2014-11-25 DIAGNOSIS — M1990 Unspecified osteoarthritis, unspecified site: Secondary | ICD-10-CM | POA: Diagnosis not present

## 2014-12-12 DIAGNOSIS — F4320 Adjustment disorder, unspecified: Secondary | ICD-10-CM | POA: Diagnosis not present

## 2014-12-12 DIAGNOSIS — G4440 Drug-induced headache, not elsewhere classified, not intractable: Secondary | ICD-10-CM | POA: Diagnosis not present

## 2014-12-12 DIAGNOSIS — Z6825 Body mass index (BMI) 25.0-25.9, adult: Secondary | ICD-10-CM | POA: Diagnosis not present

## 2014-12-12 DIAGNOSIS — E663 Overweight: Secondary | ICD-10-CM | POA: Diagnosis not present

## 2014-12-25 DIAGNOSIS — N390 Urinary tract infection, site not specified: Secondary | ICD-10-CM | POA: Diagnosis not present

## 2014-12-25 DIAGNOSIS — N762 Acute vulvitis: Secondary | ICD-10-CM | POA: Diagnosis not present

## 2014-12-31 DIAGNOSIS — Z961 Presence of intraocular lens: Secondary | ICD-10-CM | POA: Diagnosis not present

## 2014-12-31 DIAGNOSIS — H524 Presbyopia: Secondary | ICD-10-CM | POA: Diagnosis not present

## 2014-12-31 DIAGNOSIS — H3531 Nonexudative age-related macular degeneration: Secondary | ICD-10-CM | POA: Diagnosis not present

## 2015-02-07 DIAGNOSIS — H3531 Nonexudative age-related macular degeneration: Secondary | ICD-10-CM | POA: Diagnosis not present

## 2015-02-17 ENCOUNTER — Emergency Department (HOSPITAL_COMMUNITY)
Admission: EM | Admit: 2015-02-17 | Discharge: 2015-02-17 | Disposition: A | Discharge status: Home / Self Care | Payer: Medicare Other | Attending: Emergency Medicine | Admitting: Emergency Medicine

## 2015-02-17 ENCOUNTER — Encounter (HOSPITAL_COMMUNITY): Payer: Self-pay | Admitting: Emergency Medicine

## 2015-02-17 ENCOUNTER — Emergency Department (HOSPITAL_COMMUNITY): Payer: Medicare Other

## 2015-02-17 DIAGNOSIS — Y9389 Activity, other specified: Secondary | ICD-10-CM | POA: Insufficient documentation

## 2015-02-17 DIAGNOSIS — Y9289 Other specified places as the place of occurrence of the external cause: Secondary | ICD-10-CM | POA: Diagnosis not present

## 2015-02-17 DIAGNOSIS — W1839XA Other fall on same level, initial encounter: Secondary | ICD-10-CM | POA: Diagnosis not present

## 2015-02-17 DIAGNOSIS — S93401A Sprain of unspecified ligament of right ankle, initial encounter: Secondary | ICD-10-CM | POA: Diagnosis not present

## 2015-02-17 DIAGNOSIS — Z720 Tobacco use: Secondary | ICD-10-CM | POA: Insufficient documentation

## 2015-02-17 DIAGNOSIS — Z8719 Personal history of other diseases of the digestive system: Secondary | ICD-10-CM | POA: Diagnosis not present

## 2015-02-17 DIAGNOSIS — F329 Major depressive disorder, single episode, unspecified: Secondary | ICD-10-CM | POA: Diagnosis not present

## 2015-02-17 DIAGNOSIS — M1990 Unspecified osteoarthritis, unspecified site: Secondary | ICD-10-CM | POA: Insufficient documentation

## 2015-02-17 DIAGNOSIS — Z8619 Personal history of other infectious and parasitic diseases: Secondary | ICD-10-CM | POA: Insufficient documentation

## 2015-02-17 DIAGNOSIS — M25571 Pain in right ankle and joints of right foot: Secondary | ICD-10-CM | POA: Diagnosis not present

## 2015-02-17 DIAGNOSIS — Z79899 Other long term (current) drug therapy: Secondary | ICD-10-CM | POA: Diagnosis not present

## 2015-02-17 DIAGNOSIS — Y998 Other external cause status: Secondary | ICD-10-CM | POA: Diagnosis not present

## 2015-02-17 DIAGNOSIS — S99911A Unspecified injury of right ankle, initial encounter: Secondary | ICD-10-CM | POA: Diagnosis not present

## 2015-02-17 DIAGNOSIS — E039 Hypothyroidism, unspecified: Secondary | ICD-10-CM | POA: Insufficient documentation

## 2015-02-17 DIAGNOSIS — M25471 Effusion, right ankle: Secondary | ICD-10-CM | POA: Diagnosis not present

## 2015-02-17 NOTE — ED Notes (Signed)
Pt reports R ankle pain from a fall on Wed.

## 2015-02-17 NOTE — ED Provider Notes (Signed)
CSN: [id number]     Arrival date & time 02/17/15  1124 History   First MD Initiated Contact with Patient 02/17/15 1313     Chief Complaint  Patient presents with  . Fall     (Consider location/radiation/quality/duration/timing/severity/associated sxs/prior Treatment) HPI Comments: Patient is a 74 year old female who sustained a fall on Wednesday April 20. The patient states that she has noted increasing bruising involving the right ankle. She is concerned for fracture, because of the bruising, and because she sustained a fracture of her left ankle in the past. No other injury reported.  Patient is a 74 y.o. female presenting with ankle pain. The history is provided by the patient.  Ankle Pain Associated symptoms: no back pain and no neck pain     Past Medical History  Diagnosis Date  . GERD (gastroesophageal reflux disease)   . Hypothyroid   . Arthritis   . Neuropathy   . Depression   . IBS (irritable bowel syndrome)   . Clostridium difficile infection     treated with vancomycin  . Internal hemorrhoids   . Diverticula of colon     minimal   Past Surgical History  Procedure Laterality Date  . Colonoscopy  06/20/2008    [place] Dr. Wynelle Bourgeois external hemorrhoids  . Anterior cervical fusion x 2    . Joint replacement  09/2010    Thumb  . Vesicovaginal fistula closure w/ tah    . Cholecystectomy    . Esophagogastroduodenoscopy  11/25/10    Rourk- tubular esophagus s/p of 56 dilator from a small HH/otherwise normal  . Abdominal hysterectomy    . Colonoscopy  10/12/2012    Dr. Gala Romney- internal hemorrhoids, very early, few, sigmoid diverticula o/w normal appearing colon and terminal ileum. + cdiff on stool samples taken. Segmental bx negative.   Family History  Problem Relation Age of Onset  . Colon cancer Maternal Uncle   . Dementia Father   . Dementia Mother   . Breast cancer Mother    History  Substance Use Topics  . Smoking status: Current Every Day Smoker -- 0.50  packs/day for 50 years    Types: Cigarettes  . Smokeless tobacco: Never Used     Comment: 1/2 pack per day  . Alcohol Use: Yes   OB History    Gravida Para Term Preterm AB TAB SAB Ectopic Multiple Living   1 1 1             Review of Systems  Constitutional: Negative for activity change.       All ROS Neg except as noted in HPI  HENT: Negative for nosebleeds.   Eyes: Negative for photophobia and discharge.  Respiratory: Negative for cough, shortness of breath and wheezing.   Cardiovascular: Negative for chest pain and palpitations.  Gastrointestinal: Negative for abdominal pain and blood in stool.  Genitourinary: Negative for dysuria, frequency and hematuria.  Musculoskeletal: Positive for arthralgias. Negative for back pain and neck pain.  Skin: Negative.   Neurological: Negative for dizziness, seizures and speech difficulty.  Psychiatric/Behavioral: Negative for hallucinations and confusion.       Depression      Allergies  Codeine; Hydromorphone hcl; Morphine; Moxifloxacin; and Sulfonamide derivatives  Home Medications   Prior to Admission medications   Medication Sig Start Date End Date Taking? Authorizing Provider  ALPRAZolam Duanne Moron) 0.25 MG tablet Take 1 tablet by mouth 3 (three) times daily as needed for anxiety.  08/09/12  Yes Historical Provider, MD  Aspirin-Salicylamide-Caffeine [facility] HEADACHE  POWDER PO) Take 1 Package by mouth 3 (three) times daily as needed (pain/headaches).   Yes Historical Provider, MD  cetirizine (ZYRTEC) 10 MG tablet Take 10 mg by mouth at bedtime.   Yes Historical Provider, MD  citalopram (CELEXA) 20 MG tablet Take 20 mg by mouth daily.   Yes Historical Provider, MD  levothyroxine (SYNTHROID, LEVOTHROID) 25 MCG tablet Take 25 mcg by mouth daily.     Yes Historical Provider, MD  saccharomyces boulardii (FLORASTOR) 250 MG capsule Take 250 mg by mouth daily.   Yes Historical Provider, MD  Simethicone (GAS-X PO) Take 1 capsule by mouth daily as  needed (gas relief).    Yes Historical Provider, MD   BP 146/77 mmHg  Pulse 77  Temp(Src) 97.7 F (36.5 C) (Oral)  Resp 16  Ht 5\' 3"  (1.6 m)  Wt 140 lb (63.504 kg)  BMI 24.81 kg/m2  SpO2 100% Physical Exam  Constitutional: She is oriented to person, place, and time. She appears well-developed and well-nourished.  Non-toxic appearance.  HENT:  Head: Normocephalic.  Right Ear: Tympanic membrane and external ear normal.  Left Ear: Tympanic membrane and external ear normal.  Eyes: EOM and lids are normal. Pupils are equal, round, and reactive to light.  Neck: Normal range of motion. Neck supple. Carotid bruit is not present.  Cardiovascular: Normal rate, regular rhythm, normal heart sounds, intact distal pulses and normal pulses.   Pulmonary/Chest: Breath sounds normal. No respiratory distress.  No chest wall pain. Symmetrical rise and fall of the chest. Patient speaks in complete sentences.  Abdominal: Soft. Bowel sounds are normal. There is no tenderness. There is no guarding.  Musculoskeletal: Normal range of motion.  There is no pain to movement of the pelvis. There is no palpable deformity of the right hip. There is good range of motion of the right knee. There is no deformity of the tibial area. There is bruising noted of the lateral malleolus. There is full range of motion of the toes. The Achilles tendon is intact. There is soreness with attempted range of motion of the ankle.  Lymphadenopathy:       Head (right side): No submandibular adenopathy present.       Head (left side): No submandibular adenopathy present.    She has no cervical adenopathy.  Neurological: She is alert and oriented to person, place, and time. She has normal strength. No cranial nerve deficit or sensory deficit.  Skin: Skin is warm and dry.  Psychiatric: She has a normal mood and affect. Her speech is normal.  Nursing note and vitals reviewed.   ED Course  Procedures (including critical care time) Labs  Review Labs Reviewed - No data to display  Imaging Review Dg Ankle Complete Right  02/17/2015   CLINICAL DATA:  Persistent pain and swelling following fall 5 days prior  EXAM: RIGHT ANKLE - COMPLETE 3+ VIEW  COMPARISON:  January 01, 2013  FINDINGS: Frontal, oblique, and lateral views were obtained. There is no fracture or joint effusion. The ankle mortise appears intact. There are spurs arising from the inferior and posterior calcaneus.  IMPRESSION: Calcaneal spurs.  No fracture.  Mortise intact.   Electronically Signed   By: Lowella Grip III M.D.   On: 02/17/2015 12:28     EKG Interpretation None      MDM  X-ray of the right ankle is negative for fracture or dislocation. There are calcaneal spurs present. The vital signs are well within normal limits. The patient is  treated with a ankle stirrup splint. The patient is advised to elevate the ankle is much as possible. She will return to the emergency department, or see her primary physician if not improving.    Final diagnoses:  None    *I have reviewed nursing notes, vital signs, and all appropriate lab and imaging results for this patient.[address], PA-C 02/17/15 1749  Orpah Greek, MD 02/19/15 [phone]

## 2015-02-17 NOTE — Discharge Instructions (Signed)
Your x-ray is negative for fracture or dislocation. Please use your ankle stirrup splint when up and about. You do not need to sleep in this device. Please see Dr. Daryll Brod Ankle Sprain An ankle sprain is an injury to the strong, fibrous tissues (ligaments) that hold the bones of your ankle joint together.  CAUSES An ankle sprain is usually caused by a fall or by twisting your ankle. Ankle sprains most commonly occur when you step on the outer edge of your foot, and your ankle turns inward. People who participate in sports are more prone to these types of injuries.  SYMPTOMS   Pain in your ankle. The pain may be present at rest or only when you are trying to stand or walk.  Swelling.  Bruising. Bruising may develop immediately or within 1 to 2 days after your injury.  Difficulty standing or walking, particularly when turning corners or changing directions. DIAGNOSIS  Your caregiver will ask you details about your injury and perform a physical exam of your ankle to determine if you have an ankle sprain. During the physical exam, your caregiver will press on and apply pressure to specific areas of your foot and ankle. Your caregiver will try to move your ankle in certain ways. An X-ray exam may be done to be sure a bone was not broken or a ligament did not separate from one of the bones in your ankle (avulsion fracture).  TREATMENT  Certain types of braces can help stabilize your ankle. Your caregiver can make a recommendation for this. Your caregiver may recommend the use of medicine for pain. If your sprain is severe, your caregiver may refer you to a surgeon who helps to restore function to parts of your skeletal system (orthopedist) or a physical therapist. [place] ice to your injury for 1-2 days or as directed by your caregiver. Applying ice helps to reduce inflammation and pain.  Put ice in a plastic bag.  Place a towel between your skin and the bag.  Leave the ice on  for 15-20 minutes at a time, every 2 hours while you are awake.  Only take over-the-counter or prescription medicines for pain, discomfort, or fever as directed by your caregiver.  Elevate your injured ankle above the level of your heart as much as possible for 2-3 days.  If your caregiver recommends crutches, use them as instructed. Gradually put weight on the affected ankle. Continue to use crutches or a cane until you can walk without feeling pain in your ankle.  If you have a plaster splint, wear the splint as directed by your caregiver. Do not rest it on anything harder than a pillow for the first 24 hours. Do not put weight on it. Do not get it wet. You may take it off to take a shower or bath.  You may have been given an elastic bandage to wear around your ankle to provide support. If the elastic bandage is too tight (you have numbness or tingling in your foot or your foot becomes cold and blue), adjust the bandage to make it comfortable.  If you have an air splint, you may blow more air into it or let air out to make it more comfortable. You may take your splint off at night and before taking a shower or bath. Wiggle your toes in the splint several times per day to decrease swelling. SEEK MEDICAL CARE IF:   You have rapidly increasing bruising or swelling.  Your  toes feel extremely cold or you lose feeling in your foot.  Your pain is not relieved with medicine. SEEK IMMEDIATE MEDICAL CARE IF:  Your toes are numb or blue.  You have severe pain that is increasing. MAKE SURE YOU:   Understand these instructions.  Will watch your condition.  Will get help right away if you are not doing well or get worse. Document Released: 10/11/2005 Document Revised: 07/05/2012 Document Reviewed: 10/23/2011 [facility] Patient Information 2015 [place], [place]. This information is not intended to replace advice given to you by your health care provider. Make sure you discuss any questions you have  with your health care provider. sco or member of his team for additional follow-up and management.

## 2015-02-26 DIAGNOSIS — M1990 Unspecified osteoarthritis, unspecified site: Secondary | ICD-10-CM | POA: Diagnosis not present

## 2015-02-26 DIAGNOSIS — M2550 Pain in unspecified joint: Secondary | ICD-10-CM | POA: Diagnosis not present

## 2015-02-26 DIAGNOSIS — M064 Inflammatory polyarthropathy: Secondary | ICD-10-CM | POA: Diagnosis not present

## 2015-03-10 DIAGNOSIS — Z6824 Body mass index (BMI) 24.0-24.9, adult: Secondary | ICD-10-CM | POA: Diagnosis not present

## 2015-03-10 DIAGNOSIS — Z0001 Encounter for general adult medical examination with abnormal findings: Secondary | ICD-10-CM | POA: Diagnosis not present

## 2015-03-10 DIAGNOSIS — J449 Chronic obstructive pulmonary disease, unspecified: Secondary | ICD-10-CM | POA: Diagnosis not present

## 2015-03-10 DIAGNOSIS — J301 Allergic rhinitis due to pollen: Secondary | ICD-10-CM | POA: Diagnosis not present

## 2015-03-27 DIAGNOSIS — E039 Hypothyroidism, unspecified: Secondary | ICD-10-CM | POA: Diagnosis not present

## 2015-04-03 DIAGNOSIS — M531 Cervicobrachial syndrome: Secondary | ICD-10-CM | POA: Diagnosis not present

## 2015-04-03 DIAGNOSIS — M546 Pain in thoracic spine: Secondary | ICD-10-CM | POA: Diagnosis not present

## 2015-04-03 DIAGNOSIS — M47812 Spondylosis without myelopathy or radiculopathy, cervical region: Secondary | ICD-10-CM | POA: Diagnosis not present

## 2015-04-03 DIAGNOSIS — M9901 Segmental and somatic dysfunction of cervical region: Secondary | ICD-10-CM | POA: Diagnosis not present

## 2015-04-08 DIAGNOSIS — M47812 Spondylosis without myelopathy or radiculopathy, cervical region: Secondary | ICD-10-CM | POA: Diagnosis not present

## 2015-04-08 DIAGNOSIS — M546 Pain in thoracic spine: Secondary | ICD-10-CM | POA: Diagnosis not present

## 2015-04-08 DIAGNOSIS — M531 Cervicobrachial syndrome: Secondary | ICD-10-CM | POA: Diagnosis not present

## 2015-04-08 DIAGNOSIS — M9901 Segmental and somatic dysfunction of cervical region: Secondary | ICD-10-CM | POA: Diagnosis not present

## 2015-04-10 DIAGNOSIS — M546 Pain in thoracic spine: Secondary | ICD-10-CM | POA: Diagnosis not present

## 2015-04-10 DIAGNOSIS — M9901 Segmental and somatic dysfunction of cervical region: Secondary | ICD-10-CM | POA: Diagnosis not present

## 2015-04-10 DIAGNOSIS — M531 Cervicobrachial syndrome: Secondary | ICD-10-CM | POA: Diagnosis not present

## 2015-04-10 DIAGNOSIS — M47812 Spondylosis without myelopathy or radiculopathy, cervical region: Secondary | ICD-10-CM | POA: Diagnosis not present

## 2015-04-21 DIAGNOSIS — M546 Pain in thoracic spine: Secondary | ICD-10-CM | POA: Diagnosis not present

## 2015-04-21 DIAGNOSIS — M9901 Segmental and somatic dysfunction of cervical region: Secondary | ICD-10-CM | POA: Diagnosis not present

## 2015-04-21 DIAGNOSIS — M47812 Spondylosis without myelopathy or radiculopathy, cervical region: Secondary | ICD-10-CM | POA: Diagnosis not present

## 2015-04-21 DIAGNOSIS — M531 Cervicobrachial syndrome: Secondary | ICD-10-CM | POA: Diagnosis not present

## 2015-05-17 IMAGING — DX DG ANKLE COMPLETE 3+V*R*
3 series · 3 of 3 positions shown · non-contrast
Comparison: January 01, 2013

CLINICAL DATA: Persistent pain and swelling following fall 5 days
prior

EXAM:
RIGHT ANKLE - COMPLETE 3+ VIEW

[ankle ap]
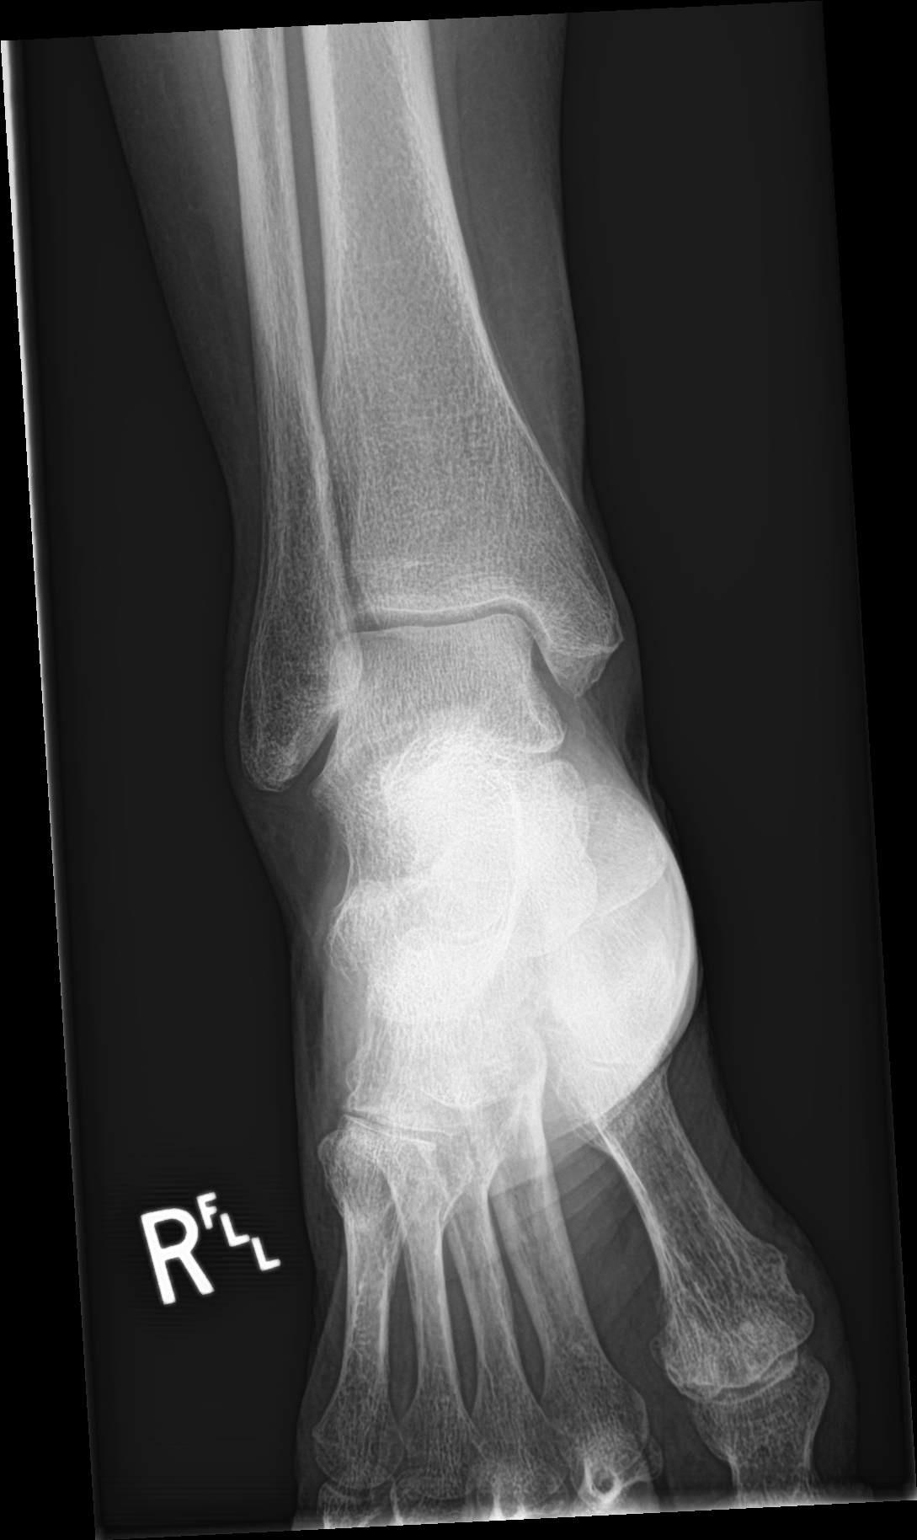

[ankle obl]
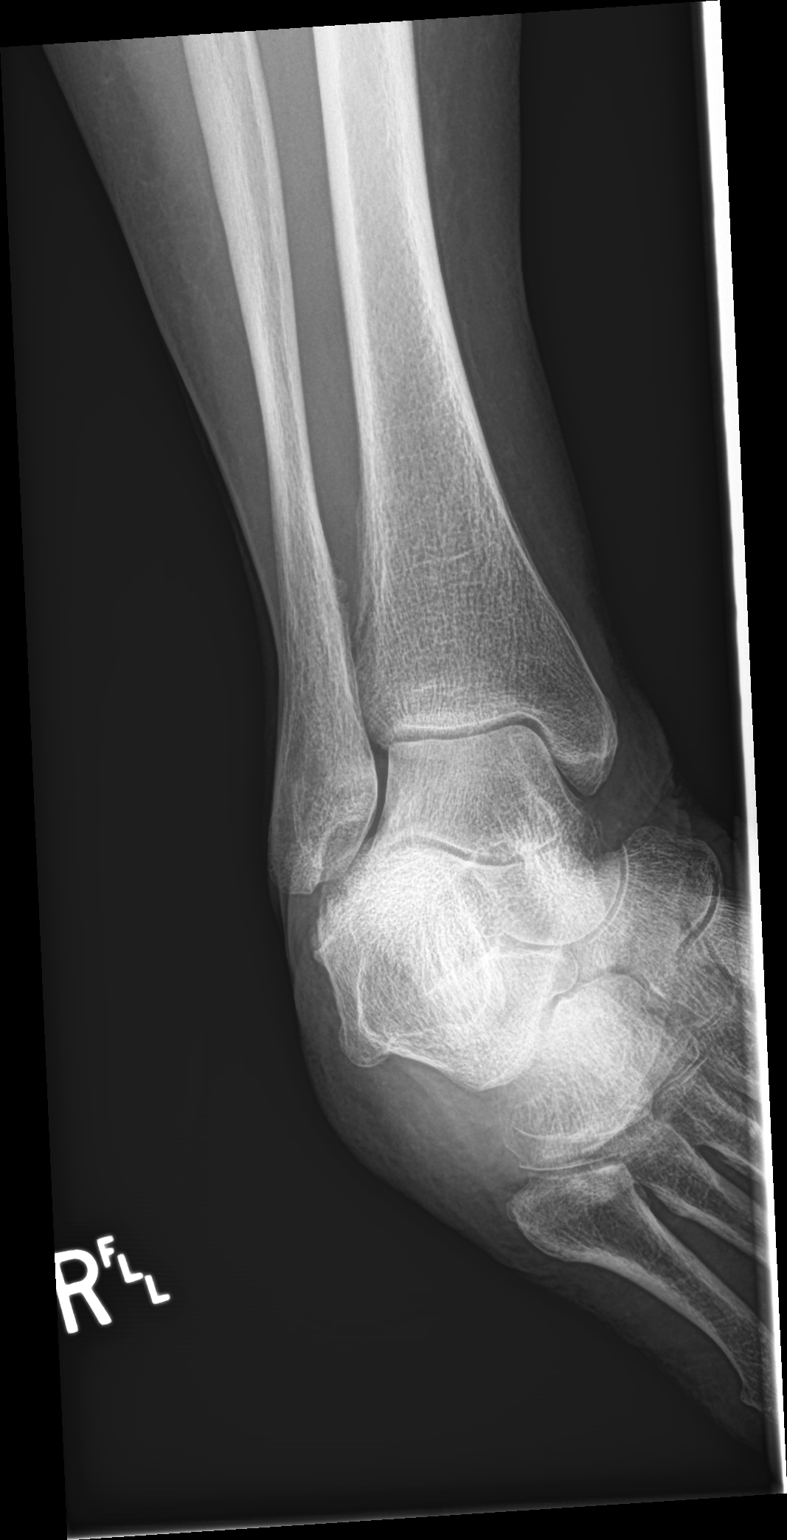

[ankle lat]
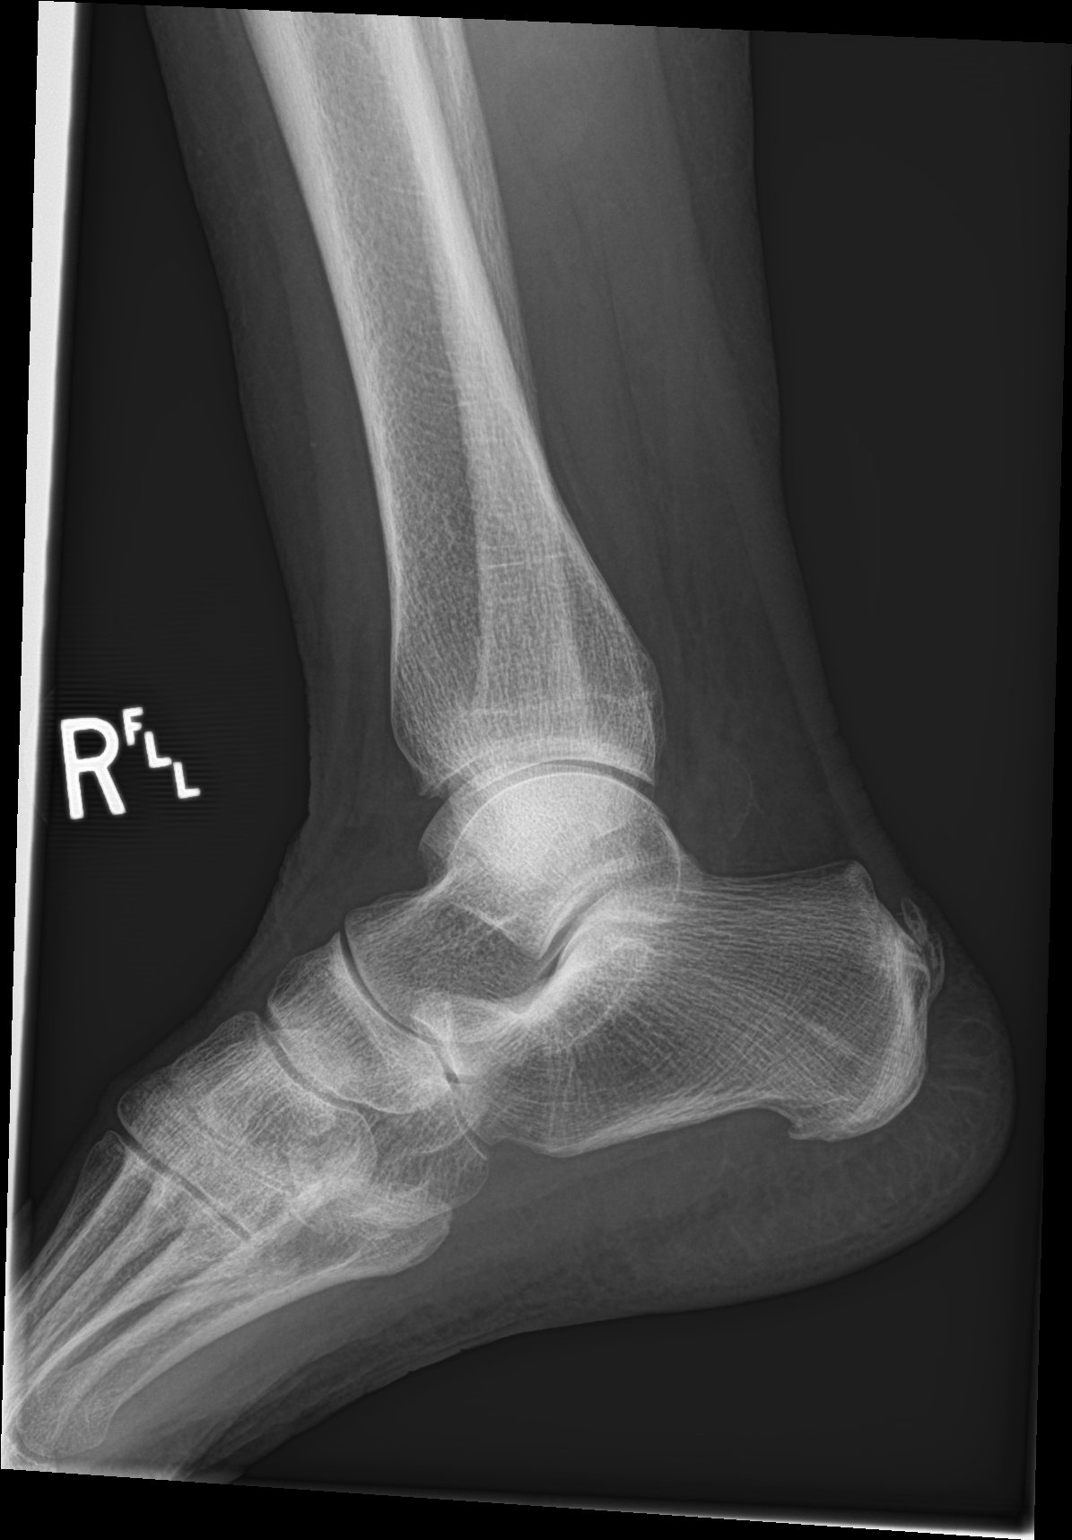

[3 of 3 positions shown; findings below may reference images not displayed]

FINDINGS: Frontal, oblique, and lateral views were obtained. There is no
fracture or joint effusion. The ankle mortise appears intact. There
are spurs arising from the inferior and posterior calcaneus.
IMPRESSION: Calcaneal spurs.  No fracture.  Mortise intact.

## 2015-05-19 DIAGNOSIS — M546 Pain in thoracic spine: Secondary | ICD-10-CM | POA: Diagnosis not present

## 2015-05-19 DIAGNOSIS — G441 Vascular headache, not elsewhere classified: Secondary | ICD-10-CM | POA: Diagnosis not present

## 2015-05-19 DIAGNOSIS — M47812 Spondylosis without myelopathy or radiculopathy, cervical region: Secondary | ICD-10-CM | POA: Diagnosis not present

## 2015-05-19 DIAGNOSIS — M9901 Segmental and somatic dysfunction of cervical region: Secondary | ICD-10-CM | POA: Diagnosis not present

## 2015-05-19 DIAGNOSIS — M531 Cervicobrachial syndrome: Secondary | ICD-10-CM | POA: Diagnosis not present

## 2015-06-02 DIAGNOSIS — M1990 Unspecified osteoarthritis, unspecified site: Secondary | ICD-10-CM | POA: Diagnosis not present

## 2015-06-02 DIAGNOSIS — Z79899 Other long term (current) drug therapy: Secondary | ICD-10-CM | POA: Diagnosis not present

## 2015-06-02 DIAGNOSIS — M79643 Pain in unspecified hand: Secondary | ICD-10-CM | POA: Diagnosis not present

## 2015-06-02 DIAGNOSIS — M542 Cervicalgia: Secondary | ICD-10-CM | POA: Diagnosis not present

## 2015-06-02 DIAGNOSIS — M150 Primary generalized (osteo)arthritis: Secondary | ICD-10-CM | POA: Diagnosis not present

## 2015-06-03 DIAGNOSIS — M546 Pain in thoracic spine: Secondary | ICD-10-CM | POA: Diagnosis not present

## 2015-06-03 DIAGNOSIS — G441 Vascular headache, not elsewhere classified: Secondary | ICD-10-CM | POA: Diagnosis not present

## 2015-06-03 DIAGNOSIS — M531 Cervicobrachial syndrome: Secondary | ICD-10-CM | POA: Diagnosis not present

## 2015-06-03 DIAGNOSIS — M9901 Segmental and somatic dysfunction of cervical region: Secondary | ICD-10-CM | POA: Diagnosis not present

## 2015-06-03 DIAGNOSIS — M47812 Spondylosis without myelopathy or radiculopathy, cervical region: Secondary | ICD-10-CM | POA: Diagnosis not present

## 2015-06-04 DIAGNOSIS — M9901 Segmental and somatic dysfunction of cervical region: Secondary | ICD-10-CM | POA: Diagnosis not present

## 2015-06-04 DIAGNOSIS — M531 Cervicobrachial syndrome: Secondary | ICD-10-CM | POA: Diagnosis not present

## 2015-06-04 DIAGNOSIS — S233XXA Sprain of ligaments of thoracic spine, initial encounter: Secondary | ICD-10-CM | POA: Diagnosis not present

## 2015-06-04 DIAGNOSIS — G441 Vascular headache, not elsewhere classified: Secondary | ICD-10-CM | POA: Diagnosis not present

## 2015-06-04 DIAGNOSIS — M546 Pain in thoracic spine: Secondary | ICD-10-CM | POA: Diagnosis not present

## 2015-06-04 DIAGNOSIS — M47812 Spondylosis without myelopathy or radiculopathy, cervical region: Secondary | ICD-10-CM | POA: Diagnosis not present

## 2015-06-04 DIAGNOSIS — M4004 Postural kyphosis, thoracic region: Secondary | ICD-10-CM | POA: Diagnosis not present

## 2015-06-06 DIAGNOSIS — M47812 Spondylosis without myelopathy or radiculopathy, cervical region: Secondary | ICD-10-CM | POA: Diagnosis not present

## 2015-06-06 DIAGNOSIS — M9903 Segmental and somatic dysfunction of lumbar region: Secondary | ICD-10-CM | POA: Diagnosis not present

## 2015-06-06 DIAGNOSIS — M9902 Segmental and somatic dysfunction of thoracic region: Secondary | ICD-10-CM | POA: Diagnosis not present

## 2015-06-06 DIAGNOSIS — S233XXA Sprain of ligaments of thoracic spine, initial encounter: Secondary | ICD-10-CM | POA: Diagnosis not present

## 2015-06-06 DIAGNOSIS — M546 Pain in thoracic spine: Secondary | ICD-10-CM | POA: Diagnosis not present

## 2015-06-06 DIAGNOSIS — M531 Cervicobrachial syndrome: Secondary | ICD-10-CM | POA: Diagnosis not present

## 2015-06-06 DIAGNOSIS — G441 Vascular headache, not elsewhere classified: Secondary | ICD-10-CM | POA: Diagnosis not present

## 2015-06-06 DIAGNOSIS — M4004 Postural kyphosis, thoracic region: Secondary | ICD-10-CM | POA: Diagnosis not present

## 2015-06-06 DIAGNOSIS — M9901 Segmental and somatic dysfunction of cervical region: Secondary | ICD-10-CM | POA: Diagnosis not present

## 2015-06-06 DIAGNOSIS — S335XXA Sprain of ligaments of lumbar spine, initial encounter: Secondary | ICD-10-CM | POA: Diagnosis not present

## 2015-06-06 DIAGNOSIS — S134XXA Sprain of ligaments of cervical spine, initial encounter: Secondary | ICD-10-CM | POA: Diagnosis not present

## 2015-06-09 DIAGNOSIS — M546 Pain in thoracic spine: Secondary | ICD-10-CM | POA: Diagnosis not present

## 2015-06-09 DIAGNOSIS — M4004 Postural kyphosis, thoracic region: Secondary | ICD-10-CM | POA: Diagnosis not present

## 2015-06-09 DIAGNOSIS — M47812 Spondylosis without myelopathy or radiculopathy, cervical region: Secondary | ICD-10-CM | POA: Diagnosis not present

## 2015-06-09 DIAGNOSIS — M9902 Segmental and somatic dysfunction of thoracic region: Secondary | ICD-10-CM | POA: Diagnosis not present

## 2015-06-09 DIAGNOSIS — M531 Cervicobrachial syndrome: Secondary | ICD-10-CM | POA: Diagnosis not present

## 2015-06-09 DIAGNOSIS — M9903 Segmental and somatic dysfunction of lumbar region: Secondary | ICD-10-CM | POA: Diagnosis not present

## 2015-06-09 DIAGNOSIS — G441 Vascular headache, not elsewhere classified: Secondary | ICD-10-CM | POA: Diagnosis not present

## 2015-06-09 DIAGNOSIS — S335XXA Sprain of ligaments of lumbar spine, initial encounter: Secondary | ICD-10-CM | POA: Diagnosis not present

## 2015-06-09 DIAGNOSIS — M9901 Segmental and somatic dysfunction of cervical region: Secondary | ICD-10-CM | POA: Diagnosis not present

## 2015-06-09 DIAGNOSIS — S233XXA Sprain of ligaments of thoracic spine, initial encounter: Secondary | ICD-10-CM | POA: Diagnosis not present

## 2015-06-09 DIAGNOSIS — S134XXA Sprain of ligaments of cervical spine, initial encounter: Secondary | ICD-10-CM | POA: Diagnosis not present

## 2015-06-11 DIAGNOSIS — S134XXA Sprain of ligaments of cervical spine, initial encounter: Secondary | ICD-10-CM | POA: Diagnosis not present

## 2015-06-11 DIAGNOSIS — S335XXA Sprain of ligaments of lumbar spine, initial encounter: Secondary | ICD-10-CM | POA: Diagnosis not present

## 2015-06-11 DIAGNOSIS — M531 Cervicobrachial syndrome: Secondary | ICD-10-CM | POA: Diagnosis not present

## 2015-06-11 DIAGNOSIS — M9903 Segmental and somatic dysfunction of lumbar region: Secondary | ICD-10-CM | POA: Diagnosis not present

## 2015-06-11 DIAGNOSIS — M9901 Segmental and somatic dysfunction of cervical region: Secondary | ICD-10-CM | POA: Diagnosis not present

## 2015-06-11 DIAGNOSIS — S233XXA Sprain of ligaments of thoracic spine, initial encounter: Secondary | ICD-10-CM | POA: Diagnosis not present

## 2015-06-11 DIAGNOSIS — G441 Vascular headache, not elsewhere classified: Secondary | ICD-10-CM | POA: Diagnosis not present

## 2015-06-11 DIAGNOSIS — M4004 Postural kyphosis, thoracic region: Secondary | ICD-10-CM | POA: Diagnosis not present

## 2015-06-11 DIAGNOSIS — M47812 Spondylosis without myelopathy or radiculopathy, cervical region: Secondary | ICD-10-CM | POA: Diagnosis not present

## 2015-06-11 DIAGNOSIS — M546 Pain in thoracic spine: Secondary | ICD-10-CM | POA: Diagnosis not present

## 2015-06-11 DIAGNOSIS — M9902 Segmental and somatic dysfunction of thoracic region: Secondary | ICD-10-CM | POA: Diagnosis not present

## 2015-06-16 DIAGNOSIS — S335XXA Sprain of ligaments of lumbar spine, initial encounter: Secondary | ICD-10-CM | POA: Diagnosis not present

## 2015-06-16 DIAGNOSIS — S233XXA Sprain of ligaments of thoracic spine, initial encounter: Secondary | ICD-10-CM | POA: Diagnosis not present

## 2015-06-16 DIAGNOSIS — S134XXA Sprain of ligaments of cervical spine, initial encounter: Secondary | ICD-10-CM | POA: Diagnosis not present

## 2015-06-16 DIAGNOSIS — G441 Vascular headache, not elsewhere classified: Secondary | ICD-10-CM | POA: Diagnosis not present

## 2015-06-16 DIAGNOSIS — M546 Pain in thoracic spine: Secondary | ICD-10-CM | POA: Diagnosis not present

## 2015-06-16 DIAGNOSIS — M47812 Spondylosis without myelopathy or radiculopathy, cervical region: Secondary | ICD-10-CM | POA: Diagnosis not present

## 2015-06-16 DIAGNOSIS — M9902 Segmental and somatic dysfunction of thoracic region: Secondary | ICD-10-CM | POA: Diagnosis not present

## 2015-06-16 DIAGNOSIS — M9901 Segmental and somatic dysfunction of cervical region: Secondary | ICD-10-CM | POA: Diagnosis not present

## 2015-06-16 DIAGNOSIS — M9903 Segmental and somatic dysfunction of lumbar region: Secondary | ICD-10-CM | POA: Diagnosis not present

## 2015-06-16 DIAGNOSIS — M4004 Postural kyphosis, thoracic region: Secondary | ICD-10-CM | POA: Diagnosis not present

## 2015-06-16 DIAGNOSIS — M531 Cervicobrachial syndrome: Secondary | ICD-10-CM | POA: Diagnosis not present

## 2015-06-18 DIAGNOSIS — M47812 Spondylosis without myelopathy or radiculopathy, cervical region: Secondary | ICD-10-CM | POA: Diagnosis not present

## 2015-06-18 DIAGNOSIS — M531 Cervicobrachial syndrome: Secondary | ICD-10-CM | POA: Diagnosis not present

## 2015-06-18 DIAGNOSIS — M9903 Segmental and somatic dysfunction of lumbar region: Secondary | ICD-10-CM | POA: Diagnosis not present

## 2015-06-18 DIAGNOSIS — M4004 Postural kyphosis, thoracic region: Secondary | ICD-10-CM | POA: Diagnosis not present

## 2015-06-18 DIAGNOSIS — M546 Pain in thoracic spine: Secondary | ICD-10-CM | POA: Diagnosis not present

## 2015-06-18 DIAGNOSIS — M9901 Segmental and somatic dysfunction of cervical region: Secondary | ICD-10-CM | POA: Diagnosis not present

## 2015-06-18 DIAGNOSIS — S335XXA Sprain of ligaments of lumbar spine, initial encounter: Secondary | ICD-10-CM | POA: Diagnosis not present

## 2015-06-18 DIAGNOSIS — G441 Vascular headache, not elsewhere classified: Secondary | ICD-10-CM | POA: Diagnosis not present

## 2015-06-18 DIAGNOSIS — S233XXA Sprain of ligaments of thoracic spine, initial encounter: Secondary | ICD-10-CM | POA: Diagnosis not present

## 2015-06-18 DIAGNOSIS — S134XXA Sprain of ligaments of cervical spine, initial encounter: Secondary | ICD-10-CM | POA: Diagnosis not present

## 2015-06-18 DIAGNOSIS — M9902 Segmental and somatic dysfunction of thoracic region: Secondary | ICD-10-CM | POA: Diagnosis not present

## 2015-06-23 DIAGNOSIS — M9902 Segmental and somatic dysfunction of thoracic region: Secondary | ICD-10-CM | POA: Diagnosis not present

## 2015-06-23 DIAGNOSIS — G441 Vascular headache, not elsewhere classified: Secondary | ICD-10-CM | POA: Diagnosis not present

## 2015-06-23 DIAGNOSIS — S134XXA Sprain of ligaments of cervical spine, initial encounter: Secondary | ICD-10-CM | POA: Diagnosis not present

## 2015-06-23 DIAGNOSIS — M47812 Spondylosis without myelopathy or radiculopathy, cervical region: Secondary | ICD-10-CM | POA: Diagnosis not present

## 2015-06-23 DIAGNOSIS — M4004 Postural kyphosis, thoracic region: Secondary | ICD-10-CM | POA: Diagnosis not present

## 2015-06-23 DIAGNOSIS — M9903 Segmental and somatic dysfunction of lumbar region: Secondary | ICD-10-CM | POA: Diagnosis not present

## 2015-06-23 DIAGNOSIS — M546 Pain in thoracic spine: Secondary | ICD-10-CM | POA: Diagnosis not present

## 2015-06-23 DIAGNOSIS — S233XXA Sprain of ligaments of thoracic spine, initial encounter: Secondary | ICD-10-CM | POA: Diagnosis not present

## 2015-06-23 DIAGNOSIS — S335XXA Sprain of ligaments of lumbar spine, initial encounter: Secondary | ICD-10-CM | POA: Diagnosis not present

## 2015-06-23 DIAGNOSIS — M531 Cervicobrachial syndrome: Secondary | ICD-10-CM | POA: Diagnosis not present

## 2015-06-23 DIAGNOSIS — M9901 Segmental and somatic dysfunction of cervical region: Secondary | ICD-10-CM | POA: Diagnosis not present

## 2015-06-24 DIAGNOSIS — F4320 Adjustment disorder, unspecified: Secondary | ICD-10-CM | POA: Diagnosis not present

## 2015-06-24 DIAGNOSIS — Z1389 Encounter for screening for other disorder: Secondary | ICD-10-CM | POA: Diagnosis not present

## 2015-06-24 DIAGNOSIS — Z6824 Body mass index (BMI) 24.0-24.9, adult: Secondary | ICD-10-CM | POA: Diagnosis not present

## 2015-06-24 DIAGNOSIS — J301 Allergic rhinitis due to pollen: Secondary | ICD-10-CM | POA: Diagnosis not present

## 2015-07-01 DIAGNOSIS — M9901 Segmental and somatic dysfunction of cervical region: Secondary | ICD-10-CM | POA: Diagnosis not present

## 2015-07-01 DIAGNOSIS — G441 Vascular headache, not elsewhere classified: Secondary | ICD-10-CM | POA: Diagnosis not present

## 2015-07-01 DIAGNOSIS — S134XXA Sprain of ligaments of cervical spine, initial encounter: Secondary | ICD-10-CM | POA: Diagnosis not present

## 2015-07-01 DIAGNOSIS — S335XXA Sprain of ligaments of lumbar spine, initial encounter: Secondary | ICD-10-CM | POA: Diagnosis not present

## 2015-07-01 DIAGNOSIS — S233XXA Sprain of ligaments of thoracic spine, initial encounter: Secondary | ICD-10-CM | POA: Diagnosis not present

## 2015-07-01 DIAGNOSIS — M9903 Segmental and somatic dysfunction of lumbar region: Secondary | ICD-10-CM | POA: Diagnosis not present

## 2015-07-01 DIAGNOSIS — M546 Pain in thoracic spine: Secondary | ICD-10-CM | POA: Diagnosis not present

## 2015-07-01 DIAGNOSIS — M47812 Spondylosis without myelopathy or radiculopathy, cervical region: Secondary | ICD-10-CM | POA: Diagnosis not present

## 2015-07-01 DIAGNOSIS — M4004 Postural kyphosis, thoracic region: Secondary | ICD-10-CM | POA: Diagnosis not present

## 2015-07-01 DIAGNOSIS — M9902 Segmental and somatic dysfunction of thoracic region: Secondary | ICD-10-CM | POA: Diagnosis not present

## 2015-07-01 DIAGNOSIS — M531 Cervicobrachial syndrome: Secondary | ICD-10-CM | POA: Diagnosis not present

## 2015-07-15 DIAGNOSIS — M9901 Segmental and somatic dysfunction of cervical region: Secondary | ICD-10-CM | POA: Diagnosis not present

## 2015-07-15 DIAGNOSIS — M9902 Segmental and somatic dysfunction of thoracic region: Secondary | ICD-10-CM | POA: Diagnosis not present

## 2015-07-15 DIAGNOSIS — M9903 Segmental and somatic dysfunction of lumbar region: Secondary | ICD-10-CM | POA: Diagnosis not present

## 2015-07-15 DIAGNOSIS — M47812 Spondylosis without myelopathy or radiculopathy, cervical region: Secondary | ICD-10-CM | POA: Diagnosis not present

## 2015-07-15 DIAGNOSIS — G441 Vascular headache, not elsewhere classified: Secondary | ICD-10-CM | POA: Diagnosis not present

## 2015-07-15 DIAGNOSIS — S335XXA Sprain of ligaments of lumbar spine, initial encounter: Secondary | ICD-10-CM | POA: Diagnosis not present

## 2015-07-15 DIAGNOSIS — S233XXA Sprain of ligaments of thoracic spine, initial encounter: Secondary | ICD-10-CM | POA: Diagnosis not present

## 2015-07-15 DIAGNOSIS — M4004 Postural kyphosis, thoracic region: Secondary | ICD-10-CM | POA: Diagnosis not present

## 2015-07-15 DIAGNOSIS — S134XXA Sprain of ligaments of cervical spine, initial encounter: Secondary | ICD-10-CM | POA: Diagnosis not present

## 2015-07-15 DIAGNOSIS — M531 Cervicobrachial syndrome: Secondary | ICD-10-CM | POA: Diagnosis not present

## 2015-07-15 DIAGNOSIS — M546 Pain in thoracic spine: Secondary | ICD-10-CM | POA: Diagnosis not present

## 2015-07-29 DIAGNOSIS — M531 Cervicobrachial syndrome: Secondary | ICD-10-CM | POA: Diagnosis not present

## 2015-07-29 DIAGNOSIS — M4004 Postural kyphosis, thoracic region: Secondary | ICD-10-CM | POA: Diagnosis not present

## 2015-07-29 DIAGNOSIS — S233XXA Sprain of ligaments of thoracic spine, initial encounter: Secondary | ICD-10-CM | POA: Diagnosis not present

## 2015-07-29 DIAGNOSIS — M47812 Spondylosis without myelopathy or radiculopathy, cervical region: Secondary | ICD-10-CM | POA: Diagnosis not present

## 2015-07-29 DIAGNOSIS — M546 Pain in thoracic spine: Secondary | ICD-10-CM | POA: Diagnosis not present

## 2015-07-29 DIAGNOSIS — M9901 Segmental and somatic dysfunction of cervical region: Secondary | ICD-10-CM | POA: Diagnosis not present

## 2015-07-29 DIAGNOSIS — M9902 Segmental and somatic dysfunction of thoracic region: Secondary | ICD-10-CM | POA: Diagnosis not present

## 2015-07-29 DIAGNOSIS — G441 Vascular headache, not elsewhere classified: Secondary | ICD-10-CM | POA: Diagnosis not present

## 2015-07-29 DIAGNOSIS — M9903 Segmental and somatic dysfunction of lumbar region: Secondary | ICD-10-CM | POA: Diagnosis not present

## 2015-07-29 DIAGNOSIS — S335XXA Sprain of ligaments of lumbar spine, initial encounter: Secondary | ICD-10-CM | POA: Diagnosis not present

## 2015-07-29 DIAGNOSIS — S134XXA Sprain of ligaments of cervical spine, initial encounter: Secondary | ICD-10-CM | POA: Diagnosis not present

## 2015-08-04 DIAGNOSIS — K121 Other forms of stomatitis: Secondary | ICD-10-CM | POA: Diagnosis not present

## 2015-08-12 DIAGNOSIS — M47812 Spondylosis without myelopathy or radiculopathy, cervical region: Secondary | ICD-10-CM | POA: Diagnosis not present

## 2015-08-12 DIAGNOSIS — S134XXA Sprain of ligaments of cervical spine, initial encounter: Secondary | ICD-10-CM | POA: Diagnosis not present

## 2015-08-12 DIAGNOSIS — M9901 Segmental and somatic dysfunction of cervical region: Secondary | ICD-10-CM | POA: Diagnosis not present

## 2015-08-13 DIAGNOSIS — T783XXA Angioneurotic edema, initial encounter: Secondary | ICD-10-CM | POA: Diagnosis not present

## 2015-08-13 DIAGNOSIS — Z6824 Body mass index (BMI) 24.0-24.9, adult: Secondary | ICD-10-CM | POA: Diagnosis not present

## 2015-08-13 DIAGNOSIS — Z1389 Encounter for screening for other disorder: Secondary | ICD-10-CM | POA: Diagnosis not present

## 2015-09-02 DIAGNOSIS — M9901 Segmental and somatic dysfunction of cervical region: Secondary | ICD-10-CM | POA: Diagnosis not present

## 2015-09-02 DIAGNOSIS — S134XXA Sprain of ligaments of cervical spine, initial encounter: Secondary | ICD-10-CM | POA: Diagnosis not present

## 2015-09-02 DIAGNOSIS — M47812 Spondylosis without myelopathy or radiculopathy, cervical region: Secondary | ICD-10-CM | POA: Diagnosis not present

## 2015-09-12 DIAGNOSIS — K1379 Other lesions of oral mucosa: Secondary | ICD-10-CM | POA: Diagnosis not present

## 2015-09-20 DIAGNOSIS — T1511XA Foreign body in conjunctival sac, right eye, initial encounter: Secondary | ICD-10-CM | POA: Diagnosis not present

## 2015-09-22 DIAGNOSIS — K1379 Other lesions of oral mucosa: Secondary | ICD-10-CM | POA: Diagnosis not present

## 2015-09-24 DIAGNOSIS — Z23 Encounter for immunization: Secondary | ICD-10-CM | POA: Diagnosis not present

## 2015-09-30 DIAGNOSIS — M531 Cervicobrachial syndrome: Secondary | ICD-10-CM | POA: Diagnosis not present

## 2015-09-30 DIAGNOSIS — M47812 Spondylosis without myelopathy or radiculopathy, cervical region: Secondary | ICD-10-CM | POA: Diagnosis not present

## 2015-09-30 DIAGNOSIS — M9901 Segmental and somatic dysfunction of cervical region: Secondary | ICD-10-CM | POA: Diagnosis not present

## 2015-09-30 DIAGNOSIS — S134XXA Sprain of ligaments of cervical spine, initial encounter: Secondary | ICD-10-CM | POA: Diagnosis not present

## 2015-10-17 DIAGNOSIS — Z124 Encounter for screening for malignant neoplasm of cervix: Secondary | ICD-10-CM | POA: Diagnosis not present

## 2015-10-17 DIAGNOSIS — Z6826 Body mass index (BMI) 26.0-26.9, adult: Secondary | ICD-10-CM | POA: Diagnosis not present

## 2015-10-27 DIAGNOSIS — K1239 Other oral mucositis (ulcerative): Secondary | ICD-10-CM | POA: Diagnosis not present

## 2015-10-27 DIAGNOSIS — Z6825 Body mass index (BMI) 25.0-25.9, adult: Secondary | ICD-10-CM | POA: Diagnosis not present

## 2015-10-27 DIAGNOSIS — Z1389 Encounter for screening for other disorder: Secondary | ICD-10-CM | POA: Diagnosis not present

## 2015-10-28 DIAGNOSIS — Z1389 Encounter for screening for other disorder: Secondary | ICD-10-CM | POA: Diagnosis not present

## 2015-10-28 DIAGNOSIS — K1239 Other oral mucositis (ulcerative): Secondary | ICD-10-CM | POA: Diagnosis not present

## 2015-10-28 DIAGNOSIS — M47812 Spondylosis without myelopathy or radiculopathy, cervical region: Secondary | ICD-10-CM | POA: Diagnosis not present

## 2015-10-28 DIAGNOSIS — M9901 Segmental and somatic dysfunction of cervical region: Secondary | ICD-10-CM | POA: Diagnosis not present

## 2015-10-29 DIAGNOSIS — D225 Melanocytic nevi of trunk: Secondary | ICD-10-CM | POA: Diagnosis not present

## 2015-10-29 DIAGNOSIS — Z1283 Encounter for screening for malignant neoplasm of skin: Secondary | ICD-10-CM | POA: Diagnosis not present

## 2015-10-29 DIAGNOSIS — D2272 Melanocytic nevi of left lower limb, including hip: Secondary | ICD-10-CM | POA: Diagnosis not present

## 2015-10-29 DIAGNOSIS — D485 Neoplasm of uncertain behavior of skin: Secondary | ICD-10-CM | POA: Diagnosis not present

## 2015-10-29 DIAGNOSIS — L570 Actinic keratosis: Secondary | ICD-10-CM | POA: Diagnosis not present

## 2015-10-29 DIAGNOSIS — X32XXXA Exposure to sunlight, initial encounter: Secondary | ICD-10-CM | POA: Diagnosis not present

## 2015-10-29 DIAGNOSIS — Z1231 Encounter for screening mammogram for malignant neoplasm of breast: Secondary | ICD-10-CM | POA: Diagnosis not present

## 2015-11-04 ENCOUNTER — Ambulatory Visit (INDEPENDENT_AMBULATORY_CARE_PROVIDER_SITE_OTHER): Payer: PPO | Admitting: Allergy and Immunology

## 2015-11-04 ENCOUNTER — Encounter: Payer: Self-pay | Admitting: Allergy and Immunology

## 2015-11-04 VITALS — BP 148/66 | HR 86 | Temp 97.5°F | Resp 16 | Ht 61.73 in | Wt 144.4 lb

## 2015-11-04 DIAGNOSIS — K1379 Other lesions of oral mucosa: Secondary | ICD-10-CM

## 2015-11-04 DIAGNOSIS — J310 Chronic rhinitis: Secondary | ICD-10-CM | POA: Diagnosis not present

## 2015-11-04 NOTE — Patient Instructions (Signed)
Take Home Sheet  1. Avoidance: Mite and Mold and Dog   2. Antihistamine: Zyrtec 10mg  by mouth once daily for runny nose or itching.   3. Nasal Spray: Saline 2 spray(s) each nostril twice daily for stuffy nose or drainage.   4. Obtain copy of recent labwork for our review.    And may consider further laboratory evaluation.  5.  Consider trial off ASA products and all NSAIDS.  6. Continue to Decrease cig use and choose quit date.  7.  Follow up Visit: 3 months or sooner if needed.   Websites that have reliable Patient information: 1. American Academy of Asthma, Allergy, & Immunology: www.aaaai.org 2. Food Allergy Network: www.foodallergy.org 3. Mothers of Asthmatics: www.aanma.org 4. [place]: DiningCalendar.de 5. American College of Allergy, Asthma, & Immunology: https://robertson.info/ or www.acaai.org

## 2015-11-04 NOTE — Progress Notes (Signed)
NEW PATIENT NOTE  RE: MONTRESSA PFIFFNER MRN: [id number] DOB: Aug 15, 1941 [facility]. [address] [place], [place] [address] Date of Office Visit: 11/04/2015  Referring provider: Tamela Oddi, DDS [address] [place], [place] [address]  Subjective:  Mary Hart is a 75 y.o. female who presents today for mouth irritations.  Assessment:   1. Mixed rhinitis, very mild aeroallergen hypersensitivity.    2. Mouth sores, unclear etiology, now improved status post systemic steroids--pathology report consistent with ulcerative mucositis.  (October 2016).  3.      Negative selective food testing. 4.      History of GERD as followed by gastroenterology. 5.      Chronic tobacco use. Plan:   Patient Instructions  1. Avoidance: Mite and Mold and Dog 2. Antihistamine: Zyrtec 10mg  by mouth once daily for runny nose or itching. 3. Nasal Spray: Saline 2 spray(s) each nostril twice daily for stuffy nose or drainage.  4. Obtain copy of recent labwork for our review.   And may consider further laboratory evaluation. 5.  Consider trial off ASA products and all NSAIDS. 6.  Continue to Decrease cig use and choose quit date 7.  Follow up Visit: 3 months or sooner if needed-- will monitor closely for any further episodes--given current resolution.  HPI: [place], a long-time smoker presents with a mild history of spring and fall rhinorrhea, congestion, sneezing and postnasal drip.  Typically pollen, outdoor and fluctuant weather patterns are provoking factors to symptoms which improve with intermittent antihistamine use.  She denies cough and wheeze, however, has been most concerned about mouth and lip irritations since October 2016.  She describes initially an irritated, painful, skin change at upper lip with outer redness and interior possible ulceration which improves with Benadryl or a swish and gargle management.  She saw her primary M.D. who gave cortisone  injection and noted improvement, but worsened again and therefore saw her dentist who prescribed additional course of prednisone, intermittent Benadryl and recommended secondary oral maxillary evaluation, who completed a biopsy.  She describes currently inside upper lip feels rough and irritated but no further lesions or any swelling and has been off prednisone for the last month.  She finds no specific foods as clear triggers, but citrus (OJ) spicier/ tomato-based foods are irritating.  She denies any known food allergy and no other irritation/ulcers or lesions--in nares or vaginally.  She denies difficulty in breathing or dysphagia.  Over the last 2 years, she does find dry, foods hard to swallow and has had an esophageal stretch.  She reports recent normal labs including an ANA and recollection of tick bites.  Denies ED or Urgent care visits or antibiotic courses.  Medical History: Past Medical History  Diagnosis Date  . GERD (gastroesophageal reflux disease)   . Hypothyroid   . Arthritis   . Neuropathy ([place])   . Depression   . IBS (irritable bowel syndrome)   . Clostridium difficile infection     treated with vancomycin  . Internal hemorrhoids   . Diverticula of colon     minimal   Surgical History: Past Surgical History  Procedure Laterality Date  . Colonoscopy  06/20/2008    [place] Dr. Wynelle Bourgeois external hemorrhoids  . Anterior cervical fusion x 2    . Joint replacement  09/2010    Thumb  . Vesicovaginal fistula closure w/ tah    . Cholecystectomy    . Esophagogastroduodenoscopy  11/25/10  [facility]- tubular esophagus s/p of 56 dilator from a small HH/otherwise normal  . Abdominal hysterectomy    . Colonoscopy  10/12/2012    Dr. Gala Romney- internal hemorrhoids, very early, few, sigmoid diverticula o/w normal appearing colon and terminal ileum. + cdiff on stool samples taken. Segmental bx negative.   Family History: Family History  Problem Relation Age of Onset  . Colon cancer  Maternal Uncle   . Dementia Father   . Dementia Mother   . Breast cancer Mother   . Allergic rhinitis Neg Hx   . Asthma Neg Hx   . Eczema Neg Hx   . Urticaria Neg Hx    Social History: Social History  . Marital Status: Married    Spouse Name: N/A  . Number of Children: 4  . Years of Education: N/A   Occupational History  . Retired    Social History Main Topics  . Smoking status: Current Every Day Smoker -- 0.50 packs/day for 50 years    Types: Cigarettes  . Smokeless tobacco: Never Used     Comment: 1/2 pack per day  . Alcohol Use: Yes  . Drug Use: No  . Sexual Activity: Not on file   Social History Narrative   Married, 2 sons and 2 daughters   Husband chronically ill and she helps with care   She is retired   Medications prior to this encounter: Outpatient Prescriptions Prior to Visit  Medication Sig Dispense Refill  . ALPRAZolam (XANAX) 0.25 MG tablet Take 1 tablet by mouth 3 (three) times daily as needed for anxiety.     . Aspirin-Salicylamide-Caffeine (BC HEADACHE POWDER PO) Take 1 Package by mouth 3 (three) times daily as needed (pain/headaches).    . cetirizine (ZYRTEC) 10 MG tablet Take 10 mg by mouth at bedtime.    Marland Kitchen levothyroxine (SYNTHROID, LEVOTHROID) 25 MCG tablet Take 25 mcg by mouth daily.      Marland Kitchen saccharomyces boulardii (FLORASTOR) 250 MG capsule Take 250 mg by mouth daily.    . Simethicone (GAS-X PO) Take 1 capsule by mouth daily as needed (gas relief).     . citalopram (CELEXA) 20 MG tablet Take 20 mg by mouth daily. Reported on 11/04/2015     No facility-administered medications prior to visit.   Drug Allergies: Allergies  Allergen Reactions  . Codeine Nausea And Vomiting  . Hydromorphone Hcl Itching  . Morphine Nausea And Vomiting  . Moxifloxacin     N/V/D, skin red. Tolerates Levaquin and Cipro.   . Sulfonamide Derivatives Other (See Comments)    "almost died" "pulse went down to 12"   Environmental History: Eryn lives in a 73 year old  house 29 years, which is carpeted throughout with Central air and heat, bedroom humidifier, indoor dog.  Stuffed mattress non-feather pillow and comforter.  Review of Systems  Constitutional: Negative for fever, weight loss and malaise/fatigue.  HENT: Positive for congestion. Negative for ear pain, hearing loss, nosebleeds and sore throat.   Eyes: Negative for discharge and redness.  Respiratory: Negative for shortness of breath.        Denies history of bronchitis and pneumonia.  Gastrointestinal: Negative for heartburn, nausea, vomiting, abdominal pain, diarrhea and constipation.  Genitourinary: Negative.   Musculoskeletal: Negative for myalgias and joint pain.  Skin: Negative.  Negative for itching and rash.  Neurological: Negative.  Negative for dizziness, seizures, weakness and headaches.  Endo/Heme/Allergies: Positive for environmental allergies.       Denies sensitivity to aspirin, NSAIDs, stinging insects, latex, jewelry  and cosmetics.   Objective:   Filed Vitals:   11/04/15 1427  BP: 148/66  Pulse: 86  Temp: 97.5 F (36.4 C)  Resp: 16   SpO2 Readings from Last 1 Encounters:  11/04/15 98%   Physical Exam  Constitutional: She is well-developed, well-nourished, and in no distress.  HENT:  Head: Atraumatic.  Right Ear: Tympanic membrane and ear canal normal.  Left Ear: Tympanic membrane and ear canal normal.  Nose: Mucosal edema present. No rhinorrhea. No epistaxis.  Mouth/Throat: Oropharynx is clear and moist and mucous membranes are normal. No oral lesions. No uvula swelling or lacerations. No oropharyngeal exudate, posterior oropharyngeal edema or posterior oropharyngeal erythema.  Eyes: Conjunctivae are normal.  Neck: Neck supple.  Cardiovascular: Normal rate, S1 normal and S2 normal.   No murmur heard. Pulmonary/Chest: Effort normal. She has no wheezes. She has no rhonchi. She has no rales.  Abdominal: Soft. Normal appearance and bowel sounds are normal.    Musculoskeletal: She exhibits no edema.  Lymphadenopathy:    She has no cervical adenopathy.  Neurological: She is alert.  Skin: Skin is warm and intact. No rash noted. No cyanosis. Nails show no clubbing.   Diagnostics: Mild reactivity to selected mold species, dog epithelial with minimal reactivity to dust mite and negative selective food testing.    Shaneeka Scarboro M. Ishmael Holter, MD   cc: Jana Half

## 2015-11-25 DIAGNOSIS — M47812 Spondylosis without myelopathy or radiculopathy, cervical region: Secondary | ICD-10-CM | POA: Diagnosis not present

## 2015-11-25 DIAGNOSIS — S233XXA Sprain of ligaments of thoracic spine, initial encounter: Secondary | ICD-10-CM | POA: Diagnosis not present

## 2015-11-25 DIAGNOSIS — M9901 Segmental and somatic dysfunction of cervical region: Secondary | ICD-10-CM | POA: Diagnosis not present

## 2015-12-01 DIAGNOSIS — K121 Other forms of stomatitis: Secondary | ICD-10-CM | POA: Diagnosis not present

## 2015-12-01 DIAGNOSIS — M1990 Unspecified osteoarthritis, unspecified site: Secondary | ICD-10-CM | POA: Diagnosis not present

## 2015-12-01 DIAGNOSIS — M3500 Sicca syndrome, unspecified: Secondary | ICD-10-CM | POA: Diagnosis not present

## 2015-12-01 DIAGNOSIS — M150 Primary generalized (osteo)arthritis: Secondary | ICD-10-CM | POA: Diagnosis not present

## 2015-12-24 ENCOUNTER — Telehealth: Payer: Self-pay | Admitting: Internal Medicine

## 2015-12-24 NOTE — Telephone Encounter (Signed)
Patient will find out what antibiotic they plan to put her on and call back

## 2015-12-24 NOTE — Telephone Encounter (Signed)
patient states that she had a root canal yesterday and the dentist thinks that she may need an antibiotic. she says that Dr. Carlean Purl has told her that he doesn't want her to take any antibiotics since she has had C diff several times. She is wanting to know if it will ok for her to take the antibiotic.

## 2015-12-25 MED ORDER — SACCHAROMYCES BOULARDII 250 MG PO CAPS: 250.0000 mg | ORAL_CAPSULE | Freq: Two times a day (BID) | ORAL | Status: DC

## 2015-12-25 NOTE — Telephone Encounter (Signed)
Beth with Dr. Elgie Collard with [facility] of recommendations Patient notified of recommendations for florastor and rx sent

## 2015-12-25 NOTE — Telephone Encounter (Signed)
If she needs to treat the infection then amoxicillin ok and take Florastor 250 bid with it

## 2015-12-25 NOTE — Telephone Encounter (Signed)
Patient's dentist called this am.  They need to put her on amoxicillin 500 mg QID for 7 days for dental infection.  Is this ok with her history of c-diff?

## 2016-01-01 ENCOUNTER — Telehealth: Payer: Self-pay | Admitting: Internal Medicine

## 2016-01-01 NOTE — Telephone Encounter (Signed)
Patient was sent with a rx incase her swelling does not go down. At this time they do not plan on her needing another antibiotic.  See previous phone note from 12/24/15.  Mary Hart advised that if Dr. Loyal Gambler feels she needs antibiotics it is fine per Dr. Carlean Purl

## 2016-01-06 DIAGNOSIS — M9901 Segmental and somatic dysfunction of cervical region: Secondary | ICD-10-CM | POA: Diagnosis not present

## 2016-01-06 DIAGNOSIS — S233XXA Sprain of ligaments of thoracic spine, initial encounter: Secondary | ICD-10-CM | POA: Diagnosis not present

## 2016-01-06 DIAGNOSIS — M47812 Spondylosis without myelopathy or radiculopathy, cervical region: Secondary | ICD-10-CM | POA: Diagnosis not present

## 2016-01-08 DIAGNOSIS — R490 Dysphonia: Secondary | ICD-10-CM | POA: Diagnosis not present

## 2016-01-08 DIAGNOSIS — E663 Overweight: Secondary | ICD-10-CM | POA: Diagnosis not present

## 2016-01-08 DIAGNOSIS — R1310 Dysphagia, unspecified: Secondary | ICD-10-CM | POA: Diagnosis not present

## 2016-01-08 DIAGNOSIS — Z1389 Encounter for screening for other disorder: Secondary | ICD-10-CM | POA: Diagnosis not present

## 2016-01-08 DIAGNOSIS — F4320 Adjustment disorder, unspecified: Secondary | ICD-10-CM | POA: Diagnosis not present

## 2016-01-08 DIAGNOSIS — Z6826 Body mass index (BMI) 26.0-26.9, adult: Secondary | ICD-10-CM | POA: Diagnosis not present

## 2016-02-03 ENCOUNTER — Ambulatory Visit: Payer: PPO | Admitting: Allergy and Immunology

## 2016-02-03 DIAGNOSIS — S233XXA Sprain of ligaments of thoracic spine, initial encounter: Secondary | ICD-10-CM | POA: Diagnosis not present

## 2016-02-03 DIAGNOSIS — M47812 Spondylosis without myelopathy or radiculopathy, cervical region: Secondary | ICD-10-CM | POA: Diagnosis not present

## 2016-02-03 DIAGNOSIS — M9901 Segmental and somatic dysfunction of cervical region: Secondary | ICD-10-CM | POA: Diagnosis not present

## 2016-02-09 ENCOUNTER — Telehealth: Payer: Self-pay | Admitting: Internal Medicine

## 2016-02-09 NOTE — Telephone Encounter (Signed)
Patient reports bloating, abdominal discomfort, HA, weakness, and generally doesn't feel well for the last week.  She denies diarrhea, infact had to take a laxative yesterday.  She would like to be tested for c-diff.  She is advised that can't test for c-diff with solid stools. She requests recommendations for her symptoms.  She is advised to see her primary care for her symptoms and she can be referred back if they feel any other symptoms are GI related.

## 2016-02-16 ENCOUNTER — Other Ambulatory Visit (INDEPENDENT_AMBULATORY_CARE_PROVIDER_SITE_OTHER): Payer: Self-pay | Admitting: Otolaryngology

## 2016-02-16 DIAGNOSIS — J342 Deviated nasal septum: Secondary | ICD-10-CM | POA: Diagnosis not present

## 2016-02-16 DIAGNOSIS — J329 Chronic sinusitis, unspecified: Secondary | ICD-10-CM

## 2016-02-16 DIAGNOSIS — J320 Chronic maxillary sinusitis: Secondary | ICD-10-CM | POA: Diagnosis not present

## 2016-02-16 DIAGNOSIS — J310 Chronic rhinitis: Secondary | ICD-10-CM | POA: Diagnosis not present

## 2016-02-16 DIAGNOSIS — R490 Dysphonia: Secondary | ICD-10-CM | POA: Diagnosis not present

## 2016-02-17 DIAGNOSIS — H04201 Unspecified epiphora, right lacrimal gland: Secondary | ICD-10-CM | POA: Diagnosis not present

## 2016-02-17 DIAGNOSIS — H524 Presbyopia: Secondary | ICD-10-CM | POA: Diagnosis not present

## 2016-02-17 DIAGNOSIS — Z961 Presence of intraocular lens: Secondary | ICD-10-CM | POA: Diagnosis not present

## 2016-02-20 ENCOUNTER — Ambulatory Visit (HOSPITAL_COMMUNITY): Payer: PPO

## 2016-02-25 DIAGNOSIS — M3500 Sicca syndrome, unspecified: Secondary | ICD-10-CM | POA: Diagnosis not present

## 2016-02-25 DIAGNOSIS — R350 Frequency of micturition: Secondary | ICD-10-CM | POA: Diagnosis not present

## 2016-02-25 DIAGNOSIS — K121 Other forms of stomatitis: Secondary | ICD-10-CM | POA: Diagnosis not present

## 2016-02-25 DIAGNOSIS — M1990 Unspecified osteoarthritis, unspecified site: Secondary | ICD-10-CM | POA: Diagnosis not present

## 2016-02-25 DIAGNOSIS — M150 Primary generalized (osteo)arthritis: Secondary | ICD-10-CM | POA: Diagnosis not present

## 2016-02-25 DIAGNOSIS — M2550 Pain in unspecified joint: Secondary | ICD-10-CM | POA: Diagnosis not present

## 2016-02-26 ENCOUNTER — Ambulatory Visit (HOSPITAL_COMMUNITY)
Admission: RE | Admit: 2016-02-26 | Discharge: 2016-02-26 | Disposition: A | Discharge status: Home / Self Care | Payer: PPO | Source: Ambulatory Visit | Attending: Otolaryngology | Admitting: Otolaryngology

## 2016-02-26 DIAGNOSIS — J329 Chronic sinusitis, unspecified: Secondary | ICD-10-CM | POA: Diagnosis not present

## 2016-02-26 DIAGNOSIS — J3489 Other specified disorders of nose and nasal sinuses: Secondary | ICD-10-CM | POA: Diagnosis not present

## 2016-03-03 DIAGNOSIS — M47812 Spondylosis without myelopathy or radiculopathy, cervical region: Secondary | ICD-10-CM | POA: Diagnosis not present

## 2016-03-03 DIAGNOSIS — M9901 Segmental and somatic dysfunction of cervical region: Secondary | ICD-10-CM | POA: Diagnosis not present

## 2016-03-03 DIAGNOSIS — S233XXA Sprain of ligaments of thoracic spine, initial encounter: Secondary | ICD-10-CM | POA: Diagnosis not present

## 2016-03-12 DIAGNOSIS — Z6825 Body mass index (BMI) 25.0-25.9, adult: Secondary | ICD-10-CM | POA: Diagnosis not present

## 2016-03-12 DIAGNOSIS — Z0000 Encounter for general adult medical examination without abnormal findings: Secondary | ICD-10-CM | POA: Diagnosis not present

## 2016-03-12 DIAGNOSIS — Z1389 Encounter for screening for other disorder: Secondary | ICD-10-CM | POA: Diagnosis not present

## 2016-03-17 DIAGNOSIS — J342 Deviated nasal septum: Secondary | ICD-10-CM | POA: Diagnosis not present

## 2016-03-17 DIAGNOSIS — J310 Chronic rhinitis: Secondary | ICD-10-CM | POA: Diagnosis not present

## 2016-03-17 DIAGNOSIS — J343 Hypertrophy of nasal turbinates: Secondary | ICD-10-CM | POA: Diagnosis not present

## 2016-03-31 DIAGNOSIS — M47812 Spondylosis without myelopathy or radiculopathy, cervical region: Secondary | ICD-10-CM | POA: Diagnosis not present

## 2016-03-31 DIAGNOSIS — M9901 Segmental and somatic dysfunction of cervical region: Secondary | ICD-10-CM | POA: Diagnosis not present

## 2016-03-31 DIAGNOSIS — S233XXA Sprain of ligaments of thoracic spine, initial encounter: Secondary | ICD-10-CM | POA: Diagnosis not present

## 2016-04-07 DIAGNOSIS — Z6824 Body mass index (BMI) 24.0-24.9, adult: Secondary | ICD-10-CM | POA: Diagnosis not present

## 2016-04-07 DIAGNOSIS — J069 Acute upper respiratory infection, unspecified: Secondary | ICD-10-CM | POA: Diagnosis not present

## 2016-04-07 DIAGNOSIS — J343 Hypertrophy of nasal turbinates: Secondary | ICD-10-CM | POA: Diagnosis not present

## 2016-04-07 DIAGNOSIS — K219 Gastro-esophageal reflux disease without esophagitis: Secondary | ICD-10-CM | POA: Diagnosis not present

## 2016-04-07 DIAGNOSIS — R070 Pain in throat: Secondary | ICD-10-CM | POA: Diagnosis not present

## 2016-04-07 DIAGNOSIS — Z1389 Encounter for screening for other disorder: Secondary | ICD-10-CM | POA: Diagnosis not present

## 2016-04-20 DIAGNOSIS — J343 Hypertrophy of nasal turbinates: Secondary | ICD-10-CM | POA: Diagnosis not present

## 2016-04-20 DIAGNOSIS — J310 Chronic rhinitis: Secondary | ICD-10-CM | POA: Diagnosis not present

## 2016-04-20 DIAGNOSIS — J342 Deviated nasal septum: Secondary | ICD-10-CM | POA: Diagnosis not present

## 2016-04-28 DIAGNOSIS — M9901 Segmental and somatic dysfunction of cervical region: Secondary | ICD-10-CM | POA: Diagnosis not present

## 2016-04-28 DIAGNOSIS — M47812 Spondylosis without myelopathy or radiculopathy, cervical region: Secondary | ICD-10-CM | POA: Diagnosis not present

## 2016-04-28 DIAGNOSIS — S233XXA Sprain of ligaments of thoracic spine, initial encounter: Secondary | ICD-10-CM | POA: Diagnosis not present

## 2016-05-04 DIAGNOSIS — J343 Hypertrophy of nasal turbinates: Secondary | ICD-10-CM | POA: Diagnosis not present

## 2016-05-04 DIAGNOSIS — J069 Acute upper respiratory infection, unspecified: Secondary | ICD-10-CM | POA: Diagnosis not present

## 2016-05-04 DIAGNOSIS — E669 Obesity, unspecified: Secondary | ICD-10-CM | POA: Diagnosis not present

## 2016-05-04 DIAGNOSIS — J302 Other seasonal allergic rhinitis: Secondary | ICD-10-CM | POA: Diagnosis not present

## 2016-05-04 DIAGNOSIS — Z6826 Body mass index (BMI) 26.0-26.9, adult: Secondary | ICD-10-CM | POA: Diagnosis not present

## 2016-05-04 DIAGNOSIS — R05 Cough: Secondary | ICD-10-CM | POA: Diagnosis not present

## 2016-05-04 DIAGNOSIS — R110 Nausea: Secondary | ICD-10-CM | POA: Diagnosis not present

## 2016-05-04 DIAGNOSIS — J329 Chronic sinusitis, unspecified: Secondary | ICD-10-CM | POA: Diagnosis not present

## 2016-05-04 DIAGNOSIS — E663 Overweight: Secondary | ICD-10-CM | POA: Diagnosis not present

## 2016-05-04 DIAGNOSIS — Z1389 Encounter for screening for other disorder: Secondary | ICD-10-CM | POA: Diagnosis not present

## 2016-06-07 DIAGNOSIS — M9901 Segmental and somatic dysfunction of cervical region: Secondary | ICD-10-CM | POA: Diagnosis not present

## 2016-06-07 DIAGNOSIS — S233XXA Sprain of ligaments of thoracic spine, initial encounter: Secondary | ICD-10-CM | POA: Diagnosis not present

## 2016-06-07 DIAGNOSIS — M47812 Spondylosis without myelopathy or radiculopathy, cervical region: Secondary | ICD-10-CM | POA: Diagnosis not present

## 2016-06-17 DIAGNOSIS — E039 Hypothyroidism, unspecified: Secondary | ICD-10-CM | POA: Diagnosis not present

## 2016-06-21 ENCOUNTER — Ambulatory Visit (INDEPENDENT_AMBULATORY_CARE_PROVIDER_SITE_OTHER): Payer: PPO | Admitting: Otolaryngology

## 2016-06-21 DIAGNOSIS — J342 Deviated nasal septum: Secondary | ICD-10-CM

## 2016-06-21 DIAGNOSIS — J343 Hypertrophy of nasal turbinates: Secondary | ICD-10-CM

## 2016-06-30 DIAGNOSIS — M3500 Sicca syndrome, unspecified: Secondary | ICD-10-CM | POA: Diagnosis not present

## 2016-06-30 DIAGNOSIS — M2550 Pain in unspecified joint: Secondary | ICD-10-CM | POA: Diagnosis not present

## 2016-06-30 DIAGNOSIS — M150 Primary generalized (osteo)arthritis: Secondary | ICD-10-CM | POA: Diagnosis not present

## 2016-06-30 DIAGNOSIS — M1990 Unspecified osteoarthritis, unspecified site: Secondary | ICD-10-CM | POA: Diagnosis not present

## 2016-06-30 DIAGNOSIS — E039 Hypothyroidism, unspecified: Secondary | ICD-10-CM | POA: Diagnosis not present

## 2016-07-01 DIAGNOSIS — E039 Hypothyroidism, unspecified: Secondary | ICD-10-CM | POA: Diagnosis not present

## 2016-07-19 DIAGNOSIS — S233XXA Sprain of ligaments of thoracic spine, initial encounter: Secondary | ICD-10-CM | POA: Diagnosis not present

## 2016-07-19 DIAGNOSIS — M9901 Segmental and somatic dysfunction of cervical region: Secondary | ICD-10-CM | POA: Diagnosis not present

## 2016-07-19 DIAGNOSIS — M47812 Spondylosis without myelopathy or radiculopathy, cervical region: Secondary | ICD-10-CM | POA: Diagnosis not present

## 2016-07-22 DIAGNOSIS — M47812 Spondylosis without myelopathy or radiculopathy, cervical region: Secondary | ICD-10-CM | POA: Diagnosis not present

## 2016-07-22 DIAGNOSIS — S233XXA Sprain of ligaments of thoracic spine, initial encounter: Secondary | ICD-10-CM | POA: Diagnosis not present

## 2016-07-22 DIAGNOSIS — M9901 Segmental and somatic dysfunction of cervical region: Secondary | ICD-10-CM | POA: Diagnosis not present

## 2016-07-26 DIAGNOSIS — M47812 Spondylosis without myelopathy or radiculopathy, cervical region: Secondary | ICD-10-CM | POA: Diagnosis not present

## 2016-07-26 DIAGNOSIS — S233XXA Sprain of ligaments of thoracic spine, initial encounter: Secondary | ICD-10-CM | POA: Diagnosis not present

## 2016-07-26 DIAGNOSIS — M9901 Segmental and somatic dysfunction of cervical region: Secondary | ICD-10-CM | POA: Diagnosis not present

## 2016-07-28 DIAGNOSIS — M9901 Segmental and somatic dysfunction of cervical region: Secondary | ICD-10-CM | POA: Diagnosis not present

## 2016-07-28 DIAGNOSIS — M47812 Spondylosis without myelopathy or radiculopathy, cervical region: Secondary | ICD-10-CM | POA: Diagnosis not present

## 2016-07-28 DIAGNOSIS — S233XXA Sprain of ligaments of thoracic spine, initial encounter: Secondary | ICD-10-CM | POA: Diagnosis not present

## 2016-08-11 DIAGNOSIS — S233XXA Sprain of ligaments of thoracic spine, initial encounter: Secondary | ICD-10-CM | POA: Diagnosis not present

## 2016-08-11 DIAGNOSIS — M47812 Spondylosis without myelopathy or radiculopathy, cervical region: Secondary | ICD-10-CM | POA: Diagnosis not present

## 2016-08-11 DIAGNOSIS — M9901 Segmental and somatic dysfunction of cervical region: Secondary | ICD-10-CM | POA: Diagnosis not present

## 2016-08-12 DIAGNOSIS — Z23 Encounter for immunization: Secondary | ICD-10-CM | POA: Diagnosis not present

## 2016-09-08 DIAGNOSIS — S233XXA Sprain of ligaments of thoracic spine, initial encounter: Secondary | ICD-10-CM | POA: Diagnosis not present

## 2016-09-08 DIAGNOSIS — M47812 Spondylosis without myelopathy or radiculopathy, cervical region: Secondary | ICD-10-CM | POA: Diagnosis not present

## 2016-09-08 DIAGNOSIS — M9901 Segmental and somatic dysfunction of cervical region: Secondary | ICD-10-CM | POA: Diagnosis not present

## 2016-09-20 ENCOUNTER — Ambulatory Visit (INDEPENDENT_AMBULATORY_CARE_PROVIDER_SITE_OTHER): Payer: PPO | Admitting: Otolaryngology

## 2016-09-20 DIAGNOSIS — J343 Hypertrophy of nasal turbinates: Secondary | ICD-10-CM

## 2016-09-20 DIAGNOSIS — J342 Deviated nasal septum: Secondary | ICD-10-CM | POA: Diagnosis not present

## 2016-09-20 DIAGNOSIS — R490 Dysphonia: Secondary | ICD-10-CM

## 2016-10-07 DIAGNOSIS — Z6826 Body mass index (BMI) 26.0-26.9, adult: Secondary | ICD-10-CM | POA: Diagnosis not present

## 2016-10-07 DIAGNOSIS — R002 Palpitations: Secondary | ICD-10-CM | POA: Diagnosis not present

## 2016-10-07 DIAGNOSIS — E663 Overweight: Secondary | ICD-10-CM | POA: Diagnosis not present

## 2016-10-07 DIAGNOSIS — J449 Chronic obstructive pulmonary disease, unspecified: Secondary | ICD-10-CM | POA: Diagnosis not present

## 2016-10-07 DIAGNOSIS — Z1389 Encounter for screening for other disorder: Secondary | ICD-10-CM | POA: Diagnosis not present

## 2016-10-07 DIAGNOSIS — F419 Anxiety disorder, unspecified: Secondary | ICD-10-CM | POA: Diagnosis not present

## 2016-10-07 DIAGNOSIS — K219 Gastro-esophageal reflux disease without esophagitis: Secondary | ICD-10-CM | POA: Diagnosis not present

## 2016-10-07 DIAGNOSIS — E063 Autoimmune thyroiditis: Secondary | ICD-10-CM | POA: Diagnosis not present

## 2016-10-07 DIAGNOSIS — Z719 Counseling, unspecified: Secondary | ICD-10-CM | POA: Diagnosis not present

## 2016-10-11 DIAGNOSIS — R490 Dysphonia: Secondary | ICD-10-CM | POA: Diagnosis not present

## 2016-10-11 DIAGNOSIS — Z79899 Other long term (current) drug therapy: Secondary | ICD-10-CM | POA: Diagnosis not present

## 2016-10-11 DIAGNOSIS — J385 Laryngeal spasm: Secondary | ICD-10-CM | POA: Diagnosis not present

## 2016-10-11 DIAGNOSIS — J383 Other diseases of vocal cords: Secondary | ICD-10-CM | POA: Diagnosis not present

## 2016-10-11 DIAGNOSIS — Z882 Allergy status to sulfonamides status: Secondary | ICD-10-CM | POA: Diagnosis not present

## 2016-10-11 DIAGNOSIS — E039 Hypothyroidism, unspecified: Secondary | ICD-10-CM | POA: Diagnosis not present

## 2016-10-11 DIAGNOSIS — J387 Other diseases of larynx: Secondary | ICD-10-CM | POA: Diagnosis not present

## 2016-10-11 DIAGNOSIS — Z885 Allergy status to narcotic agent status: Secondary | ICD-10-CM | POA: Diagnosis not present

## 2016-10-11 DIAGNOSIS — F17210 Nicotine dependence, cigarettes, uncomplicated: Secondary | ICD-10-CM | POA: Diagnosis not present

## 2016-10-11 DIAGNOSIS — R1310 Dysphagia, unspecified: Secondary | ICD-10-CM | POA: Diagnosis not present

## 2016-10-11 DIAGNOSIS — R251 Tremor, unspecified: Secondary | ICD-10-CM | POA: Diagnosis not present

## 2016-10-15 DIAGNOSIS — R490 Dysphonia: Secondary | ICD-10-CM | POA: Insufficient documentation

## 2016-10-15 DIAGNOSIS — J385 Laryngeal spasm: Secondary | ICD-10-CM | POA: Insufficient documentation

## 2016-10-15 DIAGNOSIS — J383 Other diseases of vocal cords: Secondary | ICD-10-CM | POA: Insufficient documentation

## 2016-10-15 DIAGNOSIS — M1711 Unilateral primary osteoarthritis, right knee: Secondary | ICD-10-CM | POA: Diagnosis not present

## 2016-11-02 DIAGNOSIS — Z6827 Body mass index (BMI) 27.0-27.9, adult: Secondary | ICD-10-CM | POA: Diagnosis not present

## 2016-11-02 DIAGNOSIS — M8588 Other specified disorders of bone density and structure, other site: Secondary | ICD-10-CM | POA: Diagnosis not present

## 2016-11-02 DIAGNOSIS — Z01419 Encounter for gynecological examination (general) (routine) without abnormal findings: Secondary | ICD-10-CM | POA: Diagnosis not present

## 2016-11-02 DIAGNOSIS — N958 Other specified menopausal and perimenopausal disorders: Secondary | ICD-10-CM | POA: Diagnosis not present

## 2016-11-02 DIAGNOSIS — Z1231 Encounter for screening mammogram for malignant neoplasm of breast: Secondary | ICD-10-CM | POA: Diagnosis not present

## 2016-11-02 LAB — HM DEXA SCAN

## 2016-11-03 ENCOUNTER — Other Ambulatory Visit: Payer: Self-pay | Admitting: Obstetrics and Gynecology

## 2016-11-03 DIAGNOSIS — R928 Other abnormal and inconclusive findings on diagnostic imaging of breast: Secondary | ICD-10-CM

## 2016-11-05 DIAGNOSIS — J387 Other diseases of larynx: Secondary | ICD-10-CM | POA: Diagnosis not present

## 2016-11-05 DIAGNOSIS — R490 Dysphonia: Secondary | ICD-10-CM | POA: Diagnosis not present

## 2016-11-05 DIAGNOSIS — Z79899 Other long term (current) drug therapy: Secondary | ICD-10-CM | POA: Diagnosis not present

## 2016-11-05 DIAGNOSIS — J385 Laryngeal spasm: Secondary | ICD-10-CM | POA: Diagnosis not present

## 2016-11-09 ENCOUNTER — Ambulatory Visit
Admission: RE | Admit: 2016-11-09 | Discharge: 2016-11-09 | Disposition: A | Discharge status: Home / Self Care | Payer: PPO | Source: Ambulatory Visit | Attending: Obstetrics and Gynecology | Admitting: Obstetrics and Gynecology

## 2016-11-09 DIAGNOSIS — R928 Other abnormal and inconclusive findings on diagnostic imaging of breast: Secondary | ICD-10-CM

## 2016-11-23 ENCOUNTER — Encounter: Payer: Self-pay | Admitting: Cardiology

## 2016-11-23 NOTE — Progress Notes (Signed)
Cardiology Office Note  Date: 11/24/2016   ID: Mary Hart, DOB 1941-03-23, MRN [id number]  PCP: Mary Hart  Consulting Cardiologist: Mary Lesches, MD   Chief Complaint  Patient presents with  . Palpitations    History of Present Illness: Mary Hart is a 76 y.o. female referred for cardiology consultation by Dr. Gerarda Hart for evaluation of palpitations. She states that over the last 6 months she has noticed a feeling of rapid heart rate intermittently. There does not appear to be any specific precipitant as best she can tell. She has no chest pain or breathlessness with this. Feels anxiety and sometimes it is hard to tell which comes first, the palpitations or the feeling of anxiety. She has had no syncope.  She anticipates right knee surgery with Mary Hart in March. Also undergoing an evaluation for tremors and voice change with neurology appointment pending at [facility].  She has no history of cardiac arrhythmia, CAD or cardiomyopathy. I reviewed her ECG today which shows sinus rhythm with decreased R wave progression and nonspecific ST changes.  Past Medical History:  Diagnosis Date  . Anxiety   . Arthritis   . Clostridium difficile infection    Treated with vancomycin  . COPD (chronic obstructive pulmonary disease) ([place])   . Depression   . Diverticula of colon   . GERD (gastroesophageal reflux disease)   . Hypothyroid   . IBS (irritable bowel syndrome)   . Internal hemorrhoids   . Neuropathy [facility])     Past Surgical History:  Procedure Laterality Date  . ABDOMINAL HYSTERECTOMY    . Anterior Cervical Fusion X 2    . CHOLECYSTECTOMY    . COLONOSCOPY  06/20/2008   [place] Dr. Wynelle Hart external hemorrhoids  . COLONOSCOPY  10/12/2012   Dr. Gala Hart- internal hemorrhoids, very early, few, sigmoid diverticula o/w normal appearing colon and terminal ileum. + cdiff on stool samples taken. Segmental bx negative.  . ESOPHAGOGASTRODUODENOSCOPY  11/25/10   Mary Hart- tubular esophagus s/p of 56 dilator from a small HH/otherwise normal  . JOINT REPLACEMENT  09/2010   Thumb  . VESICOVAGINAL FISTULA CLOSURE W/ TAH      Current Outpatient Prescriptions  Medication Sig Dispense Refill  . ALPRAZolam (XANAX) 0.25 MG tablet Take 1 tablet by mouth 3 (three) times daily as needed for anxiety.     . Aspirin-Salicylamide-Caffeine (BC HEADACHE POWDER PO) Take 1 Package by mouth 3 (three) times daily as needed (pain/headaches).    . cetirizine (ZYRTEC) 10 MG tablet Take 10 mg by mouth at bedtime.    . cholecalciferol (VITAMIN D) 1000 units tablet Take 1,000 Units by mouth daily.    Marland Kitchen ipratropium (ATROVENT) 0.06 % nasal spray Place into the nose.    . levothyroxine (SYNTHROID, LEVOTHROID) 50 MCG tablet Take by mouth.    . MELOXICAM PO Take by mouth.    . ranitidine (ZANTAC) 150 MG capsule Take 150 mg by mouth 2 (two) times daily.    . Simethicone (GAS-X PO) Take 1 capsule by mouth daily as needed (gas relief).      No current facility-administered medications for this visit.    Allergies:  Codeine; Hydromorphone hcl; Morphine; Moxifloxacin; and Sulfonamide derivatives   Social History: The patient  reports that she has been smoking Cigarettes.  She has a 37.50 pack-year smoking history. She has never used smokeless tobacco. She reports that she drinks alcohol. She reports that she does not use drugs.   Family History: The patient's family history  includes Breast cancer in her mother; Colon cancer in her maternal uncle; Dementia in her father and mother.   ROS:  Please see the history of present illness. Otherwise, complete review of systems is positive for tremors, weak voice and hoarseness.  All other systems are reviewed and negative.   Physical Exam: VS:  BP 120/64 (BP Location: Left Arm)   Pulse 92   Ht 5\' 3"  (1.6 m)   Wt 142 lb (64.4 kg)   SpO2 98%   BMI 25.15 kg/m , BMI Body mass index is 25.15 kg/m.  Wt Readings from Last 3 Encounters:    11/24/16 142 lb (64.4 kg)  11/04/15 144 lb 6.4 oz (65.5 kg)  02/17/15 140 lb (63.5 kg)    General: Patient appears comfortable at rest. HEENT: Conjunctiva and lids normal, oropharynx clear. Neck: Supple, no elevated JVP or carotid bruits, no thyromegaly. Lungs: Clear to auscultation, nonlabored breathing at rest. Cardiac: Regular rate and rhythm, no S3 or significant systolic murmur, no pericardial rub. Abdomen: Soft, nontender, bowel sounds present, no guarding or rebound. Extremities: No pitting edema, distal pulses 2+. Skin: Warm and dry. Musculoskeletal: No kyphosis. Neuropsychiatric: Alert and oriented x3, affect grossly appropriate.  ECG: I personally reviewed the tracing from 04/30/2012 which showed normal sinus rhythm.  Other Studies Reviewed Today:  Chest x-ray 08/28/2013: FINDINGS: Normal heart size and pulmonary vascularity.  Minimally tortuous thoracic aorta.  Lungs slightly hyperinflated but clear.  No pleural effusion or pneumothorax.  Prior cervical spine fusion.  Right apical pleural calcification stable.  Surgical clips right upper quadrant question cholecystectomy.  IMPRESSION: No acute abnormalities.  Assessment and Plan:  1. Palpitations, no associated dizziness or syncope. Baseline ECG is nonspecific. Reports no major medication changes although Synthroid dose was increased to 50 mg about 3 months ago. Plan at this time is to obtain a 48-hour Holter monitor, she reports symptoms on nearly a daily basis. We will also obtain an echocardiogram to assess cardiac structure and function.  2. Hypothyroidism, on Synthroid. This is followed by Dr. Gasper Hart.  3. Tobacco abuse. COPD by history.  4. GERD, on Zantac.  Current medicines were reviewed with the patient today.   Orders Placed This Encounter  Procedures  . Holter monitor - 48 hour  . EKG 12-Lead  . ECHOCARDIOGRAM COMPLETE    Disposition: Call with test results.  Signed, Mary Sark, MD, [facility] 11/24/2016 10:47 AM    [place] [facility] [place]. [address], [place], [place] [address] Phone: [phone]; Fax: [phone]

## 2016-11-24 ENCOUNTER — Ambulatory Visit (INDEPENDENT_AMBULATORY_CARE_PROVIDER_SITE_OTHER): Payer: PPO | Admitting: Cardiology

## 2016-11-24 ENCOUNTER — Encounter: Payer: Self-pay | Admitting: Cardiology

## 2016-11-24 VITALS — BP 120/64 | HR 92 | Ht 63.0 in | Wt 142.0 lb

## 2016-11-24 DIAGNOSIS — M8580 Other specified disorders of bone density and structure, unspecified site: Secondary | ICD-10-CM | POA: Diagnosis not present

## 2016-11-24 DIAGNOSIS — Z720 Tobacco use: Secondary | ICD-10-CM

## 2016-11-24 DIAGNOSIS — R002 Palpitations: Secondary | ICD-10-CM

## 2016-11-24 DIAGNOSIS — E039 Hypothyroidism, unspecified: Secondary | ICD-10-CM | POA: Diagnosis not present

## 2016-11-24 DIAGNOSIS — J449 Chronic obstructive pulmonary disease, unspecified: Secondary | ICD-10-CM | POA: Diagnosis not present

## 2016-11-24 NOTE — Patient Instructions (Signed)
Medication Instructions:  Your physician recommends that you continue on your current medications as directed. Please refer to the Current Medication list given to you today.   Labwork: none  Testing/Procedures: Your physician has recommended that you wear a holter monitor. Holter monitors are medical devices that record the heart's electrical activity. Doctors most often use these monitors to diagnose arrhythmias. Arrhythmias are problems with the speed or rhythm of the heartbeat. The monitor is a small, portable device. You can wear one while you do your normal daily activities. This is usually used to diagnose what is causing palpitations/syncope (passing out).  Your physician has requested that you have an echocardiogram. Echocardiography is a painless test that uses sound waves to create images of your heart. It provides your doctor with information about the size and shape of your heart and how well your heart's chambers and valves are working. This procedure takes approximately one hour. There are no restrictions for this procedure.    Follow-Up: Your physician recommends that you schedule a follow-up appointment in: to be determined - We will call with test results    Any Other Special Instructions Will Be Listed Below (If Applicable).     If you need a refill on your cardiac medications before your next appointment, please call your pharmacy.

## 2016-11-30 ENCOUNTER — Telehealth: Payer: Self-pay | Admitting: Internal Medicine

## 2016-11-30 NOTE — Telephone Encounter (Signed)
Left message for patient to call back  

## 2016-11-30 NOTE — Telephone Encounter (Signed)
Please see question from the patient about what antibiotics she should avoid

## 2016-11-30 NOTE — Telephone Encounter (Signed)
There is no evidence to tell us that she should avoid certain ones though if possible I suggest trying to avoid cipro and family of drugs.  Taking florastor 250 mg bid is reasonable to do before and after the knee surgery perhaps starting a week before and a month after if ok with Dr. Elmyra Ricks

## 2016-11-30 NOTE — Telephone Encounter (Signed)
Patient notified of the recommendations and I will mail her a copy.

## 2016-12-01 ENCOUNTER — Ambulatory Visit (HOSPITAL_COMMUNITY)
Admission: RE | Admit: 2016-12-01 | Discharge: 2016-12-01 | Disposition: A | Discharge status: Home / Self Care | Payer: PPO | Source: Ambulatory Visit | Attending: Cardiology | Admitting: Cardiology

## 2016-12-01 DIAGNOSIS — R002 Palpitations: Secondary | ICD-10-CM | POA: Diagnosis not present

## 2016-12-01 DIAGNOSIS — I340 Nonrheumatic mitral (valve) insufficiency: Secondary | ICD-10-CM | POA: Insufficient documentation

## 2016-12-01 LAB — ECHOCARDIOGRAM COMPLETE
CHL CUP DOP CALC LVOT VTI: 30.7 cm
CHL CUP MV DEC (S): 264
CHL CUP STROKE VOLUME: 35 mL
E decel time: 264 msec
E/e' ratio: 11.41
FS: 47 % — AB (ref 28–44)
IV/PV OW: 1.27
LA diam end sys: 29 mm
LADIAMINDEX: 1.7 cm/m2
LASIZE: 29 mm
LAVOL: 36.8 mL
LAVOLA4C: 36.3 mL
LAVOLIN: 21.6 mL/m2
LV E/e' medial: 11.41
LV E/e'average: 11.41
LV PW d: 7.98 mm — AB (ref 0.6–1.1)
LV dias vol index: 30 mL/m2
LV e' LATERAL: 9.03 cm/s
LV sys vol index: 10 mL/m2
LV sys vol: 17 mL (ref 14–42)
LVDIAVOL: 51 mL (ref 46–106)
LVOT area: 2.54 cm2
LVOT diameter: 18 mm
LVOT peak grad rest: 8 mmHg
LVOT peak vel: 144 cm/s
LVOTSV: 78 mL
Lateral S' vel: 14.3 cm/s
MV Peak grad: 4 mmHg
MV pk A vel: 94.3 m/s
MV pk E vel: 103 m/s
RV sys press: 24 mmHg
Reg peak vel: 230 cm/s
Simpson's disk: 68
TAPSE: 23 mm
TDI e' lateral: 9.03
TDI e' medial: 8.7
TRMAXVEL: 230 cm/s

## 2016-12-01 NOTE — Progress Notes (Signed)
*  PRELIMINARY RESULTS* Echocardiogram 2D Echocardiogram has been performed.  Samuel Germany 12/01/2016, 2:00 PM

## 2016-12-02 ENCOUNTER — Ambulatory Visit: Payer: Self-pay | Admitting: Orthopedic Surgery

## 2016-12-02 DIAGNOSIS — R799 Abnormal finding of blood chemistry, unspecified: Secondary | ICD-10-CM | POA: Diagnosis not present

## 2016-12-02 NOTE — H&P (Signed)
Mary Hart DOB: 10-03-1941 Married / Language: English / Race: White Female Date of Admission: 12/27/2016 CC:  Right Knee Pain History of Present Illness  The patient is a 76 year old female who comes in  for a preoperative History and Physical. The patient is scheduled for a right total knee arthroplasty to be performed by Dr. Dione Plover. Aluisio, MD at [facility] on 12-27-2016. The patient is a 76 year old female who presented for their right knee pain and osteoarthritis. They are now 2 year(s) out from when symptoms began (pain ever since sx). Symptoms reported include: pain, pain after sitting, pain at night, swelling, aching, stiffness, instability, difficulty ambulating and difficulty arising from chair. The patient feels that they are doing poorly and report their pain level to be moderate. Current treatment includes: relative rest and icing. The following medication has been used for pain control: none. The patient has not gotten any relief of their symptoms with Cortisone injections or viscosupplementation. The patient has had a right knee scope in 2015 by Dr. Veverly Fells. She has significant pain and dysfunction in her right knee. She had an arthroscopy done by Dr. Veverly Fells a few years ago, did not get complete relief. She was told she may need another arthroscopy, but felt as though since the first one did not help that another would not be of benefit. The knee is hurting at all times. It is limiting what she can and cannot do. It does occasionally want to give out on her. It does hurt at night. She has had cortisone and viscosupplement injections without any long lasting benefit.  Radiographs taken in the office of both knees and lateral of the right shows significant medial and patellofemoral narrowing in her right knee with osteophyte formation. She does not have bone on bone change, but it is very close to being bone on bone. She has got advanced arthritic change with progressive pain and  dysfunction. She has had an arthroscopy in the past. She did have cortisone and viscosupplement injections in the past without benefit. At this point, the most predictable means of getting her better would be total knee arthroplasty. The procedure, risks, potential complications and rehab course are discussed in detail and the patient elects to proceed. They have been treated conservatively in the past for the above stated problem and despite conservative measures, they continue to have progressive pain and severe functional limitations and dysfunction. They have failed non-operative management including home exercise, medications, and injections. It is felt that they would benefit from undergoing total joint replacement. Risks and benefits of the procedure have been discussed with the patient and they elect to proceed with surgery. There are no active contraindications to surgery such as ongoing infection or rapidly progressive neurological disease.  Problem List/Past Medical Closed nondisplaced fracture of lateral malleolus of left fibula with routine healing, subsequent encounter (S82.65XD)  Fracture of lateral malleolus [id number])  Wrist fracture (814.00)  Primary osteoarthritis of right knee (M17.11)  Fracture of tibial plateau [id number])  Acute medial meniscal tear [id number])  Osteoarthritis  Gastroesophageal Reflux Disease  Hypothyroidism  Degeneration, lumbar/lumbosacral disc (722.52) [11/29/2001]: Anxiety Disorder  Depression  Glaucoma  Bronchitis  Childhood Jaundice  Urinary Tract Infection  Osteopenia  Measles  Menopause   Allergies Avelox *FLUOROQUINOLONES*  Sulfanilamide *CHEMICALS*  Sulfa drugs Morphine Sulfate *ANALGESICS - OPIOID*  Very Sick HYDROcodone Bitartrate *CHEMICALS*  Itching. 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 Marital status  Widowed. Copy of Drug/Alcohol Rehab (Previously)  no Children  4 Alcohol use  current drinker; drinks wine Number of flights of stairs before winded  2-3 Tobacco use  current every day smoker; smoke(d) less than 1/2 pack(s) per day Pain Contract  no Tobacco / smoke exposure  yes outdoors only Drug/Alcohol Rehab (Currently)  no Exercise  Exercises rarely; does running / walking Illicit drug use  no Advance Directives  Living Will, Healthcare POA  Medication History  Cetirizine HCl (10MG  Tablet, Oral) Active. Meloxicam (15MG  Tablet, Oral) Active. RaNITidine HCl (150MG  Tablet, Oral) Active. Ipratropium Bromide (0.06% Solution, Nasal) Active. Xanax (0.5MG  Tablet, Oral) Active. Vitamin D3 (1000UNIT Tablet Chewable, Oral) Active. BC Headache (325-16-95MG  Tablet, Oral) Active. Levothyroxine Sodium (25MCG Tablet, Oral) Active.  Past Surgical History  Neck Disc Surgery  Spinal Fusion  neck Cataract Surgery  bilateral Tonsillectomy  Hysterectomy  partial (non-cancerous) Gallbladder Surgery  laporoscopic Breast Biopsy  bilateral, multiple times  Review of Systems General Not Present- Chills, Fatigue, Fever, Memory Loss, Night Sweats, Weight Gain and Weight Loss. Skin Not Present- Eczema, Hives, Itching, Lesions and Rash. HEENT Present- Headache. Not Present- Dentures, Double Vision, Hearing Loss, Tinnitus and Visual Loss. Respiratory Not Present- Allergies, Chronic Cough, Coughing up blood, Shortness of breath at rest and Shortness of breath with exertion. Cardiovascular Not Present- Chest Pain, Difficulty Breathing Lying Down, Murmur, Palpitations, Racing/skipping heartbeats and Swelling. Gastrointestinal Present- Heartburn. Not Present- Abdominal Pain, Bloody Stool, Constipation, Diarrhea, Difficulty Swallowing, Jaundice, Loss of appetitie, Nausea and Vomiting. Female  Genitourinary Present- Urinary frequency. Not Present- Blood in Urine, Discharge, Flank Pain, Incontinence, Painful Urination, Urgency, Urinary Retention, Urinating at Night and Weak urinary stream. Musculoskeletal Present- Joint Pain and Morning Stiffness. Not Present- Back Pain, Joint Swelling, Muscle Pain, Muscle Weakness and Spasms. Neurological Not Present- Blackout spells, Difficulty with balance, Dizziness, Paralysis, Tremor and Weakness. Psychiatric Not Present- Insomnia.  Vitals Weight: 142 lb Height: 63in Body Surface Area: 1.67 m Body Mass Index: 25.15 kg/m  Pulse: 68 (Regular)  BP: 118/60 (Sitting, Right Arm, Standard)   Physical Exam  General Mental Status -Alert, cooperative and good historian. General Appearance-pleasant, Not in acute distress. Orientation-Oriented X3. Build & Nutrition-Well nourished and Well developed.  Head and Neck Head-normocephalic, atraumatic . Neck Global Assessment - supple, no bruit auscultated on the right, no bruit auscultated on the left.  Eye Pupil - Bilateral-Regular and Round. Motion - Bilateral-EOMI.  Chest and Lung Exam Auscultation Breath sounds - clear at anterior chest wall and clear at posterior chest wall. Adventitious sounds - No Adventitious sounds.  Cardiovascular Auscultation Rhythm - Regular rate and rhythm. Heart Sounds - S1 WNL and S2 WNL. Murmurs & Other Heart Sounds - Auscultation of the heart reveals - No Murmurs.  Abdomen Palpation/Percussion Tenderness - Abdomen is non-tender to palpation. Rigidity (guarding) - Abdomen is soft. Auscultation Auscultation of the abdomen reveals - Bowel sounds normal.  Female Genitourinary Note: Not done, not pertinent to present illness   Musculoskeletal Note: Well-developed female, alert and oriented, no apparent distress. Evaluation of her hip shows normal range of motion, no discomfort. Left knee, no swelling, range about 0 to 115-120 with no  tenderness or instability. Right knee, no effusion, slight varus, range 5 to 125, moderate crepitus on range of motion. Tender medial greater than lateral, no instability noted. Gait pattern is significantly antalgic on the right.  RADIOGRAPHS  AP both knees and lateral of the right shows significant medial and patellofemoral narrowing in her right knee with osteophyte formation. She does not have bone on bone change, but it is very close to being bone on bone.  Assessment & Plan Primary osteoarthritis of right knee (M17.11)  Note:Surgical Plans: Right Total Knee Replacement  Disposition: Home with hlep from [facility]  PCP: Dr. Gerarda Fraction  IV TXA  Anesthesia Issues: VERY SICK with anesthesia  Patient was instructed on what medications to stop prior to surgery.  Signed electronically by Ok Edwards, III PA-C

## 2016-12-08 DIAGNOSIS — D225 Melanocytic nevi of trunk: Secondary | ICD-10-CM | POA: Diagnosis not present

## 2016-12-08 DIAGNOSIS — L218 Other seborrheic dermatitis: Secondary | ICD-10-CM | POA: Diagnosis not present

## 2016-12-08 DIAGNOSIS — Z1283 Encounter for screening for malignant neoplasm of skin: Secondary | ICD-10-CM | POA: Diagnosis not present

## 2016-12-10 ENCOUNTER — Ambulatory Visit: Payer: Self-pay | Admitting: Orthopedic Surgery

## 2016-12-10 DIAGNOSIS — E063 Autoimmune thyroiditis: Secondary | ICD-10-CM | POA: Diagnosis not present

## 2016-12-10 DIAGNOSIS — M1991 Primary osteoarthritis, unspecified site: Secondary | ICD-10-CM | POA: Diagnosis not present

## 2016-12-10 DIAGNOSIS — J449 Chronic obstructive pulmonary disease, unspecified: Secondary | ICD-10-CM | POA: Diagnosis not present

## 2016-12-10 DIAGNOSIS — Z1389 Encounter for screening for other disorder: Secondary | ICD-10-CM | POA: Diagnosis not present

## 2016-12-10 DIAGNOSIS — E663 Overweight: Secondary | ICD-10-CM | POA: Diagnosis not present

## 2016-12-10 DIAGNOSIS — Z6825 Body mass index (BMI) 25.0-25.9, adult: Secondary | ICD-10-CM | POA: Diagnosis not present

## 2016-12-10 DIAGNOSIS — J329 Chronic sinusitis, unspecified: Secondary | ICD-10-CM | POA: Diagnosis not present

## 2016-12-10 NOTE — Progress Notes (Signed)
Please place orders in EPIC as patient is being scheduled for a Pre-op appointment! Thank you! 

## 2016-12-15 DIAGNOSIS — Z6824 Body mass index (BMI) 24.0-24.9, adult: Secondary | ICD-10-CM | POA: Diagnosis not present

## 2016-12-15 DIAGNOSIS — M1990 Unspecified osteoarthritis, unspecified site: Secondary | ICD-10-CM | POA: Diagnosis not present

## 2016-12-15 DIAGNOSIS — M150 Primary generalized (osteo)arthritis: Secondary | ICD-10-CM | POA: Diagnosis not present

## 2016-12-15 DIAGNOSIS — Z79899 Other long term (current) drug therapy: Secondary | ICD-10-CM | POA: Diagnosis not present

## 2016-12-15 DIAGNOSIS — M2550 Pain in unspecified joint: Secondary | ICD-10-CM | POA: Diagnosis not present

## 2016-12-20 ENCOUNTER — Other Ambulatory Visit (HOSPITAL_COMMUNITY): Payer: Self-pay | Admitting: *Deleted

## 2016-12-20 DIAGNOSIS — J387 Other diseases of larynx: Secondary | ICD-10-CM | POA: Diagnosis not present

## 2016-12-20 DIAGNOSIS — R490 Dysphonia: Secondary | ICD-10-CM | POA: Diagnosis not present

## 2016-12-20 NOTE — Progress Notes (Addendum)
MEDICAL CLEARNCE NOTE DR Gerarda Fraction 10-28-16 ON CHART EKG AND LOV DR MCDOWELL 11-24-16 EPIC  ECHO 12-01-16 EPIC

## 2016-12-20 NOTE — Patient Instructions (Addendum)
DA VERNEY  12/20/2016   Your procedure is scheduled on: 12-27-16  Report to [facility]  Entrance take [facility]  elevators to 3rd floor to  [place] at 600 AM.  Call this number if you have problems the morning of surgery [phone]   Remember: ONLY 1 PERSON MAY GO WITH YOU TO SHORT STAY TO GET  READY MORNING OF [place].  Do not eat food or drink liquids :After Midnight.     Take these medicines the morning of surgery with A SIP OF WATER: ALPRAZOLAM (XANAX )IF NEEDED, LEVOTHYROXINE (SYNTHROID), ATROVENT NASAL SPRAY IF NEEDED, ACYVLOVIR                               You may not have any metal on your body including hair pins and              piercings  Do not wear jewelry, make-up, lotions, powders or perfumes, deodorant             Do not wear nail polish.  Do not shave  48 hours prior to surgery.              Men may shave face and neck.   Do not bring valuables to the hospital. [place].  Contacts, dentures or bridgework may not be worn into surgery.  Leave suitcase in the car. After surgery it may be brought to your room.                 Please read over the following fact sheets you were given: _____________________________________________________________________             [facility] - Preparing for Surgery Before surgery, you can play an important role.  Because skin is not sterile, your skin needs to be as free of germs as possible.  You can reduce the number of germs on your skin by washing with CHG (chlorahexidine gluconate) soap before surgery.  CHG is an antiseptic cleaner which kills germs and bonds with the skin to continue killing germs even after washing. Please DO NOT use if you have an allergy to CHG or antibacterial soaps.  If your skin becomes reddened/irritated stop using the CHG and inform your nurse when you arrive at Short Stay. Do not shave (including legs and  underarms) for at least 48 hours prior to the first CHG shower.  You may shave your face/neck. Please follow these instructions carefully:  1.  Shower with CHG Soap the night before surgery and the  morning of Surgery.  2.  If you choose to wash your hair, wash your hair first as usual with your  normal  shampoo.  3.  After you shampoo, rinse your hair and body thoroughly to remove the  shampoo.                           4.  Use CHG as you would any other liquid soap.  You can apply chg directly  to the skin and wash                       Gently with a scrungie or  clean washcloth.  5.  Apply the CHG Soap to your body ONLY FROM THE NECK DOWN.   Do not use on face/ open                           Wound or open sores. Avoid contact with eyes, ears mouth and genitals (private parts).                       Wash face,  Genitals (private parts) with your normal soap.             6.  Wash thoroughly, paying special attention to the area where your surgery  will be performed.  7.  Thoroughly rinse your body with warm water from the neck down.  8.  DO NOT shower/wash with your normal soap after using and rinsing off  the CHG Soap.                9.  Pat yourself dry with a clean towel.            10.  Wear clean pajamas.            11.  Place clean sheets on your bed the night of your first shower and do not  sleep with pets. Day of Surgery : Do not apply any lotions/deodorants the morning of surgery.  Please wear clean clothes to the hospital/surgery center.  FAILURE TO FOLLOW THESE INSTRUCTIONS MAY RESULT IN THE CANCELLATION OF YOUR SURGERY PATIENT SIGNATURE_________________________________  NURSE SIGNATURE__________________________________  ________________________________________________________________________   Adam Phenix  An incentive spirometer is a tool that can help keep your lungs clear and active. This tool measures how well you are filling your lungs with each breath. Taking  long deep breaths may help reverse or decrease the chance of developing breathing (pulmonary) problems (especially infection) following:  A long period of time when you are unable to move or be active. BEFORE THE PROCEDURE   If the spirometer includes an indicator to show your best effort, your nurse or respiratory therapist will set it to a desired goal.  If possible, sit up straight or lean slightly forward. Try not to slouch.  Hold the incentive spirometer in an upright position. INSTRUCTIONS FOR USE  1. Sit on the edge of your bed if possible, or sit up as far as you can in bed or on a chair. 2. Hold the incentive spirometer in an upright position. 3. Breathe out normally. 4. Place the mouthpiece in your mouth and seal your lips tightly around it. 5. Breathe in slowly and as deeply as possible, raising the piston or the ball toward the top of the column. 6. Hold your breath for 3-5 seconds or for as long as possible. Allow the piston or ball to fall to the bottom of the column. 7. Remove the mouthpiece from your mouth and breathe out normally. 8. Rest for a few seconds and repeat Steps 1 through 7 at least 10 times every 1-2 hours when you are awake. Take your time and take a few normal breaths between deep breaths. 9. The spirometer may include an indicator to show your best effort. Use the indicator as a goal to work toward during each repetition. 10. After each set of 10 deep breaths, practice coughing to be sure your lungs are clear. If you have an incision (the cut made at the time of surgery), support your incision when coughing  by placing a pillow or rolled up towels firmly against it. Once you are able to get out of bed, walk around indoors and cough well. You may stop using the incentive spirometer when instructed by your caregiver.  RISKS AND COMPLICATIONS  Take your time so you do not get dizzy or light-headed.  If you are in pain, you may need to take or ask for pain  medication before doing incentive spirometry. It is harder to take a deep breath if you are having pain. AFTER USE  Rest and breathe slowly and easily.  It can be helpful to keep track of a log of your progress. Your caregiver can provide you with a simple table to help with this. If you are using the spirometer at home, follow these instructions: [place] IF:   You are having difficultly using the spirometer.  You have trouble using the spirometer as often as instructed.  Your pain medication is not giving enough relief while using the spirometer.  You develop fever of 100.5 F (38.1 C) or higher. SEEK IMMEDIATE MEDICAL CARE IF:   You cough up bloody sputum that had not been present before.  You develop fever of 102 F (38.9 C) or greater.  You develop worsening pain at or near the incision site. MAKE SURE YOU:   Understand these instructions.  Will watch your condition.  Will get help right away if you are not doing well or get worse. Document Released: 02/21/2007 Document Revised: 01/03/2012 Document Reviewed: 04/24/2007 ExitCare Patient Information 2014 ExitCare, [place].   ________________________________________________________________________  WHAT IS A BLOOD TRANSFUSION? Blood Transfusion Information  A transfusion is the replacement of blood or some of its parts. Blood is made up of multiple cells which provide different functions.  Red blood cells carry oxygen and are used for blood loss replacement.  White blood cells fight against infection.  Platelets control bleeding.  Plasma helps clot blood.  Other blood products are available for specialized needs, such as hemophilia or other clotting disorders. BEFORE THE TRANSFUSION  Who gives blood for transfusions?   Healthy volunteers who are fully evaluated to make sure their blood is safe. This is blood bank blood. Transfusion therapy is the safest it has ever been in the practice of medicine.  Before blood is taken from a donor, a complete history is taken to make sure that person has no history of diseases nor engages in risky social behavior (examples are intravenous drug use or sexual activity with multiple partners). The donor's travel history is screened to minimize risk of transmitting infections, such as malaria. The donated blood is tested for signs of infectious diseases, such as HIV and hepatitis. The blood is then tested to be sure it is compatible with you in order to minimize the chance of a transfusion reaction. If you or a relative donates blood, this is often done in anticipation of surgery and is not appropriate for emergency situations. It takes many days to process the donated blood. RISKS AND COMPLICATIONS Although transfusion therapy is very safe and saves many lives, the main dangers of transfusion include:   Getting an infectious disease.  Developing a transfusion reaction. This is an allergic reaction to something in the blood you were given. Every precaution is taken to prevent this. The decision to have a blood transfusion has been considered carefully by your caregiver before blood is given. Blood is not given unless the benefits outweigh the risks. AFTER THE TRANSFUSION  Right after receiving a  blood transfusion, you will usually feel much better and more energetic. This is especially true if your red blood cells have gotten low (anemic). The transfusion raises the level of the red blood cells which carry oxygen, and this usually causes an energy increase.  The nurse administering the transfusion will monitor you carefully for complications. HOME CARE INSTRUCTIONS  No special instructions are needed after a transfusion. You may find your energy is better. Speak with your caregiver about any limitations on activity for underlying diseases you may have. SEEK MEDICAL CARE IF:   Your condition is not improving after your transfusion.  You develop redness or  irritation at the intravenous (IV) site. SEEK IMMEDIATE MEDICAL CARE IF:  Any of the following symptoms occur over the next 12 hours:  Shaking chills.  You have a temperature by mouth above 102 F (38.9 C), not controlled by medicine.  Chest, back, or muscle pain.  People around you feel you are not acting correctly or are confused.  Shortness of breath or difficulty breathing.  Dizziness and fainting.  You get a rash or develop hives.  You have a decrease in urine output.  Your urine turns a dark color or changes to pink, red, or brown. Any of the following symptoms occur over the next 10 days:  You have a temperature by mouth above 102 F (38.9 C), not controlled by medicine.  Shortness of breath.  Weakness after normal activity.  The white part of the eye turns yellow (jaundice).  You have a decrease in the amount of urine or are urinating less often.  Your urine turns a dark color or changes to pink, red, or brown. Document Released: 10/08/2000 Document Revised: 01/03/2012 Document Reviewed: 05/27/2008 [facility] Patient Information 2014 [place], [place].  _______________________________________________________________________

## 2016-12-21 ENCOUNTER — Encounter (HOSPITAL_COMMUNITY): Payer: Self-pay

## 2016-12-21 ENCOUNTER — Encounter (HOSPITAL_COMMUNITY)
Admission: RE | Admit: 2016-12-21 | Discharge: 2016-12-21 | Disposition: A | Discharge status: Home / Self Care | Payer: PPO | Source: Ambulatory Visit | Attending: Orthopedic Surgery | Admitting: Orthopedic Surgery

## 2016-12-21 DIAGNOSIS — Z01812 Encounter for preprocedural laboratory examination: Secondary | ICD-10-CM | POA: Insufficient documentation

## 2016-12-21 DIAGNOSIS — M1711 Unilateral primary osteoarthritis, right knee: Secondary | ICD-10-CM | POA: Insufficient documentation

## 2016-12-21 HISTORY — DX: Unspecified jaundice: R17

## 2016-12-21 HISTORY — DX: Other diseases of vocal cords: J38.3

## 2016-12-21 HISTORY — DX: Nausea with vomiting, unspecified: R11.2

## 2016-12-21 HISTORY — DX: Herpesviral infection of urogenital system, unspecified: A60.00

## 2016-12-21 HISTORY — DX: Other specified postprocedural states: Z98.890

## 2016-12-21 LAB — COMPREHENSIVE METABOLIC PANEL
ALT: 17 U/L (ref 14–54)
ANION GAP: 7 (ref 5–15)
AST: 18 U/L (ref 15–41)
Albumin: 3.9 g/dL (ref 3.5–5.0)
Alkaline Phosphatase: 68 U/L (ref 38–126)
BUN: 16 mg/dL (ref 6–20)
CHLORIDE: 103 mmol/L (ref 101–111)
CO2: 26 mmol/L (ref 22–32)
Calcium: 8.7 mg/dL — ABNORMAL LOW (ref 8.9–10.3)
Creatinine, Ser: 0.72 mg/dL (ref 0.44–1.00)
GFR calc non Af Amer: >60 mL/min (ref >60)
Glucose, Bld: 70 mg/dL (ref 65–99)
POTASSIUM: 3.6 mmol/L (ref 3.5–5.1)
SODIUM: 136 mmol/L (ref 135–145)
Total Bilirubin: 0.4 mg/dL (ref 0.3–1.2)
Total Protein: 6.2 g/dL — ABNORMAL LOW (ref 6.5–8.1)

## 2016-12-21 LAB — CBC
HCT: 41 % (ref 36.0–46.0)
Hemoglobin: 13.8 g/dL (ref 12.0–15.0)
MCH: 30.5 pg (ref 26.0–34.0)
MCHC: 33.7 g/dL (ref 30.0–36.0)
MCV: 90.7 fL (ref 78.0–100.0)
PLATELETS: 260 10*3/uL (ref 150–400)
RBC: 4.52 MIL/uL (ref 3.87–5.11)
RDW: 14.6 % (ref 11.5–15.5)
WBC: 8.6 10*3/uL (ref 4.0–10.5)

## 2016-12-21 LAB — ABO/RH: ABO/RH(D): A POS

## 2016-12-21 LAB — APTT: aPTT: 25 seconds (ref 24–36)

## 2016-12-21 LAB — SURGICAL PCR SCREEN
MRSA, PCR: NEGATIVE
Staphylococcus aureus: NEGATIVE

## 2016-12-21 LAB — PROTIME-INR
INR: 0.93
PROTHROMBIN TIME: 12.5 s (ref 11.4–15.2)

## 2016-12-26 ENCOUNTER — Ambulatory Visit: Payer: Self-pay | Admitting: Orthopedic Surgery

## 2016-12-26 NOTE — H&P (Signed)
Mary Hart DOB: 1940-11-18 Married / Language: English / Race: White Female Date of Admission: 12/27/2016 CC:  Right Knee Pain History of Present Illness  The patient is a 76 year old female who comes in  for a preoperative History and Physical. The patient is scheduled for a right total knee arthroplasty to be performed by Dr. Dione Plover. Aluisio, MD at [facility] on 12-27-2016. The patient is a 76 year old female who presented for their right knee pain and osteoarthritis. They are now 2 year(s) out from when symptoms began (pain ever since sx). Symptoms reported include: pain, pain after sitting, pain at night, swelling, aching, stiffness, instability, difficulty ambulating and difficulty arising from chair. The patient feels that they are doing poorly and report their pain level to be moderate. Current treatment includes: relative rest and icing. The following medication has been used for pain control: none. The patient has not gotten any relief of their symptoms with Cortisone injections or viscosupplementation. The patient has had a right knee scope in 2015 by Dr. Veverly Fells. She has significant pain and dysfunction in her right knee. She had an arthroscopy done by Dr. Veverly Fells a few years ago, did not get complete relief. She was told she may need another arthroscopy, but felt as though since the first one did not help that another would not be of benefit. The knee is hurting at all times. It is limiting what she can and cannot do. It does occasionally want to give out on her. It does hurt at night. She has had cortisone and viscosupplement injections without any long lasting benefit.  Radiographs taken in the office of both knees and lateral of the right shows significant medial and patellofemoral narrowing in her right knee with osteophyte formation. She does not have bone on bone change, but it is very close to being bone on bone. She has got advanced arthritic change with progressive pain  and dysfunction. She has had an arthroscopy in the past. She did have cortisone and viscosupplement injections in the past without benefit. At this point, the most predictable means of getting her better would be total knee arthroplasty. The procedure, risks, potential complications and rehab course are discussed in detail and the patient elects to proceed. They have been treated conservatively in the past for the above stated problem and despite conservative measures, they continue to have progressive pain and severe functional limitations and dysfunction. They have failed non-operative management including home exercise, medications, and injections. It is felt that they would benefit from undergoing total joint replacement. Risks and benefits of the procedure have been discussed with the patient and they elect to proceed with surgery. There are no active contraindications to surgery such as ongoing infection or rapidly progressive neurological disease.  Problem List/Past Medical Closed nondisplaced fracture of lateral malleolus of left fibula with routine healing, subsequent encounter (S82.65XD)  Fracture of lateral malleolus [id number])  Wrist fracture (814.00)  Primary osteoarthritis of right knee (M17.11)  Fracture of tibial plateau [id number])  Acute medial meniscal tear [id number])  Osteoarthritis  Gastroesophageal Reflux Disease  Hypothyroidism  Degeneration, lumbar/lumbosacral disc (722.52) [11/29/2001]: Anxiety Disorder  Depression  Glaucoma  Bronchitis  Childhood Jaundice  Urinary Tract Infection  Osteopenia  Measles  Menopause   Allergies Avelox *FLUOROQUINOLONES*  Sulfanilamide *CHEMICALS*  Sulfa drugs Morphine Sulfate *ANALGESICS - OPIOID*  Very Sick HYDROcodone Bitartrate *CHEMICALS*  Itching. 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 Marital status  Widowed. Copy of Drug/Alcohol Rehab (Previously)  no Children  4 Alcohol use  current drinker; drinks wine Number of flights of stairs before winded  2-3 Tobacco use  current every day smoker; smoke(d) less than 1/2 pack(s) per day Pain Contract  no Tobacco / smoke exposure  yes outdoors only Drug/Alcohol Rehab (Currently)  no Exercise  Exercises rarely; does running / walking Illicit drug use  no Advance Directives  Living Will, Healthcare POA  Medication History  Cetirizine HCl (10MG  Tablet, Oral) Active. Meloxicam (15MG  Tablet, Oral) Active. RaNITidine HCl (150MG  Tablet, Oral) Active. Ipratropium Bromide (0.06% Solution, Nasal) Active. Xanax (0.5MG  Tablet, Oral) Active. Vitamin D3 (1000UNIT Tablet Chewable, Oral) Active. BC Headache (325-16-95MG  Tablet, Oral) Active. Levothyroxine Sodium (25MCG Tablet, Oral) Active.  Past Surgical History  Neck Disc Surgery  Spinal Fusion  neck Cataract Surgery  bilateral Tonsillectomy  Hysterectomy  partial (non-cancerous) Gallbladder Surgery  laporoscopic Breast Biopsy  bilateral, multiple times  Review of Systems General Not Present- Chills, Fatigue, Fever, Memory Loss, Night Sweats, Weight Gain and Weight Loss. Skin Not Present- Eczema, Hives, Itching, Lesions and Rash. HEENT Present- Headache. Not Present- Dentures, Double Vision, Hearing Loss, Tinnitus and Visual Loss. Respiratory Not Present- Allergies, Chronic Cough, Coughing up blood, Shortness of breath at rest and Shortness of breath with exertion. Cardiovascular Not Present- Chest Pain, Difficulty Breathing Lying Down, Murmur, Palpitations, Racing/skipping heartbeats and Swelling. Gastrointestinal Present- Heartburn. Not Present- Abdominal Pain, Bloody Stool, Constipation, Diarrhea, Difficulty Swallowing, Jaundice, Loss of appetitie, Nausea and Vomiting. Female  Genitourinary Present- Urinary frequency. Not Present- Blood in Urine, Discharge, Flank Pain, Incontinence, Painful Urination, Urgency, Urinary Retention, Urinating at Night and Weak urinary stream. Musculoskeletal Present- Joint Pain and Morning Stiffness. Not Present- Back Pain, Joint Swelling, Muscle Pain, Muscle Weakness and Spasms. Neurological Not Present- Blackout spells, Difficulty with balance, Dizziness, Paralysis, Tremor and Weakness. Psychiatric Not Present- Insomnia.  Vitals Weight: 142 lb Height: 63in Body Surface Area: 1.67 m Body Mass Index: 25.15 kg/m  Pulse: 68 (Regular)  BP: 118/60 (Sitting, Right Arm, Standard)   Physical Exam  General Mental Status -Alert, cooperative and good historian. General Appearance-pleasant, Not in acute distress. Orientation-Oriented X3. Build & Nutrition-Well nourished and Well developed.  Head and Neck Head-normocephalic, atraumatic . Neck Global Assessment - supple, no bruit auscultated on the right, no bruit auscultated on the left.  Eye Pupil - Bilateral-Regular and Round. Motion - Bilateral-EOMI.  Chest and Lung Exam Auscultation Breath sounds - clear at anterior chest wall and clear at posterior chest wall. Adventitious sounds - No Adventitious sounds.  Cardiovascular Auscultation Rhythm - Regular rate and rhythm. Heart Sounds - S1 WNL and S2 WNL. Murmurs & Other Heart Sounds - Auscultation of the heart reveals - No Murmurs.  Abdomen Palpation/Percussion Tenderness - Abdomen is non-tender to palpation. Rigidity (guarding) - Abdomen is soft. Auscultation Auscultation of the abdomen reveals - Bowel sounds normal.  Female Genitourinary Note: Not done, not pertinent to present illness   Musculoskeletal Note: Well-developed female, alert and oriented, no apparent distress. Evaluation of her hip shows normal range of motion, no discomfort. Left knee, no swelling, range about 0 to  115-120 with no tenderness or instability. Right knee, no effusion, slight varus, range 5 to 125, moderate crepitus on range of motion. Tender medial greater than lateral, no instability noted. Gait pattern is significantly antalgic on the right.  RADIOGRAPHS  AP both knees and lateral of the right shows significant medial and patellofemoral narrowing in her right knee with osteophyte formation. She does not have bone on bone change, but it is very close to being bone on bone.  Assessment & Plan Primary osteoarthritis of right knee (M17.11)  Note:Surgical Plans: Right Total Knee Replacement  Disposition: Home with hlep from Step-Daughter  PCP: Dr. Gerarda Fraction  IV TXA  Anesthesia Issues: VERY SICK with anesthesia  Patient was instructed on what medications to stop prior to surgery.  Signed electronically by Ok Edwards, III PA-C

## 2016-12-27 ENCOUNTER — Encounter (HOSPITAL_COMMUNITY)
Admission: RE | Disposition: A | Discharge status: Home / Self Care | Payer: Self-pay | Source: Ambulatory Visit | Attending: Orthopedic Surgery

## 2016-12-27 ENCOUNTER — Encounter (HOSPITAL_COMMUNITY): Payer: Self-pay | Admitting: *Deleted

## 2016-12-27 ENCOUNTER — Inpatient Hospital Stay (HOSPITAL_COMMUNITY): Payer: PPO | Admitting: Anesthesiology

## 2016-12-27 ENCOUNTER — Inpatient Hospital Stay (HOSPITAL_COMMUNITY)
Admission: RE | Admit: 2016-12-27 | Discharge: 2016-12-29 | DRG: 470 | Disposition: A | Discharge status: Home / Self Care | Payer: PPO | Source: Ambulatory Visit | Attending: Orthopedic Surgery | Admitting: Orthopedic Surgery

## 2016-12-27 DIAGNOSIS — E039 Hypothyroidism, unspecified: Secondary | ICD-10-CM | POA: Diagnosis not present

## 2016-12-27 DIAGNOSIS — M1711 Unilateral primary osteoarthritis, right knee: Principal | ICD-10-CM | POA: Diagnosis present

## 2016-12-27 DIAGNOSIS — Z8261 Family history of arthritis: Secondary | ICD-10-CM | POA: Diagnosis not present

## 2016-12-27 DIAGNOSIS — Z981 Arthrodesis status: Secondary | ICD-10-CM

## 2016-12-27 DIAGNOSIS — Z809 Family history of malignant neoplasm, unspecified: Secondary | ICD-10-CM | POA: Diagnosis not present

## 2016-12-27 DIAGNOSIS — Z885 Allergy status to narcotic agent status: Secondary | ICD-10-CM | POA: Diagnosis not present

## 2016-12-27 DIAGNOSIS — H409 Unspecified glaucoma: Secondary | ICD-10-CM | POA: Diagnosis present

## 2016-12-27 DIAGNOSIS — Z90711 Acquired absence of uterus with remaining cervical stump: Secondary | ICD-10-CM | POA: Diagnosis not present

## 2016-12-27 DIAGNOSIS — J449 Chronic obstructive pulmonary disease, unspecified: Secondary | ICD-10-CM | POA: Diagnosis not present

## 2016-12-27 DIAGNOSIS — Z9889 Other specified postprocedural states: Secondary | ICD-10-CM

## 2016-12-27 DIAGNOSIS — K219 Gastro-esophageal reflux disease without esophagitis: Secondary | ICD-10-CM | POA: Diagnosis not present

## 2016-12-27 DIAGNOSIS — Z886 Allergy status to analgesic agent status: Secondary | ICD-10-CM | POA: Diagnosis not present

## 2016-12-27 DIAGNOSIS — Z882 Allergy status to sulfonamides status: Secondary | ICD-10-CM | POA: Diagnosis not present

## 2016-12-27 DIAGNOSIS — G8918 Other acute postprocedural pain: Secondary | ICD-10-CM | POA: Diagnosis not present

## 2016-12-27 DIAGNOSIS — M1710 Unilateral primary osteoarthritis, unspecified knee: Secondary | ICD-10-CM

## 2016-12-27 DIAGNOSIS — M179 Osteoarthritis of knee, unspecified: Secondary | ICD-10-CM | POA: Diagnosis present

## 2016-12-27 HISTORY — PX: TOTAL KNEE ARTHROPLASTY: SHX125

## 2016-12-27 LAB — TYPE AND SCREEN
ABO/RH(D): A POS
Antibody Screen: NEGATIVE

## 2016-12-27 SURGERY — ARTHROPLASTY, KNEE, TOTAL
Anesthesia: Spinal | Site: Knee | Laterality: Right

## 2016-12-27 MED ORDER — HYDROMORPHONE HCL 1 MG/ML IJ SOLN
0.5000 mg | INTRAMUSCULAR | Status: DC | PRN
  Administered 2016-12-27 (×4): 0.5 mg via INTRAVENOUS
  Filled 2016-12-27 (×4): qty 0.5

## 2016-12-27 MED ORDER — ALPRAZOLAM 0.25 MG PO TABS: 0.2500 mg | ORAL_TABLET | Freq: Three times a day (TID) | ORAL | Status: DC | PRN

## 2016-12-27 MED ORDER — PROPOFOL 500 MG/50ML IV EMUL
INTRAVENOUS | Status: DC | PRN
  Administered 2016-12-27: 75 ug/kg/min via INTRAVENOUS

## 2016-12-27 MED ORDER — HYDROCODONE-ACETAMINOPHEN 5-325 MG PO TABS
1.0000 | ORAL_TABLET | ORAL | Status: DC | PRN
  Administered 2016-12-27 (×2): 2 via ORAL
  Administered 2016-12-27: 12:00:00 1 via ORAL
  Administered 2016-12-27 – 2016-12-29 (×8): 2 via ORAL
  Filled 2016-12-27 (×8): qty 2
  Filled 2016-12-27: qty 1
  Filled 2016-12-27 (×3): qty 2

## 2016-12-27 MED ORDER — MIDAZOLAM HCL 5 MG/5ML IJ SOLN
INTRAMUSCULAR | Status: DC | PRN
  Administered 2016-12-27 (×2): 1 mg via INTRAVENOUS

## 2016-12-27 MED ORDER — ACETAMINOPHEN 325 MG PO TABS: 650.0000 mg | ORAL_TABLET | Freq: Four times a day (QID) | ORAL | Status: DC | PRN

## 2016-12-27 MED ORDER — STERILE WATER FOR IRRIGATION IR SOLN
Status: DC | PRN
  Administered 2016-12-27: 2000 mL

## 2016-12-27 MED ORDER — BISACODYL 10 MG RE SUPP: 10.0000 mg | Freq: Every day | RECTAL | Status: DC | PRN

## 2016-12-27 MED ORDER — ONDANSETRON HCL 4 MG/2ML IJ SOLN
INTRAMUSCULAR | Status: DC | PRN
  Administered 2016-12-27: 4 mg via INTRAVENOUS

## 2016-12-27 MED ORDER — METOCLOPRAMIDE HCL 5 MG/ML IJ SOLN: 5.0000 mg | Freq: Three times a day (TID) | INTRAMUSCULAR | Status: DC | PRN

## 2016-12-27 MED ORDER — HYDROMORPHONE HCL 1 MG/ML IJ SOLN: 0.2500 mg | INTRAMUSCULAR | Status: DC | PRN

## 2016-12-27 MED ORDER — TRAMADOL HCL 50 MG PO TABS
50.0000 mg | ORAL_TABLET | Freq: Four times a day (QID) | ORAL | Status: DC | PRN
  Administered 2016-12-28: 06:00:00 100 mg via ORAL
  Filled 2016-12-27: qty 2

## 2016-12-27 MED ORDER — DIPHENHYDRAMINE HCL 12.5 MG/5ML PO ELIX
12.5000 mg | ORAL_SOLUTION | ORAL | Status: DC | PRN
  Administered 2016-12-27 (×2): 25 mg via ORAL
  Administered 2016-12-27: 12:00:00 12.5 mg via ORAL
  Administered 2016-12-28 – 2016-12-29 (×4): 25 mg via ORAL
  Filled 2016-12-27 (×6): qty 10
  Filled 2016-12-27: qty 5

## 2016-12-27 MED ORDER — DEXAMETHASONE SODIUM PHOSPHATE 10 MG/ML IJ SOLN
10.0000 mg | Freq: Once | INTRAMUSCULAR | Status: AC
  Administered 2016-12-27: 8 mg via INTRAVENOUS

## 2016-12-27 MED ORDER — PHENOL 1.4 % MT LIQD: 1.0000 | OROMUCOSAL | Status: DC | PRN

## 2016-12-27 MED ORDER — RIVAROXABAN 10 MG PO TABS
10.0000 mg | ORAL_TABLET | Freq: Every day | ORAL | Status: DC
  Administered 2016-12-28 – 2016-12-29 (×2): 10 mg via ORAL
  Filled 2016-12-27 (×2): qty 1

## 2016-12-27 MED ORDER — POLYETHYLENE GLYCOL 3350 17 G PO PACK: 17.0000 g | PACK | Freq: Every day | ORAL | Status: DC | PRN

## 2016-12-27 MED ORDER — IPRATROPIUM BROMIDE 0.06 % NA SOLN: 1.0000 | Freq: Every day | NASAL | Status: DC | PRN

## 2016-12-27 MED ORDER — CEFAZOLIN SODIUM-DEXTROSE 2-4 GM/100ML-% IV SOLN
2.0000 g | INTRAVENOUS | Status: AC
  Administered 2016-12-27: 2 g via INTRAVENOUS
  Filled 2016-12-27: qty 100

## 2016-12-27 MED ORDER — DEXTROSE 5 % IV SOLN: 30.0000 ug/min | INTRAVENOUS | Status: DC

## 2016-12-27 MED ORDER — LACTATED RINGERS IV SOLN
INTRAVENOUS | Status: DC
  Administered 2016-12-27 (×2): via INTRAVENOUS

## 2016-12-27 MED ORDER — PROMETHAZINE HCL 25 MG/ML IJ SOLN: 6.2500 mg | INTRAMUSCULAR | Status: DC | PRN

## 2016-12-27 MED ORDER — MENTHOL 3 MG MT LOZG: 1.0000 | LOZENGE | OROMUCOSAL | Status: DC | PRN

## 2016-12-27 MED ORDER — METOCLOPRAMIDE HCL 5 MG PO TABS: 5.0000 mg | ORAL_TABLET | Freq: Three times a day (TID) | ORAL | Status: DC | PRN

## 2016-12-27 MED ORDER — GABAPENTIN 300 MG PO CAPS
300.0000 mg | ORAL_CAPSULE | Freq: Three times a day (TID) | ORAL | Status: DC
  Administered 2016-12-27 – 2016-12-29 (×7): 300 mg via ORAL
  Filled 2016-12-27 (×7): qty 1

## 2016-12-27 MED ORDER — PHENYLEPHRINE HCL 10 MG/ML IJ SOLN
INTRAMUSCULAR | Status: DC | PRN
  Administered 2016-12-27 (×2): 60 ug via INTRAVENOUS

## 2016-12-27 MED ORDER — PROPOFOL 10 MG/ML IV BOLUS
INTRAVENOUS | Status: AC
  Filled 2016-12-27: qty 40

## 2016-12-27 MED ORDER — SODIUM CHLORIDE 0.9 % IJ SOLN
INTRAMUSCULAR | Status: AC
  Filled 2016-12-27: qty 50

## 2016-12-27 MED ORDER — BUPIVACAINE LIPOSOME 1.3 % IJ SUSP
INTRAMUSCULAR | Status: DC | PRN
Start: 2016-12-27 — End: 2016-12-27
  Administered 2016-12-27: 20 mL

## 2016-12-27 MED ORDER — SODIUM CHLORIDE 0.9 % IJ SOLN
INTRAMUSCULAR | Status: DC | PRN
Start: 2016-12-27 — End: 2016-12-27
  Administered 2016-12-27: 30 mL

## 2016-12-27 MED ORDER — CEFAZOLIN SODIUM-DEXTROSE 2-4 GM/100ML-% IV SOLN
2.0000 g | Freq: Four times a day (QID) | INTRAVENOUS | Status: AC
  Administered 2016-12-27 (×2): 2 g via INTRAVENOUS
  Filled 2016-12-27 (×2): qty 100

## 2016-12-27 MED ORDER — FLEET ENEMA 7-19 GM/118ML RE ENEM: 1.0000 | ENEMA | Freq: Once | RECTAL | Status: DC | PRN

## 2016-12-27 MED ORDER — TRANEXAMIC ACID 1000 MG/10ML IV SOLN
1000.0000 mg | INTRAVENOUS | Status: AC
  Administered 2016-12-27: 1000 mg via INTRAVENOUS
  Filled 2016-12-27: qty 1100

## 2016-12-27 MED ORDER — TRANEXAMIC ACID 1000 MG/10ML IV SOLN
1000.0000 mg | Freq: Once | INTRAVENOUS | Status: AC
  Administered 2016-12-27: 1000 mg via INTRAVENOUS
  Filled 2016-12-27: qty 1100

## 2016-12-27 MED ORDER — LEVOTHYROXINE SODIUM 50 MCG PO TABS
50.0000 ug | ORAL_TABLET | Freq: Every day | ORAL | Status: DC
  Administered 2016-12-28 – 2016-12-29 (×2): 50 ug via ORAL
  Filled 2016-12-27 (×2): qty 1

## 2016-12-27 MED ORDER — METHOCARBAMOL 1000 MG/10ML IJ SOLN
500.0000 mg | Freq: Four times a day (QID) | INTRAVENOUS | Status: DC | PRN
  Administered 2016-12-27: 500 mg via INTRAVENOUS
  Filled 2016-12-27: qty 5
  Filled 2016-12-27: qty 550

## 2016-12-27 MED ORDER — ACETAMINOPHEN 10 MG/ML IV SOLN
1000.0000 mg | Freq: Once | INTRAVENOUS | Status: AC
  Administered 2016-12-27: 1000 mg via INTRAVENOUS
  Filled 2016-12-27: qty 100

## 2016-12-27 MED ORDER — ROPIVACAINE HCL 5 MG/ML IJ SOLN
INTRAMUSCULAR | Status: DC | PRN
  Administered 2016-12-27: 30 mL via PERINEURAL

## 2016-12-27 MED ORDER — DEXAMETHASONE SODIUM PHOSPHATE 10 MG/ML IJ SOLN
10.0000 mg | Freq: Once | INTRAMUSCULAR | Status: AC
  Administered 2016-12-28: 10 mg via INTRAVENOUS
  Filled 2016-12-27: qty 1

## 2016-12-27 MED ORDER — BUPIVACAINE IN DEXTROSE 0.75-8.25 % IT SOLN
INTRATHECAL | Status: DC | PRN
  Administered 2016-12-27: 1.8 mL via INTRATHECAL

## 2016-12-27 MED ORDER — PHENYLEPHRINE HCL 10 MG/ML IJ SOLN
INTRAMUSCULAR | Status: AC
  Filled 2016-12-27: qty 1

## 2016-12-27 MED ORDER — DEXTROSE 5 % IV SOLN
INTRAVENOUS | Status: DC | PRN
  Administered 2016-12-27: 20 ug/min via INTRAVENOUS

## 2016-12-27 MED ORDER — MIDAZOLAM HCL 2 MG/2ML IJ SOLN
INTRAMUSCULAR | Status: AC
  Filled 2016-12-27: qty 2

## 2016-12-27 MED ORDER — DEXAMETHASONE SODIUM PHOSPHATE 10 MG/ML IJ SOLN
INTRAMUSCULAR | Status: AC
  Filled 2016-12-27: qty 1

## 2016-12-27 MED ORDER — 0.9 % SODIUM CHLORIDE (POUR BTL) OPTIME
TOPICAL | Status: DC | PRN
  Administered 2016-12-27: 1000 mL

## 2016-12-27 MED ORDER — SODIUM CHLORIDE 0.9 % IR SOLN
Status: DC | PRN
  Administered 2016-12-27: 1000 mL

## 2016-12-27 MED ORDER — FAMOTIDINE 20 MG PO TABS
20.0000 mg | ORAL_TABLET | Freq: Every day | ORAL | Status: DC
  Administered 2016-12-27 – 2016-12-28 (×2): 20 mg via ORAL
  Filled 2016-12-27 (×2): qty 1

## 2016-12-27 MED ORDER — ONDANSETRON HCL 4 MG/2ML IJ SOLN
INTRAMUSCULAR | Status: AC
  Filled 2016-12-27: qty 2

## 2016-12-27 MED ORDER — ONDANSETRON HCL 4 MG PO TABS: 4.0000 mg | ORAL_TABLET | Freq: Four times a day (QID) | ORAL | Status: DC | PRN

## 2016-12-27 MED ORDER — BUPIVACAINE HCL (PF) 0.25 % IJ SOLN
INTRAMUSCULAR | Status: AC
  Filled 2016-12-27: qty 30

## 2016-12-27 MED ORDER — DOCUSATE SODIUM 100 MG PO CAPS
100.0000 mg | ORAL_CAPSULE | Freq: Two times a day (BID) | ORAL | Status: DC
Start: 2016-12-27 — End: 2016-12-29
  Administered 2016-12-27 – 2016-12-29 (×5): 100 mg via ORAL
  Filled 2016-12-27 (×5): qty 1

## 2016-12-27 MED ORDER — ACETAMINOPHEN 650 MG RE SUPP: 650.0000 mg | Freq: Four times a day (QID) | RECTAL | Status: DC | PRN

## 2016-12-27 MED ORDER — SODIUM CHLORIDE 0.9 % IV SOLN
INTRAVENOUS | Status: DC
  Administered 2016-12-27: 12:00:00 via INTRAVENOUS

## 2016-12-27 MED ORDER — LORATADINE 10 MG PO TABS
10.0000 mg | ORAL_TABLET | Freq: Every day | ORAL | Status: DC
  Administered 2016-12-27 – 2016-12-28 (×2): 10 mg via ORAL
  Filled 2016-12-27 (×2): qty 1

## 2016-12-27 MED ORDER — BUPIVACAINE LIPOSOME 1.3 % IJ SUSP
20.0000 mL | Freq: Once | INTRAMUSCULAR | Status: DC
  Filled 2016-12-27: qty 20

## 2016-12-27 MED ORDER — ONDANSETRON HCL 4 MG/2ML IJ SOLN: 4.0000 mg | Freq: Four times a day (QID) | INTRAMUSCULAR | Status: DC | PRN

## 2016-12-27 MED ORDER — METHOCARBAMOL 500 MG PO TABS
500.0000 mg | ORAL_TABLET | Freq: Four times a day (QID) | ORAL | Status: DC | PRN
  Administered 2016-12-27 – 2016-12-28 (×2): 500 mg via ORAL
  Filled 2016-12-27 (×2): qty 1

## 2016-12-27 SURGICAL SUPPLY — 49 items
BAG DECANTER FOR FLEXI CONT (MISCELLANEOUS) ×2 IMPLANT
BAG SPEC THK2 15X12 ZIP CLS (MISCELLANEOUS) ×1
BAG ZIPLOCK 12X15 (MISCELLANEOUS) ×2 IMPLANT
BANDAGE ACE 6X5 VEL STRL LF (GAUZE/BANDAGES/DRESSINGS) ×2 IMPLANT
BLADE SAG 18X100X1.27 (BLADE) ×2 IMPLANT
BLADE SAW SGTL 11.0X1.19X90.0M (BLADE) ×2 IMPLANT
BOWL SMART MIX CTS (DISPOSABLE) ×2 IMPLANT
CAP KNEE TOTAL 3 SIGMA ×1 IMPLANT
CEMENT HV SMART SET (Cement) ×4 IMPLANT
CLOTH BEACON ORANGE TIMEOUT ST (SAFETY) ×2 IMPLANT
CUFF TOURN SGL QUICK 34 (TOURNIQUET CUFF) ×2
CUFF TRNQT CYL 34X4X40X1 (TOURNIQUET CUFF) ×1 IMPLANT
DECANTER SPIKE VIAL GLASS SM (MISCELLANEOUS) ×2 IMPLANT
DRAPE U-SHAPE 47X51 STRL (DRAPES) ×2 IMPLANT
DRSG ADAPTIC 3X8 NADH LF (GAUZE/BANDAGES/DRESSINGS) ×2 IMPLANT
DURAPREP 26ML APPLICATOR (WOUND CARE) ×2 IMPLANT
ELECT REM PT RETURN 9FT ADLT (ELECTROSURGICAL) ×2
ELECTRODE REM PT RTRN 9FT ADLT (ELECTROSURGICAL) ×1 IMPLANT
EVACUATOR 1/8 PVC DRAIN (DRAIN) ×2 IMPLANT
GAUZE SPONGE 4X4 12PLY STRL (GAUZE/BANDAGES/DRESSINGS) ×2 IMPLANT
GLOVE BIO SURGEON STRL SZ 6.5 (GLOVE) ×1 IMPLANT
GLOVE BIO SURGEON STRL SZ7.5 (GLOVE) ×1 IMPLANT
GLOVE BIO SURGEON STRL SZ8 (GLOVE) ×2 IMPLANT
GLOVE BIOGEL PI IND STRL 6.5 (GLOVE) IMPLANT
GLOVE BIOGEL PI IND STRL 7.5 (GLOVE) IMPLANT
GLOVE BIOGEL PI IND STRL 8 (GLOVE) ×1 IMPLANT
GLOVE BIOGEL PI INDICATOR 6.5 (GLOVE) ×1
GLOVE BIOGEL PI INDICATOR 7.5 (GLOVE) ×4
GLOVE BIOGEL PI INDICATOR 8 (GLOVE) ×2
GOWN STRL REUS W/TWL LRG LVL3 (GOWN DISPOSABLE) ×3 IMPLANT
GOWN STRL REUS W/TWL XL LVL3 (GOWN DISPOSABLE) ×2 IMPLANT
HANDPIECE INTERPULSE COAX TIP (DISPOSABLE) ×2
IMMOBILIZER KNEE 20 (SOFTGOODS) ×2
IMMOBILIZER KNEE 20 THIGH 36 (SOFTGOODS) ×1 IMPLANT
MANIFOLD NEPTUNE II (INSTRUMENTS) ×2 IMPLANT
PACK TOTAL KNEE CUSTOM (KITS) ×2 IMPLANT
PAD ABD 8X10 STRL (GAUZE/BANDAGES/DRESSINGS) ×1 IMPLANT
PADDING CAST COTTON 6X4 STRL (CAST SUPPLIES) ×6 IMPLANT
POSITIONER SURGICAL ARM (MISCELLANEOUS) ×2 IMPLANT
SET HNDPC FAN SPRY TIP SCT (DISPOSABLE) ×1 IMPLANT
STRIP CLOSURE SKIN 1/2X4 (GAUZE/BANDAGES/DRESSINGS) ×4 IMPLANT
SUT MNCRL AB 4-0 PS2 18 (SUTURE) ×2 IMPLANT
SUT STRATAFIX 0 PDS 27 VIOLET (SUTURE) ×4
SUT VIC AB 2-0 CT1 27 (SUTURE) ×6
SUT VIC AB 2-0 CT1 TAPERPNT 27 (SUTURE) ×3 IMPLANT
SUTURE STRATFX 0 PDS 27 VIOLET (SUTURE) ×1 IMPLANT
TRAY FOLEY CATH SILVER 14FR (SET/KITS/TRAYS/PACK) ×1 IMPLANT
WRAP KNEE MAXI GEL POST OP (GAUZE/BANDAGES/DRESSINGS) ×2 IMPLANT
YANKAUER SUCT BULB TIP 10FT TU (MISCELLANEOUS) ×2 IMPLANT

## 2016-12-27 NOTE — Interval H&P Note (Signed)
History and Physical Interval Note:  12/27/2016 6:30 AM  Mary Hart  has presented today for surgery, with the diagnosis of right knee osteoarthritis  The various methods of treatment have been discussed with the patient and family. After consideration of risks, benefits and other options for treatment, the patient has consented to  Procedure(s): RIGHT TOTAL KNEE ARTHROPLASTY (Right) as a surgical intervention .  The patient's history has been reviewed, patient examined, no change in status, stable for surgery.  I have reviewed the patient's chart and labs.  Questions were answered to the patient's satisfaction.     Gearlean Alf

## 2016-12-27 NOTE — Evaluation (Signed)
Physical Therapy Evaluation Patient Details Name: Mary Hart MRN: [id number] DOB: 04-04-1941 Today's Date: 12/27/2016   History of Present Illness  76 yo female s/p R TKA 12/27/16  Clinical Impression  On eval POD 0, pt required Min assist for mobility. She walked ~125 feet with a RW. Pain rated 5/10 with activity. Pt tolerated activity well. Will continue to follow and progress activity as tolerated.     Follow Up Recommendations Home health PT;Outpatient PT;Supervision - Intermittent (depending on MD preference)    Equipment Recommendations  None recommended by PT    Recommendations for Other Services       Precautions / Restrictions Precautions Precautions: Fall;Knee Required Braces or Orthoses: Knee Immobilizer - Right Knee Immobilizer - Right: Discontinue once straight leg raise with < 10 degree lag Restrictions Weight Bearing Restrictions: No RLE Weight Bearing: Weight bearing as tolerated      Mobility  Bed Mobility Overal bed mobility: Needs Assistance Bed Mobility: Supine to Sit     Supine to sit: Min assist     General bed mobility comments: small amount of assist for R LE  Transfers Overall transfer level: Needs assistance Equipment used: Rolling walker (2 wheeled) Transfers: Sit to/from Stand Sit to Stand: Min assist         General transfer comment: VCs safety, technique, hand/LE placement. Assist to rise, stabilize, control descent.   Ambulation/Gait Ambulation/Gait assistance: Min guard Ambulation Distance (Feet): 125 Feet Assistive device: Rolling walker (2 wheeled) Gait Pattern/deviations: Step-to pattern;Antalgic     General Gait Details: VCs safety, technique, sequence. close guard for safety.   Stairs            Wheelchair Mobility    Modified Rankin (Stroke Patients Only)       Balance                                             Pertinent Vitals/Pain Pain Assessment: 0-10 Pain Score: 5  Pain  Location: R knee Pain Descriptors / Indicators: Aching;Sore Pain Intervention(s): Monitored during session;Repositioned;Ice applied    Home Living Family/patient expects to be discharged to:: Private residence Living Arrangements: Alone Available Help at Discharge: Family Type of Home: House Home Access: Stairs to enter   [employer] of Steps: 1 Home Layout: Two level;Able to live on main level with bedroom/bathroom Home Equipment: Gilford Rile - 2 wheels      Prior Function Level of Independence: Independent               Hand Dominance        Extremity/Trunk Assessment   Upper Extremity Assessment Upper Extremity Assessment: Defer to OT evaluation    Lower Extremity Assessment Lower Extremity Assessment: Generalized weakness (s/p R TKA)    Cervical / Trunk Assessment Cervical / Trunk Assessment: Normal  Communication   Communication: No difficulties  Cognition Arousal/Alertness: Awake/alert Behavior During Therapy: WFL for tasks assessed/performed Overall Cognitive Status: Within Functional Limits for tasks assessed                      General Comments      Exercises     Assessment/Plan    PT Assessment Patient needs continued PT services  PT Problem List Decreased strength;Decreased mobility;Decreased range of motion;Decreased activity tolerance;Decreased balance;Decreased knowledge of use of DME;Pain       PT Treatment Interventions DME  instruction;Therapeutic activities;Gait training;Therapeutic exercise;Functional mobility training;Balance training;Stair training;Patient/family education    PT Goals (Current goals can be found in the Care Plan section)  Acute Rehab PT Goals Patient Stated Goal: home PT Goal Formulation: With patient Time For Goal Achievement: 01/10/17 Potential to Achieve Goals: Good    Frequency 7X/week   Barriers to discharge        Co-evaluation               End of Session Equipment Utilized  During Treatment: Gait belt Activity Tolerance: Patient tolerated treatment well Patient left: in chair;with call bell/phone within reach;with family/visitor present   PT Visit Diagnosis: Difficulty in walking, not elsewhere classified (R26.2)         Time: [id number] PT Time Calculation (min) (ACUTE ONLY): 19 min   Charges:   PT Evaluation $PT Eval Low Complexity: 1 Procedure     PT G Codes:         Weston Anna, MPT Pager: [phone]

## 2016-12-27 NOTE — H&P (View-Only) (Signed)
Mary Hart DOB: June 17, 1941 Married / Language: English / Race: White Female Date of Admission: 12/27/2016 CC:  Right Knee Pain History of Present Illness  The patient is a 76 year old female who comes in  for a preoperative History and Physical. The patient is scheduled for a right total knee arthroplasty to be performed by Dr. Dione Plover. Aluisio, MD at [facility] on 12-27-2016. The patient is a 76 year old female who presented for their right knee pain and osteoarthritis. They are now 2 year(s) out from when symptoms began (pain ever since sx). Symptoms reported include: pain, pain after sitting, pain at night, swelling, aching, stiffness, instability, difficulty ambulating and difficulty arising from chair. The patient feels that they are doing poorly and report their pain level to be moderate. Current treatment includes: relative rest and icing. The following medication has been used for pain control: none. The patient has not gotten any relief of their symptoms with Cortisone injections or viscosupplementation. The patient has had a right knee scope in 2015 by Dr. Veverly Fells. She has significant pain and dysfunction in her right knee. She had an arthroscopy done by Dr. Veverly Fells a few years ago, did not get complete relief. She was told she may need another arthroscopy, but felt as though since the first one did not help that another would not be of benefit. The knee is hurting at all times. It is limiting what she can and cannot do. It does occasionally want to give out on her. It does hurt at night. She has had cortisone and viscosupplement injections without any long lasting benefit.  Radiographs taken in the office of both knees and lateral of the right shows significant medial and patellofemoral narrowing in her right knee with osteophyte formation. She does not have bone on bone change, but it is very close to being bone on bone. She has got advanced arthritic change with progressive pain  and dysfunction. She has had an arthroscopy in the past. She did have cortisone and viscosupplement injections in the past without benefit. At this point, the most predictable means of getting her better would be total knee arthroplasty. The procedure, risks, potential complications and rehab course are discussed in detail and the patient elects to proceed. They have been treated conservatively in the past for the above stated problem and despite conservative measures, they continue to have progressive pain and severe functional limitations and dysfunction. They have failed non-operative management including home exercise, medications, and injections. It is felt that they would benefit from undergoing total joint replacement. Risks and benefits of the procedure have been discussed with the patient and they elect to proceed with surgery. There are no active contraindications to surgery such as ongoing infection or rapidly progressive neurological disease.  Problem List/Past Medical Closed nondisplaced fracture of lateral malleolus of left fibula with routine healing, subsequent encounter (S82.65XD)  Fracture of lateral malleolus [id number])  Wrist fracture (814.00)  Primary osteoarthritis of right knee (M17.11)  Fracture of tibial plateau [id number])  Acute medial meniscal tear [id number])  Osteoarthritis  Gastroesophageal Reflux Disease  Hypothyroidism  Degeneration, lumbar/lumbosacral disc (722.52) [11/29/2001]: Anxiety Disorder  Depression  Glaucoma  Bronchitis  Childhood Jaundice  Urinary Tract Infection  Osteopenia  Measles  Menopause   Allergies Avelox *FLUOROQUINOLONES*  Sulfanilamide *CHEMICALS*  Sulfa drugs Morphine Sulfate *ANALGESICS - OPIOID*  Very Sick HYDROcodone Bitartrate *CHEMICALS*  Itching. 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 Marital status  Widowed. Copy of Drug/Alcohol Rehab (Previously)  no Children  4 Alcohol use  current drinker; drinks wine Number of flights of stairs before winded  2-3 Tobacco use  current every day smoker; smoke(d) less than 1/2 pack(s) per day Pain Contract  no Tobacco / smoke exposure  yes outdoors only Drug/Alcohol Rehab (Currently)  no Exercise  Exercises rarely; does running / walking Illicit drug use  no Advance Directives  Living Will, Healthcare POA  Medication History  Cetirizine HCl (10MG  Tablet, Oral) Active. Meloxicam (15MG  Tablet, Oral) Active. RaNITidine HCl (150MG  Tablet, Oral) Active. Ipratropium Bromide (0.06% Solution, Nasal) Active. Xanax (0.5MG  Tablet, Oral) Active. Vitamin D3 (1000UNIT Tablet Chewable, Oral) Active. BC Headache (325-16-95MG  Tablet, Oral) Active. Levothyroxine Sodium (25MCG Tablet, Oral) Active.  Past Surgical History  Neck Disc Surgery  Spinal Fusion  neck Cataract Surgery  bilateral Tonsillectomy  Hysterectomy  partial (non-cancerous) Gallbladder Surgery  laporoscopic Breast Biopsy  bilateral, multiple times  Review of Systems General Not Present- Chills, Fatigue, Fever, Memory Loss, Night Sweats, Weight Gain and Weight Loss. Skin Not Present- Eczema, Hives, Itching, Lesions and Rash. HEENT Present- Headache. Not Present- Dentures, Double Vision, Hearing Loss, Tinnitus and Visual Loss. Respiratory Not Present- Allergies, Chronic Cough, Coughing up blood, Shortness of breath at rest and Shortness of breath with exertion. Cardiovascular Not Present- Chest Pain, Difficulty Breathing Lying Down, Murmur, Palpitations, Racing/skipping heartbeats and Swelling. Gastrointestinal Present- Heartburn. Not Present- Abdominal Pain, Bloody Stool, Constipation, Diarrhea, Difficulty Swallowing, Jaundice, Loss of appetitie, Nausea and Vomiting. Female  Genitourinary Present- Urinary frequency. Not Present- Blood in Urine, Discharge, Flank Pain, Incontinence, Painful Urination, Urgency, Urinary Retention, Urinating at Night and Weak urinary stream. Musculoskeletal Present- Joint Pain and Morning Stiffness. Not Present- Back Pain, Joint Swelling, Muscle Pain, Muscle Weakness and Spasms. Neurological Not Present- Blackout spells, Difficulty with balance, Dizziness, Paralysis, Tremor and Weakness. Psychiatric Not Present- Insomnia.  Vitals Weight: 142 lb Height: 63in Body Surface Area: 1.67 m Body Mass Index: 25.15 kg/m  Pulse: 68 (Regular)  BP: 118/60 (Sitting, Right Arm, Standard)   Physical Exam  General Mental Status -Alert, cooperative and good historian. General Appearance-pleasant, Not in acute distress. Orientation-Oriented X3. Build & Nutrition-Well nourished and Well developed.  Head and Neck Head-normocephalic, atraumatic . Neck Global Assessment - supple, no bruit auscultated on the right, no bruit auscultated on the left.  Eye Pupil - Bilateral-Regular and Round. Motion - Bilateral-EOMI.  Chest and Lung Exam Auscultation Breath sounds - clear at anterior chest wall and clear at posterior chest wall. Adventitious sounds - No Adventitious sounds.  Cardiovascular Auscultation Rhythm - Regular rate and rhythm. Heart Sounds - S1 WNL and S2 WNL. Murmurs & Other Heart Sounds - Auscultation of the heart reveals - No Murmurs.  Abdomen Palpation/Percussion Tenderness - Abdomen is non-tender to palpation. Rigidity (guarding) - Abdomen is soft. Auscultation Auscultation of the abdomen reveals - Bowel sounds normal.  Female Genitourinary Note: Not done, not pertinent to present illness   Musculoskeletal Note: Well-developed female, alert and oriented, no apparent distress. Evaluation of her hip shows normal range of motion, no discomfort. Left knee, no swelling, range about 0 to  115-120 with no tenderness or instability. Right knee, no effusion, slight varus, range 5 to 125, moderate crepitus on range of motion. Tender medial greater than lateral, no instability noted. Gait pattern is significantly antalgic on the right.  RADIOGRAPHS  AP both knees and lateral of the right shows significant medial and patellofemoral narrowing in her right knee with osteophyte formation. She does not have bone on bone change, but it is very close to being bone on bone.  Assessment & Plan Primary osteoarthritis of right knee (M17.11)  Note:Surgical Plans: Right Total Knee Replacement  Disposition: Home with hlep from [facility]  PCP: Dr. Gerarda Fraction  IV TXA  Anesthesia Issues: VERY SICK with anesthesia  Patient was instructed on what medications to stop prior to surgery.  Signed electronically by Ok Edwards, III PA-C

## 2016-12-27 NOTE — Anesthesia Procedure Notes (Signed)
Anesthesia Regional Block: Adductor canal block   Pre-Anesthetic Checklist: ,, timeout performed, Correct Patient, Correct Site, Correct Laterality, Correct Procedure, Correct Position, site marked, Risks and benefits discussed,  Surgical consent,  Pre-op evaluation,  At surgeon's request and post-op pain management  Laterality: Right  Prep: chloraprep       Needles:  Injection technique: Single-shot  Needle Type: Echogenic Needle     Needle Length: 9cm      Additional Needles:   Procedures: ultrasound guided,,,,,,,,  Narrative:  Start time: 12/27/2016 7:50 AM End time: 12/27/2016 7:58 AM Injection made incrementally with aspirations every 5 mL.  Performed by: Personally  Anesthesiologist: Cortlandt Capuano  Additional Notes: Patient tolerated the procedure well without complications

## 2016-12-27 NOTE — Transfer of Care (Signed)
Immediate Anesthesia Transfer of Care Note  Patient: Mary Hart  Procedure(s) Performed: Procedure(s) with comments: RIGHT TOTAL KNEE ARTHROPLASTY (Right) - Adductor Block  Patient Location: PACU  Anesthesia Type:Spinal  Level of Consciousness: awake, alert  and oriented  Airway & Oxygen Therapy: Patient Spontanous Breathing and Patient connected to face mask oxygen  Post-op Assessment: Report given to RN and Post -op Vital signs reviewed and stable  Post vital signs: Reviewed and stable  Last Vitals:  Vitals:   12/27/16 0634  BP: 132/86  Pulse: 61  Resp: 18  Temp: 36.5 C    Last Pain:  Vitals:   12/27/16 0634  TempSrc: Oral  PainSc:       Patients Stated Pain Goal: 5 ([id number])  Complications: No apparent anesthesia complications

## 2016-12-27 NOTE — Anesthesia Preprocedure Evaluation (Addendum)
Anesthesia Evaluation  Patient identified by MRN, date of birth, ID band Patient awake    Reviewed: Allergy & Precautions, NPO status , Patient's Chart, lab work & pertinent test results  History of Anesthesia Complications (+) PONV  Airway Mallampati: II  TM Distance: >3 FB Neck ROM: Limited    Dental no notable dental hx.    Pulmonary COPD, former smoker,    Pulmonary exam normal breath sounds clear to auscultation       Cardiovascular negative cardio ROS Normal cardiovascular exam Rhythm:Regular Rate:Normal     Neuro/Psych negative neurological ROS  negative psych ROS   GI/Hepatic negative GI ROS, Neg liver ROS, GERD  ,  Endo/Other  negative endocrine ROSHypothyroidism   Renal/GU negative Renal ROS  negative genitourinary   Musculoskeletal negative musculoskeletal ROS (+)   Abdominal   Peds negative pediatric ROS (+)  Hematology negative hematology ROS (+)   Anesthesia Other Findings   Reproductive/Obstetrics negative OB ROS                             Anesthesia Physical Anesthesia Plan  ASA: III  Anesthesia Plan: Spinal   Post-op Pain Management:    Induction: Intravenous  Airway Management Planned: Simple Face Mask  Additional Equipment:   Intra-op Plan:   Post-operative Plan:   Informed Consent: I have reviewed the patients History and Physical, chart, labs and discussed the procedure including the risks, benefits and alternatives for the proposed anesthesia with the patient or authorized representative who has indicated his/her understanding and acceptance.   Dental advisory given  Plan Discussed with: CRNA and Surgeon  Anesthesia Plan Comments:         Anesthesia Quick Evaluation

## 2016-12-27 NOTE — Anesthesia Postprocedure Evaluation (Addendum)
Anesthesia Post Note  Patient: Mary Hart  Procedure(s) Performed: Procedure(s) (LRB): RIGHT TOTAL KNEE ARTHROPLASTY (Right)  Patient location during evaluation: PACU Anesthesia Type: Spinal Level of consciousness: oriented and awake and alert Pain management: pain level controlled Vital Signs Assessment: post-procedure vital signs reviewed and stable Respiratory status: spontaneous breathing, respiratory function stable and patient connected to nasal cannula oxygen Cardiovascular status: blood pressure returned to baseline and stable Postop Assessment: no headache and no backache Anesthetic complications: no       Last Vitals:  Vitals:   12/27/16 1000 12/27/16 1015  BP: 126/75 (!) 127/94  Pulse: (!) 57 (!) 57  Resp: 19 15  Temp:      Last Pain:  Vitals:   12/27/16 1015  TempSrc:   PainSc: 0-No pain    LLE Motor Response: Purposeful movement (12/27/16 1015) LLE Sensation: Tingling (12/27/16 1015) RLE Motor Response: Purposeful movement (12/27/16 1015) RLE Sensation: Tingling (12/27/16 1015) L Sensory Level: L4-Anterior knee, lower leg (12/27/16 1015) R Sensory Level: L2-Upper inner thigh, upper buttock (12/27/16 1015)  Makara Lanzo S

## 2016-12-27 NOTE — Anesthesia Procedure Notes (Signed)
Spinal  Patient location during procedure: OR Start time: 12/27/2016 8:20 AM End time: 12/27/2016 8:22 AM Staffing Anesthesiologist: ROSE, Iona Beard Resident/CRNA: Glory Buff Preanesthetic Checklist Completed: patient identified, site marked, surgical consent, pre-op evaluation, timeout performed, IV checked, risks and benefits discussed and monitors and equipment checked Spinal Block Patient position: sitting Prep: DuraPrep Patient monitoring: heart rate, continuous pulse ox and blood pressure Approach: midline Location: L2-3 Injection technique: single-shot Needle Needle type: Pencan  Needle gauge: 24 G Needle length: 10 cm Needle insertion depth: 5 cm Assessment Sensory level: T6 Additional Notes Kit date checked, Stick x 1, +CSF, -heme, -paraesthesia, patient tolerated well.

## 2016-12-27 NOTE — Op Note (Signed)
OPERATIVE REPORT-TOTAL KNEE ARTHROPLASTY   Pre-operative diagnosis- Osteoarthritis  Right knee(s)  Post-operative diagnosis- Osteoarthritis Right knee(s)  Procedure-  Right  Total Knee Arthroplasty  Surgeon- Dione Plover. Morris Longenecker, MD  Assistant- Arlee Muslim, PA-C   Anesthesia-  Adductor canal block and Spinal  EBL-* No blood loss amount entered *   Drains Hemovac  Tourniquet time-26 minutes @ [id number] mm Hg  Complications- None  Condition-PACU - hemodynamically stable.   Brief Clinical Note  Mary Hart is a 76 y.o. year old female with end stage OA of her right knee with progressively worsening pain and dysfunction. She has constant pain, with activity and at rest and significant functional deficits with difficulties even with ADLs. She has had extensive non-op management including analgesics, injections of cortisone and viscosupplements, and home exercise program, but remains in significant pain with significant dysfunction.Radiographs show bone on bone arthritis lateral. She presents now for right Total Knee Arthroplasty.    Procedure in detail---   The patient is brought into the operating room and positioned supine on the operating table. After successful administration of  Adductor canal block and Spinal,   a tourniquet is placed high on the  Right thigh(s) and the lower extremity is prepped and draped in the usual sterile fashion. Time out is performed by the operating team and then the  Right lower extremity is wrapped in Esmarch, knee flexed and the tourniquet inflated to 300 mmHg.       A midline incision is made with a ten blade through the subcutaneous tissue to the level of the extensor mechanism. A fresh blade is used to make a medial parapatellar arthrotomy. Soft tissue over the proximal medial tibia is subperiosteally elevated to the joint line with a knife and into the semimembranosus bursa with a Cobb elevator. Soft tissue over the proximal lateral tibia is elevated  with attention being paid to avoiding the patellar tendon on the tibial tubercle. The patella is everted, knee flexed 90 degrees and the ACL and PCL are removed. Findings are bone on bone lateral and patellofemoral with large glateral osteophytes.        The drill is used to create a starting hole in the distal femur and the canal is thoroughly irrigated with sterile saline to remove the fatty contents. The 5 degree Right  valgus alignment guide is placed into the femoral canal and the distal femoral cutting block is pinned to remove 10 mm off the distal femur. Resection is made with an oscillating saw.      The tibia is subluxed forward and the menisci are removed. The extramedullary alignment guide is placed referencing proximally at the medial aspect of the tibial tubercle and distally along the second metatarsal axis and tibial crest. The block is pinned to remove 23mm off the more deficient lateral  side. Resection is made with an oscillating saw. Size 3is the most appropriate size for the tibia and the proximal tibia is prepared with the modular drill and keel punch for that size.      The femoral sizing guide is placed and size 4 is most appropriate. Rotation is marked off the epicondylar axis and confirmed by creating a rectangular flexion gap at 90 degrees. The size 4 cutting block is pinned in this rotation and the anterior, posterior and chamfer cuts are made with the oscillating saw. The intercondylar block is then placed and that cut is made.      Trial size 3 tibial component, trial size 4  narrow posterior stabilized femur and a 10  mm posterior stabilized rotating platform insert trial is placed. Full extension is achieved with excellent varus/valgus and anterior/posterior balance throughout full range of motion. The patella is everted and thickness measured to be 22  mm. Free hand resection is taken to 12 mm, a 38 template is placed, lug holes are drilled, trial patella is placed, and it tracks  normally. Osteophytes are removed off the posterior femur with the trial in place. All trials are removed and the cut bone surfaces prepared with pulsatile lavage. Cement is mixed and once ready for implantation, the size 3 tibial implant, size  4 narrow posterior stabilized femoral component, and the size 38 patella are cemented in place and the patella is held with the clamp. The trial insert is placed and the knee held in full extension. The Exparel (20 ml mixed with 30 ml saline) is injected into the extensor mechanism, posterior capsule, medial and lateral gutters and subcutaneous tissues.  All extruded cement is removed and once the cement is hard the permanent 10 mm posterior stabilized rotating platform insert is placed into the tibial tray.      The wound is copiously irrigated with saline solution and the extensor mechanism closed over a hemovac drain with #1 V-loc suture. The tourniquet is released for a total tourniquet time of 26  minutes. Flexion against gravity is 140 degrees and the patella tracks normally. Subcutaneous tissue is closed with 2.0 vicryl and subcuticular with running 4.0 Monocryl. The incision is cleaned and dried and steri-strips and a bulky sterile dressing are applied. The limb is placed into a knee immobilizer and the patient is awakened and transported to recovery in stable condition.      Please note that a surgical assistant was a medical necessity for this procedure in order to perform it in a safe and expeditious manner. Surgical assistant was necessary to retract the ligaments and vital neurovascular structures to prevent injury to them and also necessary for proper positioning of the limb to allow for anatomic placement of the prosthesis.   Dione Plover Yailen Zemaitis, MD    12/27/2016, 9:18 AM

## 2016-12-28 LAB — BASIC METABOLIC PANEL
Anion gap: 5 (ref 5–15)
BUN: 9 mg/dL (ref 6–20)
CALCIUM: 8.6 mg/dL — AB (ref 8.9–10.3)
CO2: 27 mmol/L (ref 22–32)
Chloride: 100 mmol/L — ABNORMAL LOW (ref 101–111)
Creatinine, Ser: 0.6 mg/dL (ref 0.44–1.00)
GFR calc Af Amer: >60 mL/min (ref >60)
GLUCOSE: 108 mg/dL — AB (ref 65–99)
POTASSIUM: 4 mmol/L (ref 3.5–5.1)
Sodium: 132 mmol/L — ABNORMAL LOW (ref 135–145)

## 2016-12-28 LAB — CBC
HEMATOCRIT: 34.2 % — AB (ref 36.0–46.0)
Hemoglobin: 11.4 g/dL — ABNORMAL LOW (ref 12.0–15.0)
MCH: 30.3 pg (ref 26.0–34.0)
MCHC: 33.3 g/dL (ref 30.0–36.0)
MCV: 91 fL (ref 78.0–100.0)
PLATELETS: 258 10*3/uL (ref 150–400)
RBC: 3.76 MIL/uL — ABNORMAL LOW (ref 3.87–5.11)
RDW: 14.4 % (ref 11.5–15.5)
WBC: 10.8 10*3/uL — ABNORMAL HIGH (ref 4.0–10.5)

## 2016-12-28 MED ORDER — RIVAROXABAN 10 MG PO TABS: 10.0000 mg | ORAL_TABLET | Freq: Every day | ORAL | 0 refills | Status: DC

## 2016-12-28 MED ORDER — TRAMADOL HCL 50 MG PO TABS: 50.0000 mg | ORAL_TABLET | Freq: Four times a day (QID) | ORAL | 0 refills | Status: DC | PRN

## 2016-12-28 MED ORDER — HYDROCODONE-ACETAMINOPHEN 5-325 MG PO TABS: 1.0000 | ORAL_TABLET | ORAL | 0 refills | Status: DC | PRN

## 2016-12-28 MED ORDER — GABAPENTIN 300 MG PO CAPS: 300.0000 mg | ORAL_CAPSULE | Freq: Three times a day (TID) | ORAL | 0 refills | Status: DC

## 2016-12-28 MED ORDER — METHOCARBAMOL 500 MG PO TABS: 500.0000 mg | ORAL_TABLET | Freq: Four times a day (QID) | ORAL | 0 refills | Status: DC | PRN

## 2016-12-28 NOTE — Discharge Summary (Signed)
Physician Discharge Summary   Patient ID: Mary Hart MRN: [id number] DOB/AGE: 11-16-1940 76 y.o.  Admit date: 12/27/2016 Discharge date: 12/29/2016  Primary Diagnosis:  Osteoarthritis  Right knee(s) Admission Diagnoses:  Past Medical History:  Diagnosis Date  . Anxiety   . Arthritis    oa  . Clostridium difficile infection 2013, 2015   Treated with vancomycin  . COPD (chronic obstructive pulmonary disease) ([place])   . Depression   . Diverticula of colon   . Genital herpes   . GERD (gastroesophageal reflux disease)   . Hypothyroid   . IBS (irritable bowel syndrome)   . Internal hemorrhoids   . Jaundice    as child cause unknown  . Neuropathy (HCC)    fingers both hands  . PONV (postoperative nausea and vomiting)    did well with last neck fusion   . Vocal cord dysfunction    SYASMONIC DYSPHONIA   Discharge Diagnoses:   Principal Problem:   OA (osteoarthritis) of knee  Estimated body mass index is 24.66 kg/m as calculated from the following:   Height as of this encounter: 5' 2.5" (1.588 m).   Weight as of this encounter: 62.1 kg (137 lb).  Procedure:  Procedure(s) (LRB): RIGHT TOTAL KNEE ARTHROPLASTY (Right)   Consults: None  HPI: Mary Hart is a 76 y.o. year old female with end stage OA of her right knee with progressively worsening pain and dysfunction. She has constant pain, with activity and at rest and significant functional deficits with difficulties even with ADLs. She has had extensive non-op management including analgesics, injections of cortisone and viscosupplements, and home exercise program, but remains in significant pain with significant dysfunction.Radiographs show bone on bone arthritis lateral. She presents now for right Total Knee Arthroplasty.   Laboratory Data: Admission on 12/27/2016  Component Date Value Ref Range Status  . WBC 12/28/2016 10.8* 4.0 - 10.5 K/uL Final  . RBC 12/28/2016 3.76* 3.87 - 5.11 MIL/uL Final  . Hemoglobin  12/28/2016 11.4* 12.0 - 15.0 g/dL Final  . HCT 12/28/2016 34.2* 36.0 - 46.0 % Final  . MCV 12/28/2016 91.0  78.0 - 100.0 fL Final  . MCH 12/28/2016 30.3  26.0 - 34.0 pg Final  . MCHC 12/28/2016 33.3  30.0 - 36.0 g/dL Final  . RDW 12/28/2016 14.4  11.5 - 15.5 % Final  . Platelets 12/28/2016 258  150 - 400 K/uL Final  . Sodium 12/28/2016 132* 135 - 145 mmol/L Final  . Potassium 12/28/2016 4.0  3.5 - 5.1 mmol/L Final  . Chloride 12/28/2016 100* 101 - 111 mmol/L Final  . CO2 12/28/2016 27  22 - 32 mmol/L Final  . Glucose, Bld 12/28/2016 108* 65 - 99 mg/dL Final  . BUN 12/28/2016 9  6 - 20 mg/dL Final  . Creatinine, Ser 12/28/2016 0.60  0.44 - 1.00 mg/dL Final  . Calcium 12/28/2016 8.6* 8.9 - 10.3 mg/dL Final  . GFR calc non Af Amer 12/28/2016 >60  >60 mL/min Final  . GFR calc Af Amer 12/28/2016 >60  >60 mL/min Final   Comment: (NOTE) The eGFR has been calculated using the CKD EPI equation. This calculation has not been validated in all clinical situations. eGFR's persistently <60 mL/min signify possible Chronic Kidney Disease.   Georgiann Hahn gap 12/28/2016 5  5 - 15 Final  Hospital Outpatient Visit on 12/21/2016  Component Date Value Ref Range Status  . aPTT 12/21/2016 25  24 - 36 seconds Final  . WBC 12/21/2016 8.6  4.0 -  10.5 K/uL Final  . RBC 12/21/2016 4.52  3.87 - 5.11 MIL/uL Final  . Hemoglobin 12/21/2016 13.8  12.0 - 15.0 g/dL Final  . HCT 12/21/2016 41.0  36.0 - 46.0 % Final  . MCV 12/21/2016 90.7  78.0 - 100.0 fL Final  . MCH 12/21/2016 30.5  26.0 - 34.0 pg Final  . MCHC 12/21/2016 33.7  30.0 - 36.0 g/dL Final  . RDW 12/21/2016 14.6  11.5 - 15.5 % Final  . Platelets 12/21/2016 260  150 - 400 K/uL Final  . Sodium 12/21/2016 136  135 - 145 mmol/L Final  . Potassium 12/21/2016 3.6  3.5 - 5.1 mmol/L Final  . Chloride 12/21/2016 103  101 - 111 mmol/L Final  . CO2 12/21/2016 26  22 - 32 mmol/L Final  . Glucose, Bld 12/21/2016 70  65 - 99 mg/dL Final  . BUN 12/21/2016 16  6 - 20  mg/dL Final  . Creatinine, Ser 12/21/2016 0.72  0.44 - 1.00 mg/dL Final  . Calcium 12/21/2016 8.7* 8.9 - 10.3 mg/dL Final  . Total Protein 12/21/2016 6.2* 6.5 - 8.1 g/dL Final  . Albumin 12/21/2016 3.9  3.5 - 5.0 g/dL Final  . AST 12/21/2016 18  15 - 41 U/L Final  . ALT 12/21/2016 17  14 - 54 U/L Final  . Alkaline Phosphatase 12/21/2016 68  38 - 126 U/L Final  . Total Bilirubin 12/21/2016 0.4  0.3 - 1.2 mg/dL Final  . GFR calc non Af Amer 12/21/2016 >60  >60 mL/min Final  . GFR calc Af Amer 12/21/2016 >60  >60 mL/min Final   Comment: (NOTE) The eGFR has been calculated using the CKD EPI equation. This calculation has not been validated in all clinical situations. eGFR's persistently <60 mL/min signify possible Chronic Kidney Disease.   . Anion gap 12/21/2016 7  5 - 15 Final  . Prothrombin Time 12/21/2016 12.5  11.4 - 15.2 seconds Final  . INR 12/21/2016 0.93   Final  . ABO/RH(D) 12/21/2016 A POS   Final  . Antibody Screen 12/21/2016 NEG   Final  . Sample Expiration 12/21/2016 12/30/2016   Final  . Extend sample reason 12/21/2016 NO TRANSFUSIONS OR PREGNANCY IN THE PAST 3 MONTHS   Final  . MRSA, PCR 12/21/2016 NEGATIVE  NEGATIVE Final  . Staphylococcus aureus 12/21/2016 NEGATIVE  NEGATIVE Final   Comment:        The Xpert SA Assay (FDA approved for NASAL specimens in patients over 19 years of age), is one component of a comprehensive surveillance program.  Test performance has been validated by [facility] for patients greater than or equal to 43 year old. It is not intended to diagnose infection nor to guide or monitor treatment.   . ABO/RH(D) 12/21/2016 A POS   Final  Hospital Outpatient Visit on 12/01/2016  Component Date Value Ref Range Status  . LV PW d 12/01/2016 7.98* 0.6 - 1.1 mm Final  . FS 12/01/2016 47* 28 - 44 % Final  . LA vol 12/01/2016 36.8  mL Final  . LA ID, A-P, ES 12/01/2016 29  mm Final  . IVS/LV PW RATIO, ED 12/01/2016 1.27   Final  . Stroke v  12/01/2016 35  ml Final  . LVOT VTI 12/01/2016 30.7  cm Final  . Reg peak vel 12/01/2016 230  cm/s Final  . RV sys press 12/01/2016 24  mmHg Final  . LV e' LATERAL 12/01/2016 9.03  cm/s Final  . LV E/e' medial 12/01/2016 11.41  Final  . LV E/e'average 12/01/2016 11.41   Final  . LA diam index 12/01/2016 1.7  cm/m2 Final  . LA vol A4C 12/01/2016 36.3  ml Final  . LVOT peak grad rest 12/01/2016 8  mmHg Final  . E decel time 12/01/2016 264  msec Final  . LVOT diameter 12/01/2016 18  mm Final  . LVOT area 12/01/2016 2.54  cm2 Final  . LVOT peak vel 12/01/2016 144  cm/s Final  . LVOT SV 12/01/2016 78.00  mL Final  . Peak grad 12/01/2016 4  mmHg Final  . E/e' ratio 12/01/2016 11.41   Final  . MV pk E vel 12/01/2016 103  m/s Final  . TR max vel 12/01/2016 230  cm/s Final  . MV pk A vel 12/01/2016 94.3  m/s Final  . LV sys vol 12/01/2016 17  14 - 42 mL Final  . LV sys vol index 12/01/2016 10.0  mL/m2 Final  . LV dias vol 12/01/2016 51  46 - 106 mL Final  . LV dias vol index 12/01/2016 30.0  mL/m2 Final  . LA vol index 12/01/2016 21.6  mL/m2 Final  . MV Dec 12/01/2016 264   Final  . LA diam end sys 12/01/2016 29.00  mm Final  . Simpson's disk 12/01/2016 68.00   Final  . TDI e' medial 12/01/2016 8.70   Final  . TDI e' lateral 12/01/2016 9.03   Final  . Lateral S' vel 12/01/2016 14.30  cm/sec Final  . TAPSE 12/01/2016 23.00  mm Final     X-Rays:No results found.  EKG: Orders placed or performed in visit on 11/24/16  . EKG 12-Lead     Hospital Course: Mary Hart is a 76 y.o. who was admitted to [facility]. They were brought to the operating room on 12/27/2016 and underwent Procedure(s): RIGHT TOTAL KNEE ARTHROPLASTY.  Patient tolerated the procedure well and was later transferred to the recovery room and then to the orthopaedic floor for postoperative care.  They were given PO and IV analgesics for pain control following their surgery.  They were given 24 hours of  postoperative antibiotics of  Anti-infectives    Start     Dose/Rate Route Frequency Ordered Stop   12/27/16 1400  ceFAZolin (ANCEF) IVPB 2g/100 mL premix     2 g 200 mL/hr over 30 Minutes Intravenous Every 6 hours 12/27/16 1053 12/27/16 2016   12/27/16 0559  ceFAZolin (ANCEF) IVPB 2g/100 mL premix     2 g 200 mL/hr over 30 Minutes Intravenous On call to O.R. 12/27/16 0559 12/27/16 4854     and started on DVT prophylaxis in the form of Xarelto.   PT and OT were ordered for total joint protocol.  Discharge planning consulted to help with postop disposition and equipment needs.  Patient had a decnt night on the evening of surgery.  They started to get up OOB with therapy on day one. Hemovac drain was pulled without difficulty.  Continued to work with therapy into day two.  Dressing was changed on day two and the incision was healing well.   Patient was seen in rounds on POD 2 and was ready to go home.  Discharge home with home health Diet - Regular diet Follow up - in 2 weeks Activity - WBAT Disposition - Home Condition Upon Discharge - Good D/C Meds - See DC Summary DVT Prophylaxis - Xarelto   Discharge Instructions    Call MD / Call 911  Complete by:  As directed    If you experience chest pain or shortness of breath, CALL 911 and be transported to the hospital emergency room.  If you develope a fever above 101 F, pus (white drainage) or increased drainage or redness at the wound, or calf pain, call your surgeon's office.   Change dressing    Complete by:  As directed    Change dressing daily with sterile 4 x 4 inch gauze dressing and apply TED hose. Do not submerge the incision under water.   Constipation Prevention    Complete by:  As directed    Drink plenty of fluids.  Prune juice may be helpful.  You may use a stool softener, such as Colace (over the counter) 100 mg twice a day.  Use MiraLax (over the counter) for constipation as needed.   Diet - low sodium heart healthy     Complete by:  As directed    Discharge instructions    Complete by:  As directed    Pick up stool softner and laxative for home use following surgery while on pain medications. Do not submerge incision under water. Please use good hand washing techniques while changing dressing each day. May shower starting three days after surgery. Please use a clean towel to pat the incision dry following showers. Continue to use ice for pain and swelling after surgery. Do not use any lotions or creams on the incision until instructed by your surgeon.  Wear both TED hose on both legs during the day every day for three weeks, but may have off at night at home.  Postoperative Constipation Protocol  Constipation - defined medically as fewer than three stools per week and severe constipation as less than one stool per week.  One of the most common issues patients have following surgery is constipation.  Even if you have a regular bowel pattern at home, your normal regimen is likely to be disrupted due to multiple reasons following surgery.  Combination of anesthesia, postoperative narcotics, change in appetite and fluid intake all can affect your bowels.  In order to avoid complications following surgery, here are some recommendations in order to help you during your recovery period.  Colace (docusate) - Pick up an over-the-counter form of Colace or another stool softener and take twice a day as long as you are requiring postoperative pain medications.  Take with a full glass of water daily.  If you experience loose stools or diarrhea, hold the colace until you stool forms back up.  If your symptoms do not get better within 1 week or if they get worse, check with your doctor.  Dulcolax (bisacodyl) - Pick up over-the-counter and take as directed by the product packaging as needed to assist with the movement of your bowels.  Take with a full glass of water.  Use this product as needed if not relieved by Colace  only.   MiraLax (polyethylene glycol) - Pick up over-the-counter to have on hand.  MiraLax is a solution that will increase the amount of water in your bowels to assist with bowel movements.  Take as directed and can mix with a glass of water, juice, soda, coffee, or tea.  Take if you go more than two days without a movement. Do not use MiraLax more than once per day. Call your doctor if you are still constipated or irregular after using this medication for 7 days in a row.  If you continue to have problems with postoperative  constipation, please contact the office for further assistance and recommendations.  If you experience "the worst abdominal pain ever" or develop nausea or vomiting, please contact the office immediatly for further recommendations for treatment.   Take Xarelto for two and a half more weeks, then discontinue Xarelto. Once the patient has completed the blood thinner regimen, then take a Baby 81 mg Aspirin daily for three more weeks.   Do not put a pillow under the knee. Place it under the heel.    Complete by:  As directed    Do not sit on low chairs, stoools or toilet seats, as it may be difficult to get up from low surfaces    Complete by:  As directed    Driving restrictions    Complete by:  As directed    No driving until released by the physician.   Increase activity slowly as tolerated    Complete by:  As directed    Lifting restrictions    Complete by:  As directed    No lifting until released by the physician.   Patient may shower    Complete by:  As directed    You may shower without a dressing once there is no drainage.  Do not wash over the wound.  If drainage remains, do not shower until drainage stops.   TED hose    Complete by:  As directed    Use stockings (TED hose) for 3 weeks on both leg(s).  You may remove them at night for sleeping.   Weight bearing as tolerated    Complete by:  As directed      Allergies as of 12/28/2016      Reactions    Codeine Nausea And Vomiting   Hydromorphone Hcl Itching   Morphine Nausea And Vomiting   Moxifloxacin    N/V/D, skin red. Tolerates Levaquin and Cipro.    Sulfonamide Derivatives Other (See Comments)   "almost died" "pulse went down to 12"      Medication List    STOP taking these medications   BC HEADACHE POWDER PO   cholecalciferol 1000 units tablet Commonly known as:  VITAMIN D   meloxicam 15 MG tablet Commonly known as:  MOBIC   saccharomyces boulardii 250 MG capsule Commonly known as:  FLORASTOR     TAKE these medications   ALPRAZolam 0.25 MG tablet Commonly known as:  XANAX Take 1 tablet by mouth 3 (three) times daily as needed for anxiety.   cetirizine 10 MG tablet Commonly known as:  ZYRTEC Take 10 mg by mouth at bedtime.   gabapentin 300 MG capsule Commonly known as:  NEURONTIN Take 1 capsule (300 mg total) by mouth 3 (three) times daily. Gabapentin 300 mg Protocol Take a 300 mg capsule three times a day for one week, Then a 300 mg capsule twice a day for one week, Then a 300 mg capsule once a day for one week, then discontinue the Gabapentin.   GAS-X PO Take 1 capsule by mouth daily as needed (gas relief).   HYDROcodone-acetaminophen 5-325 MG tablet Commonly known as:  NORCO/VICODIN Take 1-2 tablets by mouth every 4 (four) hours as needed (breakthrough pain).   ipratropium 0.06 % nasal spray Commonly known as:  ATROVENT Place 1-2 sprays into the nose daily as needed (for allergies.).   levothyroxine 50 MCG tablet Commonly known as:  SYNTHROID, LEVOTHROID Take 50 mcg by mouth daily before breakfast.   methocarbamol 500 MG tablet Commonly known as:  ROBAXIN Take 1 tablet (500 mg total) by mouth every 6 (six) hours as needed for muscle spasms.   ranitidine 150 MG capsule Commonly known as:  ZANTAC Take 150 mg by mouth at bedtime.   rivaroxaban 10 MG Tabs tablet Commonly known as:  XARELTO Take 1 tablet (10 mg total) by mouth daily with breakfast.  Take Xarelto for two and a half more weeks following discharge from the hospital, then discontinue Xarelto. Once the patient has completed the blood thinner regimen, then take a Baby 81 mg Aspirin daily for three more weeks. Start taking on:  12/29/2016   traMADol 50 MG tablet Commonly known as:  ULTRAM Take 1-2 tablets (50-100 mg total) by mouth every 6 (six) hours as needed for moderate pain.   UNABLE TO FIND 500 mg 2 (two) times daily. Acyclovir 575m      Follow-up Information    [facility] Follow up.   Why:  SMarzetta Boardhas been requested as your physical therapist Contact information: [address] [place] [phone]       AGearlean Alf MD. Schedule an appointment as soon as possible for a visit on 01/11/2017.   Specialty:  Orthopedic Surgery Contact information: [phone]          Signed: DArlee Muslim PA-C Orthopaedic Surgery 12/28/2016, 8:37 PM

## 2016-12-28 NOTE — Progress Notes (Signed)
  Physical Therapy Treatment Patient Details Name: Mary Hart MRN: [id number] DOB: April 14, 1941 Today's Date: 12/28/2016    History of Present Illness 76 yo female s/p R TKA 12/27/16    PT Comments    Progressing with mobility.    Follow Up Recommendations  Home health PT     Equipment Recommendations  None recommended by PT    Recommendations for Other Services       Precautions / Restrictions Precautions Precautions: Fall;Knee Required Braces or Orthoses: Knee Immobilizer - Right Knee Immobilizer - Right: Discontinue once straight leg raise with < 10 degree lag Restrictions Weight Bearing Restrictions: No RLE Weight Bearing: Weight bearing as tolerated    Mobility  Bed Mobility Overal bed mobility: Needs Assistance Bed Mobility: Supine to Sit;Sit to Supine     Supine to sit: Supervision Sit to supine: Supervision   General bed mobility comments: for safety.   Transfers Overall transfer level: Needs assistance Equipment used: Rolling walker (2 wheeled) Transfers: Sit to/from Stand Sit to Stand: Min guard         General transfer comment: close guard for safety. VCs safety, hand/LE placement  Ambulation/Gait Ambulation/Gait assistance: Min guard Ambulation Distance (Feet): 150 Feet Assistive device: Rolling walker (2 wheeled) Gait Pattern/deviations: Step-to pattern;Step-through pattern;Decreased stride length     General Gait Details: VCs safety, technique, sequence. close guard for safety.    Stairs            Wheelchair Mobility    Modified Rankin (Stroke Patients Only)       Balance                                    Cognition Arousal/Alertness: Awake/alert Behavior During Therapy: WFL for tasks assessed/performed Overall Cognitive Status: Within Functional Limits for tasks assessed                      Exercises      General Comments        Pertinent Vitals/Pain Pain Assessment: 0-10 Pain  Score: 5  Pain Location: R knee Pain Descriptors / Indicators: Aching;Sore Pain Intervention(s): Monitored during session;Repositioned;Ice applied    Home Living                      Prior Function            PT Goals (current goals can now be found in the care plan section) Progress towards PT goals: Progressing toward goals    Frequency    7X/week      PT Plan Current plan remains appropriate    Co-evaluation             End of Session Equipment Utilized During Treatment: Gait belt;Right knee immobilizer Activity Tolerance: Patient tolerated treatment well Patient left: in bed;with call bell/phone within reach;with bed alarm set   PT Visit Diagnosis: Difficulty in walking, not elsewhere classified (R26.2)     Time: [id number] PT Time Calculation (min) (ACUTE ONLY): 14 min  Charges:  $Gait Training: 8-22 mins                    G Codes:       Weston Anna, MPT Pager: [phone]

## 2016-12-28 NOTE — Care Management Note (Signed)
Case Management Note  Patient Details  Name: VAL FARNAM MRN: [id number] Date of Birth: Mar 15, 1941  Subjective/Objective:                  Right  Total Knee Arthroplasty Action/Plan: Discharge planning Expected Discharge Date:                  Expected Discharge Plan:  [place]  In-House Referral:     Discharge planning Services  CM Consult  Post Acute Care Choice:  Home Health Choice offered to:  Patient  DME Arranged:  N/A DME Agency:  [place]:  PT [facility] Agency:  [place]  Status of Service:  Completed, signed off  If discussed at [place] of Stay Meetings, dates discussed:    Additional Comments: CM met with pt in room to offer choice of home health agency. Pt chooses [facility] to render HHPT. Referral called to [facility] rep with request for [place].  Pt states she has all DME needed at home.  NO other CM needs were communicated. Dellie Catholic, RN 12/28/2016, 11:05 AM

## 2016-12-28 NOTE — Progress Notes (Signed)
   Subjective: 1 Day Post-Op Procedure(s) (LRB): RIGHT TOTAL KNEE ARTHROPLASTY (Right) Patient reports pain as mild and moderate.   Patient seen in rounds for Dr. Wynelle Link. Patient is well, but has had some minor complaints of pain in the knee, requiring pain medications We will resume therapy today.  Plan is to go Home after hospital stay.  Objective: Vital signs in last 24 hours: Temp:  [97.5 F (36.4 C)-98.6 F (37 C)] 98.6 F (37 C) (03/06 1340) Pulse Rate:  [59-67] 67 (03/06 1340) Resp:  [16-18] 17 (03/06 1013) BP: (122-147)/(70-79) 147/75 (03/06 1340) SpO2:  [98 %-100 %] 100 % (03/06 1340)  Intake/Output from previous day:  Intake/Output Summary (Last 24 hours) at 12/28/16 2031 Last data filed at 12/28/16 1759  Gross per 24 hour  Intake             1725 ml  Output             4160 ml  Net            -2435 ml    Intake/Output this shift: No intake/output data recorded.  Labs:  Recent Labs  12/28/16 0524  HGB 11.4*    Recent Labs  12/28/16 0524  WBC 10.8*  RBC 3.76*  HCT 34.2*  PLT 258    Recent Labs  12/28/16 0524  NA 132*  K 4.0  CL 100*  CO2 27  BUN 9  CREATININE 0.60  GLUCOSE 108*  CALCIUM 8.6*   No results for input(s): LABPT, INR in the last 72 hours.  EXAM General - Patient is Alert, Appropriate and Oriented Extremity - Neurovascular intact Sensation intact distally Intact pulses distally Dorsiflexion/Plantar flexion intact Dressing - dressing C/D/I Motor Function - intact, moving foot and toes well on exam.  Hemovac pulled without difficulty.  Past Medical History:  Diagnosis Date  . Anxiety   . Arthritis    oa  . Clostridium difficile infection 2013, 2015   Treated with vancomycin  . COPD (chronic obstructive pulmonary disease) ([place])   . Depression   . Diverticula of colon   . Genital herpes   . GERD (gastroesophageal reflux disease)   . Hypothyroid   . IBS (irritable bowel syndrome)   . Internal hemorrhoids   .  Jaundice    as child cause unknown  . Neuropathy (H[facility])    fingers both hands  . PONV (postoperative nausea and vomiting)    did well with last neck fusion   . Vocal cord dysfunction    SYASMONIC DYSPHONIA    Assessment/Plan: 1 Day Post-Op Procedure(s) (LRB): RIGHT TOTAL KNEE ARTHROPLASTY (Right) Principal Problem:   OA (osteoarthritis) of knee  Estimated body mass index is 24.66 kg/m as calculated from the following:   Height as of this encounter: 5' 2.5" (1.588 m).   Weight as of this encounter: 62.1 kg (137 lb). Up with therapy Plan for discharge tomorrow Discharge home with home health  DVT Prophylaxis - Xarelto Weight-Bearing as tolerated to right leg D/C O2 and Pulse OX and try on Room Air  Arlee Muslim, PA-C Orthopaedic Surgery 12/28/2016, 8:31 PM

## 2016-12-28 NOTE — Progress Notes (Signed)
Physical Therapy Treatment Patient Details Name: Mary Hart MRN: [id number] DOB: [birth year] Today's Date: 12/28/2016    History of Present Illness 76 yo female s/p R TKA 12/27/16    PT Comments    Progressing with mobility.    Follow Up Recommendations  Home health PT     Equipment Recommendations  None recommended by PT    Recommendations for Other Services       Precautions / Restrictions Precautions Precautions: Fall;Knee Required Braces or Orthoses: Knee Immobilizer - Right Knee Immobilizer - Right: Discontinue once straight leg raise with < 10 degree lag Restrictions Weight Bearing Restrictions: No RLE Weight Bearing: Weight bearing as tolerated    Mobility  Bed Mobility Overal bed mobility: Needs Assistance Bed Mobility: Supine to Sit;Sit to Supine     Supine to sit: Supervision Sit to supine: Supervision   General bed mobility comments: for safety.   Transfers Overall transfer level: Needs assistance Equipment used: Rolling walker (2 wheeled) Transfers: Sit to/from Stand Sit to Stand: Min guard         General transfer comment: close guard for safety. VCs safety, hand/LE placement  Ambulation/Gait Ambulation/Gait assistance: Min guard Ambulation Distance (Feet): 125 Feet Assistive device: Rolling walker (2 wheeled) Gait Pattern/deviations: Step-to pattern;Step-through pattern;Decreased stride length;Decreased step length - right     General Gait Details: VCs safety, technique, sequence. close guard for safety.    Stairs            Wheelchair Mobility    Modified Rankin (Stroke Patients Only)       Balance                                    Cognition Arousal/Alertness: Awake/alert Behavior During Therapy: WFL for tasks assessed/performed Overall Cognitive Status: Within Functional Limits for tasks assessed                      Exercises Total Joint Exercises Ankle Circles/Pumps: AROM;Both;10  reps;Supine Quad Sets: AROM;Both;10 reps;Supine Heel Slides: AAROM;Right;Supine Hip ABduction/ADduction: AAROM;Right;10 reps;AROM Straight Leg Raises: AROM;AAROM;Right;10 reps;Supine Goniometric ROM: ~10-65 degrees    General Comments        Pertinent Vitals/Pain Pain Assessment: 0-10 Pain Score: 4  Pain Location: R knee Pain Descriptors / Indicators: Aching;Sore Pain Intervention(s): Monitored during session;Ice applied;Repositioned    Home Living Family/patient expects to be discharged to:: Private residence Living Arrangements: Alone Available Help at Discharge: Family Type of Home: House Home Access: Stairs to enter   [place]: Two level;Able to live on main level with bedroom/bathroom Home Equipment: Walker - 2 wheels      Prior Function Level of Independence: Independent          PT Goals (current goals can now be found in the care plan section) Acute Rehab PT Goals Patient Stated Goal: home Progress towards PT goals: Progressing toward goals    Frequency    7X/week      PT Plan Current plan remains appropriate    Co-evaluation             End of Session Equipment Utilized During Treatment: Gait belt;Right knee immobilizer Activity Tolerance: Patient tolerated treatment well Patient left: in bed;with call bell/phone within reach   PT Visit Diagnosis: Difficulty in walking, not elsewhere classified (R26.2)     Time: [id number] PT Time Calculation (min) (ACUTE ONLY): 40 min  Charges:  $Gait Training: 8-22  mins $Therapeutic Exercise: 8-22 mins                    G Codes:       Weston Anna, MPT Pager: [phone]

## 2016-12-28 NOTE — Evaluation (Signed)
Occupational Therapy Evaluation Patient Details Name: ALINE WELDE MRN: [id number] DOB: 11/28/1940 Today's Date: 12/28/2016    History of Present Illness 76 yo female s/p R TKA 12/27/16   Clinical Impression   OT education complete.  No DME needs.  Pt has a 3 n 1,    Follow Up Recommendations  No OT follow up    Equipment Recommendations       Recommendations for Other Services       Precautions / Restrictions Precautions Precautions: Fall;Knee Required Braces or Orthoses: Knee Immobilizer - Right Knee Immobilizer - Right: Discontinue once straight leg raise with < 10 degree lag Restrictions Weight Bearing Restrictions: No RLE Weight Bearing: Weight bearing as tolerated      Mobility Bed Mobility Overal bed mobility: Needs Assistance Bed Mobility: Supine to Sit     Supine to sit: Supervision        Transfers Overall transfer level: Needs assistance Equipment used: Rolling walker (2 wheeled) Transfers: Sit to/from Stand           General transfer comment: VC for hand placement    Balance                                            ADL Overall ADL's : Needs assistance/impaired     Grooming: Standing;Supervision/safety   Upper Body Bathing: Set up;Sitting   Lower Body Bathing: Supervison/ safety;Sit to/from stand;Cueing for safety;Cueing for sequencing   Upper Body Dressing : Set up;Sitting   Lower Body Dressing: Minimal assistance;Sit to/from stand;Cueing for safety;Cueing for sequencing;Cueing for compensatory techniques   Toilet Transfer: Supervision/safety;RW;Ambulation;Cueing for safety;Cueing for sequencing   Toileting- Clothing Manipulation and Hygiene: Supervision/safety;Cueing for safety;Sit to/from stand;Cueing for sequencing     Tub/Shower Transfer Details (indicate cue type and reason): verbalized safety Functional mobility during ADLs: Supervision/safety;Cueing for sequencing;Cueing for safety;Rolling walker        Vision Patient Visual Report: No change from baseline              Pertinent Vitals/Pain Pain Score: 4  Pain Location: R knee Pain Descriptors / Indicators: Aching;Sore Pain Intervention(s): Monitored during session     Hand Dominance     Extremity/Trunk Assessment Upper Extremity Assessment Upper Extremity Assessment: Overall WFL for tasks assessed           Communication Communication Communication: No difficulties   Cognition Arousal/Alertness: Awake/alert Behavior During Therapy: WFL for tasks assessed/performed Overall Cognitive Status: Within Functional Limits for tasks assessed                                Home Living Family/patient expects to be discharged to:: Private residence Living Arrangements: Alone Available Help at Discharge: Family Type of Home: House Home Access: Stairs to enter [employer] of Steps: 1   Home Layout: Two level;Able to live on main level with bedroom/bathroom Alternate Level Stairs-Number of Steps: 1 step down to sunken den   Bathroom Shower/Tub: Teacher, early years/pre: Standard     Home Equipment: Environmental consultant - 2 wheels          Prior Functioning/Environment Level of Independence: Independent                          OT Goals(Current goals can be found in  the care plan section) Acute Rehab OT Goals Patient Stated Goal: home OT Goal Formulation: With patient  OT Frequency:      End of Session Equipment Utilized During Treatment: Rolling walker CPM Right Knee CPM Right Knee: Off Nurse Communication: Mobility status  Activity Tolerance: Patient tolerated treatment well Patient left: with call bell/phone within reach;in chair  OT Visit Diagnosis: Unsteadiness on feet (R26.81);Pain                ADL either performed or assessed with clinical judgement  Time: 1010-1038 OT Time Calculation (min): 28 min Charges:  OT General Charges $OT Visit: 1 Procedure OT  Evaluation $OT Eval Moderate Complexity: 1 Procedure OT Treatments $Self Care/Home Management : 8-22 mins G-Codes:     Kari Baars, [place]  Payton Mccallum D 12/28/2016, 10:42 AM

## 2016-12-28 NOTE — Discharge Instructions (Addendum)
° °Dr. Frank Aluisio °Total Joint Specialist °[facility] °[address] °[place], [place] [address] °[phone] ° °TOTAL KNEE REPLACEMENT POSTOPERATIVE DIRECTIONS ° °Knee Rehabilitation, Guidelines Following Surgery  °Results after knee surgery are often greatly improved when you follow the exercise, range of motion and muscle strengthening exercises prescribed by your doctor. Safety measures are also important to protect the knee from further injury. Any time any of these exercises cause you to have increased pain or swelling in your knee joint, decrease the amount until you are comfortable again and slowly increase them. If you have problems or questions, call your caregiver or physical therapist for advice.  ° °HOME CARE INSTRUCTIONS  °Remove items at home which could result in a fall. This includes throw rugs or furniture in walking pathways.  °· ICE to the affected knee every three hours for 30 minutes at a time and then as needed for pain and swelling.  Continue to use ice on the knee for pain and swelling from surgery. You may notice swelling that will progress down to the foot and ankle.  This is normal after surgery.  Elevate the leg when you are not up walking on it.   °· Continue to use the breathing machine which will help keep your temperature down.  It is common for your temperature to cycle up and down following surgery, especially at night when you are not up moving around and exerting yourself.  The breathing machine keeps your lungs expanded and your temperature down. °· Do not place pillow under knee, focus on keeping the knee straight while resting ° °DIET °You may resume your previous home diet once your are discharged from the hospital. ° °DRESSING / WOUND CARE / SHOWERING °You may shower 3 days after surgery, but keep the wounds dry during showering.  You may use an occlusive plastic wrap (Press'n Seal for example), NO SOAKING/SUBMERGING IN THE BATHTUB.  If the  bandage gets wet, change with a clean dry gauze.  If the incision gets wet, pat the wound dry with a clean towel. °You may start showering once you are discharged home but do not submerge the incision under water. Just pat the incision dry and apply a dry gauze dressing on daily. °Change the surgical dressing daily and reapply a dry dressing each time. ° °ACTIVITY °Walk with your walker as instructed. °Use walker as long as suggested by your caregivers. °Avoid periods of inactivity such as sitting longer than an hour when not asleep. This helps prevent blood clots.  °You may resume a sexual relationship in one month or when given the OK by your doctor.  °You may return to work once you are cleared by your doctor.  °Do not drive a car for 6 weeks or until released by you surgeon.  °Do not drive while taking narcotics. ° °WEIGHT BEARING °Weight bearing as tolerated with assist device (walker, cane, etc) as directed, use it as long as suggested by your surgeon or therapist, typically at least 4-6 weeks. ° °POSTOPERATIVE CONSTIPATION PROTOCOL °Constipation - defined medically as fewer than three stools per week and severe constipation as less than one stool per week. ° °One of the most common issues patients have following surgery is constipation.  Even if you have a regular bowel pattern at home, your normal regimen is likely to be disrupted due to multiple reasons following surgery.  Combination of anesthesia, postoperative narcotics, change in appetite and fluid intake all can affect your bowels.    In order to avoid complications following surgery, here are some recommendations in order to help you during your recovery period. ° °Colace (docusate) - Pick up an over-the-counter form of Colace or another stool softener and take twice a day as long as you are requiring postoperative pain medications.  Take with a full glass of water daily.  If you experience loose stools or diarrhea, hold the colace until you stool forms  back up.  If your symptoms do not get better within 1 week or if they get worse, check with your doctor. ° °Dulcolax (bisacodyl) - Pick up over-the-counter and take as directed by the product packaging as needed to assist with the movement of your bowels.  Take with a full glass of water.  Use this product as needed if not relieved by Colace only.  ° °MiraLax (polyethylene glycol) - Pick up over-the-counter to have on hand.  MiraLax is a solution that will increase the amount of water in your bowels to assist with bowel movements.  Take as directed and can mix with a glass of water, juice, soda, coffee, or tea.  Take if you go more than two days without a movement. °Do not use MiraLax more than once per day. Call your doctor if you are still constipated or irregular after using this medication for 7 days in a row. ° °If you continue to have problems with postoperative constipation, please contact the office for further assistance and recommendations.  If you experience "the worst abdominal pain ever" or develop nausea or vomiting, please contact the office immediatly for further recommendations for treatment. ° °ITCHING ° If you experience itching with your medications, try taking only a single pain pill, or even half a pain pill at a time.  You can also use Benadryl over the counter for itching or also to help with sleep.  ° °TED HOSE STOCKINGS °Wear the elastic stockings on both legs for three weeks following surgery during the day but you may remove then at night for sleeping. ° °MEDICATIONS °See your medication summary on the “After Visit Summary” that the nursing staff will review with you prior to discharge.  You may have some home medications which will be placed on hold until you complete the course of blood thinner medication.  It is important for you to complete the blood thinner medication as prescribed by your surgeon.  Continue your approved medications as instructed at time of  discharge. ° °PRECAUTIONS °If you experience chest pain or shortness of breath - call 911 immediately for transfer to the hospital emergency department.  °If you develop a fever greater that 101 F, purulent drainage from wound, increased redness or drainage from wound, foul odor from the wound/dressing, or calf pain - CONTACT YOUR SURGEON.   °                                                °FOLLOW-UP APPOINTMENTS °Make sure you keep all of your appointments after your operation with your surgeon and caregivers. You should call the office at the above phone number and make an appointment for approximately two weeks after the date of your surgery or on the date instructed by your surgeon outlined in the "After Visit Summary". ° ° °RANGE OF MOTION AND STRENGTHENING EXERCISES  °Rehabilitation of the knee is important following a knee injury or   an operation. After just a few days of immobilization, the muscles of the thigh which control the knee become weakened and shrink (atrophy). Knee exercises are designed to build up the tone and strength of the thigh muscles and to improve knee motion. Often times heat used for twenty to thirty minutes before working out will loosen up your tissues and help with improving the range of motion but do not use heat for the first two weeks following surgery. These exercises can be done on a training (exercise) mat, on the floor, on a table or on a bed. Use what ever works the best and is most comfortable for you Knee exercises include:  °Leg Lifts - While your knee is still immobilized in a splint or cast, you can do straight leg raises. Lift the leg to 60 degrees, hold for 3 sec, and slowly lower the leg. Repeat 10-20 times 2-3 times daily. Perform this exercise against resistance later as your knee gets better.  °Quad and Hamstring Sets - Tighten up the muscle on the front of the thigh (Quad) and hold for 5-10 sec. Repeat this 10-20 times hourly. Hamstring sets are done by pushing the  foot backward against an object and holding for 5-10 sec. Repeat as with quad sets.  °· Leg Slides: Lying on your back, slowly slide your foot toward your buttocks, bending your knee up off the floor (only go as far as is comfortable). Then slowly slide your foot back down until your leg is flat on the floor again. °· Angel Wings: Lying on your back spread your legs to the side as far apart as you can without causing discomfort.  °A rehabilitation program following serious knee injuries can speed recovery and prevent re-injury in the future due to weakened muscles. Contact your doctor or a physical therapist for more information on knee rehabilitation.  ° °IF YOU ARE TRANSFERRED TO A SKILLED REHAB FACILITY °If the patient is transferred to a skilled rehab facility following release from the hospital, a list of the current medications will be sent to the facility for the patient to continue.  When discharged from the skilled rehab facility, please have the facility set up the patient's Home Health Physical Therapy prior to being released. Also, the skilled facility will be responsible for providing the patient with their medications at time of release from the facility to include their pain medication, the muscle relaxants, and their blood thinner medication. If the patient is still at the rehab facility at time of the two week follow up appointment, the skilled rehab facility will also need to assist the patient in arranging follow up appointment in our office and any transportation needs. ° °MAKE SURE YOU:  °Understand these instructions.  °Get help right away if you are not doing well or get worse.  ° ° °Pick up stool softner and laxative for home use following surgery while on pain medications. °Do not submerge incision under water. °Please use good hand washing techniques while changing dressing each day. °May shower starting three days after surgery. °Please use a clean towel to pat the incision dry following  showers. °Continue to use ice for pain and swelling after surgery. °Do not use any lotions or creams on the incision until instructed by your surgeon. ° °Take Xarelto for two and a half more weeks following discharge from the hospital, then discontinue Xarelto. °Once the patient has completed the blood thinner regimen, then take a Baby 81 mg Aspirin daily for three   more weeks. ° ° ° °Information on my medicine - XARELTO® (Rivaroxaban) ° °This medication education was reviewed with me or my healthcare representative as part of my discharge preparation.   ° °Why was Xarelto® prescribed for you? °Xarelto® was prescribed for you to reduce the risk of blood clots forming after orthopedic surgery. The medical term for these abnormal blood clots is venous thromboembolism (VTE). ° °What do you need to know about xarelto® ? °Take your Xarelto® ONCE DAILY at the same time every day. °You may take it either with or without food. ° °If you have difficulty swallowing the tablet whole, you may crush it and mix in applesauce just prior to taking your dose. ° °Take Xarelto® exactly as prescribed by your doctor and DO NOT stop taking Xarelto® without talking to the doctor who prescribed the medication.  Stopping without other VTE prevention medication to take the place of Xarelto® may increase your risk of developing a clot. ° °After discharge, you should have regular check-up appointments with your healthcare provider that is prescribing your Xarelto®.   ° °What do you do if you miss a dose? °If you miss a dose, take it as soon as you remember on the same day then continue your regularly scheduled once daily regimen the next day. Do not take two doses of Xarelto® on the same day.  ° °Important Safety Information °A possible side effect of Xarelto® is bleeding. You should call your healthcare provider right away if you experience any of the following: °? Bleeding from an injury or your nose that does not stop. °? Unusual colored  urine (red or dark brown) or unusual colored stools (red or black). °? Unusual bruising for unknown reasons. °? A serious fall or if you hit your head (even if there is no bleeding). ° °Some medicines may interact with Xarelto® and might increase your risk of bleeding while on Xarelto®. To help avoid this, consult your healthcare provider or pharmacist prior to using any new prescription or non-prescription medications, including herbals, vitamins, non-steroidal anti-inflammatory drugs (NSAIDs) and supplements. ° °This website has more information on Xarelto®: www.xarelto.com. ° ° °

## 2016-12-29 LAB — CBC
HCT: 29 % — ABNORMAL LOW (ref 36.0–46.0)
HEMOGLOBIN: 9.8 g/dL — AB (ref 12.0–15.0)
MCH: 30 pg (ref 26.0–34.0)
MCHC: 33.8 g/dL (ref 30.0–36.0)
MCV: 88.7 fL (ref 78.0–100.0)
PLATELETS: 220 10*3/uL (ref 150–400)
RBC: 3.27 MIL/uL — ABNORMAL LOW (ref 3.87–5.11)
RDW: 14.3 % (ref 11.5–15.5)
WBC: 6.1 10*3/uL (ref 4.0–10.5)

## 2016-12-29 LAB — BASIC METABOLIC PANEL
Anion gap: 4 — ABNORMAL LOW (ref 5–15)
BUN: 11 mg/dL (ref 6–20)
CALCIUM: 8.3 mg/dL — AB (ref 8.9–10.3)
CO2: 29 mmol/L (ref 22–32)
CREATININE: 0.64 mg/dL (ref 0.44–1.00)
Chloride: 105 mmol/L (ref 101–111)
Glucose, Bld: 97 mg/dL (ref 65–99)
Potassium: 3.8 mmol/L (ref 3.5–5.1)
SODIUM: 138 mmol/L (ref 135–145)

## 2016-12-29 NOTE — Progress Notes (Signed)
   Subjective: 2 Days Post-Op Procedure(s) (LRB): RIGHT TOTAL KNEE ARTHROPLASTY (Right) Patient reports pain as mild.   Patient seen in rounds with Dr. Wynelle Link. Patient is well, and has had no acute complaints or problems Patient is ready to go home  Objective: Vital signs in last 24 hours: Temp:  [97.5 F (36.4 C)-99 F (37.2 C)] 98 F (36.7 C) (03/07 0604) Pulse Rate:  [64-75] 65 (03/07 0604) Resp:  [17] 17 (03/07 0604) BP: (130-147)/(64-77) 130/64 (03/07 0604) SpO2:  [96 %-100 %] 100 % (03/07 0604)  Intake/Output from previous day:  Intake/Output Summary (Last 24 hours) at 12/29/16 0812 Last data filed at 12/29/16 0605  Gross per 24 hour  Intake              840 ml  Output             3690 ml  Net            -2850 ml    Intake/Output this shift: No intake/output data recorded.  Labs:  Recent Labs  12/28/16 0524 12/29/16 0433  HGB 11.4* 9.8*    Recent Labs  12/28/16 0524 12/29/16 0433  WBC 10.8* 6.1  RBC 3.76* 3.27*  HCT 34.2* 29.0*  PLT 258 220    Recent Labs  12/28/16 0524 12/29/16 0433  NA 132* 138  K 4.0 3.8  CL 100* 105  CO2 27 29  BUN 9 11  CREATININE 0.60 0.64  GLUCOSE 108* 97  CALCIUM 8.6* 8.3*   No results for input(s): LABPT, INR in the last 72 hours.  EXAM: General - Patient is Alert, Appropriate and Oriented Extremity - Neurovascular intact Sensation intact distally Intact pulses distally Dorsiflexion/Plantar flexion intact Incision - clean, dry Motor Function - intact, moving foot and toes well on exam.   Assessment/Plan: 2 Days Post-Op Procedure(s) (LRB): RIGHT TOTAL KNEE ARTHROPLASTY (Right) Procedure(s) (LRB): RIGHT TOTAL KNEE ARTHROPLASTY (Right) Past Medical History:  Diagnosis Date  . Anxiety   . Arthritis    oa  . Clostridium difficile infection 2013, 2015   Treated with vancomycin  . COPD (chronic obstructive pulmonary disease) ([place])   . Depression   . Diverticula of colon   . Genital herpes   . GERD  (gastroesophageal reflux disease)   . Hypothyroid   . IBS (irritable bowel syndrome)   . Internal hemorrhoids   . Jaundice    as child cause unknown  . Neuropathy ([facility])    fingers both hands  . PONV (postoperative nausea and vomiting)    did well with last neck fusion   . Vocal cord dysfunction    SYASMONIC DYSPHONIA   Principal Problem:   OA (osteoarthritis) of knee  Estimated body mass index is 24.66 kg/m as calculated from the following:   Height as of this encounter: 5' 2.5" (1.588 m).   Weight as of this encounter: 62.1 kg (137 lb). Discharge home with home health Diet - Regular diet Follow up - in 2 weeks Activity - WBAT Disposition - Home Condition Upon Discharge - Good D/C Meds - See DC Summary DVT Prophylaxis - [place], PA-C Orthopaedic Surgery 12/29/2016, 8:12 AM

## 2016-12-29 NOTE — Progress Notes (Signed)
Physical Therapy Treatment Patient Details Name: Mary Hart MRN: [id number] DOB: 03/13/1941 Today's Date: 12/29/2016    History of Present Illness 76 yo female s/p R TKA 12/27/16    PT Comments    Progressing with mobility. Reviewed/practiced exercises, gait training, and stair training. All education completed. Ready to d/c from PT standpoint-made RN aware.    Follow Up Recommendations  Home health PT     Equipment Recommendations  None recommended by PT    Recommendations for Other Services       Precautions / Restrictions Precautions Precautions: Fall;Knee Required Braces or Orthoses: Knee Immobilizer - Right Knee Immobilizer - Right: Discontinue once straight leg raise with < 10 degree lag (able to SLR-KI dc'd) Restrictions Weight Bearing Restrictions: No RLE Weight Bearing: Weight bearing as tolerated    Mobility  Bed Mobility               General bed mobility comments: oob in recliner  Transfers Overall transfer level: Needs assistance Equipment used: Rolling walker (2 wheeled) Transfers: Sit to/from Stand Sit to Stand: Supervision         General transfer comment: VCs safety, hand/LE placement  Ambulation/Gait Ambulation/Gait assistance: Min guard Ambulation Distance (Feet): 200 Feet Assistive device: Rolling walker (2 wheeled) Gait Pattern/deviations: Step-to pattern;Step-through pattern;Decreased stride length     General Gait Details: close guard for safety.    Stairs Stairs: Yes  Min guard assist Stair Management: Step to pattern;Forwards;One rail Right Number of Stairs: 2 General stair comments: close guard for safety. VCS safety, technique, sequence.   Wheelchair Mobility    Modified Rankin (Stroke Patients Only)       Balance                                    Cognition Arousal/Alertness: Awake/alert Behavior During Therapy: WFL for tasks assessed/performed Overall Cognitive Status: Within Functional  Limits for tasks assessed                      Exercises Total Joint Exercises Ankle Circles/Pumps: AROM;Both;10 reps;Supine Quad Sets: AROM;Both;10 reps;Supine Heel Slides: AAROM;Right;Supine;10 reps Hip ABduction/ADduction: AROM;Right;10 reps;Supine Straight Leg Raises: AROM;Right;10 reps;Supine Knee Flexion: AAROM;Right;10 reps;Seated Goniometric ROM: ~10-85 degrees    General Comments        Pertinent Vitals/Pain Pain Assessment: 0-10 Pain Score: 5  Pain Location: R knee Pain Descriptors / Indicators: Aching;Sore Pain Intervention(s): Monitored during session;Repositioned;Ice applied    Home Living                      Prior Function            PT Goals (current goals can now be found in the care plan section) Progress towards PT goals: Progressing toward goals    Frequency    7X/week      PT Plan Current plan remains appropriate    Co-evaluation             End of Session   Activity Tolerance: Patient tolerated treatment well Patient left: in chair;with call bell/phone within reach   PT Visit Diagnosis: Difficulty in walking, not elsewhere classified (R26.2)     Time: 2130-8657 PT Time Calculation (min) (ACUTE ONLY): 24 min  Charges:  $Gait Training: 8-22 mins $Therapeutic Exercise: 8-22 mins  G Codes:       Weston Anna, MPT Pager: [phone]

## 2016-12-30 DIAGNOSIS — K589 Irritable bowel syndrome without diarrhea: Secondary | ICD-10-CM | POA: Diagnosis not present

## 2016-12-30 DIAGNOSIS — Z79891 Long term (current) use of opiate analgesic: Secondary | ICD-10-CM | POA: Diagnosis not present

## 2016-12-30 DIAGNOSIS — G629 Polyneuropathy, unspecified: Secondary | ICD-10-CM | POA: Diagnosis not present

## 2016-12-30 DIAGNOSIS — Z96651 Presence of right artificial knee joint: Secondary | ICD-10-CM | POA: Diagnosis not present

## 2016-12-30 DIAGNOSIS — M1991 Primary osteoarthritis, unspecified site: Secondary | ICD-10-CM | POA: Diagnosis not present

## 2016-12-30 DIAGNOSIS — Z471 Aftercare following joint replacement surgery: Secondary | ICD-10-CM | POA: Diagnosis not present

## 2016-12-30 DIAGNOSIS — K219 Gastro-esophageal reflux disease without esophagitis: Secondary | ICD-10-CM | POA: Diagnosis not present

## 2016-12-30 DIAGNOSIS — F419 Anxiety disorder, unspecified: Secondary | ICD-10-CM | POA: Diagnosis not present

## 2016-12-30 DIAGNOSIS — J449 Chronic obstructive pulmonary disease, unspecified: Secondary | ICD-10-CM | POA: Diagnosis not present

## 2017-01-07 DIAGNOSIS — F419 Anxiety disorder, unspecified: Secondary | ICD-10-CM | POA: Diagnosis not present

## 2017-01-07 DIAGNOSIS — K219 Gastro-esophageal reflux disease without esophagitis: Secondary | ICD-10-CM | POA: Diagnosis not present

## 2017-01-07 DIAGNOSIS — Z471 Aftercare following joint replacement surgery: Secondary | ICD-10-CM | POA: Diagnosis not present

## 2017-01-07 DIAGNOSIS — K589 Irritable bowel syndrome without diarrhea: Secondary | ICD-10-CM | POA: Diagnosis not present

## 2017-01-07 DIAGNOSIS — Z96651 Presence of right artificial knee joint: Secondary | ICD-10-CM | POA: Diagnosis not present

## 2017-01-07 DIAGNOSIS — M1991 Primary osteoarthritis, unspecified site: Secondary | ICD-10-CM | POA: Diagnosis not present

## 2017-01-07 DIAGNOSIS — G629 Polyneuropathy, unspecified: Secondary | ICD-10-CM | POA: Diagnosis not present

## 2017-01-07 DIAGNOSIS — Z79891 Long term (current) use of opiate analgesic: Secondary | ICD-10-CM | POA: Diagnosis not present

## 2017-01-07 DIAGNOSIS — J449 Chronic obstructive pulmonary disease, unspecified: Secondary | ICD-10-CM | POA: Diagnosis not present

## 2017-01-11 DIAGNOSIS — M6281 Muscle weakness (generalized): Secondary | ICD-10-CM | POA: Diagnosis not present

## 2017-01-11 DIAGNOSIS — Z96651 Presence of right artificial knee joint: Secondary | ICD-10-CM | POA: Diagnosis not present

## 2017-01-11 DIAGNOSIS — Z471 Aftercare following joint replacement surgery: Secondary | ICD-10-CM | POA: Diagnosis not present

## 2017-01-11 DIAGNOSIS — M25561 Pain in right knee: Secondary | ICD-10-CM | POA: Diagnosis not present

## 2017-01-13 DIAGNOSIS — Z96651 Presence of right artificial knee joint: Secondary | ICD-10-CM | POA: Diagnosis not present

## 2017-01-13 DIAGNOSIS — M6281 Muscle weakness (generalized): Secondary | ICD-10-CM | POA: Diagnosis not present

## 2017-01-13 DIAGNOSIS — Z471 Aftercare following joint replacement surgery: Secondary | ICD-10-CM | POA: Diagnosis not present

## 2017-01-13 DIAGNOSIS — M25561 Pain in right knee: Secondary | ICD-10-CM | POA: Diagnosis not present

## 2017-01-17 DIAGNOSIS — M25561 Pain in right knee: Secondary | ICD-10-CM | POA: Diagnosis not present

## 2017-01-17 DIAGNOSIS — M6281 Muscle weakness (generalized): Secondary | ICD-10-CM | POA: Diagnosis not present

## 2017-01-17 DIAGNOSIS — Z471 Aftercare following joint replacement surgery: Secondary | ICD-10-CM | POA: Diagnosis not present

## 2017-01-17 DIAGNOSIS — Z96651 Presence of right artificial knee joint: Secondary | ICD-10-CM | POA: Diagnosis not present

## 2017-01-21 ENCOUNTER — Encounter (HOSPITAL_COMMUNITY): Payer: Self-pay | Admitting: Emergency Medicine

## 2017-01-21 ENCOUNTER — Emergency Department (HOSPITAL_COMMUNITY)
Admission: EM | Admit: 2017-01-21 | Discharge: 2017-01-21 | Disposition: A | Discharge status: Home / Self Care | Payer: PPO | Attending: Dermatology | Admitting: Dermatology

## 2017-01-21 DIAGNOSIS — Z5321 Procedure and treatment not carried out due to patient leaving prior to being seen by health care provider: Secondary | ICD-10-CM | POA: Diagnosis not present

## 2017-01-21 DIAGNOSIS — E039 Hypothyroidism, unspecified: Secondary | ICD-10-CM | POA: Insufficient documentation

## 2017-01-21 DIAGNOSIS — R197 Diarrhea, unspecified: Secondary | ICD-10-CM | POA: Insufficient documentation

## 2017-01-21 DIAGNOSIS — Z87891 Personal history of nicotine dependence: Secondary | ICD-10-CM | POA: Insufficient documentation

## 2017-01-21 DIAGNOSIS — J449 Chronic obstructive pulmonary disease, unspecified: Secondary | ICD-10-CM | POA: Diagnosis not present

## 2017-01-21 LAB — URINALYSIS, ROUTINE W REFLEX MICROSCOPIC
Bilirubin Urine: NEGATIVE
Glucose, UA: NEGATIVE mg/dL
Hgb urine dipstick: NEGATIVE
Ketones, ur: NEGATIVE mg/dL
Leukocytes, UA: NEGATIVE
Nitrite: NEGATIVE
Protein, ur: NEGATIVE mg/dL
Specific Gravity, Urine: 1.009 (ref 1.005–1.030)
pH: 6 (ref 5.0–8.0)

## 2017-01-21 LAB — COMPREHENSIVE METABOLIC PANEL
ALBUMIN: 3.6 g/dL (ref 3.5–5.0)
ALK PHOS: 115 U/L (ref 38–126)
ALT: 18 U/L (ref 14–54)
AST: 17 U/L (ref 15–41)
Anion gap: 9 (ref 5–15)
BILIRUBIN TOTAL: 0.6 mg/dL (ref 0.3–1.2)
BUN: 9 mg/dL (ref 6–20)
CALCIUM: 9 mg/dL (ref 8.9–10.3)
CO2: 24 mmol/L (ref 22–32)
CREATININE: 0.68 mg/dL (ref 0.44–1.00)
Chloride: 100 mmol/L — ABNORMAL LOW (ref 101–111)
Glucose, Bld: 100 mg/dL — ABNORMAL HIGH (ref 65–99)
Potassium: 3.8 mmol/L (ref 3.5–5.1)
SODIUM: 133 mmol/L — AB (ref 135–145)
TOTAL PROTEIN: 6.5 g/dL (ref 6.5–8.1)

## 2017-01-21 LAB — CBC WITH DIFFERENTIAL/PLATELET
BASOS ABS: 0 10*3/uL (ref 0.0–0.1)
Basophils Relative: 1 %
Eosinophils Absolute: 0.1 10*3/uL (ref 0.0–0.7)
Eosinophils Relative: 1 %
HEMATOCRIT: 39.2 % (ref 36.0–46.0)
HEMOGLOBIN: 13.1 g/dL (ref 12.0–15.0)
Lymphocytes Relative: 23 %
Lymphs Abs: 1.7 10*3/uL (ref 0.7–4.0)
MCH: 31.6 pg (ref 26.0–34.0)
MCHC: 33.4 g/dL (ref 30.0–36.0)
MCV: 94.7 fL (ref 78.0–100.0)
Monocytes Absolute: 0.7 10*3/uL (ref 0.1–1.0)
Monocytes Relative: 10 %
NEUTROS ABS: 4.7 10*3/uL (ref 1.7–7.7)
NEUTROS PCT: 65 %
Platelets: 517 10*3/uL — ABNORMAL HIGH (ref 150–400)
RBC: 4.14 MIL/uL (ref 3.87–5.11)
RDW: 16.1 % — ABNORMAL HIGH (ref 11.5–15.5)
WBC: 7.2 10*3/uL (ref 4.0–10.5)

## 2017-01-21 NOTE — ED Notes (Signed)
Pt c/o chills and nausea, denies diarrhea today but with hx of C-diff.  Pt believes she is dehydrated.

## 2017-01-21 NOTE — ED Triage Notes (Signed)
Patient complains of abdominal pain. States diarrhea and nausea x 4 days. States history of c-diff in 2015. Denies vomiting.

## 2017-01-24 DIAGNOSIS — M6281 Muscle weakness (generalized): Secondary | ICD-10-CM | POA: Diagnosis not present

## 2017-01-24 DIAGNOSIS — Z471 Aftercare following joint replacement surgery: Secondary | ICD-10-CM | POA: Diagnosis not present

## 2017-01-24 DIAGNOSIS — M25561 Pain in right knee: Secondary | ICD-10-CM | POA: Diagnosis not present

## 2017-01-24 DIAGNOSIS — Z96651 Presence of right artificial knee joint: Secondary | ICD-10-CM | POA: Diagnosis not present

## 2017-01-25 DIAGNOSIS — R197 Diarrhea, unspecified: Secondary | ICD-10-CM | POA: Diagnosis not present

## 2017-01-27 DIAGNOSIS — Z96651 Presence of right artificial knee joint: Secondary | ICD-10-CM | POA: Diagnosis not present

## 2017-01-27 DIAGNOSIS — Z471 Aftercare following joint replacement surgery: Secondary | ICD-10-CM | POA: Diagnosis not present

## 2017-01-27 DIAGNOSIS — M25561 Pain in right knee: Secondary | ICD-10-CM | POA: Diagnosis not present

## 2017-01-27 DIAGNOSIS — M6281 Muscle weakness (generalized): Secondary | ICD-10-CM | POA: Diagnosis not present

## 2017-01-28 DIAGNOSIS — N952 Postmenopausal atrophic vaginitis: Secondary | ICD-10-CM | POA: Diagnosis not present

## 2017-01-28 DIAGNOSIS — K5289 Other specified noninfective gastroenteritis and colitis: Secondary | ICD-10-CM | POA: Diagnosis not present

## 2017-01-28 DIAGNOSIS — Z6824 Body mass index (BMI) 24.0-24.9, adult: Secondary | ICD-10-CM | POA: Diagnosis not present

## 2017-01-31 DIAGNOSIS — M6281 Muscle weakness (generalized): Secondary | ICD-10-CM | POA: Diagnosis not present

## 2017-01-31 DIAGNOSIS — Z96651 Presence of right artificial knee joint: Secondary | ICD-10-CM | POA: Diagnosis not present

## 2017-01-31 DIAGNOSIS — M25561 Pain in right knee: Secondary | ICD-10-CM | POA: Diagnosis not present

## 2017-01-31 DIAGNOSIS — Z471 Aftercare following joint replacement surgery: Secondary | ICD-10-CM | POA: Diagnosis not present

## 2017-02-02 DIAGNOSIS — Z471 Aftercare following joint replacement surgery: Secondary | ICD-10-CM | POA: Diagnosis not present

## 2017-02-02 DIAGNOSIS — Z96651 Presence of right artificial knee joint: Secondary | ICD-10-CM | POA: Diagnosis not present

## 2017-02-02 DIAGNOSIS — M6281 Muscle weakness (generalized): Secondary | ICD-10-CM | POA: Diagnosis not present

## 2017-02-02 DIAGNOSIS — M25561 Pain in right knee: Secondary | ICD-10-CM | POA: Diagnosis not present

## 2017-02-03 DIAGNOSIS — Z96651 Presence of right artificial knee joint: Secondary | ICD-10-CM | POA: Diagnosis not present

## 2017-02-03 DIAGNOSIS — Z471 Aftercare following joint replacement surgery: Secondary | ICD-10-CM | POA: Diagnosis not present

## 2017-02-07 DIAGNOSIS — M25561 Pain in right knee: Secondary | ICD-10-CM | POA: Diagnosis not present

## 2017-02-07 DIAGNOSIS — Z471 Aftercare following joint replacement surgery: Secondary | ICD-10-CM | POA: Diagnosis not present

## 2017-02-07 DIAGNOSIS — Z96651 Presence of right artificial knee joint: Secondary | ICD-10-CM | POA: Diagnosis not present

## 2017-02-07 DIAGNOSIS — M6281 Muscle weakness (generalized): Secondary | ICD-10-CM | POA: Diagnosis not present

## 2017-02-10 DIAGNOSIS — Z96651 Presence of right artificial knee joint: Secondary | ICD-10-CM | POA: Diagnosis not present

## 2017-02-10 DIAGNOSIS — M6281 Muscle weakness (generalized): Secondary | ICD-10-CM | POA: Diagnosis not present

## 2017-02-10 DIAGNOSIS — Z471 Aftercare following joint replacement surgery: Secondary | ICD-10-CM | POA: Diagnosis not present

## 2017-02-10 DIAGNOSIS — M25561 Pain in right knee: Secondary | ICD-10-CM | POA: Diagnosis not present

## 2017-02-14 DIAGNOSIS — Z96651 Presence of right artificial knee joint: Secondary | ICD-10-CM | POA: Diagnosis not present

## 2017-02-14 DIAGNOSIS — M6281 Muscle weakness (generalized): Secondary | ICD-10-CM | POA: Diagnosis not present

## 2017-02-14 DIAGNOSIS — M25561 Pain in right knee: Secondary | ICD-10-CM | POA: Diagnosis not present

## 2017-02-14 DIAGNOSIS — Z471 Aftercare following joint replacement surgery: Secondary | ICD-10-CM | POA: Diagnosis not present

## 2017-02-16 ENCOUNTER — Ambulatory Visit (INDEPENDENT_AMBULATORY_CARE_PROVIDER_SITE_OTHER): Payer: PPO | Admitting: Physician Assistant

## 2017-02-16 ENCOUNTER — Encounter (INDEPENDENT_AMBULATORY_CARE_PROVIDER_SITE_OTHER): Payer: Self-pay

## 2017-02-16 ENCOUNTER — Encounter: Payer: Self-pay | Admitting: Physician Assistant

## 2017-02-16 ENCOUNTER — Other Ambulatory Visit (INDEPENDENT_AMBULATORY_CARE_PROVIDER_SITE_OTHER): Payer: PPO

## 2017-02-16 VITALS — BP 132/76 | HR 84 | Ht 62.25 in | Wt 136.4 lb

## 2017-02-16 DIAGNOSIS — R61 Generalized hyperhidrosis: Secondary | ICD-10-CM

## 2017-02-16 DIAGNOSIS — R194 Change in bowel habit: Secondary | ICD-10-CM | POA: Diagnosis not present

## 2017-02-16 DIAGNOSIS — R109 Unspecified abdominal pain: Secondary | ICD-10-CM

## 2017-02-16 DIAGNOSIS — R195 Other fecal abnormalities: Secondary | ICD-10-CM

## 2017-02-16 DIAGNOSIS — R110 Nausea: Secondary | ICD-10-CM

## 2017-02-16 LAB — URINALYSIS, ROUTINE W REFLEX MICROSCOPIC
Bilirubin Urine: NEGATIVE
Hgb urine dipstick: NEGATIVE
KETONES UR: NEGATIVE
Nitrite: NEGATIVE
RBC / HPF: NONE SEEN (ref 0–?)
Total Protein, Urine: NEGATIVE
Urine Glucose: NEGATIVE
Urobilinogen, UA: 0.2 (ref 0.0–1.0)
pH: 5.5 (ref 5.0–8.0)

## 2017-02-16 LAB — COMPREHENSIVE METABOLIC PANEL
ALK PHOS: 85 U/L (ref 39–117)
ALT: 9 U/L (ref 0–35)
AST: 12 U/L (ref 0–37)
Albumin: 3.7 g/dL (ref 3.5–5.2)
BILIRUBIN TOTAL: 0.2 mg/dL (ref 0.2–1.2)
BUN: 12 mg/dL (ref 6–23)
CO2: 28 mEq/L (ref 19–32)
Calcium: 9 mg/dL (ref 8.4–10.5)
Chloride: 105 mEq/L (ref 96–112)
Creatinine, Ser: 0.85 mg/dL (ref 0.40–1.20)
GFR: 69.5 mL/min (ref >60.00)
GLUCOSE: 85 mg/dL (ref 70–99)
Potassium: 3.9 mEq/L (ref 3.5–5.1)
SODIUM: 138 meq/L (ref 135–145)
TOTAL PROTEIN: 6.5 g/dL (ref 6.0–8.3)

## 2017-02-16 LAB — CBC WITH DIFFERENTIAL/PLATELET
BASOS ABS: 0.1 10*3/uL (ref 0.0–0.1)
Basophils Relative: 0.8 % (ref 0.0–3.0)
Eosinophils Absolute: 0.1 10*3/uL (ref 0.0–0.7)
Eosinophils Relative: 1.6 % (ref 0.0–5.0)
HCT: 42.5 % (ref 36.0–46.0)
Hemoglobin: 13.9 g/dL (ref 12.0–15.0)
LYMPHS ABS: 1.8 10*3/uL (ref 0.7–4.0)
Lymphocytes Relative: 25.4 % (ref 12.0–46.0)
MCHC: 32.7 g/dL (ref 30.0–36.0)
MCV: 95.2 fl (ref 78.0–100.0)
MONO ABS: 0.6 10*3/uL (ref 0.1–1.0)
MONOS PCT: 7.8 % (ref 3.0–12.0)
NEUTROS PCT: 64.4 % (ref 43.0–77.0)
Neutro Abs: 4.6 10*3/uL (ref 1.4–7.7)
Platelets: 362 10*3/uL (ref 150.0–400.0)
RBC: 4.47 Mil/uL (ref 3.87–5.11)
RDW: 15 % (ref 11.5–15.5)
WBC: 7.1 10*3/uL (ref 4.0–10.5)

## 2017-02-16 LAB — SEDIMENTATION RATE: Sed Rate: 24 mm/hr (ref 0–30)

## 2017-02-16 MED ORDER — ONDANSETRON HCL 4 MG PO TABS: 4.0000 mg | ORAL_TABLET | Freq: Three times a day (TID) | ORAL | 1 refills | Status: DC | PRN

## 2017-02-16 NOTE — Patient Instructions (Addendum)
If you are age 74 or older, your body mass index should be between 23-30. Your Body mass index is 24.74 kg/m. If this is out of the aforementioned range listed, please consider follow up with your Primary Care Provider.  If you are age 47 or younger, your body mass index should be between 19-25. Your Body mass index is 24.74 kg/m. If this is out of the aformentioned range listed, please consider follow up with your Primary Care Provider.   Your provider has requested that you go to the basement for lab work before leaving today.  You have been scheduled for a CT scan of the abdomen and pelvis at [facility] ([address]---this is in the same building as [facility]).   You are scheduled on 02-18-2017 at 1pm. You should arrive 15 minutes prior to your appointment time for registration. Please follow the written instructions below on the day of your exam:  WARNING: IF YOU ARE ALLERGIC TO IODINE/X-RAY DYE, PLEASE NOTIFY RADIOLOGY IMMEDIATELY AT [phone]! YOU WILL BE GIVEN A 13 HOUR PREMEDICATION PREP.  1) Do not eat or drink anything after 9am (4 hours prior to your test) 2) You have been given 2 bottles of oral contrast to drink. The solution may taste better if refrigerated, but do NOT add ice or any other liquid to this solution. Shake well before drinking.    Drink 1 bottle of contrast @ 11am (2 hours prior to your exam)  Drink 1 bottle of contrast @ 12/noon (1 hour prior to your exam)  You may take any medications as prescribed with a small amount of water except for the following: Metformin, Glucophage, Glucovance, Avandamet, Riomet, Fortamet, Actoplus Met, Janumet, Glumetza or Metaglip. The above medications must be held the day of the exam AND 48 hours after the exam.  The purpose of you drinking the oral contrast is to aid in the visualization of your intestinal tract. The contrast solution may cause some diarrhea. Before your exam is started, you will be given  a small amount of fluid to drink. Depending on your individual set of symptoms, you may also receive an intravenous injection of x-ray contrast/dye. Plan on being at [facility] for 30 minutes or longer, depending on the type of exam you are having performed.  This test typically takes 30-45 minutes to complete.  If you have any questions regarding your exam or if you need to reschedule, you may call the CT department at [phone] between the hours of 8:00 am and 5:00 pm, Monday-Friday.  Continue Zofran  Increase Miralax to twice a day.   Thank you for choosing [facility]

## 2017-02-16 NOTE — Progress Notes (Addendum)
Subjective:    Patient ID: Mary Hart, female    DOB: [birth year], 76 y.o.   MRN: [id number]  HPI  Mary Hart  is a pleasant 76 year old white female known to Dr. Carlean Purl who was last seen in our office in 2015 after she had had an episode of recurrent C. difficile colitis. Patient has history of GERD, IBS, and osteoarthritis. She recently underwent a right knee arthroplasty in mid March 2018. She was on Xarelto postoperatively which she has now completed. Previous procedures had been done by Dr. Laural Golden with colonoscopy last done in 2013 showing a few sigmoid diverticuli and internal hemorrhoids, no polyps. EGD in 2012 per Dr. Sydell Axon with esophageal dilation. Patient comes in today stating that she has not been feeling well over the past 3 weeks. She says she's been nauseated almost daily but not vomiting. About 2 weeks after her knee surgery she became ill with diarrhea which she said lasted for about 3 days was pretty bad and associated with nausea and bloating. She was worried about the C. difficile. She called her PCP and was given a course of Flagyl which she said she took for about 7 days. The diarrhea went away but she has been very weak since and nauseated. She was eventually seen by her primary care who did stool cultures and blood work which apparently were all unremarkable. She says now over the past couple of weeks she did miserable bloated and unable to have any normal bowel movements. She will take a dose of milk of magnesia which eventually produces several bowel movements which she says have been somewhat flattened. She feels she has having incomplete evacuation. This is then followed by abdominal bloating and discomfort increased nausea. She'll repeat milk of magnesia and then start the whole cycle over again. She has not had any bleeding. No fever or chills though she has been having night sweats frequently over the past couple of weeks. She also relates that over the past several  months she has been having some trouble urinating and has to press on her lower abdomen to initiate voiding.  Review of Systems  Pertinent positive and negative review of systems were noted in the above HPI section.  All other review of systems was otherwise negative.  Outpatient Encounter Prescriptions as of 02/16/2017  Medication Sig  . Alpha-D-Galactosidase (BEANO PO) Take 2 tablets by mouth as needed.  . ALPRAZolam (XANAX) 0.25 MG tablet Take 1 tablet by mouth 3 (three) times daily as needed for anxiety.   . cetirizine (ZYRTEC) 10 MG tablet Take 10 mg by mouth at bedtime.  Marland Kitchen ipratropium (ATROVENT) 0.06 % nasal spray Place 1-2 sprays into the nose daily as needed (for allergies.).   Marland Kitchen levothyroxine (SYNTHROID, LEVOTHROID) 50 MCG tablet Take 50 mcg by mouth daily before breakfast.   . ranitidine (ZANTAC) 150 MG capsule Take 150 mg by mouth at bedtime.   . ondansetron (ZOFRAN) 4 MG tablet Take 1 tablet (4 mg total) by mouth every 8 (eight) hours as needed for nausea or vomiting.  . [DISCONTINUED] gabapentin (NEURONTIN) 300 MG capsule Take 1 capsule (300 mg total) by mouth 3 (three) times daily. Gabapentin 300 mg Protocol Take a 300 mg capsule three times a day for one week, Then a 300 mg capsule twice a day for one week, Then a 300 mg capsule once a day for one week, then discontinue the Gabapentin.  . [DISCONTINUED] HYDROcodone-acetaminophen (NORCO/VICODIN) 5-325 MG tablet Take 1-2 tablets by mouth every 4 (  four) hours as needed (breakthrough pain).  . [DISCONTINUED] methocarbamol (ROBAXIN) 500 MG tablet Take 1 tablet (500 mg total) by mouth every 6 (six) hours as needed for muscle spasms.  . [DISCONTINUED] rivaroxaban (XARELTO) 10 MG TABS tablet Take 1 tablet (10 mg total) by mouth daily with breakfast. Take Xarelto for two and a half more weeks following discharge from the hospital, then discontinue Xarelto. Once the patient has completed the blood thinner regimen, then take a Baby 81 mg  Aspirin daily for three more weeks.  . [DISCONTINUED] Simethicone (GAS-X PO) Take 1 capsule by mouth daily as needed (gas relief).   . [DISCONTINUED] traMADol (ULTRAM) 50 MG tablet Take 1-2 tablets (50-100 mg total) by mouth every 6 (six) hours as needed for moderate pain.  . [DISCONTINUED] UNABLE TO FIND 500 mg 2 (two) times daily. Acyclovir 500mg    No facility-administered encounter medications on file as of 02/16/2017.    Allergies  Allergen Reactions  . Codeine Nausea And Vomiting  . Hydromorphone Hcl Itching  . Morphine Nausea And Vomiting  . Moxifloxacin     N/V/D, skin red. Tolerates Levaquin and Cipro.   . Sulfonamide Derivatives Other (See Comments)    "almost died" "pulse went down to 12"   Patient Active Problem List   Diagnosis Date Noted  . OA (osteoarthritis) of knee 12/27/2016  . Leg weakness 02/12/2014  . Stiffness of joint, not elsewhere classified, lower leg 02/12/2014  . Difficulty in walking(719.7) 02/12/2014  . Recurrent colitis due to Clostridium difficile 07/03/2013  . LLQ pain 10/04/2012  . Irritable bowel syndrome 05/04/2012  . GERD (gastroesophageal reflux disease) 02/14/2012   Social History   Social History  . Marital status: Married    Spouse name: N/A  . Number of children: 4  . Years of education: N/A   Occupational History  . Retired Retired   Social History Main Topics  . Smoking status: Former Smoker    Packs/day: 0.75    Years: 50.00    Types: Cigarettes    Quit date: 12/12/2016  . Smokeless tobacco: Never Used     Comment: 1/2 pack per day  . Alcohol use No  . Drug use: No  . Sexual activity: Not on file   Other Topics Concern  . Not on file   Social History Narrative   Married, 2 sons and 2 daughters   Husband chronically ill and she helps with care   She is retired    Ms. Marietta's family history includes Breast cancer in her mother; Colon cancer in her maternal uncle; Dementia in her father and mother.      Objective:      Vitals:   02/16/17 1326  BP: 132/76  Pulse: 84    Physical Exam  well-developed older white female in no acute distress, accompanied by her daughter, pleasant blood pressure 132/76 pulse 84, height 5 foot 2, weight 136, BMI 24.7. HEENT; nontraumatic normocephalic EOMI PERRLA sclera anicteric, Cardiovascular; regular rate and rhythm with S1-S2 no murmur rub or gallop, Pulmonary ;clear bilaterally, Abdomen; soft, she has some generalized abdominal tenderness nonfocal no palpable mass or hepatosplenomegaly nondistended bowel sounds are active, rectal exam not done, Extremities; no clubbing cyanosis or edema skin warm and dry, Neuropsych ;mood and affect appropriate       Assessment & Plan:   #81 76 year old female with 3 week history of persistent nausea, generalized abdominal discomfort bloating, change in bowel habits and alteration in stool caliber. She has had frequent night  sweats, Symptoms onset about 2 weeks after recent right knee arthroplasty. Etiology of her current constellation of symptoms is not clear-she clearly feels poorly and night sweats are concerning. Acute intra-abdominal inflammatory process, smoldering diverticulitis, diverticular abscess. #2 previous history of recurrent C. difficile colitis 2015 #3 history of IBS #4 GERD with previous esophageal stricture requiring dilation 2012  Plan; Will check CBC with differential, sedimentation rate, CMET, and UA Schedule for CT of the abdomen and pelvis with contrast Continue Zofran 4 mg every 6 hours when necessary for nausea. Have encouraged her to take this more regularly so she can push by mouth fluids and food Increase MiraLAX to 17 g by mouth twice daily Further plans pending results of above.  Amy Genia Harold PA-C 02/16/2017   Cc: Jake Samples, PA* Agree with Ms. Genia Harold assessment and plan. Gatha Mayer, MD, [facility] '

## 2017-02-17 DIAGNOSIS — M6281 Muscle weakness (generalized): Secondary | ICD-10-CM | POA: Diagnosis not present

## 2017-02-17 DIAGNOSIS — Z471 Aftercare following joint replacement surgery: Secondary | ICD-10-CM | POA: Diagnosis not present

## 2017-02-17 DIAGNOSIS — M25561 Pain in right knee: Secondary | ICD-10-CM | POA: Diagnosis not present

## 2017-02-17 DIAGNOSIS — Z96651 Presence of right artificial knee joint: Secondary | ICD-10-CM | POA: Diagnosis not present

## 2017-02-18 ENCOUNTER — Ambulatory Visit (INDEPENDENT_AMBULATORY_CARE_PROVIDER_SITE_OTHER)
Admission: RE | Admit: 2017-02-18 | Discharge: 2017-02-18 | Disposition: A | Discharge status: Home / Self Care | Payer: PPO | Source: Ambulatory Visit | Attending: Gastroenterology | Admitting: Gastroenterology

## 2017-02-18 DIAGNOSIS — R110 Nausea: Secondary | ICD-10-CM

## 2017-02-18 DIAGNOSIS — R109 Unspecified abdominal pain: Secondary | ICD-10-CM | POA: Diagnosis not present

## 2017-02-18 DIAGNOSIS — R61 Generalized hyperhidrosis: Secondary | ICD-10-CM

## 2017-02-18 DIAGNOSIS — R195 Other fecal abnormalities: Secondary | ICD-10-CM

## 2017-02-18 MED ORDER — IOPAMIDOL (ISOVUE-300) INJECTION 61%
100.0000 mL | Freq: Once | INTRAVENOUS | Status: AC | PRN
Start: 2017-02-18 — End: 2017-02-18
  Administered 2017-02-18: 100 mL via INTRAVENOUS

## 2017-02-21 ENCOUNTER — Telehealth: Payer: Self-pay

## 2017-02-21 NOTE — Telephone Encounter (Signed)
Advised of her results. She states she is continuing to feel nauseous. She is taking Zofran as ordered. She is taking Miralax BID. She reports she still has to take MOM to create a bowel movement but of course that cause abdominal pain and diarrhea. Today she feels bloated.

## 2017-02-21 NOTE — Telephone Encounter (Signed)
Let's increase the zantac to 150 mg po BID, and I would suggest a bowel purge to completley clean out bowel - give a Miralax prep or suprep, then restart Miralax  Once daily-please get her an office follow up with Dr Carlean Purl or myself

## 2017-02-21 NOTE — Telephone Encounter (Signed)
Appointment with Dr Carlean Purl 04/14/17 at 11:00

## 2017-02-21 NOTE — Telephone Encounter (Signed)
-----   Message from Alfredia Ferguson, PA-C sent at 02/21/2017 10:39 AM EDT ----- Please let pt know the CT scan looks good -no sign of cancer ,obstruction etc. She has a small right adnexal cyst  Smaller than on prior scan and benign . Mild diverticulosis, no diverticulitis

## 2017-02-22 ENCOUNTER — Other Ambulatory Visit: Payer: Self-pay

## 2017-02-22 ENCOUNTER — Telehealth: Payer: Self-pay | Admitting: Physician Assistant

## 2017-02-22 DIAGNOSIS — M25561 Pain in right knee: Secondary | ICD-10-CM | POA: Diagnosis not present

## 2017-02-22 DIAGNOSIS — M6281 Muscle weakness (generalized): Secondary | ICD-10-CM | POA: Diagnosis not present

## 2017-02-22 DIAGNOSIS — Z96651 Presence of right artificial knee joint: Secondary | ICD-10-CM | POA: Diagnosis not present

## 2017-02-22 DIAGNOSIS — Z471 Aftercare following joint replacement surgery: Secondary | ICD-10-CM | POA: Diagnosis not present

## 2017-02-22 MED ORDER — RANITIDINE HCL 150 MG PO CAPS: 150.0000 mg | ORAL_CAPSULE | Freq: Two times a day (BID) | ORAL | 2 refills | Status: DC

## 2017-02-22 MED ORDER — RANITIDINE HCL 150 MG PO TABS: 150.0000 mg | ORAL_TABLET | Freq: Two times a day (BID) | ORAL | 2 refills | Status: DC

## 2017-02-22 NOTE — Telephone Encounter (Signed)
Spoke with the patient. She is willing to take the Zantac 150 mg BID. She will take daily Miralax. Agrees to the appointment and to call if she doesn't have improvement with these measures. Holding off on the purge.

## 2017-02-22 NOTE — Telephone Encounter (Signed)
Changed to tablets

## 2017-03-15 DIAGNOSIS — Z96651 Presence of right artificial knee joint: Secondary | ICD-10-CM | POA: Diagnosis not present

## 2017-03-15 DIAGNOSIS — R2689 Other abnormalities of gait and mobility: Secondary | ICD-10-CM | POA: Diagnosis not present

## 2017-03-15 DIAGNOSIS — M25661 Stiffness of right knee, not elsewhere classified: Secondary | ICD-10-CM | POA: Diagnosis not present

## 2017-03-15 DIAGNOSIS — Z471 Aftercare following joint replacement surgery: Secondary | ICD-10-CM | POA: Diagnosis not present

## 2017-03-15 DIAGNOSIS — M6281 Muscle weakness (generalized): Secondary | ICD-10-CM | POA: Diagnosis not present

## 2017-03-17 DIAGNOSIS — M25661 Stiffness of right knee, not elsewhere classified: Secondary | ICD-10-CM | POA: Diagnosis not present

## 2017-03-17 DIAGNOSIS — Z96651 Presence of right artificial knee joint: Secondary | ICD-10-CM | POA: Diagnosis not present

## 2017-03-17 DIAGNOSIS — R2689 Other abnormalities of gait and mobility: Secondary | ICD-10-CM | POA: Diagnosis not present

## 2017-03-17 DIAGNOSIS — M6281 Muscle weakness (generalized): Secondary | ICD-10-CM | POA: Diagnosis not present

## 2017-03-17 DIAGNOSIS — Z471 Aftercare following joint replacement surgery: Secondary | ICD-10-CM | POA: Diagnosis not present

## 2017-03-23 DIAGNOSIS — M6281 Muscle weakness (generalized): Secondary | ICD-10-CM | POA: Diagnosis not present

## 2017-03-23 DIAGNOSIS — Z471 Aftercare following joint replacement surgery: Secondary | ICD-10-CM | POA: Diagnosis not present

## 2017-03-23 DIAGNOSIS — R2689 Other abnormalities of gait and mobility: Secondary | ICD-10-CM | POA: Diagnosis not present

## 2017-03-23 DIAGNOSIS — Z96651 Presence of right artificial knee joint: Secondary | ICD-10-CM | POA: Diagnosis not present

## 2017-03-23 DIAGNOSIS — M25661 Stiffness of right knee, not elsewhere classified: Secondary | ICD-10-CM | POA: Diagnosis not present

## 2017-03-25 DIAGNOSIS — Z471 Aftercare following joint replacement surgery: Secondary | ICD-10-CM | POA: Diagnosis not present

## 2017-03-25 DIAGNOSIS — M25661 Stiffness of right knee, not elsewhere classified: Secondary | ICD-10-CM | POA: Diagnosis not present

## 2017-03-25 DIAGNOSIS — R2689 Other abnormalities of gait and mobility: Secondary | ICD-10-CM | POA: Diagnosis not present

## 2017-03-25 DIAGNOSIS — M6281 Muscle weakness (generalized): Secondary | ICD-10-CM | POA: Diagnosis not present

## 2017-03-25 DIAGNOSIS — Z96651 Presence of right artificial knee joint: Secondary | ICD-10-CM | POA: Diagnosis not present

## 2017-03-28 NOTE — Addendum Note (Signed)
Addendum  created 03/28/17 1159 by Myrtie Soman, MD   Sign clinical note

## 2017-03-29 DIAGNOSIS — M25561 Pain in right knee: Secondary | ICD-10-CM | POA: Diagnosis not present

## 2017-03-29 DIAGNOSIS — Z96651 Presence of right artificial knee joint: Secondary | ICD-10-CM | POA: Diagnosis not present

## 2017-03-30 DIAGNOSIS — Z471 Aftercare following joint replacement surgery: Secondary | ICD-10-CM | POA: Diagnosis not present

## 2017-03-30 DIAGNOSIS — M6281 Muscle weakness (generalized): Secondary | ICD-10-CM | POA: Diagnosis not present

## 2017-03-30 DIAGNOSIS — M25661 Stiffness of right knee, not elsewhere classified: Secondary | ICD-10-CM | POA: Diagnosis not present

## 2017-03-30 DIAGNOSIS — Z96651 Presence of right artificial knee joint: Secondary | ICD-10-CM | POA: Diagnosis not present

## 2017-03-30 DIAGNOSIS — R2689 Other abnormalities of gait and mobility: Secondary | ICD-10-CM | POA: Diagnosis not present

## 2017-04-01 DIAGNOSIS — M25661 Stiffness of right knee, not elsewhere classified: Secondary | ICD-10-CM | POA: Diagnosis not present

## 2017-04-01 DIAGNOSIS — R2689 Other abnormalities of gait and mobility: Secondary | ICD-10-CM | POA: Diagnosis not present

## 2017-04-01 DIAGNOSIS — Z471 Aftercare following joint replacement surgery: Secondary | ICD-10-CM | POA: Diagnosis not present

## 2017-04-01 DIAGNOSIS — M6281 Muscle weakness (generalized): Secondary | ICD-10-CM | POA: Diagnosis not present

## 2017-04-01 DIAGNOSIS — Z96651 Presence of right artificial knee joint: Secondary | ICD-10-CM | POA: Diagnosis not present

## 2017-04-05 DIAGNOSIS — M25661 Stiffness of right knee, not elsewhere classified: Secondary | ICD-10-CM | POA: Diagnosis not present

## 2017-04-05 DIAGNOSIS — Z471 Aftercare following joint replacement surgery: Secondary | ICD-10-CM | POA: Diagnosis not present

## 2017-04-05 DIAGNOSIS — M6281 Muscle weakness (generalized): Secondary | ICD-10-CM | POA: Diagnosis not present

## 2017-04-05 DIAGNOSIS — R2689 Other abnormalities of gait and mobility: Secondary | ICD-10-CM | POA: Diagnosis not present

## 2017-04-05 DIAGNOSIS — Z96651 Presence of right artificial knee joint: Secondary | ICD-10-CM | POA: Diagnosis not present

## 2017-04-14 ENCOUNTER — Ambulatory Visit (INDEPENDENT_AMBULATORY_CARE_PROVIDER_SITE_OTHER): Payer: PPO | Admitting: Internal Medicine

## 2017-04-14 ENCOUNTER — Encounter: Payer: Self-pay | Admitting: Internal Medicine

## 2017-04-14 ENCOUNTER — Encounter (INDEPENDENT_AMBULATORY_CARE_PROVIDER_SITE_OTHER): Payer: Self-pay

## 2017-04-14 VITALS — BP 138/70 | HR 66 | Ht 62.5 in | Wt 133.0 lb

## 2017-04-14 DIAGNOSIS — N8189 Other female genital prolapse: Secondary | ICD-10-CM | POA: Insufficient documentation

## 2017-04-14 DIAGNOSIS — R39198 Other difficulties with micturition: Secondary | ICD-10-CM | POA: Diagnosis not present

## 2017-04-14 DIAGNOSIS — K581 Irritable bowel syndrome with constipation: Secondary | ICD-10-CM

## 2017-04-14 MED ORDER — LUBIPROSTONE 8 MCG PO CAPS: 8.0000 ug | ORAL_CAPSULE | Freq: Two times a day (BID) | ORAL | 3 refills | Status: DC

## 2017-04-14 NOTE — Patient Instructions (Signed)
  We have sent the following medications to your pharmacy for you to pick up at your convenience: [place] may add back your miralax if needed.   Please call back in mid July to set up a September appointment.   We are referring you for pelvic floor P.T. , they will contact you with an appointment date/time.    I appreciate the opportunity to care for you. Silvano Rusk, MD, [facility]

## 2017-04-14 NOTE — Progress Notes (Signed)
Mary Hart 76 y.o. [birth year] [id number]  Assessment & Plan:   Encounter Diagnoses  Name Primary?  . Irritable bowel syndrome with constipation Yes  . Pelvic floor weakness in female   . Difficulty in urination    I think pelvic floor weakness might be a big part of her problem and were referred to physical therapy. Start Amitiza 8 g twice a day with food. Side effects of nausea discussed. May add MiraLAX back to that. I should see her again around September. We do not have that scheduled in but she will call back to arrange an appointment. October would be acceptable as well. Call back sooner as needed.  I appreciate the opportunity to care for this patient. CC: Jake Samples, PA-C    Subjective:   Chief Complaint:Bloating constipation sometimes diarrhea  HPI The patient is here for follow-up. She had recently seen any [facility]. A lot of pain in the left side at that time bloating and alternating bowel habits though tended towards constipation. She was told to take her MiraLAX twice a day. That gave her too much diarrhea and alternating once a day and twice a day pattern still caused diarrhea. A CT scan of the abdomen and pelvis was unremarkable i.e. did not reveal specific cause of pain. She continues to have difficulty with multiple stools a day and incomplete defecation. As the week goes on she will bloat and she will take milk of magnesia and flush things out some. She has difficulty initiating a bowel movement and also has difficulty initiating urination and has to bend over and sort of pressed on her suprapubic area. No signs or symptoms of any organ prolapse reported. The left lower quadrant pain is better. She has tried probiotics. She was to use them around the time of her knee surgery but was told by Dr. Ethelene Browns that she should not because it could interfere with Xarelto. Allergies  Allergen Reactions  . Codeine Nausea And Vomiting  . Hydromorphone Hcl  Itching  . Morphine Nausea And Vomiting  . Moxifloxacin     N/V/D, skin red. Tolerates Levaquin and Cipro.   . Sulfonamide Derivatives Other (See Comments)    "almost died" "pulse went down to 12"   Current Meds  Medication Sig  . ALPRAZolam (XANAX) 0.25 MG tablet Take 1 tablet by mouth 3 (three) times daily as needed for anxiety.   . Aspirin-Salicylamide-Caffeine (BC HEADACHE POWDER PO) Take 1 packet by mouth. As needed  . calcium carbonate (OS-CAL) 1250 (500 Ca) MG chewable tablet Chew 2 tablets by mouth daily.  . cetirizine (ZYRTEC) 10 MG tablet Take 10 mg by mouth at bedtime.  Marland Kitchen ipratropium (ATROVENT) 0.06 % nasal spray Place 1-2 sprays into the nose daily as needed (for allergies.).   Marland Kitchen levothyroxine (SYNTHROID, LEVOTHROID) 50 MCG tablet Take 50 mcg by mouth daily before breakfast.   . meloxicam (MOBIC) 15 MG tablet Take 15 mg by mouth daily.  . ondansetron (ZOFRAN) 4 MG tablet Take 1 tablet (4 mg total) by mouth every 8 (eight) hours as needed for nausea or vomiting.  . polyethylene glycol powder (MIRALAX) powder Take 1 Container by mouth daily.  . ranitidine (ZANTAC) 150 MG tablet Take 1 tablet (150 mg total) by mouth 2 (two) times daily.  . Simethicone 180 MG CAPS Take 2 capsules by mouth 2 (two) times daily.   Past Medical History:  Diagnosis Date  . Anxiety   . Arthritis    oa  .  Clostridium difficile infection 2013, 2015   Treated with vancomycin  . COPD (chronic obstructive pulmonary disease) ([place])   . Depression   . Diverticula of colon   . Genital herpes   . GERD (gastroesophageal reflux disease)   . Hypothyroid   . IBS (irritable bowel syndrome)   . Internal hemorrhoids   . Jaundice    as child cause unknown  . Neuropathy    fingers both hands  . PONV (postoperative nausea and vomiting)    did well with last neck fusion   . Recurrent colitis due to Clostridium difficile 07/03/2013   4 treatments with vancomycin since late 2013/early 2014 - responds then  relapses    . Vocal cord dysfunction    SYASMONIC DYSPHONIA   Past Surgical History:  Procedure Laterality Date  . ABDOMINAL HYSTERECTOMY    . Anterior Cervical Fusion X 2    . CHOLECYSTECTOMY    . COLONOSCOPY  06/20/2008   [place] Dr. Wynelle Bourgeois external hemorrhoids  . COLONOSCOPY  10/12/2012   Dr. Gala Romney- internal hemorrhoids, very early, few, sigmoid diverticula o/w normal appearing colon and terminal ileum. + cdiff on stool samples taken. Segmental bx negative.  . ESOPHAGOGASTRODUODENOSCOPY  11/25/10   [facility]- tubular esophagus s/p of 56 dilator from a small HH/otherwise normal  . EYE SURGERY Bilateral 2012   ioc with lens replacement for cataracts  . JOINT REPLACEMENT  09/2010   Thumb left  . meniscus repair right knee  2014  . TOTAL KNEE ARTHROPLASTY Right 12/27/2016   Procedure: RIGHT TOTAL KNEE ARTHROPLASTY;  Surgeon: Gaynelle Arabian, MD;  Location: [facility];  Service: Orthopedics;  Laterality: Right;  Adductor Block  . VESICOVAGINAL FISTULA CLOSURE W/ TAH     Social History   Social History  . Marital status: Married    Spouse name: N/A  . Number of children: 4  . Years of education: N/A   Occupational History  . Retired Retired   Social History Main Topics  . Smoking status: Former Smoker    Packs/day: 0.66    Years: 50.00    Types: Cigarettes  . Smokeless tobacco: Never Used     Comment: 1/2 pack per day  . Alcohol use No  . Drug use: No  . Sexual activity: Not on file   Other Topics Concern  . Not on file   Social History Narrative   Married, 2 sons and 2 daughters   Husband chronically ill and she helps with care   She is retired   family history includes Breast cancer in her mother; Colon cancer in her maternal uncle; Dementia in her father and mother.   Review of Systems As per history of present illness  Objective:   Physical Exam BP 138/70   Pulse 66   Ht 5' 2.5" (1.588 m)   Wt 133 lb (60.3 kg)   BMI 23.94 kg/m  Abd soft NT and no  hernias  Rectal: Lemar Lofty CMA present  NL anoderm - absent anal wink Decreased resting and voluntary tone No rectocele Increased descent with appropriate abdominal contraction   25 minutes time spent with patient > half in counseling coordination of care

## 2017-04-28 ENCOUNTER — Ambulatory Visit: Payer: PPO | Attending: Internal Medicine | Admitting: Physical Therapy

## 2017-04-28 DIAGNOSIS — M6281 Muscle weakness (generalized): Secondary | ICD-10-CM | POA: Insufficient documentation

## 2017-04-28 DIAGNOSIS — M62838 Other muscle spasm: Secondary | ICD-10-CM | POA: Diagnosis not present

## 2017-04-28 DIAGNOSIS — N393 Stress incontinence (female) (male): Secondary | ICD-10-CM | POA: Diagnosis not present

## 2017-04-28 DIAGNOSIS — R278 Other lack of coordination: Secondary | ICD-10-CM | POA: Insufficient documentation

## 2017-04-28 NOTE — Patient Instructions (Signed)
About Abdominal Massage  Abdominal massage, also called external colon massage, is a self-treatment circular massage technique that can reduce and eliminate gas and ease constipation. The colon naturally contracts in waves in a clockwise direction starting from inside the right hip, moving up toward the ribs, across the belly, and down inside the left hip.  When you perform circular abdominal massage, you help stimulate your colon's normal wave pattern of movement called peristalsis.  It is most beneficial when done after eating.  Positioning You can practice abdominal massage with oil while lying down, or in the shower with soap.  Some people find that it is just as effective to do the massage through clothing while sitting or standing.  How to Massage Start by placing your finger tips or knuckles on your right side, just inside your hip bone.  . Make small circular movements while you move upward toward your rib cage.   . Once you reach the bottom right side of your rib cage, take your circular movements across to the left side of the bottom of your rib cage.  . Next, move downward until you reach the inside of your left hip bone.  This is the path your feces travel in your colon. . Continue to perform your abdominal massage in this pattern for 10 minutes each day.     You can apply as much pressure as is comfortable in your massage.  Start gently and build pressure as you continue to practice.  Notice any areas of pain as you massage; areas of slight pain may be relieved as you massage, but if you have areas of significant or intense pain, consult with your healthcare provider.  Other Considerations . General physical activity including bending and stretching can have a beneficial massage-like effect on the colon.  Deep breathing can also stimulate the colon because breathing deeply activates the same nervous system that supplies the colon.   . Abdominal massage should always be used in  combination with a bowel-conscious diet that is high in the proper type of fiber for you, fluids (primarily water), and a regular exercise program.  Types of Fiber  There are two main types of fiber:  insoluble and soluble.  Both of these types can prevent and relieve constipation and diarrhea, although some people find one or the other to be more easily digested.  This handout details information about both types of fiber.  Insoluble Fiber       Functions of Insoluble Fiber . moves bulk through the intestines  . controls and balances the pH (acidity) in the intestines       Benefits of Insoluble Fiber . promotes regular bowel movement and prevents constipation  . removes fecal waste through colon in less time  . keeps an optimal pH in intestines to prevent microbes from producing cancer substances, therefore preventing colon cancer        Food Sources of Insoluble Fiber . whole-wheat products  . wheat bran "miller's bran" . corn bran  . flax seed or other seeds . vegetables such as green beans, broccoli, cauliflower and potato skins  . fruit skins and root vegetable skins  . popcorn . brown rice  Soluble Fiber       Functions of Soluble Fiber  . holds water in the colon to bulk and soften the stool . prolongs stomach emptying time so that sugar is released and absorbed more slowly        Benefits of Soluble Fiber .  lowers total cholesterol and LDL cholesterol (the bad cholesterol) therefore reducing the risk of heart disease  . regulates blood sugar for people with diabetes       Food Sources of Soluble Fiber . oat/oat bran . dried beans and peas  . nuts  . barley  . flax seed or other seeds . fruits such as oranges, pears, peaches, and apples  . vegetables such as carrots  . psyllium husk  . prunes

## 2017-04-28 NOTE — Therapy (Addendum)
[facility] [address], [place], [place], [address] Phone: [phone]   Fax:  [phone]  Physical Therapy Evaluation  Patient Details  Name: Mary Hart MRN: [id number] Date of Birth: 1941/08/23 Referring Provider: Gatha Mayer, MD  Encounter Date: 04/28/2017      PT End of Session - 04/28/17 1636    Visit Number 1   Number of Visits 10   Date for PT Re-Evaluation 06/23/17   PT Start Time 1018   PT Stop Time 1100   PT Time Calculation (min) 42 min   Activity Tolerance Patient tolerated treatment well   Behavior During Therapy [facility] for tasks assessed/performed      Past Medical History:  Diagnosis Date  . Anxiety   . Arthritis    oa  . Clostridium difficile infection 2013, 2015   Treated with vancomycin  . COPD (chronic obstructive pulmonary disease) ([place])   . Depression   . Diverticula of colon   . Genital herpes   . GERD (gastroesophageal reflux disease)   . Hypothyroid   . IBS (irritable bowel syndrome)   . Internal hemorrhoids   . Jaundice    as child cause unknown  . Neuropathy    fingers both hands  . PONV (postoperative nausea and vomiting)    did well with last neck fusion   . Recurrent colitis due to Clostridium difficile 07/03/2013   4 treatments with vancomycin since late 2013/early 2014 - responds then relapses    . Vocal cord dysfunction    SYASMONIC DYSPHONIA    Past Surgical History:  Procedure Laterality Date  . ABDOMINAL HYSTERECTOMY    . Anterior Cervical Fusion X 2    . CHOLECYSTECTOMY    . COLONOSCOPY  06/20/2008   [place] Dr. Wynelle Bourgeois external hemorrhoids  . COLONOSCOPY  10/12/2012   Dr. Gala Romney- internal hemorrhoids, very early, few, sigmoid diverticula o/w normal appearing colon and terminal ileum. + cdiff on stool samples taken. Segmental bx negative.  . ESOPHAGOGASTRODUODENOSCOPY  11/25/10   Rourk- tubular esophagus s/p of 56 dilator from a small HH/otherwise normal   . EYE SURGERY Bilateral 2012   ioc with lens replacement for cataracts  . JOINT REPLACEMENT  09/2010   Thumb left  . meniscus repair right knee  2014  . TOTAL KNEE ARTHROPLASTY Right 12/27/2016   Procedure: RIGHT TOTAL KNEE ARTHROPLASTY;  Surgeon: Gaynelle Arabian, MD;  Location: [facility];  Service: Orthopedics;  Laterality: Right;  Adductor Block  . VESICOVAGINAL FISTULA CLOSURE W/ TAH      There were no vitals filed for this visit.       Subjective Assessment - 04/28/17 1019    Subjective I has knee surgery in March 2018 and have had C-diff 3x in past 4 years.  Since then I have been very weak all over and I have been having a lot of constipation.  I have been taking mirilax.  I saw Dr. Carlean Purl and am now on amitiza and that has helped with the bloating.  I get discomfort on the left upper and lower quadrants.  I feel like my legs are weak and feel bony.  Dr. Carlean Purl said physical therapy would help.   Limitations Other (comment)   Patient Stated Goals get bowels moving better   Currently in Pain? No/denies            [facility] PT Assessment - 04/28/17 0001      Assessment   Medical Diagnosis N81.89 (ICD-10-CM) -  Pelvic floor weakness in female   Referring Provider Gatha Mayer, MD   Prior Therapy No     Precautions   Precautions None     Restrictions   Weight Bearing Restrictions No     Balance Screen   Has the patient fallen in the past 6 months No     [place] residence   Living Arrangements Alone  dog     Prior Function   Level of Independence Independent     Cognition   Overall Cognitive Status Within Functional Limits for tasks assessed     Observation/Other Assessments   Focus on Therapeutic Outcomes (FOTO)  53% limited bowel cnst survey     Posture/Postural Control   Posture/Postural Control Postural limitations   Postural Limitations Rounded Shoulders;Increased thoracic kyphosis;Posterior pelvic tilt     ROM /  Strength   AROM / PROM / Strength PROM;Strength     PROM   Overall PROM Comments WNL bilateral LE     Strength   Overall Strength Comments 4-/5 bilateral LE     Palpation   SI assessment  WNL   Palpation comment obdurator internus left, bilateral hip adductors, fascial restrictions abdomen left upper and lower quadrants     Ambulation/Gait   Gait Pattern Decreased stride length            Objective measurements completed on examination: See above findings.        Pelvic Floor Special Questions - 04/28/17 0001    Prior Pelvic/Prostate Exam Yes   Are you Pregnant or attempting pregnancy? No   Currently Sexually Active No   Urinary Leakage Yes   How often very occasionally with sneezing   Pad use no   Activities that cause leaking Sneezing   Urinary urgency Yes   Urinary frequency yes  at least 1x/hour sometimes more   Fecal incontinence No  mostly constipation, occasional diarrhea   Fluid intake 48 oz mostly tea   Caffeine beverages decaf tea with stevia   Falling out feeling (prolapse) Yes  feels like pressure like I need to pee   Activities that cause feeling of prolapse feels like that all the time   Skin Integrity --  dry   Pelvic Floor Internal Exam pt informed and consent given to perform internal exam   Exam Type Vaginal   Sensation WNL   Palpation obdurator internus, levators, trasver peroneus   Strength fair squeeze, definite lift   Strength # of reps 4   Strength # of seconds 3   Tone slightly elevated                  PT Education - 04/28/17 1637    Education provided Yes   Education Details abdominal massage, types of fiber   Person(s) Educated Patient   Methods Explanation;Handout   Comprehension Verbalized understanding          PT Short Term Goals - 04/28/17 1701      PT SHORT TERM GOAL #1   Title Pt to be I in HEP including urge to void, toileting, and abdominal massage techniques   Time 4   Period Weeks   Status New      PT SHORT TERM GOAL #2   Title LE strength improved to 4/5 for improved functional mobility to help with improved toileting techniques   Time 4   Period Weeks   Status New     PT SHORT TERM GOAL #3  Title Pt able to empty bladder by relaxing pelvic floor muscles and not have to manually press on bladder to empty   Time 4   Period Weeks   Status New           PT Long Term Goals - 04/28/17 1708      PT LONG TERM GOAL #1   Title I with advanced HEP   Time 8   Period Weeks   Status New     PT LONG TERM GOAL #2   Title Reports bowel movements of at least 50% returned to previous regularity   Time 8   Period Weeks   Status New     PT LONG TERM GOAL #3   Title FOTO < or = to 44% limited bowel cnst survey   Time 8   Period Weeks   Status New     PT LONG TERM GOAL #4   Title pt able to relax and bulge pelvic floor muscles in order to have bowel movement without excess bearing down   Time 8   Period Weeks   Status New     PT LONG TERM GOAL #5   Title Pelvic floor strength improved to be able to sustain 10 sec contraction for improved bowel and bladder control   Time 8   Period Weeks   Status New                Plan - 04/28/17 1712    Clinical Impression Statement Patient presents to clinic with history of knee surgery in March and several bouts of C-diff in the past few years.  Since these events she has decreased strength and has had difficulty with regular bowel movements and increased constipation.  Pt has overall weakness 4-/5 in LE and pelvic floor strength 3/76mMT with only able to sustain 3 sec contraction.  Pt is expereincing urinary freuency of at least 1x/hour and having difficulty emptying bladder.  Pt has fascial restriction in abdomen and muscle spasms in LE and pelvic floor.  Pt will benefit from skilled PT to work on improved muscle coordination and to address posture and previously mentioned impairments so she can return to previous activities  without abdominal discomfort.   History and Personal Factors relevant to plan of care: knee surgery, c-diff   Clinical Presentation Stable   Clinical Presentation due to: pt is stable   Clinical Decision Making Moderate   Rehab Potential Good   Clinical Impairments Affecting Rehab Potential history of knee surgery, history of c-diff   PT Frequency 2x / week   PT Duration 8 weeks   PT Treatment/Interventions ADLs/Self Care Home Management;Biofeedback;Cryotherapy;Electrical Stimulation;Moist Heat;Ultrasound;Gait training;Stair training;Functional mobility training;Therapeutic activities;Therapeutic exercise;Neuromuscular re-education;Patient/family education;Manual techniques;Passive range of motion;Dry needling;Taping   PT Next Visit Plan urge to void, internal STM rectally to assess puborectalis muscle, bristol stool scale, abdominal massage, review toilet techniques and fiber   Recommended Other Services n/a   Consulted and Agree with Plan of Care Patient      Patient will benefit from skilled therapeutic intervention in order to improve the following deficits and impairments:  Pain, Increased fascial restricitons, Increased muscle spasms, Decreased strength, Postural dysfunction, Decreased coordination  Visit Diagnosis: Muscle weakness (generalized)  Other muscle spasm  Other lack of coordination  Stress incontinence (female) (female)  gcodes: FOTO and clinical assessment Intake CK Goal CK   Problem List Patient Active Problem List   Diagnosis Date Noted  . Pelvic floor weakness in female 04/14/2017  .  OA (osteoarthritis) of knee 12/27/2016  . Leg weakness 02/12/2014  . Stiffness of joint, not elsewhere classified, lower leg 02/12/2014  . Difficulty in walking(719.7) 02/12/2014  . Irritable bowel syndrome 05/04/2012  . GERD (gastroesophageal reflux disease) 02/14/2012    Zannie Cove, PT 04/28/2017, 5:21 PM  [facility] [address] [place], [place], [address] Phone: [phone]   Fax:  [phone]  Name: Mary Hart MRN: [id number] Date of Birth: May 31, 1941

## 2017-05-03 ENCOUNTER — Ambulatory Visit: Payer: PPO | Admitting: Physical Therapy

## 2017-05-03 ENCOUNTER — Encounter: Payer: Self-pay | Admitting: Physical Therapy

## 2017-05-03 DIAGNOSIS — M25661 Stiffness of right knee, not elsewhere classified: Secondary | ICD-10-CM | POA: Diagnosis not present

## 2017-05-03 DIAGNOSIS — M6281 Muscle weakness (generalized): Secondary | ICD-10-CM | POA: Diagnosis not present

## 2017-05-03 DIAGNOSIS — Z96651 Presence of right artificial knee joint: Secondary | ICD-10-CM | POA: Diagnosis not present

## 2017-05-03 DIAGNOSIS — R2689 Other abnormalities of gait and mobility: Secondary | ICD-10-CM | POA: Diagnosis not present

## 2017-05-03 DIAGNOSIS — M62838 Other muscle spasm: Secondary | ICD-10-CM

## 2017-05-03 DIAGNOSIS — N393 Stress incontinence (female) (male): Secondary | ICD-10-CM

## 2017-05-03 DIAGNOSIS — Z471 Aftercare following joint replacement surgery: Secondary | ICD-10-CM | POA: Diagnosis not present

## 2017-05-03 DIAGNOSIS — R278 Other lack of coordination: Secondary | ICD-10-CM

## 2017-05-03 NOTE — Therapy (Signed)
[facility] [address] [place], [place], [address] Phone: [phone]   Fax:  [phone]  Physical Therapy Treatment  Patient Details  Name: Mary Hart MRN: [id number] Date of Birth: Jul 09, 1941 Referring Provider: Gatha Mayer, MD  Encounter Date: 05/03/2017      PT End of Session - 05/03/17 1017    Visit Number 2   Number of Visits 10   Date for PT Re-Evaluation 06/23/17   PT Start Time 4166   PT Stop Time 1053  pt had to leave early   PT Time Calculation (min) 36 min   Activity Tolerance Patient tolerated treatment well   Behavior During Therapy [facility] for tasks assessed/performed      Past Medical History:  Diagnosis Date  . Anxiety   . Arthritis    oa  . Clostridium difficile infection 2013, 2015   Treated with vancomycin  . COPD (chronic obstructive pulmonary disease) ([place])   . Depression   . Diverticula of colon   . Genital herpes   . GERD (gastroesophageal reflux disease)   . Hypothyroid   . IBS (irritable bowel syndrome)   . Internal hemorrhoids   . Jaundice    as child cause unknown  . Neuropathy    fingers both hands  . PONV (postoperative nausea and vomiting)    did well with last neck fusion   . Recurrent colitis due to Clostridium difficile 07/03/2013   4 treatments with vancomycin since late 2013/early 2014 - responds then relapses    . Vocal cord dysfunction    SYASMONIC DYSPHONIA    Past Surgical History:  Procedure Laterality Date  . ABDOMINAL HYSTERECTOMY    . Anterior Cervical Fusion X 2    . CHOLECYSTECTOMY    . COLONOSCOPY  06/20/2008   [place] Dr. Wynelle Bourgeois external hemorrhoids  . COLONOSCOPY  10/12/2012   Dr. Gala Romney- internal hemorrhoids, very early, few, sigmoid diverticula o/w normal appearing colon and terminal ileum. + cdiff on stool samples taken. Segmental bx negative.  . ESOPHAGOGASTRODUODENOSCOPY  11/25/10   Rourk- tubular esophagus s/p of 56 dilator from a  small HH/otherwise normal  . EYE SURGERY Bilateral 2012   ioc with lens replacement for cataracts  . JOINT REPLACEMENT  09/2010   Thumb left  . meniscus repair right knee  2014  . TOTAL KNEE ARTHROPLASTY Right 12/27/2016   Procedure: RIGHT TOTAL KNEE ARTHROPLASTY;  Surgeon: Gaynelle Arabian, MD;  [facility];  Service: Orthopedics;  Laterality: Right;  Adductor Block  . VESICOVAGINAL FISTULA CLOSURE W/ TAH      There were no vitals filed for this visit.      Subjective Assessment - 05/03/17 1019    Subjective I have been doing the abdominal massage.  Sunday and yesterday I had three bowel movements each day that were a little loose. They were not liquid, but it broke apart very easily, looked fluffy.   Currently in Pain? No/denies                         [facility] Adult PT Treatment/Exercise - 05/03/17 0001      Self-Care   Self-Care Other Self-Care Comments   Other Self-Care Comments  vaginal moisturizing     Exercises   Exercises Knee/Hip     Knee/Hip Exercises: Supine   Bridges with Clamshell Strengthening;Both;20 reps  with red band   Straight Leg Raises Strengthening;Both;5 reps     Knee/Hip Exercises:  Sidelying   Hip ABduction Strengthening;Both;10 reps  need tactile cues for alignment      Manual Therapy   Manual Therapy Soft tissue mobilization;Myofascial release   Manual therapy comments internal STM to posterior pelvic floor, patient informed and consent given to perform   Soft tissue mobilization left obdurator and levator    Myofascial Release abdominal massage and lymph drainage                PT Education - 05/03/17 1053    Education provided Yes   Education Details [place] and [employer] samples and how to moisturize, bridge with band, lateral leg lifts   Person(s) Educated Patient   Methods Explanation;Handout;Verbal cues   Comprehension Verbalized understanding          PT Short Term Goals - 05/03/17 1056       PT SHORT TERM GOAL #1   Title Pt to be I in HEP including urge to void, toileting, and abdominal massage techniques   Baseline has been trying not to go as frequently and doing abdominal massage, has not been bearing down with BM   Time 4   Period Weeks   Status On-going     PT SHORT TERM GOAL #2   Title LE strength improved to 4/5 for improved functional mobility to help with improved toileting techniques   Time 4   Period Weeks   Status On-going     PT SHORT TERM GOAL #3   Title Pt able to empty bladder by relaxing pelvic floor muscles and not have to manually press on bladder to empty   Time 4   Period Weeks   Status On-going           PT Long Term Goals - 04/28/17 1708      PT LONG TERM GOAL #1   Title I with advanced HEP   Time 8   Period Weeks   Status New     PT LONG TERM GOAL #2   Title Reports bowel movements of at least 50% returned to previous regularity   Time 8   Period Weeks   Status New     PT LONG TERM GOAL #3   Title FOTO < or = to 44% limited bowel cnst survey   Time 8   Period Weeks   Status New     PT LONG TERM GOAL #4   Title pt able to relax and bulge pelvic floor muscles in order to have bowel movement without excess bearing down   Time 8   Period Weeks   Status New     PT LONG TERM GOAL #5   Title Pelvic floor strength improved to be able to sustain 10 sec contraction for improved bowel and bladder control   Time 8   Period Weeks   Status New               Plan - 05/03/17 1018    Clinical Impression Statement Patient has had improved bowel movements the last 2 days and has been voiding less frequently due to techniques learned at eval.  Pt has not yet been consistent but is improving,  Assessed pelvic floor rectally and patient has good strength of sphincter muscles but puborectalis unable to contract.  Pt able to relax with pushing out.  Pt demonstrates core weakness that may be impairing ability to have easy bowel movement  and will benefit from skilled PT to improve self care activities.   Rehab  Potential Good   Clinical Impairments Affecting Rehab Potential history of knee surgery, history of c-diff   PT Treatment/Interventions ADLs/Self Care Home Management;Biofeedback;Cryotherapy;Electrical Stimulation;Moist Heat;Ultrasound;Gait training;Stair training;Functional mobility training;Therapeutic activities;Therapeutic exercise;Neuromuscular re-education;Patient/family education;Manual techniques;Passive range of motion;Dry needling;Taping   PT Next Visit Plan urge to void, review toilet techniques, core and LE strengthening   Consulted and Agree with Plan of Care Patient      Patient will benefit from skilled therapeutic intervention in order to improve the following deficits and impairments:  Pain, Increased fascial restricitons, Increased muscle spasms, Decreased strength, Postural dysfunction, Decreased coordination  Visit Diagnosis: Muscle weakness (generalized)  Other muscle spasm  Other lack of coordination  Stress incontinence (female) (female)     Problem List Patient Active Problem List   Diagnosis Date Noted  . Pelvic floor weakness in female 04/14/2017  . OA (osteoarthritis) of knee 12/27/2016  . Leg weakness 02/12/2014  . Stiffness of joint, not elsewhere classified, lower leg 02/12/2014  . Difficulty in walking(719.7) 02/12/2014  . Irritable bowel syndrome 05/04/2012  . GERD (gastroesophageal reflux disease) 02/14/2012    Zannie Cove, PT 05/03/2017, 11:15 AM  [facility] [address] [place], [place], [address] Phone: [phone]   Fax:  [phone]  Name: Mary Hart MRN: [id number] Date of Birth: [birth year]

## 2017-05-03 NOTE — Patient Instructions (Signed)
Bridge    Lie back, legs bent. Inhale, pressing hips up. Keeping ribs in, lengthen lower back. Exhale, back down. Loop band around knees for resistance. Repeat __10__ times. Do __2__ sets every day.  http://pm.exer.us/55   Copyright  VHI. All rights reserved.  Abduction Lift    Lie on residual limb side. Tighten muscles on outside of hip to raise sound limb 6____ inches. Hold __2__ seconds. Repeat __10__ times. Do __1__ sessions per day.  Copyright  VHI. All rights reserved.  Moisturizers . They are used in the vagina to hydrate the mucous membrane that make up the vaginal canal. . Designed to keep a more normal acid balance (ph) . Once placed in the vagina, it will last between two to three days.  . Use 2-3 times per week at bedtime and last longer than 60 min. . Ingredients to avoid is glycerin and fragrance, can increase chance of infection . Should not be used just before sex due to causing irritation . Most are gels administered either in a tampon-shaped applicator or as a vaginal suppository. They are non-hormonal.   Types of Moisturizers . Replens- drug store . Samul Dada- drug store . Vitamin E vaginal suppositories- [employer] foods . Moist Again . Coconut oil- can break down condoms . [employer] . Wright . V-magic - cream external for the vulva area . Yes moisturizer- amazon  Things to avoid in the vaginal area . Do not use things to irritate the vulvar area . No lotions just specialized creams for the vulva area- Neogyn, V-magic, [place] . No soaps; can use Aveeno or Calendula cleanser if needed. Must be gentle . No deodorants . No douches . Good to sleep without underwear to let the vaginal area to air out . No scrubbing: spread the lips to let warm water rinse over labias and pat dry

## 2017-05-05 ENCOUNTER — Ambulatory Visit: Payer: PPO | Admitting: Physical Therapy

## 2017-05-05 DIAGNOSIS — M6281 Muscle weakness (generalized): Secondary | ICD-10-CM

## 2017-05-05 DIAGNOSIS — N393 Stress incontinence (female) (male): Secondary | ICD-10-CM

## 2017-05-05 DIAGNOSIS — R278 Other lack of coordination: Secondary | ICD-10-CM

## 2017-05-05 DIAGNOSIS — M62838 Other muscle spasm: Secondary | ICD-10-CM

## 2017-05-05 NOTE — Therapy (Signed)
[facility] [address], [place], [place], [address] Phone: [phone]   Fax:  [phone]  Physical Therapy Treatment  Patient Details  Name: Mary Hart MRN: [id number] Date of Birth: [birth year] Referring Provider: Gatha Mayer, MD  Encounter Date: 05/05/2017      PT End of Session - 05/05/17 1403    Visit Number 3   Number of Visits 10   Date for PT Re-Evaluation 06/23/17   PT Start Time 7867   PT Stop Time 1443   PT Time Calculation (min) 39 min   Activity Tolerance Patient tolerated treatment well   Behavior During Therapy [facility] for tasks assessed/performed      Past Medical History:  Diagnosis Date  . Anxiety   . Arthritis    oa  . Clostridium difficile infection 2013, 2015   Treated with vancomycin  . COPD (chronic obstructive pulmonary disease) ([place])   . Depression   . Diverticula of colon   . Genital herpes   . GERD (gastroesophageal reflux disease)   . Hypothyroid   . IBS (irritable bowel syndrome)   . Internal hemorrhoids   . Jaundice    as child cause unknown  . Neuropathy    fingers both hands  . PONV (postoperative nausea and vomiting)    did well with last neck fusion   . Recurrent colitis due to Clostridium difficile 07/03/2013   4 treatments with vancomycin since late 2013/early 2014 - responds then relapses    . Vocal cord dysfunction    SYASMONIC DYSPHONIA    Past Surgical History:  Procedure Laterality Date  . ABDOMINAL HYSTERECTOMY    . Anterior Cervical Fusion X 2    . CHOLECYSTECTOMY    . COLONOSCOPY  06/20/2008   [place] Dr. Wynelle Bourgeois external hemorrhoids  . COLONOSCOPY  10/12/2012   Dr. Gala Romney- internal hemorrhoids, very early, few, sigmoid diverticula o/w normal appearing colon and terminal ileum. + cdiff on stool samples taken. Segmental bx negative.  . ESOPHAGOGASTRODUODENOSCOPY  11/25/10   Rourk- tubular esophagus s/p of 56 dilator from a small HH/otherwise normal   . EYE SURGERY Bilateral 2012   ioc with lens replacement for cataracts  . JOINT REPLACEMENT  09/2010   Thumb left  . meniscus repair right knee  2014  . TOTAL KNEE ARTHROPLASTY Right 12/27/2016   Procedure: RIGHT TOTAL KNEE ARTHROPLASTY;  Surgeon: Gaynelle Arabian, MD;  Location: [facility];  Service: Orthopedics;  Laterality: Right;  Adductor Block  . VESICOVAGINAL FISTULA CLOSURE W/ TAH      There were no vitals filed for this visit.      Subjective Assessment - 05/05/17 1407    Subjective I have had a BM every day this week.  It has been a long time since that has happened.  I have not been having to push as much.     Patient Stated Goals get bowels moving better   Currently in Pain? No/denies                         [facility] Adult PT Treatment/Exercise - 05/05/17 0001      Self-Care   Other Self-Care Comments  toilet techniques, urge to void     Neuro Re-ed    Neuro Re-ed Details  sitting on ball rocking and weight shifting and circle, bulging and contracting pelvic floor     Knee/Hip Exercises: Seated   Sit to Sand 10 reps;without UE support  ball squeeze for pelvic floor contraction                PT Education - 05/05/17 1440    Education provided Yes   Education Details toilet techniques, urge to void   Person(s) Educated Patient   Methods Explanation;Handout;Demonstration   Comprehension Verbalized understanding;Returned demonstration          PT Short Term Goals - 05/05/17 1408      PT SHORT TERM GOAL #1   Title Pt to be I in HEP including urge to void, toileting, and abdominal massage techniques   Baseline has been trying not to go as frequently and doing abdominal massage, has not been bearing down with BM   Time 4   Period Weeks   Status On-going     PT SHORT TERM GOAL #2   Title LE strength improved to 4/5 for improved functional mobility to help with improved toileting techniques   Time 4   Period Weeks   Status On-going     PT  SHORT TERM GOAL #3   Title Pt able to empty bladder by relaxing pelvic floor muscles and not have to manually press on bladder to empty   Baseline a little pressure   Time 4   Period Weeks   Status On-going           PT Long Term Goals - 05/05/17 1420      PT LONG TERM GOAL #2   Title Reports bowel movements of at least 50% returned to previous regularity   Time 8   Period Weeks   Status On-going     PT LONG TERM GOAL #3   Title FOTO < or = to 44% limited bowel cnst survey   Time 8   Period Weeks   Status On-going     PT LONG TERM GOAL #4   Title pt able to relax and bulge pelvic floor muscles in order to have bowel movement without excess bearing down   Time 8   Period Weeks   Status On-going     PT LONG TERM GOAL #5   Title Pelvic floor strength improved to be able to sustain 10 sec contraction for improved bowel and bladder control   Time 8   Period Weeks   Status On-going               Plan - 05/05/17 1441    Clinical Impression Statement Patient is making good progress with improved bowel movement.  She continues to have a lot of urinary frequency and urgency.  She is able to contract pelvic floor but engages glutes and hamstrings.  Improves with cues.   Clinical Impairments Affecting Rehab Potential history of knee surgery, history of c-diff   PT Treatment/Interventions ADLs/Self Care Home Management;Biofeedback;Cryotherapy;Electrical Stimulation;Moist Heat;Ultrasound;Gait training;Stair training;Functional mobility training;Therapeutic activities;Therapeutic exercise;Neuromuscular re-education;Patient/family education;Manual techniques;Passive range of motion;Dry needling;Taping   PT Next Visit Plan pelvic floor and core strength in different positions, abdominal massge as needed for improved intestine mobility   Consulted and Agree with Plan of Care Patient      Patient will benefit from skilled therapeutic intervention in order to improve the following  deficits and impairments:  Pain, Increased fascial restricitons, Increased muscle spasms, Decreased strength, Postural dysfunction, Decreased coordination  Visit Diagnosis: Muscle weakness (generalized)  Other muscle spasm  Other lack of coordination  Stress incontinence (female) (female)     Problem List Patient Active Problem List   Diagnosis Date Noted  .  Pelvic floor weakness in female 04/14/2017  . OA (osteoarthritis) of knee 12/27/2016  . Leg weakness 02/12/2014  . Stiffness of joint, not elsewhere classified, lower leg 02/12/2014  . Difficulty in walking(719.7) 02/12/2014  . Irritable bowel syndrome 05/04/2012  . GERD (gastroesophageal reflux disease) 02/14/2012    Zannie Cove, PT 05/05/2017, 2:46 PM  [facility] [address], [place], [place], [address] Phone: [phone]   Fax:  [phone]  Name: Mary Hart MRN: [id number] Date of Birth: 04/17/1941

## 2017-05-05 NOTE — Patient Instructions (Signed)
Toileting Techniques for Bowel Movements (Defecation) Using your belly (abdomen) and pelvic floor muscles to have a bowel movement is usually instinctive.  Sometimes people can have problems with these muscles and have to relearn proper defecation (emptying) techniques.  If you have weakness in your muscles, organs that are falling out, decreased sensation in your pelvis, or ignore your urge to go, you may find yourself straining to have a bowel movement.  You are straining if you are: . holding your breath or taking in a huge gulp of air and holding it  . keeping your lips and jaw tensed and closed tightly . turning red in the face because of excessive pushing or forcing . developing or worsening your  hemorrhoids . getting faint while pushing . not emptying completely and have to defecate many times a day  If you are straining, you are actually making it harder for yourself to have a bowel movement.  Many people find they are pulling up with the pelvic floor muscles and closing off instead of opening the anus. Due to lack pelvic floor relaxation and coordination the abdominal muscles, one has to work harder to push the feces out.  Many people have never been taught how to defecate efficiently and effectively.  Notice what happens to your body when you are having a bowel movement.  While you are sitting on the toilet pay attention to the following areas: . Jaw and mouth position . Angle of your hips   . Whether your feet touch the ground or not . Arm placement  . Spine position . Waist . Belly tension . Anus (opening of the anal canal)  An Evacuation/Defecation Plan   Here are the 4 basic points:  1. Lean forward enough for your elbows to rest on your knees 2. Support your feet on the floor or use a low stool if your feet don't touch the floor  3. Push out your belly as if you have swallowed a beach ball-you should feel a widening of your waist 4. Open and relax your pelvic floor muscles,  rather than tightening around the anus      The following conditions my require modifications to your toileting posture:  . If you have had surgery in the past that limits your back, hip, pelvic, knee or ankle flexibility . Constipation   Your healthcare practitioner may make the following additional suggestions and adjustments:  1) Sit on the toilet  a) Make sure your feet are supported. b) Notice your hip angle and spine position-most people find it effective to lean forward or raise their knees, which can help the muscles around the anus to relax  c) When you lean forward, place your forearms on your thighs for support  2) Relax suggestions a) Breath deeply in through your nose and out slowly through your mouth as if you are smelling the flowers and blowing out the candles. b) To become aware of how to relax your muscles, contracting and releasing muscles can be helpful.  Pull your pelvic floor muscles in tightly by using the image of holding back gas, or closing around the anus (visualize making a circle smaller) and lifting the anus up and in.  Then release the muscles and your anus should drop down and feel open. Repeat 5 times ending with the feeling of relaxation. c) Keep your pelvic floor muscles relaxed; let your belly bulge out. d) The digestive tract starts at the mouth and ends at the anal opening, so be   sure to relax both ends of the tube.  Place your tongue on the roof of your mouth with your teeth separated.  This helps relax your mouth and will help to relax the anus at the same time.  3) Empty (defecation) a) Keep your pelvic floor and sphincter relaxed, then bulge your anal muscles.  Make the anal opening wide.  b) Stick your belly out as if you have swallowed a beach ball. c) Make your belly wall hard using your belly muscles while continuing to breathe. Doing this makes it easier to open your anus. d) Breath out and give a grunt (or try using other sounds such as  ahhhh, shhhhh, ohhhh or grrrrrrr).  4) Finish a) As you finish your bowel movement, pull the pelvic floor muscles up and in.  This will leave your anus in the proper place rather than remaining pushed out and down. If you leave your anus pushed out and down, it will start to feel as though that is normal and give you incorrect signals about needing to have a bowel movement.   [facility] [address] [place], [place] [address] Relaxation Exercises with the Urge to Void   When you experience an urge to void:  FIRST  Stop and stand very still    Sit down if you can    Don't move    You need to stay very still to maintain control  SECOND Squeeze your pelvic floor muscles 5 times, like a quick flick, to keep from leaking  THIRD Relax  Take a deep breath and then let it out  Try to make the urge go away by using relaxation and visualization techniques  FINALLY When you feel the urge go away somewhat, walk normally to the bathroom.   If the urge gets suddenly stronger on the way, you may stop again and relax to regain control.  

## 2017-05-11 ENCOUNTER — Ambulatory Visit: Payer: PPO | Admitting: Physical Therapy

## 2017-05-11 DIAGNOSIS — N393 Stress incontinence (female) (male): Secondary | ICD-10-CM

## 2017-05-11 DIAGNOSIS — R278 Other lack of coordination: Secondary | ICD-10-CM

## 2017-05-11 DIAGNOSIS — M62838 Other muscle spasm: Secondary | ICD-10-CM

## 2017-05-11 DIAGNOSIS — M6281 Muscle weakness (generalized): Secondary | ICD-10-CM | POA: Diagnosis not present

## 2017-05-11 NOTE — Therapy (Signed)
[facility] [address] [place], [place], [address] Phone: [phone]   Fax:  [phone]  Physical Therapy Treatment  Patient Details  Name: Mary Hart MRN: [id number] Date of Birth: September 13, 1941 Referring Provider: Gatha Mayer, MD  Encounter Date: 05/11/2017      PT End of Session - 05/11/17 1134    Visit Number 4   Number of Visits 10   Date for PT Re-Evaluation 06/23/17   PT Start Time 1100   PT Stop Time 1138   PT Time Calculation (min) 38 min   Activity Tolerance Patient tolerated treatment well   Behavior During Therapy [facility] for tasks assessed/performed      Past Medical History:  Diagnosis Date  . Anxiety   . Arthritis    oa  . Clostridium difficile infection 2013, 2015   Treated with vancomycin  . COPD (chronic obstructive pulmonary disease) ([place])   . Depression   . Diverticula of colon   . Genital herpes   . GERD (gastroesophageal reflux disease)   . Hypothyroid   . IBS (irritable bowel syndrome)   . Internal hemorrhoids   . Jaundice    as child cause unknown  . Neuropathy    fingers both hands  . PONV (postoperative nausea and vomiting)    did well with last neck fusion   . Recurrent colitis due to Clostridium difficile 07/03/2013   4 treatments with vancomycin since late 2013/early 2014 - responds then relapses    . Vocal cord dysfunction    SYASMONIC DYSPHONIA    Past Surgical History:  Procedure Laterality Date  . ABDOMINAL HYSTERECTOMY    . Anterior Cervical Fusion X 2    . CHOLECYSTECTOMY    . COLONOSCOPY  06/20/2008   [place] Dr. Wynelle Bourgeois external hemorrhoids  . COLONOSCOPY  10/12/2012   Dr. Gala Romney- internal hemorrhoids, very early, few, sigmoid diverticula o/w normal appearing colon and terminal ileum. + cdiff on stool samples taken. Segmental bx negative.  . ESOPHAGOGASTRODUODENOSCOPY  11/25/10   Rourk- tubular esophagus s/p of 56 dilator from a small HH/otherwise normal   . EYE SURGERY Bilateral 2012   ioc with lens replacement for cataracts  . JOINT REPLACEMENT  09/2010   Thumb left  . meniscus repair right knee  2014  . TOTAL KNEE ARTHROPLASTY Right 12/27/2016   Procedure: RIGHT TOTAL KNEE ARTHROPLASTY;  Surgeon: Gaynelle Arabian, MD;  Location: [facility];  Service: Orthopedics;  Laterality: Right;  Adductor Block  . VESICOVAGINAL FISTULA CLOSURE W/ TAH      There were no vitals filed for this visit.      Subjective Assessment - 05/11/17 1109    Subjective The squeezing 5x helps control the bladder. The stool and the not holding breath is helping.    Patient Stated Goals get bowels moving better   Currently in Pain? No/denies                         [facility] Adult PT Treatment/Exercise - 05/11/17 0001      Neuro Re-ed    Neuro Re-ed Details  pelvic floor contract with all exercises     Knee/Hip Exercises: Aerobic   Nustep 6 min L1     Knee/Hip Exercises: Supine   Other Supine Knee/Hip Exercises ball squeeze, bridges, sit to stand with PF squeeze, PF contract relax x 5 sec     Knee/Hip Exercises: Sidelying   Hip ABduction Strengthening;Both;10 reps  need tactile cues for alignment                 PT Education - 05/11/17 1423    Education provided Yes   Education Details sit to stand with PF contraction   Person(s) Educated Patient   Methods Explanation;Handout;Verbal cues;Demonstration   Comprehension Verbalized understanding;Returned demonstration          PT Short Term Goals - 05/11/17 1113      PT SHORT TERM GOAL #1   Title Pt to be I in HEP including urge to void, toileting, and abdominal massage techniques   Time 4   Period Weeks   Status Achieved     PT SHORT TERM GOAL #2   Title LE strength improved to 4/5 for improved functional mobility to help with improved toileting techniques   Time 4   Period Weeks   Status Achieved     PT SHORT TERM GOAL #3   Title Pt able to empty bladder by relaxing pelvic  floor muscles and not have to manually press on bladder to empty   Baseline occasionally but usually not   Time 4   Period Weeks   Status Achieved           PT Long Term Goals - 05/11/17 1107      PT LONG TERM GOAL #1   Title I with advanced HEP   Time 8   Period Weeks   Status On-going     PT LONG TERM GOAL #2   Title Reports bowel movements of at least 50% returned to previous regularity   Baseline 75-80% more regular   Time 8   Period Weeks   Status Achieved     PT LONG TERM GOAL #3   Title FOTO < or = to 44% limited bowel cnst survey   Time 8   Period Weeks   Status On-going     PT LONG TERM GOAL #4   Title pt able to relax and bulge pelvic floor muscles in order to have bowel movement without excess bearing down   Time 8   Period Weeks   Status On-going     PT LONG TERM GOAL #5   Title Pelvic floor strength improved to be able to sustain 10 sec contraction for improved bowel and bladder control   Time 8   Period Weeks   Status On-going               Plan - 05/11/17 1118    Clinical Impression Statement Patient has met goals for improved bowel movement and hip strength.  She is having less urgency.  Pt able to sustain pelvic floor contraction for 5 seconds.  She continues to need cues for contracting pelvic floor. Needs to review correct pelvic floor contractions with current HEP and functional movments.   Clinical Impairments Affecting Rehab Potential history of knee surgery, history of c-diff   PT Treatment/Interventions ADLs/Self Care Home Management;Biofeedback;Cryotherapy;Electrical Stimulation;Moist Heat;Ultrasound;Gait training;Stair training;Functional mobility training;Therapeutic activities;Therapeutic exercise;Neuromuscular re-education;Patient/family education;Manual techniques;Passive range of motion;Dry needling;Taping   PT Next Visit Plan pelvic floor and breathing with increased pelvic floor endurance, review HEP possible discharge next 1-2  visits   Consulted and Agree with Plan of Care Patient      Patient will benefit from skilled therapeutic intervention in order to improve the following deficits and impairments:  Pain, Increased fascial restricitons, Increased muscle spasms, Decreased strength, Postural dysfunction, Decreased coordination  Visit Diagnosis: Muscle weakness (generalized)  Other muscle  spasm  Other lack of coordination  Stress incontinence (female) (female)     Problem List Patient Active Problem List   Diagnosis Date Noted  . Pelvic floor weakness in female 04/14/2017  . OA (osteoarthritis) of knee 12/27/2016  . Leg weakness 02/12/2014  . Stiffness of joint, not elsewhere classified, lower leg 02/12/2014  . Difficulty in walking(719.7) 02/12/2014  . Irritable bowel syndrome 05/04/2012  . GERD (gastroesophageal reflux disease) 02/14/2012    Zannie Cove, PT 05/11/2017, 2:26 PM  [facility] [address] [place], [place], [address] Phone: [phone]   Fax:  [phone]  Name: SHERRIAN NUNNELLEY MRN: [id number] Date of Birth: June 15, 1941

## 2017-05-11 NOTE — Patient Instructions (Signed)
   Sit to stand: Contract pelvic floor before standing, then contract again before sitting Make sure your not bringing your knees together 2 x 15

## 2017-05-13 ENCOUNTER — Ambulatory Visit: Payer: PPO | Admitting: Physical Therapy

## 2017-05-17 ENCOUNTER — Ambulatory Visit: Payer: PPO | Admitting: Physical Therapy

## 2017-05-17 DIAGNOSIS — M6281 Muscle weakness (generalized): Secondary | ICD-10-CM

## 2017-05-17 DIAGNOSIS — M62838 Other muscle spasm: Secondary | ICD-10-CM

## 2017-05-17 DIAGNOSIS — N393 Stress incontinence (female) (male): Secondary | ICD-10-CM

## 2017-05-17 DIAGNOSIS — R278 Other lack of coordination: Secondary | ICD-10-CM

## 2017-05-17 NOTE — Therapy (Signed)
[facility] [address] [place], [place], [address] Phone: [phone]   Fax:  [phone]  Physical Therapy Treatment  Patient Details  Name: Mary Hart MRN: [id number] Date of Birth: 23-Dec-1940 Referring Provider: Gatha Mayer, MD  Encounter Date: 05/17/2017      PT End of Session - 05/17/17 1409    Visit Number 5   Number of Visits 10   Date for PT Re-Evaluation 06/23/17   PT Start Time 0165   PT Stop Time 1444   PT Time Calculation (min) 40 min   Activity Tolerance Patient tolerated treatment well   Behavior During Therapy [facility] for tasks assessed/performed      Past Medical History:  Diagnosis Date  . Anxiety   . Arthritis    oa  . Clostridium difficile infection 2013, 2015   Treated with vancomycin  . COPD (chronic obstructive pulmonary disease) ([place])   . Depression   . Diverticula of colon   . Genital herpes   . GERD (gastroesophageal reflux disease)   . Hypothyroid   . IBS (irritable bowel syndrome)   . Internal hemorrhoids   . Jaundice    as child cause unknown  . Neuropathy    fingers both hands  . PONV (postoperative nausea and vomiting)    did well with last neck fusion   . Recurrent colitis due to Clostridium difficile 07/03/2013   4 treatments with vancomycin since late 2013/early 2014 - responds then relapses    . Vocal cord dysfunction    SYASMONIC DYSPHONIA    Past Surgical History:  Procedure Laterality Date  . ABDOMINAL HYSTERECTOMY    . Anterior Cervical Fusion X 2    . CHOLECYSTECTOMY    . COLONOSCOPY  06/20/2008   [place] Dr. Wynelle Bourgeois external hemorrhoids  . COLONOSCOPY  10/12/2012   Dr. Gala Romney- internal hemorrhoids, very early, few, sigmoid diverticula o/w normal appearing colon and terminal ileum. + cdiff on stool samples taken. Segmental bx negative.  . ESOPHAGOGASTRODUODENOSCOPY  11/25/10   Rourk- tubular esophagus s/p of 56 dilator from a small HH/otherwise normal   . EYE SURGERY Bilateral 2012   ioc with lens replacement for cataracts  . JOINT REPLACEMENT  09/2010   Thumb left  . meniscus repair right knee  2014  . TOTAL KNEE ARTHROPLASTY Right 12/27/2016   Procedure: RIGHT TOTAL KNEE ARTHROPLASTY;  Surgeon: Gaynelle Arabian, MD;  Location: [facility];  Service: Orthopedics;  Laterality: Right;  Adductor Block  . VESICOVAGINAL FISTULA CLOSURE W/ TAH      There were no vitals filed for this visit.      Subjective Assessment - 05/17/17 1442    Subjective Patient is doing very well with HEP.  She has met almost all of her goals.  Pt is able to continue progressing the pelvic floor endurance on her own at this time.  Pt states she has not been having any nocturia and able to sleep 9 hours.  Pt states she has had regular bowel movments   Patient Stated Goals get bowels moving better   Currently in Pain? No/denies   Multiple Pain Sites No                         [facility] Adult PT Treatment/Exercise - 05/17/17 0001      Knee/Hip Exercises: Aerobic   Nustep 6 min L1     Knee/Hip Exercises: Supine   Bridges with Clamshell Strengthening;Both;20 reps  with green band   Straight Leg Raises Strengthening;Both;5 reps   Other Supine Knee/Hip Exercises ball squeeze, bridges, sit to stand with PF squeeze, PF contract relax x 5 sec     Knee/Hip Exercises: Sidelying   Hip ABduction Strengthening;Both;10 reps  need tactile cues for alignment    Hip ADduction Strengthening;Right;Left;10 reps                PT Education - 05/17/17 1445    Education provided Yes   Education Details pelvic floor educator and hip abduction   Person(s) Educated Patient   Methods Explanation;Demonstration;Verbal cues;Handout   Comprehension Verbalized understanding;Returned demonstration          PT Short Term Goals - 05/11/17 1113      PT SHORT TERM GOAL #1   Title Pt to be I in HEP including urge to void, toileting, and abdominal massage techniques    Time 4   Period Weeks   Status Achieved     PT SHORT TERM GOAL #2   Title LE strength improved to 4/5 for improved functional mobility to help with improved toileting techniques   Time 4   Period Weeks   Status Achieved     PT SHORT TERM GOAL #3   Title Pt able to empty bladder by relaxing pelvic floor muscles and not have to manually press on bladder to empty   Baseline occasionally but usually not   Time 4   Period Weeks   Status Achieved           PT Long Term Goals - 05/17/17 1412      PT LONG TERM GOAL #1   Title I with advanced HEP   Time 8   Period Weeks   Status Achieved     PT LONG TERM GOAL #2   Title Reports bowel movements of at least 50% returned to previous regularity   Baseline 75-80% more regular   Time 8   Period Weeks   Status Achieved     PT LONG TERM GOAL #3   Title FOTO < or = to 44% limited bowel cnst survey   Baseline 32% today   Time 8   Period Weeks   Status Achieved     PT LONG TERM GOAL #4   Title pt able to relax and bulge pelvic floor muscles in order to have bowel movement without excess bearing down   Baseline just a little pressure but don't have to strain   Time 8   Period Weeks   Status Achieved     PT LONG TERM GOAL #5   Title Pelvic floor strength improved to be able to sustain 10 sec contraction for improved bowel and bladder control   Baseline less frequency, leakage, and no more nocturia with bladder; no bowel leakage; holding for 8 sec   Time 8   Period Weeks   Status Partially Met               Plan - 05/17/17 1411    Clinical Impression Statement Patient has made significant progress and is comfortable to continue to work on any more leakage issues on her own with HEP.  Pt will be discharged today.   Clinical Impairments Affecting Rehab Potential history of knee surgery, history of c-diff   PT Treatment/Interventions ADLs/Self Care Home Management;Biofeedback;Cryotherapy;Electrical Stimulation;Moist  Heat;Ultrasound;Gait training;Stair training;Functional mobility training;Therapeutic activities;Therapeutic exercise;Neuromuscular re-education;Patient/family education;Manual techniques;Passive range of motion;Dry needling;Taping   PT Next Visit Plan discharged today   Consulted  and Agree with Plan of Care Patient      Patient will benefit from skilled therapeutic intervention in order to improve the following deficits and impairments:  Pain, Increased fascial restricitons, Increased muscle spasms, Decreased strength, Postural dysfunction, Decreased coordination  Visit Diagnosis: Muscle weakness (generalized)  Other muscle spasm  Other lack of coordination  Stress incontinence (female) (female)     Problem List Patient Active Problem List   Diagnosis Date Noted  . Pelvic floor weakness in female 04/14/2017  . OA (osteoarthritis) of knee 12/27/2016  . Leg weakness 02/12/2014  . Stiffness of joint, not elsewhere classified, lower leg 02/12/2014  . Difficulty in walking(719.7) 02/12/2014  . Irritable bowel syndrome 05/04/2012  . GERD (gastroesophageal reflux disease) 02/14/2012    Zannie Cove, PT 05/17/2017, 2:58 PM  [facility] [address] [place], [place], [address] Phone: [phone]   Fax:  [phone]  Name: LETASHA KERSHAW MRN: [id number] Date of Birth: [birth year]  PHYSICAL THERAPY DISCHARGE SUMMARY  Visits from Start of Care: 5  Current functional level related to goals / functional outcomes: See above goals   Remaining deficits: See above   Education / Equipment: HEP  Plan: Patient agrees to discharge.  Patient goals were met. Patient is being discharged due to meeting the stated rehab goals.  ?????         Google, PT 05/17/17 3:14 PM

## 2017-05-17 NOTE — Patient Instructions (Addendum)
    HIP ADDUCTION - SIDELYING  While lying on your side, slowly raise up your bottom leg towards the ceiling. Keep your knee straight the entire time.   Your top leg should be bent at the knee and your foot planted on the ground supporting your body.  Repeat 5x do 2 sets, build up to 2 sets of 10 reps

## 2017-05-20 ENCOUNTER — Encounter: Payer: PPO | Admitting: Physical Therapy

## 2017-05-23 ENCOUNTER — Encounter: Payer: PPO | Admitting: Physical Therapy

## 2017-05-25 ENCOUNTER — Encounter: Payer: PPO | Admitting: Physical Therapy

## 2017-05-31 ENCOUNTER — Encounter: Payer: PPO | Admitting: Physical Therapy

## 2017-06-02 ENCOUNTER — Encounter: Payer: PPO | Admitting: Physical Therapy

## 2017-06-06 DIAGNOSIS — F419 Anxiety disorder, unspecified: Secondary | ICD-10-CM | POA: Diagnosis not present

## 2017-06-06 DIAGNOSIS — E039 Hypothyroidism, unspecified: Secondary | ICD-10-CM | POA: Diagnosis not present

## 2017-06-06 DIAGNOSIS — Z6824 Body mass index (BMI) 24.0-24.9, adult: Secondary | ICD-10-CM | POA: Diagnosis not present

## 2017-06-06 DIAGNOSIS — Z0001 Encounter for general adult medical examination with abnormal findings: Secondary | ICD-10-CM | POA: Diagnosis not present

## 2017-06-06 DIAGNOSIS — M1991 Primary osteoarthritis, unspecified site: Secondary | ICD-10-CM | POA: Diagnosis not present

## 2017-06-06 DIAGNOSIS — Z1389 Encounter for screening for other disorder: Secondary | ICD-10-CM | POA: Diagnosis not present

## 2017-06-06 DIAGNOSIS — R7309 Other abnormal glucose: Secondary | ICD-10-CM | POA: Diagnosis not present

## 2017-06-06 DIAGNOSIS — E538 Deficiency of other specified B group vitamins: Secondary | ICD-10-CM | POA: Diagnosis not present

## 2017-06-06 DIAGNOSIS — E559 Vitamin D deficiency, unspecified: Secondary | ICD-10-CM | POA: Diagnosis not present

## 2017-06-06 DIAGNOSIS — J449 Chronic obstructive pulmonary disease, unspecified: Secondary | ICD-10-CM | POA: Diagnosis not present

## 2017-06-07 ENCOUNTER — Encounter: Payer: PPO | Admitting: Physical Therapy

## 2017-06-09 ENCOUNTER — Encounter: Payer: PPO | Admitting: Physical Therapy

## 2017-06-14 ENCOUNTER — Encounter: Payer: PPO | Admitting: Physical Therapy

## 2017-06-14 DIAGNOSIS — M2550 Pain in unspecified joint: Secondary | ICD-10-CM | POA: Diagnosis not present

## 2017-06-14 DIAGNOSIS — Z6823 Body mass index (BMI) 23.0-23.9, adult: Secondary | ICD-10-CM | POA: Diagnosis not present

## 2017-06-14 DIAGNOSIS — M150 Primary generalized (osteo)arthritis: Secondary | ICD-10-CM | POA: Diagnosis not present

## 2017-06-14 DIAGNOSIS — M3500 Sicca syndrome, unspecified: Secondary | ICD-10-CM | POA: Diagnosis not present

## 2017-06-14 DIAGNOSIS — Z79899 Other long term (current) drug therapy: Secondary | ICD-10-CM | POA: Diagnosis not present

## 2017-06-14 DIAGNOSIS — M1990 Unspecified osteoarthritis, unspecified site: Secondary | ICD-10-CM | POA: Diagnosis not present

## 2017-06-16 ENCOUNTER — Encounter: Payer: PPO | Admitting: Physical Therapy

## 2017-06-21 ENCOUNTER — Encounter: Payer: PPO | Admitting: Physical Therapy

## 2017-06-23 ENCOUNTER — Encounter: Payer: PPO | Admitting: Physical Therapy

## 2017-06-28 ENCOUNTER — Ambulatory Visit: Payer: PPO | Admitting: Internal Medicine

## 2017-07-04 ENCOUNTER — Other Ambulatory Visit: Payer: Self-pay | Admitting: Gastroenterology

## 2017-07-07 DIAGNOSIS — Z471 Aftercare following joint replacement surgery: Secondary | ICD-10-CM | POA: Diagnosis not present

## 2017-07-07 DIAGNOSIS — M1711 Unilateral primary osteoarthritis, right knee: Secondary | ICD-10-CM | POA: Diagnosis not present

## 2017-07-07 DIAGNOSIS — Z96651 Presence of right artificial knee joint: Secondary | ICD-10-CM | POA: Diagnosis not present

## 2017-08-02 DIAGNOSIS — H04201 Unspecified epiphora, right lacrimal gland: Secondary | ICD-10-CM | POA: Diagnosis not present

## 2017-08-02 DIAGNOSIS — H524 Presbyopia: Secondary | ICD-10-CM | POA: Diagnosis not present

## 2017-08-02 DIAGNOSIS — H353131 Nonexudative age-related macular degeneration, bilateral, early dry stage: Secondary | ICD-10-CM | POA: Diagnosis not present

## 2017-08-02 DIAGNOSIS — Z961 Presence of intraocular lens: Secondary | ICD-10-CM | POA: Diagnosis not present

## 2017-08-15 ENCOUNTER — Other Ambulatory Visit: Payer: Self-pay | Admitting: Internal Medicine

## 2017-08-17 DIAGNOSIS — E538 Deficiency of other specified B group vitamins: Secondary | ICD-10-CM | POA: Diagnosis not present

## 2017-08-17 DIAGNOSIS — Z23 Encounter for immunization: Secondary | ICD-10-CM | POA: Diagnosis not present

## 2017-10-12 DIAGNOSIS — D510 Vitamin B12 deficiency anemia due to intrinsic factor deficiency: Secondary | ICD-10-CM | POA: Diagnosis not present

## 2017-11-21 DIAGNOSIS — Z124 Encounter for screening for malignant neoplasm of cervix: Secondary | ICD-10-CM | POA: Diagnosis not present

## 2017-11-21 DIAGNOSIS — Z1231 Encounter for screening mammogram for malignant neoplasm of breast: Secondary | ICD-10-CM | POA: Diagnosis not present

## 2017-11-21 DIAGNOSIS — M818 Other osteoporosis without current pathological fracture: Secondary | ICD-10-CM | POA: Diagnosis not present

## 2017-11-21 DIAGNOSIS — Z6827 Body mass index (BMI) 27.0-27.9, adult: Secondary | ICD-10-CM | POA: Diagnosis not present

## 2017-12-15 DIAGNOSIS — M150 Primary generalized (osteo)arthritis: Secondary | ICD-10-CM | POA: Diagnosis not present

## 2017-12-15 DIAGNOSIS — E663 Overweight: Secondary | ICD-10-CM | POA: Diagnosis not present

## 2017-12-15 DIAGNOSIS — M2550 Pain in unspecified joint: Secondary | ICD-10-CM | POA: Diagnosis not present

## 2017-12-15 DIAGNOSIS — Z6826 Body mass index (BMI) 26.0-26.9, adult: Secondary | ICD-10-CM | POA: Diagnosis not present

## 2017-12-15 DIAGNOSIS — M1990 Unspecified osteoarthritis, unspecified site: Secondary | ICD-10-CM | POA: Diagnosis not present

## 2017-12-15 DIAGNOSIS — M3500 Sicca syndrome, unspecified: Secondary | ICD-10-CM | POA: Diagnosis not present

## 2017-12-20 ENCOUNTER — Other Ambulatory Visit (HOSPITAL_COMMUNITY): Payer: Self-pay | Admitting: *Deleted

## 2017-12-21 ENCOUNTER — Ambulatory Visit (HOSPITAL_COMMUNITY)
Admission: RE | Admit: 2017-12-21 | Discharge: 2017-12-21 | Disposition: A | Discharge status: Home / Self Care | Payer: PPO | Source: Ambulatory Visit | Attending: Obstetrics and Gynecology | Admitting: Obstetrics and Gynecology

## 2017-12-21 DIAGNOSIS — M810 Age-related osteoporosis without current pathological fracture: Secondary | ICD-10-CM | POA: Diagnosis not present

## 2017-12-21 MED ORDER — ZOLEDRONIC ACID 5 MG/100ML IV SOLN
5.0000 mg | Freq: Once | INTRAVENOUS | Status: AC
  Administered 2017-12-21: 5 mg via INTRAVENOUS

## 2017-12-21 MED ORDER — ZOLEDRONIC ACID 5 MG/100ML IV SOLN
INTRAVENOUS | Status: AC
  Administered 2017-12-21: 5 mg via INTRAVENOUS
  Filled 2017-12-21: qty 100

## 2017-12-29 ENCOUNTER — Other Ambulatory Visit: Payer: Self-pay | Admitting: Internal Medicine

## 2017-12-30 DIAGNOSIS — Z471 Aftercare following joint replacement surgery: Secondary | ICD-10-CM | POA: Diagnosis not present

## 2017-12-30 DIAGNOSIS — M1711 Unilateral primary osteoarthritis, right knee: Secondary | ICD-10-CM | POA: Diagnosis not present

## 2017-12-30 DIAGNOSIS — Z96651 Presence of right artificial knee joint: Secondary | ICD-10-CM | POA: Diagnosis not present

## 2018-03-07 ENCOUNTER — Ambulatory Visit: Payer: PPO | Admitting: Physician Assistant

## 2018-03-07 ENCOUNTER — Encounter (INDEPENDENT_AMBULATORY_CARE_PROVIDER_SITE_OTHER): Payer: Self-pay

## 2018-03-07 ENCOUNTER — Encounter: Payer: Self-pay | Admitting: Physician Assistant

## 2018-03-07 VITALS — BP 130/78 | HR 80 | Ht 62.0 in | Wt 160.4 lb

## 2018-03-07 DIAGNOSIS — R140 Abdominal distension (gaseous): Secondary | ICD-10-CM

## 2018-03-07 DIAGNOSIS — R109 Unspecified abdominal pain: Secondary | ICD-10-CM

## 2018-03-07 DIAGNOSIS — K5900 Constipation, unspecified: Secondary | ICD-10-CM | POA: Diagnosis not present

## 2018-03-07 DIAGNOSIS — R198 Other specified symptoms and signs involving the digestive system and abdomen: Secondary | ICD-10-CM | POA: Diagnosis not present

## 2018-03-07 NOTE — Progress Notes (Addendum)
Subjective:    Patient ID: Mary Hart, female    DOB: 1941/07/17, 77 y.o.   MRN: [id number]  HPI Mary Hart is a pleasant 77 year old white female known to Dr. Arelia Longest who was last seen here about 1 year ago.  She has history of chronic constipation and complaints of incomplete evacuation.  It was Dr. Evern Bio impression that she had pelvic floor dysfunction and she was referred to physical therapy last year.  She did have several sessions there, which she says really did not change her symptoms.  She says that the therapist said she may have only had slight muscle weakness. She comes back in today stating that she has had off-and-on constipation over the past year but more significant problems over the past 2 months with constipation.  She says the only time she is able to have a bowel movement is with taking milk of magnesia.  She gets bloated lower abdominal pressure and generalized discomfort if she does not have a bowel movement on a daily basis.  She is concerned because she is had some tenderness off and on in her left lower abdomen over the past couple months and ongoing bloating.  She took a large dose of milk of magnesia over the weekend, and says she feels like she flushed herself out.  Today was able to have a very small bowel movement.  No melena or hematochezia.  She also has frequent soilage of stool.  She has had some intermittent rectal pressure. Trial of Amitiza has been unsuccessful, she is uncertain of the dose that she tried previously. Last colonoscopy 2013 per Dr. Gala Romney with finding of a few left-sided diverticuli and internal hemorrhoids, there were no polyps.  Review of Systems;Pertinent positive and negative review of systems were noted in the above HPI section.  All other review of systems was otherwise negative.  Outpatient Encounter Medications as of 03/07/2018  Medication Sig  . ALPRAZolam (XANAX) 0.25 MG tablet Take 1 tablet by mouth 3 (three) times daily as needed  for anxiety.   . Aspirin-Salicylamide-Caffeine (BC HEADACHE POWDER PO) Take 1 packet by mouth. As needed  . calcium carbonate (OS-CAL) 1250 (500 Ca) MG chewable tablet Chew 2 tablets by mouth daily.  . cetirizine (ZYRTEC) 10 MG tablet Take 10 mg by mouth at bedtime.  Marland Kitchen ipratropium (ATROVENT) 0.06 % nasal spray Place 1-2 sprays into the nose daily as needed (for allergies.).   Marland Kitchen levothyroxine (SYNTHROID, LEVOTHROID) 50 MCG tablet Take 50 mcg by mouth daily before breakfast.   . magnesium hydroxide (MILK OF MAGNESIA) 400 MG/5ML suspension Take 15 mLs by mouth daily.  . meloxicam (MOBIC) 15 MG tablet Take 15 mg by mouth daily.  . ondansetron (ZOFRAN) 4 MG tablet Take 1 tablet (4 mg total) by mouth every 8 (eight) hours as needed for nausea or vomiting.  . Probiotic Product ([place]) CAPS Take 1 capsule by mouth daily.  . ranitidine (ZANTAC) 150 MG tablet TAKE 1 TABLET BY MOUTH TWICE DAILY  . Simethicone 180 MG CAPS Take 2 capsules by mouth 2 (two) times daily.  . [DISCONTINUED] AMITIZA 8 MCG capsule TAKE 1 CAPSULE BY MOUTH TWICE DAILY WITH A MEAL.  . [DISCONTINUED] polyethylene glycol powder (MIRALAX) powder Take 1 Container by mouth daily.   No facility-administered encounter medications on file as of 03/07/2018.    Allergies  Allergen Reactions  . Codeine Nausea And Vomiting  . Hydromorphone Hcl Itching  . Morphine Nausea And Vomiting  . Moxifloxacin  N/V/D, skin red. Tolerates Levaquin and Cipro.   . Sulfonamide Derivatives Other (See Comments)    "almost died" "pulse went down to 12"   Patient Active Problem List   Diagnosis Date Noted  . Pelvic floor weakness in female 04/14/2017  . OA (osteoarthritis) of knee 12/27/2016  . Leg weakness 02/12/2014  . Stiffness of joint, not elsewhere classified, lower leg 02/12/2014  . Difficulty in walking(719.7) 02/12/2014  . Irritable bowel syndrome 05/04/2012  . GERD (gastroesophageal reflux disease) 02/14/2012   Social  History   Socioeconomic History  . Marital status: Married    Spouse name: Not on file  . Number of children: 4  . Years of education: Not on file  . Highest education level: Not on file  Occupational History  . Occupation: Retired    Fish farm manager: RETIRED  Social Needs  . Financial resource strain: Not on file  . Food insecurity:    Worry: Not on file    Inability: Not on file  . Transportation needs:    Medical: Not on file    Non-medical: Not on file  Tobacco Use  . Smoking status: Former Smoker    Packs/day: 0.66    Years: 50.00    Pack years: 33.00    Types: Cigarettes  . Smokeless tobacco: Never Used  . Tobacco comment: 1/2 pack per day  Substance and Sexual Activity  . Alcohol use: No  . Drug use: No  . Sexual activity: Not on file  Lifestyle  . Physical activity:    Days per week: Not on file    Minutes per session: Not on file  . Stress: Not on file  Relationships  . Social connections:    Talks on phone: Not on file    Gets together: Not on file    Attends religious service: Not on file    Active member of club or organization: Not on file    Attends meetings of clubs or organizations: Not on file    Relationship status: Not on file  . Intimate partner violence:    Fear of current or ex partner: Not on file    Emotionally abused: Not on file    Physically abused: Not on file    Forced sexual activity: Not on file  Other Topics Concern  . Not on file  Social History Narrative   Married, 2 sons and 2 daughters   Husband chronically ill and she helps with care   She is retired    Ms. Bufford's family history includes Breast cancer in her mother; Colon cancer in her maternal uncle; Dementia in her father and mother.      Objective:    Vitals:   03/07/18 0906  BP: 130/78  Pulse: 80    Physical Exam; well-developed elderly white female in no acute distress, pleasant blood pressure 130/78 pulse 80, height 5 foot 2, weight 160, BMI 29.3.  HEENT  ;nontraumatic normocephalic EOMI PERRLA sclera anicteric, Oropharynx clear, Cardiovascular ;regular rate and rhythm with S1-S2 no murmur or gallop, Pulmonary ;clear bilaterally, Abdomen ;soft, she has some mild bilateral lower quadrant tenderness no guarding or rebound no palpable mass or hepatosplenomegaly bowel sounds are present, Rectal; exam not done, Ext; no clubbing cyanosis or edema skin warm dry, Neuro psych; alert and oriented, grossly nonfocal mood and affect appropriate       Assessment & Plan:   #32 77 year old white female with chronic constipation, IBS and component of pelvic floor laxity with increase in constipation,  lower abdominal discomfort and bloating over the past few months. Rule out occult colon lesion, rule out symptomatic diverticulosis  2 osteoarthritis 3.  GERD-stable on ranitidine  Plan; patient will be scheduled for colonoscopy with Dr. Arelia Longest.  Procedure was discussed in detail with the patient including indications risks and benefits and she is agreeable to proceed. We will try bulking her stool with Benefiber and a glass of water or prune juice on a daily basis. Trial of Linzess 145 mcg p.o. daily.  If this is effective she will call back for prescription, if ineffective will increase to 290 mcg p.o. daily. She is encouraged to use milk of magnesia only on an as-needed basis if above not helpful.  Amy Genia Harold PA-C 03/07/2018   Cc: Jake Samples, PA*   Agree with Ms. Genia Harold assessment and plan. Gatha Mayer, MD, [facility]

## 2018-03-07 NOTE — Patient Instructions (Signed)
If you are age 75 or older, your body mass index should be between 23-30. Your Body mass index is 29.34 kg/m. If this is out of the aforementioned range listed, please consider follow up with your Primary Care Provider.  Start Benefiber daily in water or prune juice. You can get this at your pharmacy, [address], [place], [place].  Start Linzess samples 145 mcg daily. If this is not helpful increase to 290 mcg daily. Call us back for a prescription if this seems to help the constipation.  Ask for Dr. Celesta Aver nurse. Call: [phone] choose option 2.   You have been scheduled for a colonoscopy. Please follow written instructions given to you at your visit today.  Please pick up your prep supplies at the pharmacy within the next 1-3 days. If you use inhalers (even only as needed), please bring them with you on the day of your procedure. Your physician has requested that you go to www.startemmi.com and enter the access code given to you at your visit today. This web site gives a general overview about your procedure. However, you should still follow specific instructions given to you by our office regarding your preparation for the procedure.

## 2018-03-09 ENCOUNTER — Telehealth: Payer: Self-pay | Admitting: Physician Assistant

## 2018-03-09 NOTE — Telephone Encounter (Signed)
Patient says she had not taken any recent doses of MOM or any other laxatives, only Linzess. She denies any nausea or abdominal pain. Samples of Linzess 72 mcg 2 boxes (8 pills) at the front desk for pick up. She will let us know if this works better.

## 2018-03-09 NOTE — Telephone Encounter (Signed)
Ok - sounds good- if 64mcg daily works well - can give her rx  For  Linzess 72 mcg daily #30 /11

## 2018-03-09 NOTE — Telephone Encounter (Signed)
Patient states the medium dose of linzess made her have diarrhea. Pt wants to know if she can try samples of the low dose. Pt also states she will be in town this afternoon if she needs to come pick them up.

## 2018-03-09 NOTE — Telephone Encounter (Signed)
noted 

## 2018-03-15 ENCOUNTER — Other Ambulatory Visit: Payer: Self-pay | Admitting: *Deleted

## 2018-03-15 ENCOUNTER — Telehealth: Payer: Self-pay | Admitting: Physician Assistant

## 2018-03-15 MED ORDER — LINACLOTIDE 72 MCG PO CAPS: ORAL_CAPSULE | ORAL | 3 refills | Status: DC

## 2018-03-15 NOTE — Telephone Encounter (Signed)
Sent the patient a prescription for the Linzess 72 mcg to [place], [place].

## 2018-03-20 ENCOUNTER — Encounter: Payer: Self-pay | Admitting: Internal Medicine

## 2018-03-27 ENCOUNTER — Encounter: Payer: Self-pay | Admitting: Internal Medicine

## 2018-03-27 ENCOUNTER — Ambulatory Visit (AMBULATORY_SURGERY_CENTER): Payer: PPO | Admitting: Internal Medicine

## 2018-03-27 VITALS — BP 130/64 | HR 62 | Temp 98.6°F | Resp 16 | Ht 62.0 in | Wt 160.0 lb

## 2018-03-27 DIAGNOSIS — K5900 Constipation, unspecified: Secondary | ICD-10-CM | POA: Diagnosis not present

## 2018-03-27 DIAGNOSIS — J449 Chronic obstructive pulmonary disease, unspecified: Secondary | ICD-10-CM | POA: Diagnosis not present

## 2018-03-27 DIAGNOSIS — E669 Obesity, unspecified: Secondary | ICD-10-CM | POA: Diagnosis not present

## 2018-03-27 DIAGNOSIS — R194 Change in bowel habit: Secondary | ICD-10-CM | POA: Diagnosis not present

## 2018-03-27 DIAGNOSIS — K219 Gastro-esophageal reflux disease without esophagitis: Secondary | ICD-10-CM | POA: Diagnosis not present

## 2018-03-27 MED ORDER — SODIUM CHLORIDE 0.9 % IV SOLN: 500.0000 mL | Freq: Once | INTRAVENOUS | Status: DC

## 2018-03-27 NOTE — Patient Instructions (Addendum)
No problems here other than the diverticulosis.  I think your colon does not function well overall.  I think ok to take milk of magnesia daily io that is ok.  Will discuss  No need for any more routine colonoscopy.  I appreciate the opportunity to care for you. Gatha Mayer, MD, FACG YOU HAD AN ENDOSCOPIC PROCEDURE TODAY AT [facility]:   Refer to the procedure report that was given to you for any specific questions about what was found during the examination.  If the procedure report does not answer your questions, please call your gastroenterologist to clarify.  If you requested that your care partner not be given the details of your procedure findings, then the procedure report has been included in a sealed envelope for you to review at your convenience later.  YOU SHOULD EXPECT: Some feelings of bloating in the abdomen. Passage of more gas than usual.  Walking can help get rid of the air that was put into your GI tract during the procedure and reduce the bloating. If you had a lower endoscopy (such as a colonoscopy or flexible sigmoidoscopy) you may notice spotting of blood in your stool or on the toilet paper. If you underwent a bowel prep for your procedure, you may not have a normal bowel movement for a few days.  Please Note:  You might notice some irritation and congestion in your nose or some drainage.  This is from the oxygen used during your procedure.  There is no need for concern and it should clear up in a day or so.  SYMPTOMS TO REPORT IMMEDIATELY:   Following lower endoscopy (colonoscopy or flexible sigmoidoscopy):  Excessive amounts of blood in the stool  Significant tenderness or worsening of abdominal pains  Swelling of the abdomen that is new, acute  Fever of 100F or higher  Please read handouts on diverticulosis and high fiber diet diet.     For urgent or emergent issues, a gastroenterologist can be reached at any hour by calling  [phone].   DIET:  We do recommend a small meal at first, but then you may proceed to your regular diet.  Drink plenty of fluids but you should avoid alcoholic beverages for 24 hours.  ACTIVITY:  You should plan to take it easy for the rest of today and you should NOT DRIVE or use heavy machinery until tomorrow (because of the sedation medicines used during the test).    FOLLOW UP: Our staff will call the number listed on your records the next business day following your procedure to check on you and address any questions or concerns that you may have regarding the information given to you following your procedure. If we do not reach you, we will leave a message.  However, if you are feeling well and you are not experiencing any problems, there is no need to return our call.  We will assume that you have returned to your regular daily activities without incident.  If any biopsies were taken you will be contacted by phone or by letter within the next 1-3 weeks.  Please call us at [phone] if you have not heard about the biopsies in 3 weeks.    SIGNATURES/CONFIDENTIALITY: You and/or your care partner have signed paperwork which will be entered into your electronic medical record.  These signatures attest to the fact that that the information above on your After Visit Summary has been reviewed and is understood.  Full  responsibility of the confidentiality of this discharge information lies with you and/or your care-partner.

## 2018-03-27 NOTE — Op Note (Addendum)
[facility] Patient Name: Mary Hart Procedure Date: 03/27/2018 10:00 AM MRN: [id number] Endoscopist: Gatha Mayer , MD Age: 77 Referring MD:  Date of Birth: [birth year] Gender: Female Account #: [id number] Procedure:                Colonoscopy Indications:              Change in bowel habits Medicines:                Propofol per Anesthesia, Monitored Anesthesia Care Procedure:                Pre-Anesthesia Assessment:                           - Prior to the procedure, a History and Physical                            was performed, and patient medications and                            allergies were reviewed. The patient's tolerance of                            previous anesthesia was also reviewed. The risks                            and benefits of the procedure and the sedation                            options and risks were discussed with the patient.                            All questions were answered, and informed consent                            was obtained. Prior Anticoagulants: The patient has                            taken no previous anticoagulant or antiplatelet                            agents. ASA Grade Assessment: II - A patient with                            mild systemic disease. After reviewing the risks                            and benefits, the patient was deemed in                            satisfactory condition to undergo the procedure.                           After obtaining informed consent, the colonoscope  was passed under direct vision. Throughout the                            procedure, the patient's blood pressure, pulse, and                            oxygen saturations were monitored continuously. The                            Colonoscope was introduced through the anus and                            advanced to the the cecum, identified by                            appendiceal orifice  and ileocecal valve. The                            colonoscopy was performed without difficulty. The                            patient tolerated the procedure well. The quality                            of the bowel preparation was excellent. The                            ileocecal valve, appendiceal orifice, and rectum                            were photographed. The bowel preparation used was                            Miralax. Scope In: 10:00:08 AM Scope Out: 10:10:51 AM Scope Withdrawal Time: 0 hours 6 minutes 25 seconds  Total Procedure Duration: 0 hours 10 minutes 43 seconds  Findings:                 The perianal and digital rectal examinations were                            normal.                           Multiple diverticula were found in the sigmoid                            colon. There was narrowing of the colon in                            association with the diverticular opening.                           The exam was otherwise without abnormality on  direct and retroflexion views. Complications:            No immediate complications. Estimated Blood Loss:     Estimated blood loss: none. Impression:               - Moderate diverticulosis in the sigmoid colon.                            There was narrowing of the colon in association                            with the diverticular opening.                           - The examination was otherwise normal on direct                            and retroflexion views.                           - No specimens collected. Recommendation:           - Patient has a contact number available for                            emergencies. The signs and symptoms of potential                            delayed complications were discussed with the                            patient. Return to normal activities tomorrow.                            Written discharge instructions were provided to the                             patient.                           - High fiber diet.                           - Continue present medications.                           - No repeat colonoscopy due to age and the absence                            of colonic polyps.                           - Fiber, Linzess vs MOM She will probably use 72 ug                            L:inzess qod Gatha Mayer, MD 03/27/2018 10:21:34 AM This report has been signed electronically.

## 2018-03-27 NOTE — Progress Notes (Signed)
Report to PACU, RN, vss, BBS= Clear.  

## 2018-03-27 NOTE — Progress Notes (Signed)
Pt's states no medical or surgical changes since previsit or office visit. 

## 2018-03-28 ENCOUNTER — Telehealth: Payer: Self-pay | Admitting: *Deleted

## 2018-03-28 NOTE — Telephone Encounter (Signed)
Called patient back. Patient stating she had drank warm fluids, taken a gasx and then  soda water and ginger ale.mush improved now 1/10, large amount of gas passed.  Patient still inquiring if her bowel function would improve(related to comment in procedure report) or  if she will have to live this way, as preprocedure. And if repeated CDiff infections had caused her problem.

## 2018-03-28 NOTE — Telephone Encounter (Signed)
Called patient back with Dr. Celesta Aver answers. Patient verbalized satisfaction with his comments. Patient stating she will call his office with any issues that arise.

## 2018-03-28 NOTE — Telephone Encounter (Signed)
  Follow up Call-  Call back number 03/27/2018  Post procedure Call Back phone  # [phone]  Permission to leave phone message Yes  Some recent data might be hidden     Patient questions:  Do you have a fever, pain , or abdominal swelling? Yes.   Pain Score  5 *  Have you tolerated food without any problems? Yes.    Have you been able to return to your normal activities? No.  Do you have any questions about your discharge instructions: Diet   No. Medications  No. Follow up visit  No.  Do you have questions or concerns about your Care? Yes.    Actions: * If pain score is 4 or above: Physician/ provider Notified : Mary Rusk, MD  Patient stating she had severe pain left lower quadrant pain  last evening, 8/10. She did experience some nausea.Patient was able to pass some gas last evening.. This am patient is a 5/10 left lower abdominal pain. Patient feels like there is an area on the left side of the abdomen above her waist that is like a "knot". Otherwise her abdomen is soft.  Patient denies fever,nausea,vomiting or rectal bleeding. Instructed the patient(who was still in bed) to walk about her home. Drink warm fluids, take a Gas x and even get on her hands and knees on her bed and rock back and forth to relieve gas. Patient also questioning statement on AVS that her colon is not functioning.patient questioned if this is related to CDif infections and will she ever recover from nonfunctioning bowel. Note to Dr Carlean Purl for advice.

## 2018-03-28 NOTE — Telephone Encounter (Signed)
Bowel function issues are chronic and probably part of an irritable bowel syndrome issue.  They are not dangerous but symptoms may come and go.  Her diverticulosis may have something to do with it as well.  My sense is that she will need to continue treatment as she is.  She certainly may make a follow-up visit to review.

## 2018-03-28 NOTE — Telephone Encounter (Signed)
Agree with RN recommendations and we need to check on her again in early PM please

## 2018-04-12 ENCOUNTER — Other Ambulatory Visit: Payer: Self-pay | Admitting: Internal Medicine

## 2018-04-20 DIAGNOSIS — Z6830 Body mass index (BMI) 30.0-30.9, adult: Secondary | ICD-10-CM | POA: Diagnosis not present

## 2018-04-20 DIAGNOSIS — L259 Unspecified contact dermatitis, unspecified cause: Secondary | ICD-10-CM | POA: Diagnosis not present

## 2018-04-20 DIAGNOSIS — Z1389 Encounter for screening for other disorder: Secondary | ICD-10-CM | POA: Diagnosis not present

## 2018-04-20 DIAGNOSIS — E6609 Other obesity due to excess calories: Secondary | ICD-10-CM | POA: Diagnosis not present

## 2018-06-12 DIAGNOSIS — Z1389 Encounter for screening for other disorder: Secondary | ICD-10-CM | POA: Diagnosis not present

## 2018-06-12 DIAGNOSIS — R7309 Other abnormal glucose: Secondary | ICD-10-CM | POA: Diagnosis not present

## 2018-06-12 DIAGNOSIS — Z0001 Encounter for general adult medical examination with abnormal findings: Secondary | ICD-10-CM | POA: Diagnosis not present

## 2018-06-12 DIAGNOSIS — Z6828 Body mass index (BMI) 28.0-28.9, adult: Secondary | ICD-10-CM | POA: Diagnosis not present

## 2018-06-12 DIAGNOSIS — E663 Overweight: Secondary | ICD-10-CM | POA: Diagnosis not present

## 2018-06-12 DIAGNOSIS — E559 Vitamin D deficiency, unspecified: Secondary | ICD-10-CM | POA: Diagnosis not present

## 2018-06-12 DIAGNOSIS — E039 Hypothyroidism, unspecified: Secondary | ICD-10-CM | POA: Diagnosis not present

## 2018-06-12 DIAGNOSIS — Z0000 Encounter for general adult medical examination without abnormal findings: Secondary | ICD-10-CM | POA: Diagnosis not present

## 2018-06-12 DIAGNOSIS — M1991 Primary osteoarthritis, unspecified site: Secondary | ICD-10-CM | POA: Diagnosis not present

## 2018-06-12 DIAGNOSIS — J449 Chronic obstructive pulmonary disease, unspecified: Secondary | ICD-10-CM | POA: Diagnosis not present

## 2018-06-12 DIAGNOSIS — K219 Gastro-esophageal reflux disease without esophagitis: Secondary | ICD-10-CM | POA: Diagnosis not present

## 2018-06-12 DIAGNOSIS — E538 Deficiency of other specified B group vitamins: Secondary | ICD-10-CM | POA: Diagnosis not present

## 2018-06-12 DIAGNOSIS — G8929 Other chronic pain: Secondary | ICD-10-CM | POA: Diagnosis not present

## 2018-06-14 DIAGNOSIS — Z79899 Other long term (current) drug therapy: Secondary | ICD-10-CM | POA: Diagnosis not present

## 2018-06-14 DIAGNOSIS — M1990 Unspecified osteoarthritis, unspecified site: Secondary | ICD-10-CM | POA: Diagnosis not present

## 2018-06-14 DIAGNOSIS — M150 Primary generalized (osteo)arthritis: Secondary | ICD-10-CM | POA: Diagnosis not present

## 2018-06-14 DIAGNOSIS — Z6826 Body mass index (BMI) 26.0-26.9, adult: Secondary | ICD-10-CM | POA: Diagnosis not present

## 2018-06-14 DIAGNOSIS — E663 Overweight: Secondary | ICD-10-CM | POA: Diagnosis not present

## 2018-06-14 DIAGNOSIS — M2550 Pain in unspecified joint: Secondary | ICD-10-CM | POA: Diagnosis not present

## 2018-06-14 DIAGNOSIS — M3500 Sicca syndrome, unspecified: Secondary | ICD-10-CM | POA: Diagnosis not present

## 2018-10-16 DIAGNOSIS — Z961 Presence of intraocular lens: Secondary | ICD-10-CM | POA: Diagnosis not present

## 2018-10-16 DIAGNOSIS — H353131 Nonexudative age-related macular degeneration, bilateral, early dry stage: Secondary | ICD-10-CM | POA: Diagnosis not present

## 2018-10-16 DIAGNOSIS — H524 Presbyopia: Secondary | ICD-10-CM | POA: Diagnosis not present

## 2018-10-16 DIAGNOSIS — H00025 Hordeolum internum left lower eyelid: Secondary | ICD-10-CM | POA: Diagnosis not present

## 2018-11-08 ENCOUNTER — Ambulatory Visit: Payer: PPO | Admitting: Physician Assistant

## 2018-11-08 ENCOUNTER — Encounter: Payer: Self-pay | Admitting: Physician Assistant

## 2018-11-08 VITALS — BP 146/76 | HR 86 | Ht 62.0 in | Wt 158.0 lb

## 2018-11-08 DIAGNOSIS — K5909 Other constipation: Secondary | ICD-10-CM | POA: Diagnosis not present

## 2018-11-08 DIAGNOSIS — Z8719 Personal history of other diseases of the digestive system: Secondary | ICD-10-CM

## 2018-11-08 DIAGNOSIS — K5730 Diverticulosis of large intestine without perforation or abscess without bleeding: Secondary | ICD-10-CM | POA: Diagnosis not present

## 2018-11-08 MED ORDER — ONDANSETRON HCL 4 MG PO TABS: 4.0000 mg | ORAL_TABLET | Freq: Three times a day (TID) | ORAL | 3 refills | Status: DC | PRN

## 2018-11-08 MED ORDER — LINACLOTIDE 72 MCG PO CAPS: ORAL_CAPSULE | ORAL | 3 refills | Status: DC

## 2018-11-08 MED ORDER — ALPRAZOLAM 0.25 MG PO TABS: ORAL_TABLET | ORAL | 0 refills | Status: DC

## 2018-11-08 NOTE — Patient Instructions (Addendum)
We sent refills to your pharmacy, [facility] [address], [place], [place].  1. Xanax 0.25 mg 2. Linzess 72 mcg  Start IB Gard IBS capsules.You can get this at [facility].  Take 2 capsules 3 times daily.  Stat the probiotic daily. Start Senokot 1-2 at bedtime.   Call us back in 2 weeks with an update.   Normal BMI (Body Mass Index- based on height and weight) is between 23 and 30. Your BMI today is Body mass index is 28.9 kg/m. Marland Kitchen Please consider follow up  regarding your BMI with your Primary Care Provider.

## 2018-11-08 NOTE — Progress Notes (Signed)
Subjective:    Patient ID: Mary Hart, female    DOB: Apr 28, 1941, 78 y.o.   MRN: [id number]  HPI [name] is a pleasant 78 year old white female known to Dr. Carlean Purl who was last seen in the office in May 2019 by myself.  She has history of chronic constipation, GERD, IBS and pelvic floor laxity. She comes back in today with ongoing complaints of constipation and incomplete bowel evacuation.  She also has been having occasional right and left-sided abdominal discomfort. He had undergone colonoscopy in June 2019 with finding of multiple diverticuli in the sigmoid colon and some narrowing associated with the diverticulosis.  There were no polyps. She had previously been given a prescription for Linzess 145 mcg daily apparently this caused diarrhea and she is currently using Linzess 72 mcg daily.  She is able to have a small bowel movement most days but says she just passes a very small amount of stool, and then is bothered with an ongoing sense of need to have a bowel movement is generally unable to pass any further stool.  She says as the week goes on she will become more uncomfortable bloated and gassy and then usually purges her bowel about once a week taking 3 or 4 Dulcolax.  Unfortunately the Dulcolax makes her sick sometimes with severe cramping but she will completely evacuate her bowel.  She was unsuccessful with Benefiber as it caused bloating. MiraLAX also causes her abdominal bloating and discomfort. We reviewed her meds and she has been taking meloxicam on a regular basis long-term.  She is not sure if this is actually helping her arthritis symptoms which is been primarily in her hands. She had also previously been on ranitidine for GERD.  She has not been on any prescription medicine since Zantac was taken off the market.  Not currently having any problems with heartburn or indigestion. She also mentions that Xanax is very helpful to her when she is having a bad day with GI symptoms  and wonders if we could refill.  Review of Systems; Pertinent positive and negative review of systems were noted in the above HPI section.  All other review of systems was otherwise negative.  Outpatient Encounter Medications as of 11/08/2018  Medication Sig  . ALPRAZolam (XANAX) 0.25 MG tablet Take 1 tablet by mouth as needed for anxiety.  . Aspirin-Salicylamide-Caffeine (BC HEADACHE POWDER PO) Take 1 packet by mouth. As needed  . calcium carbonate (OS-CAL) 1250 (500 Ca) MG chewable tablet Chew 2 tablets by mouth daily.  . cetirizine (ZYRTEC) 10 MG tablet Take 10 mg by mouth at bedtime.  Marland Kitchen levothyroxine (SYNTHROID, LEVOTHROID) 50 MCG tablet Take 50 mcg by mouth daily before breakfast.   . linaclotide (LINZESS) 72 MCG capsule Take 1 tablet by mouth every morning.  . magnesium hydroxide (MILK OF MAGNESIA) 400 MG/5ML suspension Take 15 mLs by mouth daily.  . meloxicam (MOBIC) 15 MG tablet Take 15 mg by mouth daily.  . ondansetron (ZOFRAN) 4 MG tablet Take 1 tablet (4 mg total) by mouth every 8 (eight) hours as needed for nausea or vomiting.  . Probiotic Product ([place]) CAPS Take 1 capsule by mouth daily.  . [DISCONTINUED] ALPRAZolam (XANAX) 0.25 MG tablet Take 1 tablet by mouth 3 (three) times daily as needed for anxiety.   . [DISCONTINUED] linaclotide (LINZESS) 72 MCG capsule Take 1 tablet by mouth every morning.  . [DISCONTINUED] ondansetron (ZOFRAN) 4 MG tablet Take 1 tablet (4 mg total) by mouth every  8 (eight) hours as needed for nausea or vomiting.  . [DISCONTINUED] ipratropium (ATROVENT) 0.06 % nasal spray Place 1-2 sprays into the nose daily as needed (for allergies.).   . [DISCONTINUED] ranitidine (ZANTAC) 150 MG tablet TAKE 1 TABLET BY MOUTH TWICE DAILY  . [DISCONTINUED] Simethicone 180 MG CAPS Take 2 capsules by mouth 2 (two) times daily.   No facility-administered encounter medications on file as of 11/08/2018.    Allergies  Allergen Reactions  . Codeine Nausea And  Vomiting  . Hydromorphone Hcl Itching  . Morphine Nausea And Vomiting  . Moxifloxacin     N/V/D, skin red. Tolerates Levaquin and Cipro.   . Sulfonamide Derivatives Other (See Comments)    "almost died" "pulse went down to 12"   Patient Active Problem List   Diagnosis Date Noted  . Pelvic floor weakness in female 04/14/2017  . OA (osteoarthritis) of knee 12/27/2016  . Leg weakness 02/12/2014  . Stiffness of joint, not elsewhere classified, lower leg 02/12/2014  . Difficulty in walking(719.7) 02/12/2014  . Irritable bowel syndrome 05/04/2012  . GERD (gastroesophageal reflux disease) 02/14/2012   Social History   Socioeconomic History  . Marital status: Married    Spouse name: Not on file  . Number of children: 4  . Years of education: Not on file  . Highest education level: Not on file  Occupational History  . Occupation: Retired    Fish farm manager: RETIRED  Social Needs  . Financial resource strain: Not on file  . Food insecurity:    Worry: Not on file    Inability: Not on file  . Transportation needs:    Medical: Not on file    Non-medical: Not on file  Tobacco Use  . Smoking status: Former Smoker    Packs/day: 0.66    Years: 50.00    Pack years: 33.00    Types: Cigarettes  . Smokeless tobacco: Never Used  . Tobacco comment: 1/2 pack per day  Substance and Sexual Activity  . Alcohol use: No  . Drug use: No  . Sexual activity: Not on file  Lifestyle  . Physical activity:    Days per week: Not on file    Minutes per session: Not on file  . Stress: Not on file  Relationships  . Social connections:    Talks on phone: Not on file    Gets together: Not on file    Attends religious service: Not on file    Active member of club or organization: Not on file    Attends meetings of clubs or organizations: Not on file    Relationship status: Not on file  . Intimate partner violence:    Fear of current or ex partner: Not on file    Emotionally abused: Not on file     Physically abused: Not on file    Forced sexual activity: Not on file  Other Topics Concern  . Not on file  Social History Narrative   Married, 2 sons and 2 daughters   Husband chronically ill and she helps with care   She is retired    Ms. Turnipseed's family history includes Breast cancer in her mother; Colon cancer in her maternal uncle; Dementia in her father and mother.      Objective:    Vitals:   11/08/18 1010  BP: (!) 146/76  Pulse: 86    Physical Exam; well-developed older white female in no acute distress, she was a bit tearful at onset of interview.  Height 5 foot 2, weight 158, BMI 28.9.  HEENT; nontraumatic normocephalic EOMI PERRLA sclera anicteric, oral mucosa moist.  Cardiovascular ;regular rate and rhythm with S1-S2 no murmur rub or gallop, Pulmonary; clear bilaterally, Abdomen; soft, nondistended, bowel sounds are present or some very mild bilateral lower quadrant tenderness no guarding or rebound no palpable mass or hepatosplenomegaly sounds active, Rectal ;exam not done, Extremities ;no clubbing cyanosis or edema skin warm and dry, Neuropsych; alert and oriented, grossly nonfocal mood and affect appropriate       Assessment & Plan:   #22 any 39-year-old white female chronic constipation, IBS, and incomplete bowel evacuation. Current regimen of Linzess 72 mcg daily is helpful but does not help her evacuate her bowel adequately.  She describes abdominal bloating and discomfort consistent with IBS generally improved after a bowel purge.  #2 sigmoid diverticulosis #3.  History of GERD-currently not requiring any medication had previously been on ranitidine  Plan; Patient had asked about a probiotic, she would like to start ultra flora which should be fine and asked her to take this for 4 to 6 weeks and decide if helpful. Continue Linzess 72 mcg daily for now, and add Senokot 1-2 at bedtime. Hopefully if we can get her bowels moving a bit better she will not have to go  through a purge on a weekly basis. Refill Zofran 4 mg every 6 hours as needed for nausea. .Stop meloxicam Will refill Xanax 0.25 p.o. daily as needed which she has used very sparingly #20 and no refills and asked her to get further refills through her PCP. Start trial of IBgard 2 p.o. 3 times daily. She will call back with a progress report in a couple of weeks.  Libia Fazzini Genia Harold PA-C 11/08/2018   Cc: Jake Samples, PA*

## 2018-11-23 DIAGNOSIS — H353132 Nonexudative age-related macular degeneration, bilateral, intermediate dry stage: Secondary | ICD-10-CM | POA: Diagnosis not present

## 2018-11-23 DIAGNOSIS — H43813 Vitreous degeneration, bilateral: Secondary | ICD-10-CM | POA: Diagnosis not present

## 2018-11-23 DIAGNOSIS — H35033 Hypertensive retinopathy, bilateral: Secondary | ICD-10-CM | POA: Diagnosis not present

## 2018-11-23 DIAGNOSIS — H35423 Microcystoid degeneration of retina, bilateral: Secondary | ICD-10-CM | POA: Diagnosis not present

## 2018-11-28 DIAGNOSIS — J019 Acute sinusitis, unspecified: Secondary | ICD-10-CM | POA: Diagnosis not present

## 2018-11-28 DIAGNOSIS — Z6829 Body mass index (BMI) 29.0-29.9, adult: Secondary | ICD-10-CM | POA: Diagnosis not present

## 2018-11-28 DIAGNOSIS — J309 Allergic rhinitis, unspecified: Secondary | ICD-10-CM | POA: Diagnosis not present

## 2018-11-28 DIAGNOSIS — Z1389 Encounter for screening for other disorder: Secondary | ICD-10-CM | POA: Diagnosis not present

## 2018-11-28 DIAGNOSIS — E663 Overweight: Secondary | ICD-10-CM | POA: Diagnosis not present

## 2018-12-12 DIAGNOSIS — M816 Localized osteoporosis [Lequesne]: Secondary | ICD-10-CM | POA: Diagnosis not present

## 2018-12-12 DIAGNOSIS — L292 Pruritus vulvae: Secondary | ICD-10-CM | POA: Diagnosis not present

## 2018-12-12 DIAGNOSIS — Z6828 Body mass index (BMI) 28.0-28.9, adult: Secondary | ICD-10-CM | POA: Diagnosis not present

## 2018-12-12 DIAGNOSIS — N958 Other specified menopausal and perimenopausal disorders: Secondary | ICD-10-CM | POA: Diagnosis not present

## 2018-12-12 DIAGNOSIS — Z01419 Encounter for gynecological examination (general) (routine) without abnormal findings: Secondary | ICD-10-CM | POA: Diagnosis not present

## 2018-12-12 DIAGNOSIS — M810 Age-related osteoporosis without current pathological fracture: Secondary | ICD-10-CM | POA: Diagnosis not present

## 2018-12-12 DIAGNOSIS — Z1231 Encounter for screening mammogram for malignant neoplasm of breast: Secondary | ICD-10-CM | POA: Diagnosis not present

## 2018-12-12 LAB — HM DEXA SCAN

## 2018-12-27 DIAGNOSIS — M810 Age-related osteoporosis without current pathological fracture: Secondary | ICD-10-CM | POA: Diagnosis not present

## 2018-12-27 DIAGNOSIS — R799 Abnormal finding of blood chemistry, unspecified: Secondary | ICD-10-CM | POA: Diagnosis not present

## 2019-01-09 ENCOUNTER — Other Ambulatory Visit (HOSPITAL_COMMUNITY): Payer: Self-pay | Admitting: *Deleted

## 2019-01-10 ENCOUNTER — Inpatient Hospital Stay (HOSPITAL_COMMUNITY): Admission: RE | Admit: 2019-01-10 | Payer: PPO | Source: Ambulatory Visit

## 2019-02-06 DIAGNOSIS — Z01419 Encounter for gynecological examination (general) (routine) without abnormal findings: Secondary | ICD-10-CM | POA: Diagnosis not present

## 2019-02-06 DIAGNOSIS — N8111 Cystocele, midline: Secondary | ICD-10-CM | POA: Diagnosis not present

## 2019-02-06 DIAGNOSIS — Z79890 Hormone replacement therapy (postmenopausal): Secondary | ICD-10-CM | POA: Diagnosis not present

## 2019-02-06 DIAGNOSIS — Z1389 Encounter for screening for other disorder: Secondary | ICD-10-CM | POA: Diagnosis not present

## 2019-02-06 DIAGNOSIS — Z681 Body mass index (BMI) 19 or less, adult: Secondary | ICD-10-CM | POA: Diagnosis not present

## 2019-02-06 DIAGNOSIS — Z0001 Encounter for general adult medical examination with abnormal findings: Secondary | ICD-10-CM | POA: Diagnosis not present

## 2019-02-06 DIAGNOSIS — E039 Hypothyroidism, unspecified: Secondary | ICD-10-CM | POA: Diagnosis not present

## 2019-02-06 DIAGNOSIS — J449 Chronic obstructive pulmonary disease, unspecified: Secondary | ICD-10-CM | POA: Diagnosis not present

## 2019-02-06 DIAGNOSIS — Z130 Encounter for screening for diseases of the blood and blood-forming organs and certain disorders involving the immune mechanism: Secondary | ICD-10-CM | POA: Diagnosis not present

## 2019-03-05 DIAGNOSIS — Z03818 Encounter for observation for suspected exposure to other biological agents ruled out: Secondary | ICD-10-CM | POA: Diagnosis not present

## 2019-03-28 DIAGNOSIS — Z96651 Presence of right artificial knee joint: Secondary | ICD-10-CM

## 2019-03-28 HISTORY — DX: Presence of right artificial knee joint: Z96.651

## 2019-03-29 DIAGNOSIS — M7051 Other bursitis of knee, right knee: Secondary | ICD-10-CM | POA: Diagnosis not present

## 2019-03-29 DIAGNOSIS — M170 Bilateral primary osteoarthritis of knee: Secondary | ICD-10-CM | POA: Diagnosis not present

## 2019-03-29 DIAGNOSIS — M1711 Unilateral primary osteoarthritis, right knee: Secondary | ICD-10-CM | POA: Diagnosis not present

## 2019-03-29 DIAGNOSIS — E559 Vitamin D deficiency, unspecified: Secondary | ICD-10-CM | POA: Diagnosis not present

## 2019-03-29 DIAGNOSIS — M1712 Unilateral primary osteoarthritis, left knee: Secondary | ICD-10-CM | POA: Diagnosis not present

## 2019-04-16 ENCOUNTER — Encounter (HOSPITAL_COMMUNITY): Payer: PPO

## 2019-04-23 ENCOUNTER — Other Ambulatory Visit: Payer: Self-pay

## 2019-04-23 DIAGNOSIS — R6889 Other general symptoms and signs: Secondary | ICD-10-CM | POA: Diagnosis not present

## 2019-04-23 DIAGNOSIS — Z20822 Contact with and (suspected) exposure to covid-19: Secondary | ICD-10-CM

## 2019-04-25 DIAGNOSIS — M810 Age-related osteoporosis without current pathological fracture: Secondary | ICD-10-CM | POA: Diagnosis not present

## 2019-04-26 LAB — NOVEL CORONAVIRUS, NAA: SARS-CoV-2, NAA: NOT DETECTED

## 2019-05-07 ENCOUNTER — Other Ambulatory Visit (HOSPITAL_COMMUNITY): Payer: Self-pay | Admitting: *Deleted

## 2019-05-08 ENCOUNTER — Other Ambulatory Visit: Payer: Self-pay

## 2019-05-08 ENCOUNTER — Ambulatory Visit (HOSPITAL_COMMUNITY)
Admission: RE | Admit: 2019-05-08 | Discharge: 2019-05-08 | Disposition: A | Discharge status: Home / Self Care | Payer: PPO | Source: Ambulatory Visit | Attending: Obstetrics and Gynecology | Admitting: Obstetrics and Gynecology

## 2019-05-08 DIAGNOSIS — M810 Age-related osteoporosis without current pathological fracture: Secondary | ICD-10-CM | POA: Diagnosis not present

## 2019-05-08 MED ORDER — ZOLEDRONIC ACID 5 MG/100ML IV SOLN
5.0000 mg | Freq: Once | INTRAVENOUS | Status: AC
  Administered 2019-05-08: 5 mg via INTRAVENOUS

## 2019-05-08 MED ORDER — ZOLEDRONIC ACID 5 MG/100ML IV SOLN
INTRAVENOUS | Status: AC
  Administered 2019-05-08: 10:00:00 5 mg via INTRAVENOUS
  Filled 2019-05-08: qty 100

## 2019-05-16 DIAGNOSIS — E559 Vitamin D deficiency, unspecified: Secondary | ICD-10-CM | POA: Diagnosis not present

## 2019-05-28 DIAGNOSIS — E039 Hypothyroidism, unspecified: Secondary | ICD-10-CM | POA: Diagnosis not present

## 2019-05-28 DIAGNOSIS — E663 Overweight: Secondary | ICD-10-CM | POA: Diagnosis not present

## 2019-05-28 DIAGNOSIS — Z6827 Body mass index (BMI) 27.0-27.9, adult: Secondary | ICD-10-CM | POA: Diagnosis not present

## 2019-05-28 DIAGNOSIS — T148XXD Other injury of unspecified body region, subsequent encounter: Secondary | ICD-10-CM | POA: Diagnosis not present

## 2019-08-19 DIAGNOSIS — R0981 Nasal congestion: Secondary | ICD-10-CM | POA: Diagnosis not present

## 2019-08-19 DIAGNOSIS — J3489 Other specified disorders of nose and nasal sinuses: Secondary | ICD-10-CM | POA: Diagnosis not present

## 2019-10-20 ENCOUNTER — Other Ambulatory Visit: Payer: Self-pay

## 2019-10-20 ENCOUNTER — Ambulatory Visit
Admission: EM | Admit: 2019-10-20 | Discharge: 2019-10-20 | Disposition: A | Discharge status: Home / Self Care | Payer: PPO | Attending: Emergency Medicine | Admitting: Emergency Medicine

## 2019-10-20 ENCOUNTER — Encounter: Payer: Self-pay | Admitting: Emergency Medicine

## 2019-10-20 DIAGNOSIS — M79675 Pain in left toe(s): Secondary | ICD-10-CM | POA: Diagnosis not present

## 2019-10-20 DIAGNOSIS — M10072 Idiopathic gout, left ankle and foot: Secondary | ICD-10-CM

## 2019-10-20 DIAGNOSIS — M109 Gout, unspecified: Secondary | ICD-10-CM

## 2019-10-20 MED ORDER — METHYLPREDNISOLONE SODIUM SUCC 125 MG IJ SOLR
125.0000 mg | Freq: Once | INTRAMUSCULAR | Status: AC
  Administered 2019-10-20: 125 mg via INTRAMUSCULAR

## 2019-10-20 MED ORDER — PREDNISONE 20 MG PO TABS: 20.0000 mg | ORAL_TABLET | Freq: Two times a day (BID) | ORAL | 0 refills | Status: AC

## 2019-10-20 NOTE — ED Triage Notes (Addendum)
Pt presents to UC w/ c/o left great toe pain x1-2 weeks. Pt's left great toe is reddened and swollen. Pt denies injury.

## 2019-10-20 NOTE — ED Provider Notes (Signed)
[name]   ED:[id number] 10/20/19 Arrival Time: 1024  CC: LT great toe pain  SUBJECTIVE: History from: patient. Mary Hart is a 78 y.o. female complains of LT great toe pain that began 1-2 weeks ago.  Denies a precipitating event or specific injury, but does admit to eat more grapes than usual.  Localizes the pain to the LT great toe.  Describes the pain as constant and burning in character.  Has tried OTC medications including BC powder without relief.  Symptoms are made worse with walking on it.  Denies similar symptoms in the past.  Complains of redness and swelling.  Denies fever, chills, ecchymosis, weakness, numbness and tingling.  ROS: As per HPI.  All other pertinent ROS negative.     Past Medical History:  Diagnosis Date  . Anxiety   . Arthritis    oa  . Clostridium difficile infection 2013, 2015   Treated with vancomycin  . COPD (chronic obstructive pulmonary disease) ([place])   . Depression   . Diverticula of colon   . Genital herpes   . GERD (gastroesophageal reflux disease)   . Hypothyroid   . IBS (irritable bowel syndrome)   . Internal hemorrhoids   . Jaundice    as child cause unknown  . Neuropathy    fingers both hands  . PONV (postoperative nausea and vomiting)    did well with last neck fusion   . Recurrent colitis due to Clostridium difficile 07/03/2013   4 treatments with vancomycin since late 2013/early 2014 - responds then relapses    . Vocal cord dysfunction    SYASMONIC DYSPHONIA   Past Surgical History:  Procedure Laterality Date  . ABDOMINAL HYSTERECTOMY    . Anterior Cervical Fusion X 2    . CHOLECYSTECTOMY    . COLONOSCOPY  06/20/2008   [place] Dr. Wynelle Bourgeois external hemorrhoids  . COLONOSCOPY  10/12/2012   Dr. Gala Romney- internal hemorrhoids, very early, few, sigmoid diverticula o/w normal appearing colon and terminal ileum. + cdiff on stool samples taken. Segmental bx negative.  . ESOPHAGOGASTRODUODENOSCOPY  11/25/10   [place]- tubular  esophagus s/p of 56 dilator from a small HH/otherwise normal  . EYE SURGERY Bilateral 2012   ioc with lens replacement for cataracts  . JOINT REPLACEMENT  09/2010   Thumb left  . meniscus repair right knee  2014  . TOTAL KNEE ARTHROPLASTY Right 12/27/2016   Procedure: RIGHT TOTAL KNEE ARTHROPLASTY;  Surgeon: Gaynelle Arabian, MD;  Location: [facility];  Service: Orthopedics;  Laterality: Right;  Adductor Block  . VESICOVAGINAL FISTULA CLOSURE W/ TAH     Allergies  Allergen Reactions  . Codeine Nausea And Vomiting  . Hydromorphone Hcl Itching  . Morphine Nausea And Vomiting  . Moxifloxacin     N/V/D, skin red. Tolerates Levaquin and Cipro.   . Sulfonamide Derivatives Other (See Comments)    "almost died" "pulse went down to 12"   No current facility-administered medications on file prior to encounter.   Current Outpatient Medications on File Prior to Encounter  Medication Sig Dispense Refill  . ALPRAZolam (XANAX) 0.25 MG tablet Take 1 tablet by mouth as needed for anxiety. 30 tablet 0  . Aspirin-Salicylamide-Caffeine (BC HEADACHE POWDER PO) Take 1 packet by mouth. As needed    . calcium carbonate (OS-CAL) 1250 (500 Ca) MG chewable tablet Chew 2 tablets by mouth daily.    . cetirizine (ZYRTEC) 10 MG tablet Take 10 mg by mouth at bedtime.    Marland Kitchen levothyroxine (SYNTHROID,  LEVOTHROID) 50 MCG tablet Take 50 mcg by mouth daily before breakfast.     . linaclotide (LINZESS) 72 MCG capsule Take 1 tablet by mouth every morning. 30 capsule 3  . magnesium hydroxide (MILK OF MAGNESIA) 400 MG/5ML suspension Take 15 mLs by mouth daily.    . meloxicam (MOBIC) 15 MG tablet Take 15 mg by mouth daily.    . ondansetron (ZOFRAN) 4 MG tablet Take 1 tablet (4 mg total) by mouth every 8 (eight) hours as needed for nausea or vomiting. 30 tablet 3  . Probiotic Product ([place]) CAPS Take 1 capsule by mouth daily.     Social History   Socioeconomic History  . Marital status: Married    Spouse name: Not  on file  . Number of children: 4  . Years of education: Not on file  . Highest education level: Not on file  Occupational History  . Occupation: Retired    Fish farm manager: RETIRED  Tobacco Use  . Smoking status: Former Smoker    Packs/day: 0.66    Years: 50.00    Pack years: 33.00    Types: Cigarettes  . Smokeless tobacco: Never Used  . Tobacco comment: 1/2 pack per day  Substance and Sexual Activity  . Alcohol use: No  . Drug use: No  . Sexual activity: Not on file  Other Topics Concern  . Not on file  Social History Narrative   Married, 2 sons and 2 daughters   Husband chronically ill and she helps with care   She is retired   Scientist, physiological Strain:   . Difficulty of Paying Living Expenses: Not on file  Food Insecurity:   . Worried About Charity fundraiser in the Last Year: Not on file  . Ran Out of Food in the Last Year: Not on file  Transportation Needs:   . Lack of Transportation (Medical): Not on file  . Lack of Transportation (Non-Medical): Not on file  Physical Activity:   . Days of Exercise per Week: Not on file  . Minutes of Exercise per Session: Not on file  Stress:   . Feeling of Stress : Not on file  Social Connections:   . Frequency of Communication with Friends and Family: Not on file  . Frequency of Social Gatherings with Friends and Family: Not on file  . Attends Religious Services: Not on file  . Active Member of Clubs or Organizations: Not on file  . Attends Archivist Meetings: Not on file  . Marital Status: Not on file  Intimate Partner Violence:   . Fear of Current or Ex-Partner: Not on file  . Emotionally Abused: Not on file  . Physically Abused: Not on file  . Sexually Abused: Not on file   Family History  Problem Relation Age of Onset  . Dementia Father   . Dementia Mother   . Breast cancer Mother   . Colon cancer Maternal Uncle   . Allergic rhinitis Neg Hx   . Asthma Neg Hx   . Eczema Neg  Hx   . Urticaria Neg Hx     OBJECTIVE: HR:  84 bpm BP: 172/84 Temp: 97.6 F Pulse ox: 96% on RA RR: 16  General appearance: ALERT; in no acute distress.  Head: NCAT Lungs: Normal respiratory effort CV: Dorsalis pedis pulses 2+ bilaterally. Cap refill < 2 seconds Musculoskeletal: LT foot Inspection: Erythema and swelling over first MTP joint Palpation: TTP over first  MTP joint ROM: LROM Skin: warm and dry Neurologic: Ambulates without difficulty; Sensation intact about the lower extremities Psychological: alert and cooperative; normal mood and affect   ASSESSMENT & PLAN:  1. Podagra   2. Acute gout of left foot, unspecified cause    Meds ordered this encounter  Medications  . methylPREDNISolone sodium succinate (SOLU-MEDROL) 125 mg/2 mL injection 125 mg  . predniSONE (DELTASONE) 20 MG tablet    Sig: Take 1 tablet (20 mg total) by mouth 2 (two) times daily with a meal for 5 days.    Dispense:  10 tablet    Refill:  0    Order Specific Question:   Supervising Provider    Answer:   Raylene Everts [id number]   Rest, elevate, and ice Steroid shot given in office Prescribed prednisone take as directed and to completion Follow up with PCP for further evaluation and management Return or go to the ED if you have any new or worsening symptoms fever, chills, nausea, vomiting, worsening symptoms despite medications, etc...   Reviewed expectations re: course of current medical issues. Questions answered. Outlined signs and symptoms indicating need for more acute intervention. Patient verbalized understanding. After Visit Summary given.    Lestine Box, PA-C 10/20/19 1106

## 2019-10-20 NOTE — Discharge Instructions (Signed)
Rest, elevate, and ice Steroid shot given in office Prescribed prednisone take as directed and to completion Follow up with PCP for further evaluation and management Return or go to the ED if you have any new or worsening symptoms fever, chills, nausea, vomiting, worsening symptoms despite medications, etc..Marland Kitchen

## 2019-10-29 ENCOUNTER — Telehealth: Payer: Self-pay | Admitting: Emergency Medicine

## 2019-10-29 MED ORDER — COLCHICINE 0.6 MG PO TABS: ORAL_TABLET | ORAL | 0 refills | Status: DC

## 2019-10-29 NOTE — Telephone Encounter (Signed)
Pt called and spoke to [employer] RN, requesting a different medication for gout flare.  Colchicine called into pharmacy on file.  Has fu appt with PCP this week.

## 2019-11-01 ENCOUNTER — Ambulatory Visit (HOSPITAL_COMMUNITY)
Admission: RE | Admit: 2019-11-01 | Discharge: 2019-11-01 | Disposition: A | Discharge status: Home / Self Care | Payer: PPO | Source: Ambulatory Visit | Attending: Family Medicine | Admitting: Family Medicine

## 2019-11-01 ENCOUNTER — Other Ambulatory Visit (HOSPITAL_COMMUNITY): Payer: Self-pay | Admitting: Family Medicine

## 2019-11-01 ENCOUNTER — Encounter (HOSPITAL_COMMUNITY): Payer: Self-pay

## 2019-11-01 ENCOUNTER — Other Ambulatory Visit: Payer: Self-pay

## 2019-11-01 DIAGNOSIS — M79672 Pain in left foot: Secondary | ICD-10-CM

## 2019-11-01 DIAGNOSIS — J449 Chronic obstructive pulmonary disease, unspecified: Secondary | ICD-10-CM | POA: Diagnosis not present

## 2019-11-01 DIAGNOSIS — R0609 Other forms of dyspnea: Secondary | ICD-10-CM | POA: Diagnosis not present

## 2019-11-01 DIAGNOSIS — M19072 Primary osteoarthritis, left ankle and foot: Secondary | ICD-10-CM | POA: Diagnosis not present

## 2019-11-01 DIAGNOSIS — M79675 Pain in left toe(s): Secondary | ICD-10-CM

## 2019-11-01 DIAGNOSIS — E039 Hypothyroidism, unspecified: Secondary | ICD-10-CM | POA: Diagnosis not present

## 2019-11-01 DIAGNOSIS — Z6829 Body mass index (BMI) 29.0-29.9, adult: Secondary | ICD-10-CM | POA: Diagnosis not present

## 2019-11-01 DIAGNOSIS — M109 Gout, unspecified: Secondary | ICD-10-CM | POA: Diagnosis not present

## 2019-11-22 ENCOUNTER — Ambulatory Visit: Payer: PPO

## 2019-11-23 DIAGNOSIS — R944 Abnormal results of kidney function studies: Secondary | ICD-10-CM | POA: Diagnosis not present

## 2019-11-23 DIAGNOSIS — M1712 Unilateral primary osteoarthritis, left knee: Secondary | ICD-10-CM | POA: Diagnosis not present

## 2019-11-23 DIAGNOSIS — Z96651 Presence of right artificial knee joint: Secondary | ICD-10-CM | POA: Diagnosis not present

## 2019-11-23 DIAGNOSIS — Z471 Aftercare following joint replacement surgery: Secondary | ICD-10-CM | POA: Diagnosis not present

## 2019-11-30 ENCOUNTER — Ambulatory Visit: Payer: PPO

## 2019-12-03 ENCOUNTER — Ambulatory Visit: Payer: PPO

## 2019-12-07 ENCOUNTER — Ambulatory Visit: Payer: PPO | Admitting: Podiatry

## 2019-12-07 ENCOUNTER — Encounter: Payer: Self-pay | Admitting: Podiatry

## 2019-12-07 ENCOUNTER — Other Ambulatory Visit: Payer: Self-pay

## 2019-12-07 VITALS — Temp 98.0°F

## 2019-12-07 DIAGNOSIS — M21619 Bunion of unspecified foot: Secondary | ICD-10-CM | POA: Diagnosis not present

## 2019-12-07 DIAGNOSIS — M779 Enthesopathy, unspecified: Secondary | ICD-10-CM | POA: Diagnosis not present

## 2019-12-07 NOTE — Progress Notes (Signed)
Subjective:   Patient ID: Mary Hart, female   DOB: 79 y.o.   MRN: [id number]   HPI Patient presents stating she is developed a lot of pain around her big toe joint left recently and she went to an urgent care put on a steroid pack and it only helped temporarily.  Patient does not smoke likes to be active and has had a bunion deformity for a while   Review of Systems  All other systems reviewed and are negative.       Objective:  Physical Exam Vitals and nursing note reviewed.  Constitutional:      Appearance: She is well-developed.  Pulmonary:     Effort: Pulmonary effort is normal.  Musculoskeletal:        General: Normal range of motion.  Skin:    General: Skin is warm.  Neurological:     Mental Status: She is alert.     Neurovascular status intact muscle strength adequate range of motion within normal limits with patient found to have prominent bunion deformity left with irritation around the joint mild fluid accumulation that is painful when pressed     Assessment:  Inflammatory capsulitis first MPJ left with bunion deformity     Plan:  H&P reviewed condition and went ahead did sterile prep injected around the bunion 3 mg Dexasone Kenalog 5 mm Xylocaine discussed the possibility for bunion correction and educated her on bunion deformity.  Reappoint to recheck in 4 weeks with decision about surgery to be made at that time

## 2019-12-26 DIAGNOSIS — D485 Neoplasm of uncertain behavior of skin: Secondary | ICD-10-CM | POA: Diagnosis not present

## 2019-12-26 DIAGNOSIS — Z1283 Encounter for screening for malignant neoplasm of skin: Secondary | ICD-10-CM | POA: Diagnosis not present

## 2019-12-26 DIAGNOSIS — D2272 Melanocytic nevi of left lower limb, including hip: Secondary | ICD-10-CM | POA: Diagnosis not present

## 2019-12-26 DIAGNOSIS — D225 Melanocytic nevi of trunk: Secondary | ICD-10-CM | POA: Diagnosis not present

## 2020-01-04 ENCOUNTER — Ambulatory Visit: Payer: PPO | Admitting: Podiatry

## 2020-01-07 ENCOUNTER — Other Ambulatory Visit: Payer: Self-pay

## 2020-01-07 ENCOUNTER — Ambulatory Visit: Payer: PPO | Admitting: Podiatrist

## 2020-01-07 ENCOUNTER — Encounter: Payer: Self-pay | Admitting: Podiatrist

## 2020-01-07 VITALS — Temp 97.9°F

## 2020-01-07 DIAGNOSIS — M778 Other enthesopathies, not elsewhere classified: Secondary | ICD-10-CM

## 2020-01-07 DIAGNOSIS — M779 Enthesopathy, unspecified: Secondary | ICD-10-CM

## 2020-01-07 DIAGNOSIS — M21619 Bunion of unspecified foot: Secondary | ICD-10-CM

## 2020-01-07 NOTE — Patient Instructions (Signed)
We can call in a cream to help with the pain-  It is pain cream from [employer]  Number [phone]

## 2020-01-08 ENCOUNTER — Encounter: Payer: Self-pay | Admitting: Podiatrist

## 2020-01-08 NOTE — Progress Notes (Signed)
Chief Complaint  Patient presents with  . Capsulitis    Follow-up; Left foot; Hallux-bottom of toe & medial side-below bunion; pt stated, "Pain is on and off; injection helped last time; not interested in surgery; foot hurts today; wants another injection today if you can do that"     HPI: Patient is 79 y.o. female who presents today for follow up of bunion pain left foot.  Patient states the injection helped last time she was seen by Dr. Paulla Dolly.  She is not interested in surgery.     Allergies  Allergen Reactions  . Codeine Nausea And Vomiting  . Hydrocodone   . Hydromorphone Hcl Itching  . Morphine Nausea And Vomiting  . Moxifloxacin     N/V/D, skin red. Tolerates Levaquin and Cipro.   . Sulfonamide Derivatives Other (See Comments)    "almost died" "pulse went down to 12"    Review of systems is reviewed and negative.   Physical Exam  Patient is awake, alert, and oriented x 3.  In no acute distress.    Vascular status is intact with palpable pedal pulses DP and PT bilateral and capillary refill time less than 3 seconds bilateral.  No edema or erythema noted.  Neurological exam reveals epicritic and protective sensation grossly intact bilateral.  Dermatological exam reveals skin is supple and dry to bilateral feet.  No open lesions present.   Musculoskeletal exam: Musculature intact with dorsiflexion, plantarflexion, inversion, eversion. Left first MPJ is enlarged and painful with palpation- notable bunion present.     Assessment: HAV with capsulitis left first  Plan: Agreed to a second injection proximal to the medial eminence of the bunion deformity.  The skin was prepped with betadine and a mix of 68ml dexamethasone phospate 4mg /ml and 69ml of xylocaine plain was infiltrated without complication.  The patient will call if this fails to reduce her symptoms.  I also suggested a pain cream from [facility] *([phone]) if she is interested she will call.

## 2020-01-23 DIAGNOSIS — E039 Hypothyroidism, unspecified: Secondary | ICD-10-CM | POA: Diagnosis not present

## 2020-01-23 DIAGNOSIS — Z7722 Contact with and (suspected) exposure to environmental tobacco smoke (acute) (chronic): Secondary | ICD-10-CM | POA: Diagnosis not present

## 2020-01-23 DIAGNOSIS — G8929 Other chronic pain: Secondary | ICD-10-CM | POA: Diagnosis not present

## 2020-01-23 DIAGNOSIS — J449 Chronic obstructive pulmonary disease, unspecified: Secondary | ICD-10-CM | POA: Diagnosis not present

## 2020-02-04 DIAGNOSIS — Z13220 Encounter for screening for lipoid disorders: Secondary | ICD-10-CM | POA: Diagnosis not present

## 2020-02-04 DIAGNOSIS — E538 Deficiency of other specified B group vitamins: Secondary | ICD-10-CM | POA: Diagnosis not present

## 2020-02-04 DIAGNOSIS — Z0001 Encounter for general adult medical examination with abnormal findings: Secondary | ICD-10-CM | POA: Diagnosis not present

## 2020-02-04 DIAGNOSIS — J449 Chronic obstructive pulmonary disease, unspecified: Secondary | ICD-10-CM | POA: Diagnosis not present

## 2020-02-04 DIAGNOSIS — Z6834 Body mass index (BMI) 34.0-34.9, adult: Secondary | ICD-10-CM | POA: Diagnosis not present

## 2020-02-04 DIAGNOSIS — E039 Hypothyroidism, unspecified: Secondary | ICD-10-CM | POA: Diagnosis not present

## 2020-02-04 DIAGNOSIS — E559 Vitamin D deficiency, unspecified: Secondary | ICD-10-CM | POA: Diagnosis not present

## 2020-02-04 DIAGNOSIS — Z1389 Encounter for screening for other disorder: Secondary | ICD-10-CM | POA: Diagnosis not present

## 2020-02-26 DIAGNOSIS — Z1231 Encounter for screening mammogram for malignant neoplasm of breast: Secondary | ICD-10-CM | POA: Diagnosis not present

## 2020-03-03 DIAGNOSIS — L900 Lichen sclerosus et atrophicus: Secondary | ICD-10-CM | POA: Diagnosis not present

## 2020-03-03 DIAGNOSIS — D2272 Melanocytic nevi of left lower limb, including hip: Secondary | ICD-10-CM | POA: Diagnosis not present

## 2020-03-03 DIAGNOSIS — L280 Lichen simplex chronicus: Secondary | ICD-10-CM | POA: Diagnosis not present

## 2020-03-19 DIAGNOSIS — R5383 Other fatigue: Secondary | ICD-10-CM | POA: Diagnosis not present

## 2020-03-19 DIAGNOSIS — E663 Overweight: Secondary | ICD-10-CM | POA: Diagnosis not present

## 2020-03-19 DIAGNOSIS — J302 Other seasonal allergic rhinitis: Secondary | ICD-10-CM | POA: Diagnosis not present

## 2020-03-19 DIAGNOSIS — E538 Deficiency of other specified B group vitamins: Secondary | ICD-10-CM | POA: Diagnosis not present

## 2020-03-19 DIAGNOSIS — Z6827 Body mass index (BMI) 27.0-27.9, adult: Secondary | ICD-10-CM | POA: Diagnosis not present

## 2020-03-24 DIAGNOSIS — G8929 Other chronic pain: Secondary | ICD-10-CM | POA: Diagnosis not present

## 2020-03-24 DIAGNOSIS — J449 Chronic obstructive pulmonary disease, unspecified: Secondary | ICD-10-CM | POA: Diagnosis not present

## 2020-03-24 DIAGNOSIS — Z720 Tobacco use: Secondary | ICD-10-CM | POA: Diagnosis not present

## 2020-03-24 DIAGNOSIS — E039 Hypothyroidism, unspecified: Secondary | ICD-10-CM | POA: Diagnosis not present

## 2020-03-31 DIAGNOSIS — Z961 Presence of intraocular lens: Secondary | ICD-10-CM | POA: Diagnosis not present

## 2020-03-31 DIAGNOSIS — H353131 Nonexudative age-related macular degeneration, bilateral, early dry stage: Secondary | ICD-10-CM | POA: Diagnosis not present

## 2020-03-31 DIAGNOSIS — H524 Presbyopia: Secondary | ICD-10-CM | POA: Diagnosis not present

## 2020-04-15 DIAGNOSIS — L900 Lichen sclerosus et atrophicus: Secondary | ICD-10-CM | POA: Diagnosis not present

## 2020-04-17 DIAGNOSIS — Z6826 Body mass index (BMI) 26.0-26.9, adult: Secondary | ICD-10-CM | POA: Diagnosis not present

## 2020-04-17 DIAGNOSIS — M8580 Other specified disorders of bone density and structure, unspecified site: Secondary | ICD-10-CM | POA: Diagnosis not present

## 2020-04-17 DIAGNOSIS — L900 Lichen sclerosus et atrophicus: Secondary | ICD-10-CM | POA: Diagnosis not present

## 2020-04-17 DIAGNOSIS — J449 Chronic obstructive pulmonary disease, unspecified: Secondary | ICD-10-CM | POA: Diagnosis not present

## 2020-04-17 DIAGNOSIS — K219 Gastro-esophageal reflux disease without esophagitis: Secondary | ICD-10-CM | POA: Diagnosis not present

## 2020-04-22 DIAGNOSIS — S338XXA Sprain of other parts of lumbar spine and pelvis, initial encounter: Secondary | ICD-10-CM | POA: Diagnosis not present

## 2020-04-22 DIAGNOSIS — M47816 Spondylosis without myelopathy or radiculopathy, lumbar region: Secondary | ICD-10-CM | POA: Diagnosis not present

## 2020-04-22 DIAGNOSIS — M9903 Segmental and somatic dysfunction of lumbar region: Secondary | ICD-10-CM | POA: Diagnosis not present

## 2020-04-24 DIAGNOSIS — M47816 Spondylosis without myelopathy or radiculopathy, lumbar region: Secondary | ICD-10-CM | POA: Diagnosis not present

## 2020-04-24 DIAGNOSIS — M9903 Segmental and somatic dysfunction of lumbar region: Secondary | ICD-10-CM | POA: Diagnosis not present

## 2020-04-24 DIAGNOSIS — S338XXA Sprain of other parts of lumbar spine and pelvis, initial encounter: Secondary | ICD-10-CM | POA: Diagnosis not present

## 2020-05-23 DIAGNOSIS — E039 Hypothyroidism, unspecified: Secondary | ICD-10-CM | POA: Diagnosis not present

## 2020-05-23 DIAGNOSIS — J449 Chronic obstructive pulmonary disease, unspecified: Secondary | ICD-10-CM | POA: Diagnosis not present

## 2020-05-23 DIAGNOSIS — G8929 Other chronic pain: Secondary | ICD-10-CM | POA: Diagnosis not present

## 2020-05-23 DIAGNOSIS — Z7722 Contact with and (suspected) exposure to environmental tobacco smoke (acute) (chronic): Secondary | ICD-10-CM | POA: Diagnosis not present

## 2020-06-24 DIAGNOSIS — J019 Acute sinusitis, unspecified: Secondary | ICD-10-CM | POA: Diagnosis not present

## 2020-06-24 DIAGNOSIS — E039 Hypothyroidism, unspecified: Secondary | ICD-10-CM | POA: Diagnosis not present

## 2020-06-24 DIAGNOSIS — Z7722 Contact with and (suspected) exposure to environmental tobacco smoke (acute) (chronic): Secondary | ICD-10-CM | POA: Diagnosis not present

## 2020-06-24 DIAGNOSIS — G8929 Other chronic pain: Secondary | ICD-10-CM | POA: Diagnosis not present

## 2020-06-24 DIAGNOSIS — J449 Chronic obstructive pulmonary disease, unspecified: Secondary | ICD-10-CM | POA: Diagnosis not present

## 2020-07-24 ENCOUNTER — Ambulatory Visit: Payer: PPO | Attending: Internal Medicine

## 2020-07-24 DIAGNOSIS — Z23 Encounter for immunization: Secondary | ICD-10-CM

## 2020-07-24 NOTE — Progress Notes (Signed)
   Covid-19 Vaccination Clinic  Name:  TOYIA JELINEK    MRN: [id number] DOB: [birth year]  07/24/2020  Ms. Cupples was observed post Covid-19 immunization for 15 minutes without incident. She was provided with Vaccine Information Sheet and instruction to access the V-Safe system.   Ms. Pant was instructed to call [phone] with any severe reactions post vaccine: Marland Kitchen Difficulty breathing  . Swelling of face and throat  . A fast heartbeat  . A bad rash all over body  . Dizziness and weakness

## 2020-08-01 DIAGNOSIS — Z23 Encounter for immunization: Secondary | ICD-10-CM | POA: Diagnosis not present

## 2020-08-04 DIAGNOSIS — Z6826 Body mass index (BMI) 26.0-26.9, adult: Secondary | ICD-10-CM | POA: Diagnosis not present

## 2020-08-04 DIAGNOSIS — B359 Dermatophytosis, unspecified: Secondary | ICD-10-CM | POA: Diagnosis not present

## 2020-08-04 DIAGNOSIS — L989 Disorder of the skin and subcutaneous tissue, unspecified: Secondary | ICD-10-CM | POA: Diagnosis not present

## 2020-08-04 DIAGNOSIS — R82998 Other abnormal findings in urine: Secondary | ICD-10-CM | POA: Diagnosis not present

## 2020-08-05 DIAGNOSIS — B019 Varicella without complication: Secondary | ICD-10-CM | POA: Diagnosis not present

## 2020-08-13 DIAGNOSIS — M13841 Other specified arthritis, right hand: Secondary | ICD-10-CM | POA: Diagnosis not present

## 2020-08-13 DIAGNOSIS — M79645 Pain in left finger(s): Secondary | ICD-10-CM | POA: Diagnosis not present

## 2020-08-13 DIAGNOSIS — M79644 Pain in right finger(s): Secondary | ICD-10-CM | POA: Diagnosis not present

## 2020-09-23 DIAGNOSIS — G8929 Other chronic pain: Secondary | ICD-10-CM | POA: Diagnosis not present

## 2020-09-23 DIAGNOSIS — E039 Hypothyroidism, unspecified: Secondary | ICD-10-CM | POA: Diagnosis not present

## 2020-09-23 DIAGNOSIS — F4542 Pain disorder with related psychological factors: Secondary | ICD-10-CM | POA: Diagnosis not present

## 2020-09-23 DIAGNOSIS — Z7722 Contact with and (suspected) exposure to environmental tobacco smoke (acute) (chronic): Secondary | ICD-10-CM | POA: Diagnosis not present

## 2020-09-25 DIAGNOSIS — Z6827 Body mass index (BMI) 27.0-27.9, adult: Secondary | ICD-10-CM | POA: Diagnosis not present

## 2020-09-25 DIAGNOSIS — E663 Overweight: Secondary | ICD-10-CM | POA: Diagnosis not present

## 2020-09-25 DIAGNOSIS — J441 Chronic obstructive pulmonary disease with (acute) exacerbation: Secondary | ICD-10-CM | POA: Diagnosis not present

## 2020-10-28 DIAGNOSIS — Z1331 Encounter for screening for depression: Secondary | ICD-10-CM | POA: Diagnosis not present

## 2020-10-28 DIAGNOSIS — Z0001 Encounter for general adult medical examination with abnormal findings: Secondary | ICD-10-CM | POA: Diagnosis not present

## 2020-10-28 DIAGNOSIS — Z6826 Body mass index (BMI) 26.0-26.9, adult: Secondary | ICD-10-CM | POA: Diagnosis not present

## 2020-10-28 DIAGNOSIS — R7309 Other abnormal glucose: Secondary | ICD-10-CM | POA: Diagnosis not present

## 2020-10-28 DIAGNOSIS — Z1389 Encounter for screening for other disorder: Secondary | ICD-10-CM | POA: Diagnosis not present

## 2020-10-28 DIAGNOSIS — J449 Chronic obstructive pulmonary disease, unspecified: Secondary | ICD-10-CM | POA: Diagnosis not present

## 2020-10-28 DIAGNOSIS — Z79899 Other long term (current) drug therapy: Secondary | ICD-10-CM | POA: Diagnosis not present

## 2020-11-13 DIAGNOSIS — F17210 Nicotine dependence, cigarettes, uncomplicated: Secondary | ICD-10-CM | POA: Diagnosis not present

## 2020-11-13 DIAGNOSIS — Z87891 Personal history of nicotine dependence: Secondary | ICD-10-CM | POA: Diagnosis not present

## 2020-11-22 DIAGNOSIS — J449 Chronic obstructive pulmonary disease, unspecified: Secondary | ICD-10-CM | POA: Diagnosis not present

## 2020-11-22 DIAGNOSIS — G8929 Other chronic pain: Secondary | ICD-10-CM | POA: Diagnosis not present

## 2020-11-22 DIAGNOSIS — E039 Hypothyroidism, unspecified: Secondary | ICD-10-CM | POA: Diagnosis not present

## 2020-11-22 DIAGNOSIS — Z7722 Contact with and (suspected) exposure to environmental tobacco smoke (acute) (chronic): Secondary | ICD-10-CM | POA: Diagnosis not present

## 2020-12-18 DIAGNOSIS — M47816 Spondylosis without myelopathy or radiculopathy, lumbar region: Secondary | ICD-10-CM | POA: Diagnosis not present

## 2020-12-18 DIAGNOSIS — S338XXA Sprain of other parts of lumbar spine and pelvis, initial encounter: Secondary | ICD-10-CM | POA: Diagnosis not present

## 2020-12-18 DIAGNOSIS — M9903 Segmental and somatic dysfunction of lumbar region: Secondary | ICD-10-CM | POA: Diagnosis not present

## 2020-12-19 DIAGNOSIS — S338XXA Sprain of other parts of lumbar spine and pelvis, initial encounter: Secondary | ICD-10-CM | POA: Diagnosis not present

## 2020-12-19 DIAGNOSIS — M47816 Spondylosis without myelopathy or radiculopathy, lumbar region: Secondary | ICD-10-CM | POA: Diagnosis not present

## 2020-12-19 DIAGNOSIS — M9903 Segmental and somatic dysfunction of lumbar region: Secondary | ICD-10-CM | POA: Diagnosis not present

## 2020-12-23 DIAGNOSIS — S338XXA Sprain of other parts of lumbar spine and pelvis, initial encounter: Secondary | ICD-10-CM | POA: Diagnosis not present

## 2020-12-23 DIAGNOSIS — M47816 Spondylosis without myelopathy or radiculopathy, lumbar region: Secondary | ICD-10-CM | POA: Diagnosis not present

## 2020-12-23 DIAGNOSIS — M9903 Segmental and somatic dysfunction of lumbar region: Secondary | ICD-10-CM | POA: Diagnosis not present

## 2020-12-25 DIAGNOSIS — M47816 Spondylosis without myelopathy or radiculopathy, lumbar region: Secondary | ICD-10-CM | POA: Diagnosis not present

## 2020-12-25 DIAGNOSIS — M9903 Segmental and somatic dysfunction of lumbar region: Secondary | ICD-10-CM | POA: Diagnosis not present

## 2020-12-25 DIAGNOSIS — S338XXA Sprain of other parts of lumbar spine and pelvis, initial encounter: Secondary | ICD-10-CM | POA: Diagnosis not present

## 2020-12-31 DIAGNOSIS — M9903 Segmental and somatic dysfunction of lumbar region: Secondary | ICD-10-CM | POA: Diagnosis not present

## 2020-12-31 DIAGNOSIS — M47816 Spondylosis without myelopathy or radiculopathy, lumbar region: Secondary | ICD-10-CM | POA: Diagnosis not present

## 2020-12-31 DIAGNOSIS — S338XXA Sprain of other parts of lumbar spine and pelvis, initial encounter: Secondary | ICD-10-CM | POA: Diagnosis not present

## 2021-01-05 DIAGNOSIS — M9903 Segmental and somatic dysfunction of lumbar region: Secondary | ICD-10-CM | POA: Diagnosis not present

## 2021-01-05 DIAGNOSIS — S338XXA Sprain of other parts of lumbar spine and pelvis, initial encounter: Secondary | ICD-10-CM | POA: Diagnosis not present

## 2021-01-05 DIAGNOSIS — M47816 Spondylosis without myelopathy or radiculopathy, lumbar region: Secondary | ICD-10-CM | POA: Diagnosis not present

## 2021-01-08 DIAGNOSIS — R519 Headache, unspecified: Secondary | ICD-10-CM | POA: Diagnosis not present

## 2021-01-08 DIAGNOSIS — M9901 Segmental and somatic dysfunction of cervical region: Secondary | ICD-10-CM | POA: Diagnosis not present

## 2021-01-08 DIAGNOSIS — S338XXA Sprain of other parts of lumbar spine and pelvis, initial encounter: Secondary | ICD-10-CM | POA: Diagnosis not present

## 2021-01-08 DIAGNOSIS — M9902 Segmental and somatic dysfunction of thoracic region: Secondary | ICD-10-CM | POA: Diagnosis not present

## 2021-01-08 DIAGNOSIS — M47816 Spondylosis without myelopathy or radiculopathy, lumbar region: Secondary | ICD-10-CM | POA: Diagnosis not present

## 2021-01-08 DIAGNOSIS — M9903 Segmental and somatic dysfunction of lumbar region: Secondary | ICD-10-CM | POA: Diagnosis not present

## 2021-01-08 DIAGNOSIS — M47812 Spondylosis without myelopathy or radiculopathy, cervical region: Secondary | ICD-10-CM | POA: Diagnosis not present

## 2021-01-12 DIAGNOSIS — M47812 Spondylosis without myelopathy or radiculopathy, cervical region: Secondary | ICD-10-CM | POA: Diagnosis not present

## 2021-01-12 DIAGNOSIS — M9903 Segmental and somatic dysfunction of lumbar region: Secondary | ICD-10-CM | POA: Diagnosis not present

## 2021-01-12 DIAGNOSIS — M47816 Spondylosis without myelopathy or radiculopathy, lumbar region: Secondary | ICD-10-CM | POA: Diagnosis not present

## 2021-01-12 DIAGNOSIS — S338XXA Sprain of other parts of lumbar spine and pelvis, initial encounter: Secondary | ICD-10-CM | POA: Diagnosis not present

## 2021-01-12 DIAGNOSIS — R519 Headache, unspecified: Secondary | ICD-10-CM | POA: Diagnosis not present

## 2021-01-12 DIAGNOSIS — M9902 Segmental and somatic dysfunction of thoracic region: Secondary | ICD-10-CM | POA: Diagnosis not present

## 2021-01-12 DIAGNOSIS — M9901 Segmental and somatic dysfunction of cervical region: Secondary | ICD-10-CM | POA: Diagnosis not present

## 2021-01-21 DIAGNOSIS — J449 Chronic obstructive pulmonary disease, unspecified: Secondary | ICD-10-CM | POA: Diagnosis not present

## 2021-01-21 DIAGNOSIS — E039 Hypothyroidism, unspecified: Secondary | ICD-10-CM | POA: Diagnosis not present

## 2021-01-26 DIAGNOSIS — M47816 Spondylosis without myelopathy or radiculopathy, lumbar region: Secondary | ICD-10-CM | POA: Diagnosis not present

## 2021-01-26 DIAGNOSIS — M47812 Spondylosis without myelopathy or radiculopathy, cervical region: Secondary | ICD-10-CM | POA: Diagnosis not present

## 2021-01-26 DIAGNOSIS — M9901 Segmental and somatic dysfunction of cervical region: Secondary | ICD-10-CM | POA: Diagnosis not present

## 2021-01-26 DIAGNOSIS — S338XXA Sprain of other parts of lumbar spine and pelvis, initial encounter: Secondary | ICD-10-CM | POA: Diagnosis not present

## 2021-01-26 DIAGNOSIS — M9902 Segmental and somatic dysfunction of thoracic region: Secondary | ICD-10-CM | POA: Diagnosis not present

## 2021-01-26 DIAGNOSIS — M9903 Segmental and somatic dysfunction of lumbar region: Secondary | ICD-10-CM | POA: Diagnosis not present

## 2021-01-26 DIAGNOSIS — R519 Headache, unspecified: Secondary | ICD-10-CM | POA: Diagnosis not present

## 2021-02-09 DIAGNOSIS — M9902 Segmental and somatic dysfunction of thoracic region: Secondary | ICD-10-CM | POA: Diagnosis not present

## 2021-02-09 DIAGNOSIS — M9903 Segmental and somatic dysfunction of lumbar region: Secondary | ICD-10-CM | POA: Diagnosis not present

## 2021-02-09 DIAGNOSIS — M9901 Segmental and somatic dysfunction of cervical region: Secondary | ICD-10-CM | POA: Diagnosis not present

## 2021-02-09 DIAGNOSIS — M47816 Spondylosis without myelopathy or radiculopathy, lumbar region: Secondary | ICD-10-CM | POA: Diagnosis not present

## 2021-02-09 DIAGNOSIS — M47812 Spondylosis without myelopathy or radiculopathy, cervical region: Secondary | ICD-10-CM | POA: Diagnosis not present

## 2021-02-09 DIAGNOSIS — S338XXA Sprain of other parts of lumbar spine and pelvis, initial encounter: Secondary | ICD-10-CM | POA: Diagnosis not present

## 2021-02-09 DIAGNOSIS — R519 Headache, unspecified: Secondary | ICD-10-CM | POA: Diagnosis not present

## 2021-02-16 DIAGNOSIS — R519 Headache, unspecified: Secondary | ICD-10-CM | POA: Diagnosis not present

## 2021-02-16 DIAGNOSIS — S338XXA Sprain of other parts of lumbar spine and pelvis, initial encounter: Secondary | ICD-10-CM | POA: Diagnosis not present

## 2021-02-16 DIAGNOSIS — M47816 Spondylosis without myelopathy or radiculopathy, lumbar region: Secondary | ICD-10-CM | POA: Diagnosis not present

## 2021-02-16 DIAGNOSIS — M47812 Spondylosis without myelopathy or radiculopathy, cervical region: Secondary | ICD-10-CM | POA: Diagnosis not present

## 2021-02-16 DIAGNOSIS — M9901 Segmental and somatic dysfunction of cervical region: Secondary | ICD-10-CM | POA: Diagnosis not present

## 2021-02-16 DIAGNOSIS — M9903 Segmental and somatic dysfunction of lumbar region: Secondary | ICD-10-CM | POA: Diagnosis not present

## 2021-02-16 DIAGNOSIS — M9902 Segmental and somatic dysfunction of thoracic region: Secondary | ICD-10-CM | POA: Diagnosis not present

## 2021-02-18 DIAGNOSIS — J343 Hypertrophy of nasal turbinates: Secondary | ICD-10-CM | POA: Diagnosis not present

## 2021-02-18 DIAGNOSIS — J342 Deviated nasal septum: Secondary | ICD-10-CM | POA: Diagnosis not present

## 2021-02-18 DIAGNOSIS — J310 Chronic rhinitis: Secondary | ICD-10-CM | POA: Diagnosis not present

## 2021-02-21 DIAGNOSIS — J449 Chronic obstructive pulmonary disease, unspecified: Secondary | ICD-10-CM | POA: Diagnosis not present

## 2021-02-21 DIAGNOSIS — E039 Hypothyroidism, unspecified: Secondary | ICD-10-CM | POA: Diagnosis not present

## 2021-03-02 DIAGNOSIS — M9902 Segmental and somatic dysfunction of thoracic region: Secondary | ICD-10-CM | POA: Diagnosis not present

## 2021-03-02 DIAGNOSIS — M47812 Spondylosis without myelopathy or radiculopathy, cervical region: Secondary | ICD-10-CM | POA: Diagnosis not present

## 2021-03-02 DIAGNOSIS — M9903 Segmental and somatic dysfunction of lumbar region: Secondary | ICD-10-CM | POA: Diagnosis not present

## 2021-03-02 DIAGNOSIS — M9901 Segmental and somatic dysfunction of cervical region: Secondary | ICD-10-CM | POA: Diagnosis not present

## 2021-03-02 DIAGNOSIS — M47816 Spondylosis without myelopathy or radiculopathy, lumbar region: Secondary | ICD-10-CM | POA: Diagnosis not present

## 2021-03-02 DIAGNOSIS — R519 Headache, unspecified: Secondary | ICD-10-CM | POA: Diagnosis not present

## 2021-03-02 DIAGNOSIS — S338XXA Sprain of other parts of lumbar spine and pelvis, initial encounter: Secondary | ICD-10-CM | POA: Diagnosis not present

## 2021-03-11 DIAGNOSIS — M5412 Radiculopathy, cervical region: Secondary | ICD-10-CM | POA: Diagnosis not present

## 2021-03-11 DIAGNOSIS — F329 Major depressive disorder, single episode, unspecified: Secondary | ICD-10-CM | POA: Diagnosis not present

## 2021-03-11 DIAGNOSIS — M5030 Other cervical disc degeneration, unspecified cervical region: Secondary | ICD-10-CM | POA: Diagnosis not present

## 2021-03-11 DIAGNOSIS — Z6828 Body mass index (BMI) 28.0-28.9, adult: Secondary | ICD-10-CM | POA: Diagnosis not present

## 2021-03-11 DIAGNOSIS — E663 Overweight: Secondary | ICD-10-CM | POA: Diagnosis not present

## 2021-03-16 ENCOUNTER — Other Ambulatory Visit: Payer: Self-pay

## 2021-03-16 ENCOUNTER — Ambulatory Visit
Admission: RE | Admit: 2021-03-16 | Discharge: 2021-03-16 | Disposition: A | Discharge status: Home / Self Care | Payer: PPO | Source: Ambulatory Visit | Attending: Family Medicine | Admitting: Family Medicine

## 2021-03-16 VITALS — BP 161/77 | HR 90 | Temp 98.1°F | Resp 16

## 2021-03-16 DIAGNOSIS — R0602 Shortness of breath: Secondary | ICD-10-CM

## 2021-03-16 DIAGNOSIS — B349 Viral infection, unspecified: Secondary | ICD-10-CM | POA: Diagnosis not present

## 2021-03-16 DIAGNOSIS — R509 Fever, unspecified: Secondary | ICD-10-CM

## 2021-03-16 DIAGNOSIS — R519 Headache, unspecified: Secondary | ICD-10-CM

## 2021-03-16 DIAGNOSIS — Z1152 Encounter for screening for COVID-19: Secondary | ICD-10-CM | POA: Diagnosis not present

## 2021-03-16 MED ORDER — BENZONATATE 100 MG PO CAPS: 100.0000 mg | ORAL_CAPSULE | Freq: Three times a day (TID) | ORAL | 0 refills | Status: DC

## 2021-03-16 MED ORDER — MOLNUPIRAVIR EUA 200MG CAPSULE: 4.0000 | ORAL_CAPSULE | Freq: Two times a day (BID) | ORAL | 0 refills | Status: DC

## 2021-03-16 MED ORDER — MOLNUPIRAVIR EUA 200MG CAPSULE
4.0000 | ORAL_CAPSULE | Freq: Two times a day (BID) | ORAL | 0 refills | Status: AC
Start: 2021-03-16 — End: 2021-03-21

## 2021-03-16 MED ORDER — PREDNISONE 10 MG (21) PO TBPK: ORAL_TABLET | Freq: Every day | ORAL | 0 refills | Status: AC

## 2021-03-16 NOTE — ED Provider Notes (Signed)
[facility]    CSN: [id number] Arrival date & time: 03/16/21  1743      History   Chief Complaint Chief Complaint  Patient presents with  . Headache    HPI Mary Hart is a 80 y.o. female.   Reports chills, body aches, headache, fever, shortness of breath since this morning.  Reports COVID-positive vaccine about 3 days ago.  Has negative history of COVID.  Has not attempted OTC treatment.  Has completed COVID vaccines as booster.  Has completed flu vaccine, as well as shingles vaccine within the last 5 months.  There are not alleviating or aggravating factors.  Denies abdominal pain, nausea, vomiting, diarrhea, rash, fever, other symptoms.  Does have medical history significant for COPD.  ROS per HPI   The history is provided by the patient.  Headache   Past Medical History:  Diagnosis Date  . Anxiety   . Arthritis    oa  . Clostridium difficile infection 2013, 2015   Treated with vancomycin  . COPD (chronic obstructive pulmonary disease) ([place])   . Depression   . Diverticula of colon   . Genital herpes   . GERD (gastroesophageal reflux disease)   . Hypothyroid   . IBS (irritable bowel syndrome)   . Internal hemorrhoids   . Jaundice    as child cause unknown  . Neuropathy    fingers both hands  . PONV (postoperative nausea and vomiting)    did well with last neck fusion   . Recurrent colitis due to Clostridium difficile 07/03/2013   4 treatments with vancomycin since late 2013/early 2014 - responds then relapses    . Vocal cord dysfunction    SYASMONIC DYSPHONIA    Patient Active Problem List   Diagnosis Date Noted  . Pelvic floor weakness in female 04/14/2017  . OA (osteoarthritis) of knee 12/27/2016  . 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 Dysphonia 10/15/2016  . Laryngospasms 10/15/2016  . Leg weakness 02/12/2014  . Stiffness of joint, not elsewhere classified, lower leg 02/12/2014  . Difficulty in walking(719.7)  02/12/2014  . Irritable bowel syndrome 05/04/2012  . GERD (gastroesophageal reflux disease) 02/14/2012    Past Surgical History:  Procedure Laterality Date  . ABDOMINAL HYSTERECTOMY    . Anterior Cervical Fusion X 2    . CHOLECYSTECTOMY    . COLONOSCOPY  06/20/2008   [place] Dr. Wynelle Bourgeois external hemorrhoids  . COLONOSCOPY  10/12/2012   Dr. Gala Romney- internal hemorrhoids, very early, few, sigmoid diverticula o/w normal appearing colon and terminal ileum. + cdiff on stool samples taken. Segmental bx negative.  . ESOPHAGOGASTRODUODENOSCOPY  11/25/10   [place]- tubular esophagus s/p of 56 dilator from a small HH/otherwise normal  . EYE SURGERY Bilateral 2012   ioc with lens replacement for cataracts  . JOINT REPLACEMENT  09/2010   Thumb left  . meniscus repair right knee  2014  . TOTAL KNEE ARTHROPLASTY Right 12/27/2016   Procedure: RIGHT TOTAL KNEE ARTHROPLASTY;  Surgeon: Gaynelle Arabian, MD;  Location: [facility];  Service: Orthopedics;  Laterality: Right;  Adductor Block  . VESICOVAGINAL FISTULA CLOSURE W/ TAH      OB History    Gravida  1   Para  1   Term  1   Preterm      AB      Living        SAB      IAB      Ectopic      Multiple  Live Births               Home Medications    Prior to Admission medications   Medication Sig Start Date End Date Taking? Authorizing Provider  benzonatate (TESSALON) 100 MG capsule Take 1 capsule (100 mg total) by mouth every 8 (eight) hours. 03/16/21  Yes Faustino Congress, NP  predniSONE (STERAPRED UNI-PAK 21 TAB) 10 MG (21) TBPK tablet Take by mouth daily for 6 days. Take 6 tablets on day 1, 5 tablets on day 2, 4 tablets on day 3, 3 tablets on day 4, 2 tablets on day 5, 1 tablet on day 6 03/16/21 03/22/21 Yes Faustino Congress, NP  ALPRAZolam Duanne Moron) 0.25 MG tablet Take 1 tablet by mouth as needed for anxiety. 11/08/18   Esterwood, Amy S, PA-C  Aspirin-Salicylamide-Caffeine (BC HEADACHE POWDER PO) Take 1 packet by mouth. As needed     [provider]  cetirizine (ZYRTEC) 10 MG tablet Take 10 mg by mouth at bedtime.    [provider]  gabapentin (NEURONTIN) 100 MG capsule  11/23/19   [provider]  levothyroxine (SYNTHROID) 75 MCG tablet Take 75 mcg by mouth daily before breakfast.    [provider]  linaclotide (LINZESS) 72 MCG capsule Take 1 tablet by mouth every morning. 11/08/18   Esterwood, Amy S, PA-C  molnupiravir EUA 200 mg CAPS Take 4 capsules (800 mg total) by mouth 2 (two) times daily for 5 days. 03/16/21 03/21/21  Faustino Congress, NP  ondansetron (ZOFRAN) 4 MG tablet Take 1 tablet (4 mg total) by mouth every 8 (eight) hours as needed for nausea or vomiting. 11/08/18   Esterwood, Amy S, PA-C    Family History Family History  Problem Relation Age of Onset  . Dementia Father   . Dementia Mother   . Breast cancer Mother   . Colon cancer Maternal Uncle   . Allergic rhinitis Neg Hx   . Asthma Neg Hx   . Eczema Neg Hx   . Urticaria Neg Hx     Social History Social History   Tobacco Use  . Smoking status: Former Smoker    Packs/day: 0.66    Years: 50.00    Pack years: 33.00    Types: Cigarettes  . Smokeless tobacco: Never Used  . Tobacco comment: 1/2 pack per day  Vaping Use  . Vaping Use: Former  Substance Use Topics  . Alcohol use: No  . Drug use: No     Allergies   Codeine, Hydrocodone, Hydromorphone hcl, Morphine, Moxifloxacin, and Sulfonamide derivatives   Review of Systems Review of Systems  Neurological: Positive for headaches.     Physical Exam Triage Vital Signs ED Triage Vitals  Enc Vitals Group     BP 03/16/21 1800 (!) 161/77     Pulse Rate 03/16/21 1800 90     Resp 03/16/21 1800 16     Temp 03/16/21 1800 98.1 F (36.7 C)     Temp Source 03/16/21 1800 Oral     SpO2 03/16/21 1800 97 %     Weight --      Height --      Head Circumference --      Peak Flow --      Pain Score 03/16/21 1801 5     Pain Loc --      Pain Edu? --       Excl. in [place]? --    No data found.  Updated Vital Signs BP (!) 161/77 (BP  Location: Right Arm)   Pulse 90   Temp 98.1 F (36.7 C) (Oral)   Resp 16   SpO2 97%      Physical Exam Vitals and nursing note reviewed.  Constitutional:      General: She is not in acute distress.    Appearance: She is well-developed and normal weight. She is ill-appearing.  HENT:     Head: Normocephalic and atraumatic.     Right Ear: Tympanic membrane, ear canal and external ear normal.     Left Ear: Tympanic membrane, ear canal and external ear normal.     Nose: Congestion present.     Mouth/Throat:     Mouth: Mucous membranes are moist.     Pharynx: Oropharynx is clear. Posterior oropharyngeal erythema present.     Comments: Cobblestoning present Eyes:     Extraocular Movements: Extraocular movements intact.     Conjunctiva/sclera: Conjunctivae normal.     Pupils: Pupils are equal, round, and reactive to light.  Cardiovascular:     Rate and Rhythm: Normal rate and regular rhythm.     Heart sounds: Normal heart sounds. No murmur heard.   Pulmonary:     Effort: Pulmonary effort is normal. No respiratory distress.     Breath sounds: Normal breath sounds. No stridor. No wheezing, rhonchi or rales.  Chest:     Chest wall: No tenderness.  Musculoskeletal:        General: Normal range of motion.     Cervical back: Normal range of motion and neck supple.  Lymphadenopathy:     Cervical: Cervical adenopathy present.  Skin:    General: Skin is warm and dry.     Capillary Refill: Capillary refill takes less than 2 seconds.  Neurological:     General: No focal deficit present.     Mental Status: She is alert and oriented to person, place, and time.  Psychiatric:        Mood and Affect: Mood normal.        Behavior: Behavior normal.        Thought Content: Thought content normal.      UC Treatments / Results  Labs (all labs ordered are listed, but only abnormal results are displayed) Labs  Reviewed  NOVEL CORONAVIRUS, NAA - Abnormal; Notable for the following components:      Result Value   SARS-CoV-2, NAA Detected (*)    All other components within normal limits   Narrative:    Performed at:  [address], [place], [place]  [id number] Lab Director: Rush Farmer MD, Phone:  [phone]  SARS-COV-2, NAA 2 DAY TAT   Narrative:    Performed at:  [address] [facility] [address], [place], [place]  [id number] Lab Director: Rush Farmer MD, Phone:  [phone]    EKG   Radiology No results found.  Procedures Procedures (including critical care time)  Medications Ordered in UC Medications - No data to display  Initial Impression / Assessment and Plan / UC Course  I have reviewed the triage vital signs and the nursing notes.  Pertinent labs & imaging results that were available during my care of the patient were reviewed by me and considered in my medical decision making (see chart for details).    Viral illness COVID screen Headache Fever Shortness of breath  Steroid taper prescribed May take 800 mg ibuprofen with 1000 mg of Tylenol.  Do not exceed 4000 mg of Tylenol in 24 hours. Paper prescription  for molnupiravir given pending positive result Start this when your result returns positive Covid swab obtained in office today.   Patient instructed to quarantine until results are back and negative.   If results are negative, patient may resume daily schedule as tolerated once they are fever free for 24 hours without the use of antipyretic medications.   If results are positive, patient instructed to quarantine for at least 5 days from symptom onset.  If after 5 days symptoms have resolved, may return to work with a well fitting mask for the next 5 days. If symptomatic after day 5, isolation should be extended to 10 days. Patient instructed to follow-up with primary care or with this office as needed.   Patient instructed to  follow-up in the ER for trouble swallowing, trouble breathing, other concerning symptoms.   Final Clinical Impressions(s) / UC Diagnoses   Final diagnoses:  Encounter for screening for COVID-19  Viral illness  Nonintractable headache, unspecified chronicity pattern, unspecified headache type  Fever, unspecified fever cause  SOB (shortness of breath)     Discharge Instructions     I have sent in a prednisone taper for you to take for 6 days. 6 tablets on day one, 5 tablets on day two, 4 tablets on day three, 3 tablets on day four, 2 tablets on day five, and 1 tablet on day six.  May take 800 mg ibuprofen with 1000 mg of Tylenol. Do not exceed 4000 mg of Tylenol in 24 hours.  I have sent in [place] for you to use one capsule every 8 hours as needed for cough.  I have sent a paper prescription for molnupiravir with you as well  Your COVID and Influenza tests are pending. You should self quarantine until the test results are back.    Take Tylenol or ibuprofen as needed for fever or discomfort.  Rest and keep yourself hydrated.    Follow-up with your primary care provider if your symptoms are not improving.          ED Prescriptions    Medication Sig Dispense Auth. Provider   molnupiravir EUA 200 mg CAPS  (Status: Discontinued) Take 4 capsules (800 mg total) by mouth 2 (two) times daily for 5 days. 40 capsule Faustino Congress, NP   benzonatate (TESSALON) 100 MG capsule Take 1 capsule (100 mg total) by mouth every 8 (eight) hours. 21 capsule Faustino Congress, NP   predniSONE (STERAPRED UNI-PAK 21 TAB) 10 MG (21) TBPK tablet Take by mouth daily for 6 days. Take 6 tablets on day 1, 5 tablets on day 2, 4 tablets on day 3, 3 tablets on day 4, 2 tablets on day 5, 1 tablet on day 6 21 tablet Faustino Congress, NP   molnupiravir EUA 200 mg CAPS Take 4 capsules (800 mg total) by mouth 2 (two) times daily for 5 days. 40 capsule Faustino Congress, NP     PDMP not  reviewed this encounter.   Faustino Congress, NP 03/21/21 [phone]

## 2021-03-16 NOTE — ED Triage Notes (Addendum)
Pt said today woke up with headache and scratchy throat. Pt said she just feels bad and feels like her body is feeling tired and sore. She said she feels like she has the flu. No fevers, no chills

## 2021-03-16 NOTE — Discharge Instructions (Signed)
I have sent in a prednisone taper for you to take for 6 days. 6 tablets on day one, 5 tablets on day two, 4 tablets on day three, 3 tablets on day four, 2 tablets on day five, and 1 tablet on day six.  May take 800 mg ibuprofen with 1000 mg of Tylenol. Do not exceed 4000 mg of Tylenol in 24 hours.  I have sent in [place] for you to use one capsule every 8 hours as needed for cough.  I have sent a paper prescription for molnupiravir with you as well  Your COVID and Influenza tests are pending. You should self quarantine until the test results are back.    Take Tylenol or ibuprofen as needed for fever or discomfort.  Rest and keep yourself hydrated.    Follow-up with your primary care provider if your symptoms are not improving.

## 2021-03-17 LAB — SARS-COV-2, NAA 2 DAY TAT

## 2021-03-17 LAB — NOVEL CORONAVIRUS, NAA: SARS-CoV-2, NAA: DETECTED — AB

## 2021-03-24 ENCOUNTER — Emergency Department (HOSPITAL_COMMUNITY): Payer: PPO

## 2021-03-24 ENCOUNTER — Emergency Department (HOSPITAL_COMMUNITY)
Admission: EM | Admit: 2021-03-24 | Discharge: 2021-03-24 | Disposition: A | Discharge status: Home / Self Care | Payer: PPO | Attending: Emergency Medicine | Admitting: Emergency Medicine

## 2021-03-24 ENCOUNTER — Encounter (HOSPITAL_COMMUNITY): Payer: Self-pay | Admitting: *Deleted

## 2021-03-24 ENCOUNTER — Other Ambulatory Visit: Payer: Self-pay

## 2021-03-24 DIAGNOSIS — Z79899 Other long term (current) drug therapy: Secondary | ICD-10-CM | POA: Insufficient documentation

## 2021-03-24 DIAGNOSIS — R0789 Other chest pain: Secondary | ICD-10-CM | POA: Diagnosis not present

## 2021-03-24 DIAGNOSIS — Z6829 Body mass index (BMI) 29.0-29.9, adult: Secondary | ICD-10-CM | POA: Diagnosis not present

## 2021-03-24 DIAGNOSIS — Z7982 Long term (current) use of aspirin: Secondary | ICD-10-CM | POA: Insufficient documentation

## 2021-03-24 DIAGNOSIS — E039 Hypothyroidism, unspecified: Secondary | ICD-10-CM | POA: Diagnosis not present

## 2021-03-24 DIAGNOSIS — Z96651 Presence of right artificial knee joint: Secondary | ICD-10-CM | POA: Insufficient documentation

## 2021-03-24 DIAGNOSIS — R0602 Shortness of breath: Secondary | ICD-10-CM | POA: Diagnosis not present

## 2021-03-24 DIAGNOSIS — E663 Overweight: Secondary | ICD-10-CM | POA: Diagnosis not present

## 2021-03-24 DIAGNOSIS — R079 Chest pain, unspecified: Secondary | ICD-10-CM | POA: Diagnosis not present

## 2021-03-24 DIAGNOSIS — Z87891 Personal history of nicotine dependence: Secondary | ICD-10-CM | POA: Diagnosis not present

## 2021-03-24 DIAGNOSIS — J449 Chronic obstructive pulmonary disease, unspecified: Secondary | ICD-10-CM | POA: Insufficient documentation

## 2021-03-24 DIAGNOSIS — Z96692 Finger-joint replacement of left hand: Secondary | ICD-10-CM | POA: Diagnosis not present

## 2021-03-24 DIAGNOSIS — U071 COVID-19: Secondary | ICD-10-CM | POA: Diagnosis not present

## 2021-03-24 LAB — CBC WITH DIFFERENTIAL/PLATELET
Abs Immature Granulocytes: 0.31 10*3/uL — ABNORMAL HIGH (ref 0.00–0.07)
Basophils Absolute: 0.1 10*3/uL (ref 0.0–0.1)
Basophils Relative: 1 %
Eosinophils Absolute: 0.4 10*3/uL (ref 0.0–0.5)
Eosinophils Relative: 4 %
HCT: 43.9 % (ref 36.0–46.0)
Hemoglobin: 13.7 g/dL (ref 12.0–15.0)
Immature Granulocytes: 3 %
Lymphocytes Relative: 27 %
Lymphs Abs: 3.3 10*3/uL (ref 0.7–4.0)
MCH: 29.3 pg (ref 26.0–34.0)
MCHC: 31.2 g/dL (ref 30.0–36.0)
MCV: 94 fL (ref 80.0–100.0)
Monocytes Absolute: 1 10*3/uL (ref 0.1–1.0)
Monocytes Relative: 8 %
Neutro Abs: 7.3 10*3/uL (ref 1.7–7.7)
Neutrophils Relative %: 57 %
Platelets: 372 10*3/uL (ref 150–400)
RBC: 4.67 MIL/uL (ref 3.87–5.11)
RDW: 15.2 % (ref 11.5–15.5)
WBC: 12.5 10*3/uL — ABNORMAL HIGH (ref 4.0–10.5)
nRBC: 0 % (ref 0.0–0.2)

## 2021-03-24 LAB — COMPREHENSIVE METABOLIC PANEL
ALT: 17 U/L (ref 0–44)
AST: 20 U/L (ref 15–41)
Albumin: 3.8 g/dL (ref 3.5–5.0)
Alkaline Phosphatase: 72 U/L (ref 38–126)
Anion gap: 10 (ref 5–15)
BUN: 15 mg/dL (ref 8–23)
CO2: 26 mmol/L (ref 22–32)
Calcium: 8.2 mg/dL — ABNORMAL LOW (ref 8.9–10.3)
Chloride: 98 mmol/L (ref 98–111)
Creatinine, Ser: 1.11 mg/dL — ABNORMAL HIGH (ref 0.44–1.00)
GFR, Estimated: 51 mL/min — ABNORMAL LOW (ref >60)
Glucose, Bld: 75 mg/dL (ref 70–99)
Potassium: 3.5 mmol/L (ref 3.5–5.1)
Sodium: 134 mmol/L — ABNORMAL LOW (ref 135–145)
Total Bilirubin: 0.6 mg/dL (ref 0.3–1.2)
Total Protein: 6.9 g/dL (ref 6.5–8.1)

## 2021-03-24 LAB — D-DIMER, QUANTITATIVE: D-Dimer, Quant: 0.56 ug/mL-FEU — ABNORMAL HIGH (ref 0.00–0.50)

## 2021-03-24 LAB — TROPONIN I (HIGH SENSITIVITY)
Troponin I (High Sensitivity): 2 ng/L (ref <18)
Troponin I (High Sensitivity): <2 ng/L (ref <18)

## 2021-03-24 NOTE — Discharge Instructions (Addendum)
Work-up here today without any concerns for blood clots in the lungs.  CT of chest that may be shows some early changes that could be consistent with COVID-pneumonia.  Work-up for the chest pain heart without any significant abnormalities.  Return for worsening shortness of breath or any new or worse symptoms.

## 2021-03-24 NOTE — ED Provider Notes (Addendum)
[place] Provider Note   CSN: [id number] Arrival date & time: 03/24/21  7510     History Chief Complaint  Patient presents with  . Chest Pain    Mary Hart is a 80 y.o. female.  Patient became symptomatic and tested positive for COVID infection on May 23.  Patient is told she has a history of COPD.  But never has a had low oxygen levels.  Patient with a complaint of chest pressure and shortness of breath that started yesterday.  Denies any wheezing.  Shortness of breath a little bit worse with walking.  The chest pressure is anterior part of the chest.  No leg swelling.  No injury.  Patient was treated with antiviral.        Past Medical History:  Diagnosis Date  . Anxiety   . Arthritis    oa  . Clostridium difficile infection 2013, 2015   Treated with vancomycin  . COPD (chronic obstructive pulmonary disease) ([place])   . Depression   . Diverticula of colon   . Genital herpes   . GERD (gastroesophageal reflux disease)   . Hypothyroid   . IBS (irritable bowel syndrome)   . Internal hemorrhoids   . Jaundice    as child cause unknown  . Neuropathy    fingers both hands  . PONV (postoperative nausea and vomiting)    did well with last neck fusion   . Recurrent colitis due to Clostridium difficile 07/03/2013   4 treatments with vancomycin since late 2013/early 2014 - responds then relapses    . Vocal cord dysfunction    SYASMONIC DYSPHONIA    Patient Active Problem List   Diagnosis Date Noted  . Pelvic floor weakness in female 04/14/2017  . OA (osteoarthritis) of knee 12/27/2016  . 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 Dysphonia 10/15/2016  . Laryngospasms 10/15/2016  . Leg weakness 02/12/2014  . Stiffness of joint, not elsewhere classified, lower leg 02/12/2014  . Difficulty in walking(719.7) 02/12/2014  . Irritable bowel syndrome 05/04/2012  . GERD (gastroesophageal reflux disease) 02/14/2012    Past Surgical  History:  Procedure Laterality Date  . ABDOMINAL HYSTERECTOMY    . Anterior Cervical Fusion X 2    . CHOLECYSTECTOMY    . COLONOSCOPY  06/20/2008   [place] Dr. Wynelle Bourgeois external hemorrhoids  . COLONOSCOPY  10/12/2012   Dr. Gala Romney- internal hemorrhoids, very early, few, sigmoid diverticula o/w normal appearing colon and terminal ileum. + cdiff on stool samples taken. Segmental bx negative.  . ESOPHAGOGASTRODUODENOSCOPY  11/25/10   [facility]- tubular esophagus s/p of 56 dilator from a small HH/otherwise normal  . EYE SURGERY Bilateral 2012   ioc with lens replacement for cataracts  . JOINT REPLACEMENT  09/2010   Thumb left  . meniscus repair right knee  2014  . TOTAL KNEE ARTHROPLASTY Right 12/27/2016   Procedure: RIGHT TOTAL KNEE ARTHROPLASTY;  Surgeon: Gaynelle Arabian, MD;  Location: [facility];  Service: Orthopedics;  Laterality: Right;  Adductor Block  . VESICOVAGINAL FISTULA CLOSURE W/ TAH       OB History    Gravida  1   Para  1   Term  1   Preterm      AB      Living        SAB      IAB      Ectopic      Multiple      Live Births  Family History  Problem Relation Age of Onset  . Dementia Father   . Dementia Mother   . Breast cancer Mother   . Colon cancer Maternal Uncle   . Allergic rhinitis Neg Hx   . Asthma Neg Hx   . Eczema Neg Hx   . Urticaria Neg Hx     Social History   Tobacco Use  . Smoking status: Former Smoker    Packs/day: 0.66    Years: 50.00    Pack years: 33.00    Types: Cigarettes  . Smokeless tobacco: Never Used  . Tobacco comment: 1/2 pack per day  Vaping Use  . Vaping Use: Former  Substance Use Topics  . Alcohol use: No  . Drug use: No    Home Medications Prior to Admission medications   Medication Sig Start Date End Date Taking? Authorizing Provider  albuterol (VENTOLIN HFA) 108 (90 Base) MCG/ACT inhaler Inhale 1-2 puffs into the lungs every 6 (six) hours as needed for wheezing or shortness of breath.   Yes [provider]  ALPRAZolam (XANAX) 0.25 MG tablet Take 1 tablet by mouth as needed for anxiety. 11/08/18  Yes Esterwood, Amy S, PA-C  aspirin 81 MG chewable tablet Chew 81 mg by mouth daily.   Yes [provider]  benzonatate (TESSALON) 100 MG capsule Take 1 capsule (100 mg total) by mouth every 8 (eight) hours. 03/16/21  Yes Faustino Congress, NP  BREO ELLIPTA 200-25 MCG/INH AEPB Inhale 1 puff into the lungs daily. 03/05/21  Yes [provider]  buPROPion (WELLBUTRIN XL) 150 MG 24 hr tablet Take 1 tablet by mouth daily. 03/11/21  Yes [provider]  cetirizine (ZYRTEC) 10 MG tablet Take 10 mg by mouth at bedtime.   Yes [provider]  cholecalciferol (VITAMIN D3) 25 MCG (1000 UNIT) tablet Take 1,000 Units by mouth daily.   Yes [provider]  gabapentin (NEURONTIN) 100 MG capsule Take 100-200 mg by mouth 2 (two) times daily. 1 capsule in am and 2 capsules at hs 11/23/19  Yes [provider]  levothyroxine (SYNTHROID) 75 MCG tablet Take 75 mcg by mouth daily before breakfast.   Yes [provider]  linaclotide (LINZESS) 72 MCG capsule Take 1 tablet by mouth every morning. 11/08/18  Yes Esterwood, Amy S, PA-C  ondansetron (ZOFRAN) 4 MG tablet Take 1 tablet (4 mg total) by mouth every 8 (eight) hours as needed for nausea or vomiting. Patient not taking: Reported on 03/24/2021 11/08/18   Esterwood, Amy S, PA-C    Allergies    Codeine, Hydrocodone, Hydromorphone hcl, Morphine, Moxifloxacin, and Sulfonamide derivatives  Review of Systems   Review of Systems  Constitutional: Negative for chills and fever.  HENT: Negative for congestion, rhinorrhea and sore throat.   Eyes: Negative for visual disturbance.  Respiratory: Positive for shortness of breath. Negative for cough.   Cardiovascular: Positive for chest pain. Negative for leg swelling.  Gastrointestinal: Negative for abdominal pain, diarrhea, nausea and vomiting.  Genitourinary:  Negative for dysuria.  Musculoskeletal: Negative for back pain and neck pain.  Skin: Negative for rash.  Neurological: Negative for dizziness, light-headedness and headaches.  Hematological: Does not bruise/bleed easily.  Psychiatric/Behavioral: Negative for confusion.    Physical Exam Updated Vital Signs BP 136/66   Pulse 62   Temp 97.8 F (36.6 C) (Oral)   Resp 12   Ht 1.575 m (5\' 2" )   Wt 70.3 kg   SpO2 97%   BMI 28.35 kg/m  Physical Exam Vitals and nursing note reviewed.  Constitutional:      General: She is not in acute distress.    Appearance: She is well-developed. She is not ill-appearing.  HENT:     Head: Normocephalic and atraumatic.  Eyes:     Extraocular Movements: Extraocular movements intact.     Conjunctiva/sclera: Conjunctivae normal.     Pupils: Pupils are equal, round, and reactive to light.  Cardiovascular:     Rate and Rhythm: Normal rate and regular rhythm.     Heart sounds: No murmur heard.   Pulmonary:     Effort: Pulmonary effort is normal. No respiratory distress.     Breath sounds: Normal breath sounds.  Chest:     Chest wall: No tenderness.  Abdominal:     Palpations: Abdomen is soft.     Tenderness: There is no abdominal tenderness.  Musculoskeletal:        General: No swelling. Normal range of motion.     Cervical back: Neck supple.  Skin:    General: Skin is warm and dry.     Capillary Refill: Capillary refill takes 2 to 3 seconds.  Neurological:     General: No focal deficit present.     Mental Status: She is alert and oriented to person, place, and time.     Cranial Nerves: No cranial nerve deficit.     Sensory: No sensory deficit.     Motor: No weakness.     ED Results / Procedures / Treatments   Labs (all labs ordered are listed, but only abnormal results are displayed) Labs Reviewed  COMPREHENSIVE METABOLIC PANEL - Abnormal; Notable for the following components:      Result Value   Sodium 134 (*)    Creatinine, Ser  1.11 (*)    Calcium 8.2 (*)    GFR, Estimated 51 (*)    All other components within normal limits  CBC WITH DIFFERENTIAL/PLATELET - Abnormal; Notable for the following components:   WBC 12.5 (*)    Abs Immature Granulocytes 0.31 (*)    All other components within normal limits  D-DIMER, QUANTITATIVE - Abnormal; Notable for the following components:   D-Dimer, Quant 0.56 (*)    All other components within normal limits  TROPONIN I (HIGH SENSITIVITY)  TROPONIN I (HIGH SENSITIVITY)    EKG EKG Interpretation  Date/Time:  Tuesday Mar 24 2021 08:44:10 EDT Ventricular Rate:  70 PR Interval:  154 QRS Duration: 77 QT Interval:  370 QTC Calculation: 400 R Axis:   52 Text Interpretation: Sinus rhythm Baseline wander in lead(s) II aVR No significant change since last tracing Confirmed by Fredia Sorrow [phone]) on 03/24/2021 8:51:31 AM   Radiology CT Chest Wo Contrast  Result Date: 03/24/2021 CLINICAL DATA:  80 year old female with chest pain, pressure and shortness of breath. Positive for COVID-19 last week. EXAM: CT CHEST WITHOUT CONTRAST TECHNIQUE: Multidetector CT imaging of the chest was performed following the standard protocol without IV contrast. COMPARISON:  Portable chest radiograph earlier today. Low-dose screening chest CT 11/13/2020. CT Abdomen and Pelvis 02/18/2017. FINDINGS: Cardiovascular: Calcified aortic atherosclerosis. Calcified coronary artery atherosclerosis. No cardiomegaly or pericardial effusion. Tortuous proximal great vessels. Vascular patency is not evaluated in the absence of IV contrast. Mediastinum/Nodes: Negative.  No mediastinal lymphadenopathy. Lungs/Pleura: Major airways are patent. There is mild bilateral perihilar bronchiectasis. Mild dependent atelectasis in both lungs is not significantly changed from January. Partially calcified mild apical lung scarring redemonstrated. There is a tiny area of tree in  bud nodular opacity in the left upper lobe anteriorly on  series 4, image 42 which is new from January. But otherwise the lungs are stable and negative. No pleural effusion. Upper Abdomen: Chronically absent gallbladder. Visible noncontrast upper abdominal viscera appear stable since 2018. Musculoskeletal: Advanced chronic thoracic disc and endplate degeneration appears stable since January. Partially visible cervical ACDF. No acute osseous abnormality identified. IMPRESSION: 1. Mild Bronchiectasis. Tiny area of new tree-in-bud nodular opacity in the left upper lobe since January could reflect minimal acute respiratory infection. But no other acute finding in the Chest. 2. Calcified coronary artery and Aortic Atherosclerosis (ICD10-I70.0). Electronically Signed   By: Genevie Ann M.D.   On: 03/24/2021 10:59   DG Chest Port 1 View  Result Date: 03/24/2021 CLINICAL DATA:  80 year old female with chest pain, pressure and shortness of breath. Positive for COVID-19. Last week. EXAM: PORTABLE CHEST 1 VIEW COMPARISON:  Chest CT 11/13/2020 and earlier. FINDINGS: Portable AP upright view at 0923 hours. Stable lung volumes and mediastinal contours. No cardiomegaly. Visualized tracheal air column is within normal limits. Allowing for portable technique the lungs are clear. No pneumothorax or pleural effusion. Chronic cervical ACDF. Chronic thoracic scoliosis. Stable cholecystectomy clips. No acute osseous abnormality identified. IMPRESSION: No acute cardiopulmonary abnormality. Electronically Signed   By: Genevie Ann M.D.   On: 03/24/2021 09:33    Procedures Procedures   Medications Ordered in ED Medications - No data to display  ED Course  I have reviewed the triage vital signs and the nursing notes.  Pertinent labs & imaging results that were available during my care of the patient were reviewed by me and considered in my medical decision making (see chart for details).    MDM Rules/Calculators/A&P                          Patient's troponin normal.  Would expect it to  be elevated since has been ongoing since yesterday.  Initial chest x-ray normal.  But not elevated for age adjustment.  CT chest was done to see if it showed any early signs of COVID-pneumonia.  There is a hint of that.  Patient's oxygen saturations are in the upper 90s.  She is not tachycardic she is able to ambulate here no acute distress.  Patient has second troponin cooking I will follow-up on that we will contact her if it is abnormal.  Patient stable for discharge home follow-up with her doctors precautions provided to return for any new or worse symptoms.   Final Clinical Impression(s) / ED Diagnoses Final diagnoses:  COVID  Atypical chest pain  SOB (shortness of breath)    Rx / DC Orders ED Discharge Orders    None       Fredia Sorrow, MD 03/24/21 1121   Addendum: Second troponin was less than 2.  No evidence of any significant cardiac event.  Suspect all symptoms related to COVID infection.   Fredia Sorrow, MD 03/24/21 1146

## 2021-03-24 NOTE — ED Triage Notes (Signed)
Pt c/o chest pressure and SOB that started yesterday. Hx of COPD but reports this feels worse than normal. Pt diagnosed with Covid on 03/16/21 and is now off her quarantine period. She states she was feeling much better other than some fatigue until yesterday. Pt was seen by PCP today and given 1 Nitro with some relief in pain and abnormal EKG.

## 2021-04-07 DIAGNOSIS — Z981 Arthrodesis status: Secondary | ICD-10-CM | POA: Diagnosis not present

## 2021-04-07 DIAGNOSIS — M5030 Other cervical disc degeneration, unspecified cervical region: Secondary | ICD-10-CM | POA: Diagnosis not present

## 2021-04-07 DIAGNOSIS — M5031 Other cervical disc degeneration,  high cervical region: Secondary | ICD-10-CM | POA: Diagnosis not present

## 2021-04-07 DIAGNOSIS — M4802 Spinal stenosis, cervical region: Secondary | ICD-10-CM | POA: Diagnosis not present

## 2021-04-07 DIAGNOSIS — M5023 Other cervical disc displacement, cervicothoracic region: Secondary | ICD-10-CM | POA: Diagnosis not present

## 2021-04-07 DIAGNOSIS — M5021 Other cervical disc displacement,  high cervical region: Secondary | ICD-10-CM | POA: Diagnosis not present

## 2021-04-20 DIAGNOSIS — J342 Deviated nasal septum: Secondary | ICD-10-CM | POA: Diagnosis not present

## 2021-04-20 DIAGNOSIS — J343 Hypertrophy of nasal turbinates: Secondary | ICD-10-CM | POA: Diagnosis not present

## 2021-04-20 DIAGNOSIS — J310 Chronic rhinitis: Secondary | ICD-10-CM | POA: Diagnosis not present

## 2021-05-20 DIAGNOSIS — L821 Other seborrheic keratosis: Secondary | ICD-10-CM | POA: Diagnosis not present

## 2021-05-20 DIAGNOSIS — D2271 Melanocytic nevi of right lower limb, including hip: Secondary | ICD-10-CM | POA: Diagnosis not present

## 2021-05-20 DIAGNOSIS — D485 Neoplasm of uncertain behavior of skin: Secondary | ICD-10-CM | POA: Diagnosis not present

## 2021-05-20 DIAGNOSIS — D225 Melanocytic nevi of trunk: Secondary | ICD-10-CM | POA: Diagnosis not present

## 2021-05-20 DIAGNOSIS — L900 Lichen sclerosus et atrophicus: Secondary | ICD-10-CM | POA: Diagnosis not present

## 2021-05-20 DIAGNOSIS — Z1283 Encounter for screening for malignant neoplasm of skin: Secondary | ICD-10-CM | POA: Diagnosis not present

## 2021-05-20 DIAGNOSIS — L304 Erythema intertrigo: Secondary | ICD-10-CM | POA: Diagnosis not present

## 2021-05-24 DIAGNOSIS — E039 Hypothyroidism, unspecified: Secondary | ICD-10-CM | POA: Diagnosis not present

## 2021-05-24 DIAGNOSIS — J449 Chronic obstructive pulmonary disease, unspecified: Secondary | ICD-10-CM | POA: Diagnosis not present

## 2021-05-28 DIAGNOSIS — E039 Hypothyroidism, unspecified: Secondary | ICD-10-CM | POA: Diagnosis not present

## 2021-05-28 DIAGNOSIS — E559 Vitamin D deficiency, unspecified: Secondary | ICD-10-CM | POA: Diagnosis not present

## 2021-05-28 DIAGNOSIS — G4719 Other hypersomnia: Secondary | ICD-10-CM | POA: Diagnosis not present

## 2021-05-28 DIAGNOSIS — F419 Anxiety disorder, unspecified: Secondary | ICD-10-CM | POA: Diagnosis not present

## 2021-05-28 DIAGNOSIS — J449 Chronic obstructive pulmonary disease, unspecified: Secondary | ICD-10-CM | POA: Diagnosis not present

## 2021-05-28 DIAGNOSIS — E538 Deficiency of other specified B group vitamins: Secondary | ICD-10-CM | POA: Diagnosis not present

## 2021-05-28 DIAGNOSIS — R739 Hyperglycemia, unspecified: Secondary | ICD-10-CM | POA: Diagnosis not present

## 2021-05-28 DIAGNOSIS — Z23 Encounter for immunization: Secondary | ICD-10-CM | POA: Diagnosis not present

## 2021-05-28 DIAGNOSIS — Z6827 Body mass index (BMI) 27.0-27.9, adult: Secondary | ICD-10-CM | POA: Diagnosis not present

## 2021-05-28 DIAGNOSIS — M8580 Other specified disorders of bone density and structure, unspecified site: Secondary | ICD-10-CM | POA: Diagnosis not present

## 2021-06-16 DIAGNOSIS — G4730 Sleep apnea, unspecified: Secondary | ICD-10-CM | POA: Diagnosis not present

## 2021-06-18 DIAGNOSIS — Z1231 Encounter for screening mammogram for malignant neoplasm of breast: Secondary | ICD-10-CM | POA: Diagnosis not present

## 2021-06-21 IMAGING — CT CT CHEST W/O CM
2 of 4 series · 15 of 36 positions shown, 18 images · non-contrast
Comparison: Portable chest radiograph earlier today. Low-dose
screening chest CT 11/13/2020. CT Abdomen and Pelvis 02/18/2017.

CLINICAL DATA: 80-year-old female with chest pain, pressure and
shortness of breath. Positive for [id number] last week.

EXAM:
CT CHEST WITHOUT CONTRAST
TECHNIQUE: Multidetector CT imaging of the chest was performed following the
standard protocol without IV contrast.

[Series 2: routine chest without · axial · non-contrast · 0.62mm/px · z∈[-562,-320]mm · 12 of 145 slices shown, 15 images]
[im 12/145  mediastinal]
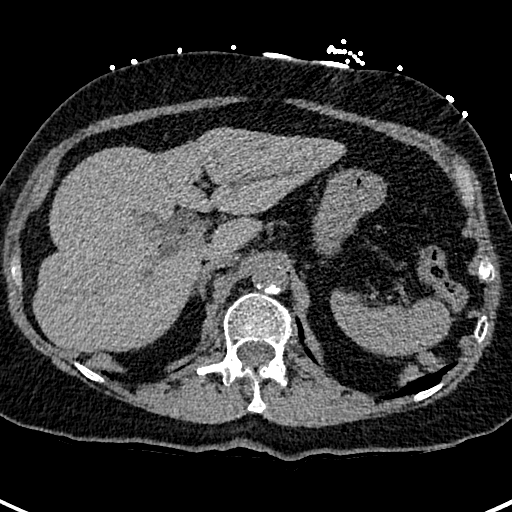
[im 12/145  lung]
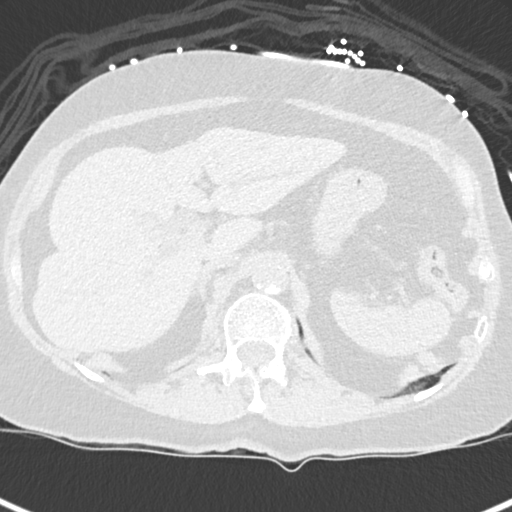
[im 23/145  lung]
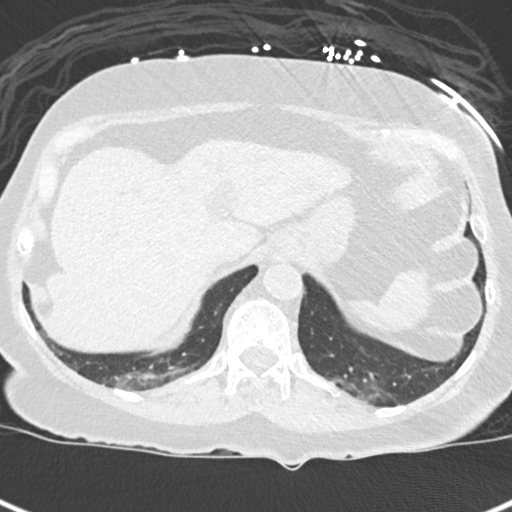
[im 34/145  lung]
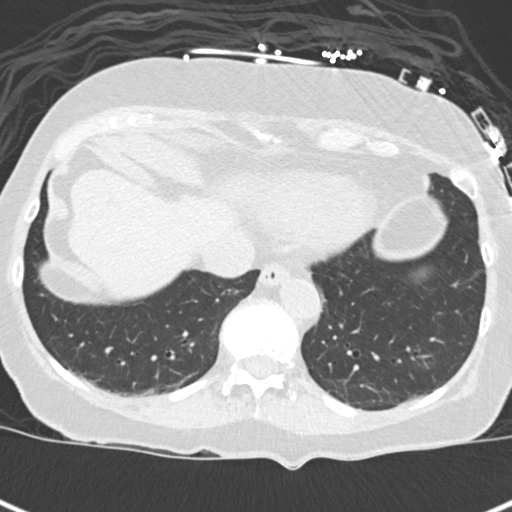
[im 45/145  lung]
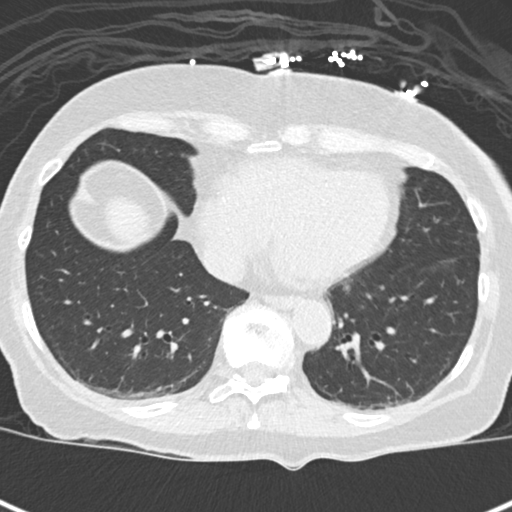
[im 56/145  mediastinal]
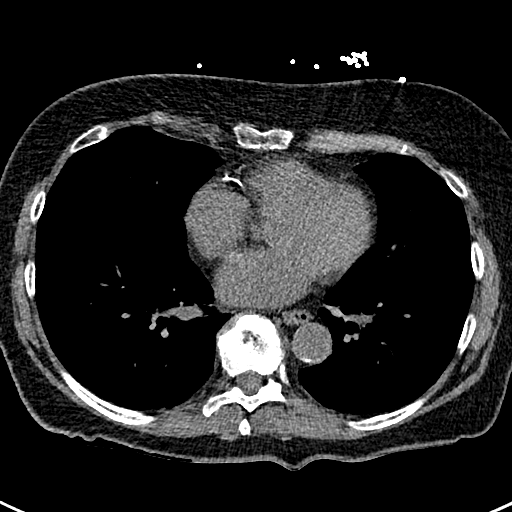
[im 56/145  lung]
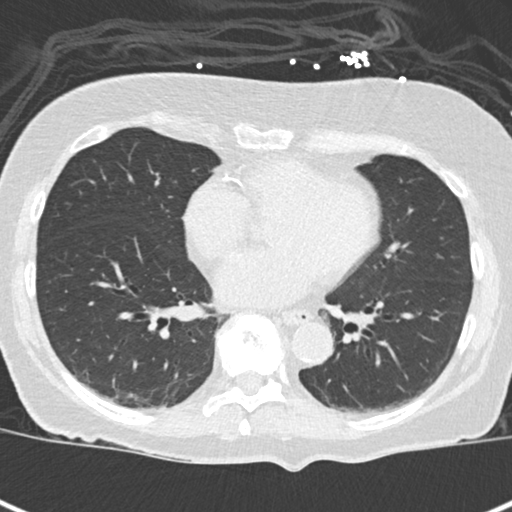
[im 67/145  lung]
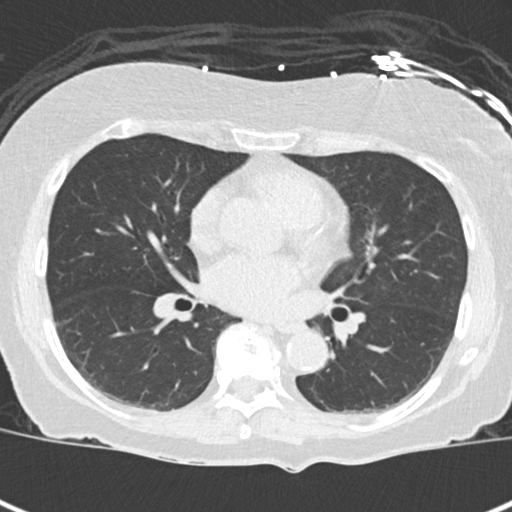
[im 78/145  lung]
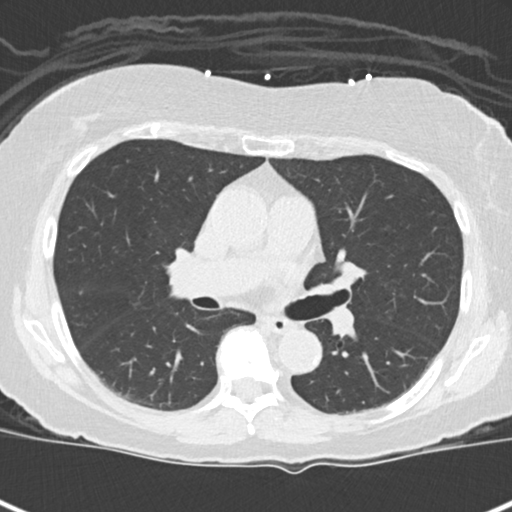
[im 89/145  lung]
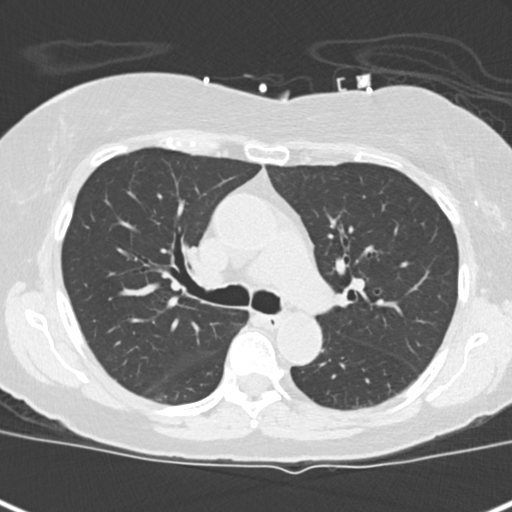
[im 100/145  mediastinal]
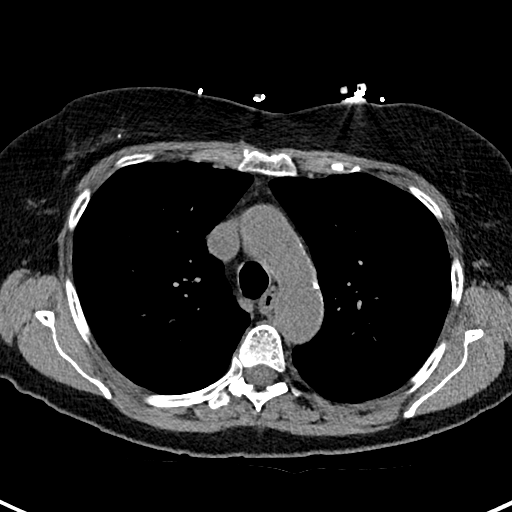
[im 100/145  lung]
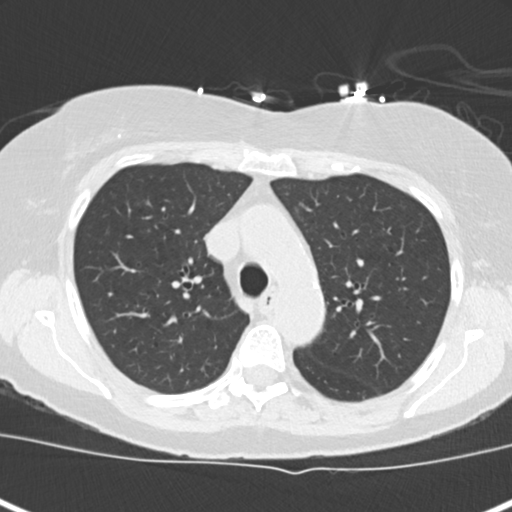
[im 111/145  lung]
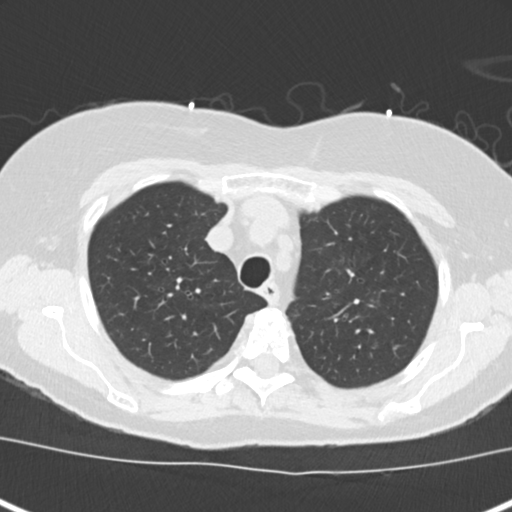
[im 122/145  lung]
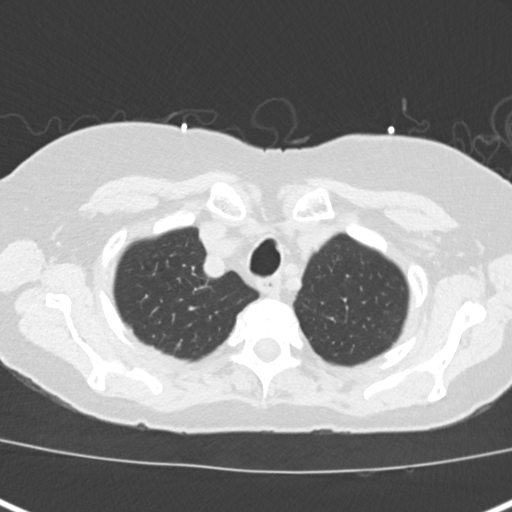
[im 133/145  lung]
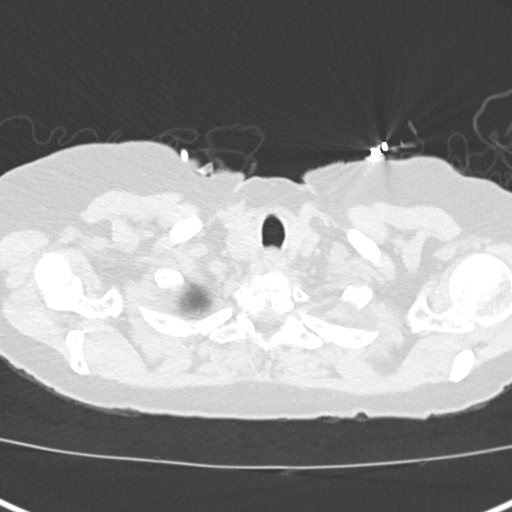

[Series 5: coronal · coronal · 0.59mm/px · 3 of 115 slices shown]
[im 23/115  lung]
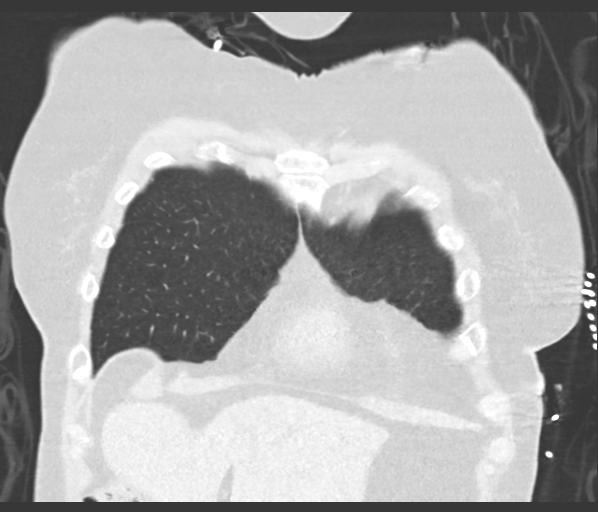
[im 46/115  lung]
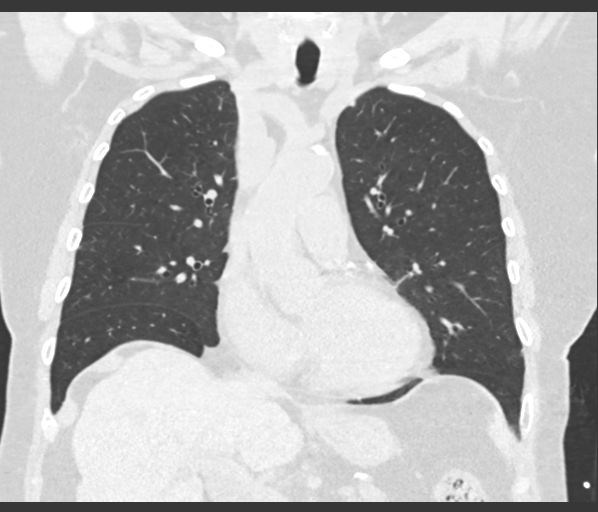
[im 69/115  lung]
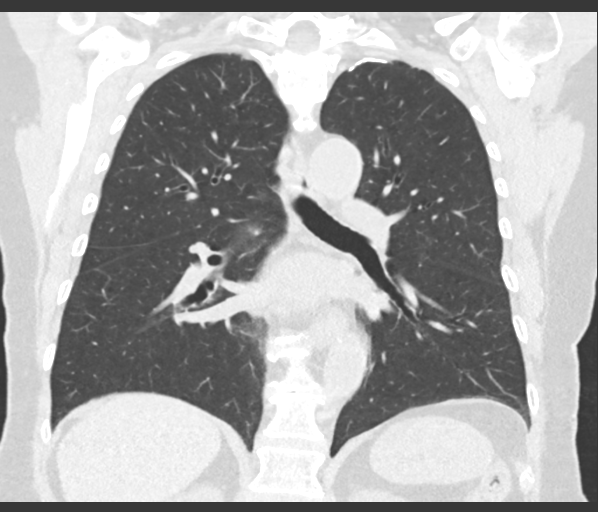

[15 of 36 positions shown; findings below may reference images not displayed]

FINDINGS: Cardiovascular: Calcified aortic atherosclerosis. Calcified coronary
artery atherosclerosis. No cardiomegaly or pericardial effusion.
Tortuous proximal great vessels. Vascular patency is not evaluated
in the absence of IV contrast.

Mediastinum/Nodes: Negative.  No mediastinal lymphadenopathy.

Lungs/Pleura: Major airways are patent. There is mild bilateral
perihilar bronchiectasis. Mild dependent atelectasis in both lungs
is not significantly changed from Dragan Gane. Partially calcified mild
apical lung scarring redemonstrated. There is a tiny area of tree in
bud nodular opacity in the left upper lobe anteriorly on series 4,
image 42 which is new from Dragan Gane. But otherwise the lungs are
stable and negative. No pleural effusion.

Upper Abdomen: Chronically absent gallbladder. Visible noncontrast
upper abdominal viscera appear stable since 9746.

Musculoskeletal: Advanced chronic thoracic disc and endplate
degeneration appears stable since [REDACTED]. Partially visible
cervical ACDF. No acute osseous abnormality identified.
IMPRESSION: 1. Mild Bronchiectasis. Tiny area of new tree-in-bud nodular opacity
in the left upper lobe since [REDACTED] could reflect minimal acute
respiratory infection. But no other acute finding in the Chest.

2. Calcified coronary artery and Aortic Atherosclerosis
([id number]).

## 2021-07-24 DIAGNOSIS — E039 Hypothyroidism, unspecified: Secondary | ICD-10-CM | POA: Diagnosis not present

## 2021-07-24 DIAGNOSIS — J449 Chronic obstructive pulmonary disease, unspecified: Secondary | ICD-10-CM | POA: Diagnosis not present

## 2021-07-27 DIAGNOSIS — E2839 Other primary ovarian failure: Secondary | ICD-10-CM | POA: Diagnosis not present

## 2021-07-27 DIAGNOSIS — M8588 Other specified disorders of bone density and structure, other site: Secondary | ICD-10-CM | POA: Diagnosis not present

## 2021-07-27 LAB — HM DEXA SCAN

## 2021-08-27 DIAGNOSIS — J342 Deviated nasal septum: Secondary | ICD-10-CM | POA: Diagnosis not present

## 2021-08-27 DIAGNOSIS — J343 Hypertrophy of nasal turbinates: Secondary | ICD-10-CM | POA: Diagnosis not present

## 2021-08-27 DIAGNOSIS — J310 Chronic rhinitis: Secondary | ICD-10-CM | POA: Diagnosis not present

## 2021-09-02 ENCOUNTER — Other Ambulatory Visit (HOSPITAL_COMMUNITY): Payer: Self-pay | Admitting: Otolaryngology

## 2021-09-02 ENCOUNTER — Other Ambulatory Visit: Payer: Self-pay | Admitting: Otolaryngology

## 2021-09-02 ENCOUNTER — Other Ambulatory Visit (HOSPITAL_BASED_OUTPATIENT_CLINIC_OR_DEPARTMENT_OTHER): Payer: Self-pay | Admitting: Otolaryngology

## 2021-09-02 DIAGNOSIS — J329 Chronic sinusitis, unspecified: Secondary | ICD-10-CM

## 2021-09-10 ENCOUNTER — Other Ambulatory Visit: Payer: Self-pay

## 2021-09-10 ENCOUNTER — Ambulatory Visit (HOSPITAL_COMMUNITY)
Admission: RE | Admit: 2021-09-10 | Discharge: 2021-09-10 | Disposition: A | Discharge status: Home / Self Care | Payer: PPO | Source: Ambulatory Visit | Attending: Otolaryngology | Admitting: Otolaryngology

## 2021-09-10 DIAGNOSIS — J329 Chronic sinusitis, unspecified: Secondary | ICD-10-CM | POA: Diagnosis not present

## 2021-09-10 DIAGNOSIS — Z981 Arthrodesis status: Secondary | ICD-10-CM | POA: Diagnosis not present

## 2021-10-05 ENCOUNTER — Other Ambulatory Visit (HOSPITAL_COMMUNITY): Payer: PPO

## 2021-10-09 DIAGNOSIS — E6609 Other obesity due to excess calories: Secondary | ICD-10-CM | POA: Diagnosis not present

## 2021-10-09 DIAGNOSIS — K5732 Diverticulitis of large intestine without perforation or abscess without bleeding: Secondary | ICD-10-CM | POA: Diagnosis not present

## 2021-10-09 DIAGNOSIS — Z6829 Body mass index (BMI) 29.0-29.9, adult: Secondary | ICD-10-CM | POA: Diagnosis not present

## 2021-10-09 DIAGNOSIS — R1032 Left lower quadrant pain: Secondary | ICD-10-CM | POA: Diagnosis not present

## 2021-10-09 DIAGNOSIS — E039 Hypothyroidism, unspecified: Secondary | ICD-10-CM | POA: Diagnosis not present

## 2021-10-23 DIAGNOSIS — J342 Deviated nasal septum: Secondary | ICD-10-CM | POA: Diagnosis not present

## 2021-10-23 DIAGNOSIS — J343 Hypertrophy of nasal turbinates: Secondary | ICD-10-CM | POA: Diagnosis not present

## 2021-10-23 DIAGNOSIS — J310 Chronic rhinitis: Secondary | ICD-10-CM | POA: Diagnosis not present

## 2021-11-03 ENCOUNTER — Other Ambulatory Visit: Payer: Self-pay | Admitting: Otolaryngology

## 2021-11-17 DIAGNOSIS — D225 Melanocytic nevi of trunk: Secondary | ICD-10-CM | POA: Diagnosis not present

## 2021-11-17 DIAGNOSIS — L820 Inflamed seborrheic keratosis: Secondary | ICD-10-CM | POA: Diagnosis not present

## 2021-11-17 DIAGNOSIS — Z1283 Encounter for screening for malignant neoplasm of skin: Secondary | ICD-10-CM | POA: Diagnosis not present

## 2021-11-26 ENCOUNTER — Encounter (HOSPITAL_BASED_OUTPATIENT_CLINIC_OR_DEPARTMENT_OTHER): Payer: Self-pay | Admitting: Otolaryngology

## 2021-11-26 ENCOUNTER — Other Ambulatory Visit: Payer: Self-pay

## 2021-12-07 ENCOUNTER — Ambulatory Visit (HOSPITAL_BASED_OUTPATIENT_CLINIC_OR_DEPARTMENT_OTHER): Payer: PPO | Admitting: Certified Registered"

## 2021-12-07 ENCOUNTER — Encounter (HOSPITAL_BASED_OUTPATIENT_CLINIC_OR_DEPARTMENT_OTHER)
Admission: RE | Disposition: A | Discharge status: Home / Self Care | Payer: Self-pay | Source: Home / Self Care | Attending: Otolaryngology

## 2021-12-07 ENCOUNTER — Ambulatory Visit (HOSPITAL_BASED_OUTPATIENT_CLINIC_OR_DEPARTMENT_OTHER)
Admission: RE | Admit: 2021-12-07 | Discharge: 2021-12-07 | Disposition: A | Discharge status: Home / Self Care | Payer: PPO | Attending: Otolaryngology | Admitting: Otolaryngology

## 2021-12-07 ENCOUNTER — Encounter (HOSPITAL_BASED_OUTPATIENT_CLINIC_OR_DEPARTMENT_OTHER): Payer: Self-pay | Admitting: Otolaryngology

## 2021-12-07 ENCOUNTER — Other Ambulatory Visit: Payer: Self-pay

## 2021-12-07 DIAGNOSIS — J343 Hypertrophy of nasal turbinates: Secondary | ICD-10-CM

## 2021-12-07 DIAGNOSIS — J342 Deviated nasal septum: Secondary | ICD-10-CM

## 2021-12-07 DIAGNOSIS — Z87891 Personal history of nicotine dependence: Secondary | ICD-10-CM | POA: Diagnosis not present

## 2021-12-07 DIAGNOSIS — J3489 Other specified disorders of nose and nasal sinuses: Secondary | ICD-10-CM | POA: Insufficient documentation

## 2021-12-07 HISTORY — PX: NASAL SEPTOPLASTY W/ TURBINOPLASTY: SHX2070

## 2021-12-07 SURGERY — SEPTOPLASTY, NOSE, WITH NASAL TURBINATE REDUCTION
Anesthesia: General | Site: Nose | Laterality: Bilateral

## 2021-12-07 MED ORDER — ROCURONIUM BROMIDE 100 MG/10ML IV SOLN
INTRAVENOUS | Status: DC | PRN
  Administered 2021-12-07: 60 mg via INTRAVENOUS

## 2021-12-07 MED ORDER — ONDANSETRON HCL 4 MG/2ML IJ SOLN
INTRAMUSCULAR | Status: DC | PRN
  Administered 2021-12-07: 4 mg via INTRAVENOUS

## 2021-12-07 MED ORDER — OXYCODONE HCL 5 MG/5ML PO SOLN: 5.0000 mg | Freq: Once | ORAL | Status: DC | PRN

## 2021-12-07 MED ORDER — FENTANYL CITRATE (PF) 100 MCG/2ML IJ SOLN
INTRAMUSCULAR | Status: DC | PRN
  Administered 2021-12-07 (×4): 25 ug via INTRAVENOUS

## 2021-12-07 MED ORDER — PROPOFOL 10 MG/ML IV BOLUS
INTRAVENOUS | Status: DC | PRN
  Administered 2021-12-07: 100 mg via INTRAVENOUS

## 2021-12-07 MED ORDER — OXYCODONE-ACETAMINOPHEN 5-325 MG PO TABS: 1.0000 | ORAL_TABLET | ORAL | 0 refills | Status: AC | PRN

## 2021-12-07 MED ORDER — OXYMETAZOLINE HCL 0.05 % NA SOLN
NASAL | Status: AC
  Filled 2021-12-07: qty 30

## 2021-12-07 MED ORDER — OXYCODONE HCL 5 MG PO TABS: 5.0000 mg | ORAL_TABLET | Freq: Once | ORAL | Status: DC | PRN

## 2021-12-07 MED ORDER — AMISULPRIDE (ANTIEMETIC) 5 MG/2ML IV SOLN
10.0000 mg | Freq: Once | INTRAVENOUS | Status: AC | PRN
  Administered 2021-12-07: 10 mg via INTRAVENOUS

## 2021-12-07 MED ORDER — LIDOCAINE-EPINEPHRINE 1 %-1:100000 IJ SOLN
INTRAMUSCULAR | Status: AC
  Filled 2021-12-07: qty 1

## 2021-12-07 MED ORDER — SUGAMMADEX SODIUM 200 MG/2ML IV SOLN
INTRAVENOUS | Status: DC | PRN
  Administered 2021-12-07: 130.4 mg via INTRAVENOUS

## 2021-12-07 MED ORDER — PHENYLEPHRINE HCL (PRESSORS) 10 MG/ML IV SOLN
INTRAVENOUS | Status: DC | PRN
  Administered 2021-12-07: 80 ug via INTRAVENOUS

## 2021-12-07 MED ORDER — OXYMETAZOLINE HCL 0.05 % NA SOLN
NASAL | Status: DC | PRN
  Administered 2021-12-07: 1 via TOPICAL

## 2021-12-07 MED ORDER — LIDOCAINE 2% (20 MG/ML) 5 ML SYRINGE
INTRAMUSCULAR | Status: AC
  Filled 2021-12-07: qty 5

## 2021-12-07 MED ORDER — DIPHENHYDRAMINE HCL 50 MG/ML IJ SOLN
INTRAMUSCULAR | Status: DC | PRN
  Administered 2021-12-07 (×2): 12.5 mg via INTRAVENOUS

## 2021-12-07 MED ORDER — CEFAZOLIN SODIUM-DEXTROSE 2-3 GM-%(50ML) IV SOLR
INTRAVENOUS | Status: DC | PRN
  Administered 2021-12-07: 2 g via INTRAVENOUS

## 2021-12-07 MED ORDER — ROCURONIUM BROMIDE 10 MG/ML (PF) SYRINGE
PREFILLED_SYRINGE | INTRAVENOUS | Status: AC
  Filled 2021-12-07: qty 10

## 2021-12-07 MED ORDER — ONDANSETRON HCL 4 MG/2ML IJ SOLN
INTRAMUSCULAR | Status: AC
  Filled 2021-12-07: qty 2

## 2021-12-07 MED ORDER — MUPIROCIN 2 % EX OINT
TOPICAL_OINTMENT | CUTANEOUS | Status: AC
  Filled 2021-12-07: qty 22

## 2021-12-07 MED ORDER — ACETAMINOPHEN 500 MG PO TABS
1000.0000 mg | ORAL_TABLET | Freq: Once | ORAL | Status: AC
  Administered 2021-12-07: 1000 mg via ORAL

## 2021-12-07 MED ORDER — ACETAMINOPHEN 500 MG PO TABS
ORAL_TABLET | ORAL | Status: AC
  Filled 2021-12-07: qty 2

## 2021-12-07 MED ORDER — AMISULPRIDE (ANTIEMETIC) 5 MG/2ML IV SOLN
INTRAVENOUS | Status: AC
  Filled 2021-12-07: qty 4

## 2021-12-07 MED ORDER — ONDANSETRON HCL 4 MG/2ML IJ SOLN: 4.0000 mg | Freq: Once | INTRAMUSCULAR | Status: DC | PRN

## 2021-12-07 MED ORDER — LIDOCAINE HCL (CARDIAC) PF 100 MG/5ML IV SOSY
PREFILLED_SYRINGE | INTRAVENOUS | Status: DC | PRN
  Administered 2021-12-07: 40 mg via INTRAVENOUS

## 2021-12-07 MED ORDER — LIDOCAINE-EPINEPHRINE 1 %-1:100000 IJ SOLN
INTRAMUSCULAR | Status: DC | PRN
  Administered 2021-12-07: 3.5 mL

## 2021-12-07 MED ORDER — PROPOFOL 10 MG/ML IV BOLUS
INTRAVENOUS | Status: AC
  Filled 2021-12-07: qty 20

## 2021-12-07 MED ORDER — FENTANYL CITRATE (PF) 100 MCG/2ML IJ SOLN: 25.0000 ug | INTRAMUSCULAR | Status: DC | PRN

## 2021-12-07 MED ORDER — DEXAMETHASONE SODIUM PHOSPHATE 4 MG/ML IJ SOLN
INTRAMUSCULAR | Status: DC | PRN
  Administered 2021-12-07: 6 mg via INTRAVENOUS

## 2021-12-07 MED ORDER — FENTANYL CITRATE (PF) 100 MCG/2ML IJ SOLN
INTRAMUSCULAR | Status: AC
  Filled 2021-12-07: qty 2

## 2021-12-07 MED ORDER — MUPIROCIN 2 % EX OINT
TOPICAL_OINTMENT | CUTANEOUS | Status: DC | PRN
Start: 2021-12-07 — End: 2021-12-07
  Administered 2021-12-07: 1 via NASAL

## 2021-12-07 MED ORDER — LACTATED RINGERS IV SOLN: INTRAVENOUS | Status: DC

## 2021-12-07 MED ORDER — DEXAMETHASONE SODIUM PHOSPHATE 10 MG/ML IJ SOLN
INTRAMUSCULAR | Status: AC
  Filled 2021-12-07: qty 1

## 2021-12-07 SURGICAL SUPPLY — 34 items
ATTRACTOMAT 16X20 MAGNETIC DRP (DRAPES) IMPLANT
CANISTER SUCT 1200ML W/VALVE (MISCELLANEOUS) ×2 IMPLANT
COAGULATOR SUCT 8FR VV (MISCELLANEOUS) ×2 IMPLANT
DEFOGGER MIRROR 1QT (MISCELLANEOUS) ×2 IMPLANT
DRSG NASOPORE 8CM (GAUZE/BANDAGES/DRESSINGS) IMPLANT
DRSG TELFA 3X8 NADH (GAUZE/BANDAGES/DRESSINGS) IMPLANT
ELECT REM PT RETURN 9FT ADLT (ELECTROSURGICAL) ×2
ELECTRODE REM PT RTRN 9FT ADLT (ELECTROSURGICAL) ×1 IMPLANT
GAUZE 4X4 16PLY ~~LOC~~+RFID DBL (SPONGE) ×1 IMPLANT
GLOVE SURG ENC MOIS LTX SZ7.5 (GLOVE) ×2 IMPLANT
GLOVE SURG POLYISO LF SZ7 (GLOVE) ×1 IMPLANT
GLOVE SURG UNDER POLY LF SZ7 (GLOVE) ×2 IMPLANT
GOWN STRL REUS W/ TWL LRG LVL3 (GOWN DISPOSABLE) ×2 IMPLANT
GOWN STRL REUS W/TWL LRG LVL3 (GOWN DISPOSABLE) ×4
NDL HYPO 25X1 1.5 SAFETY (NEEDLE) ×1 IMPLANT
NEEDLE HYPO 25X1 1.5 SAFETY (NEEDLE) ×2 IMPLANT
NS IRRIG 1000ML POUR BTL (IV SOLUTION) ×2 IMPLANT
PACK BASIN DAY SURGERY FS (CUSTOM PROCEDURE TRAY) ×2 IMPLANT
PACK ENT DAY SURGERY (CUSTOM PROCEDURE TRAY) ×2 IMPLANT
PAD DRESSING TELFA 3X8 NADH (GAUZE/BANDAGES/DRESSINGS) IMPLANT
SLEEVE SCD COMPRESS KNEE MED (STOCKING) ×1 IMPLANT
SPIKE FLUID TRANSFER (MISCELLANEOUS) IMPLANT
SPLINT NASAL AIRWAY SILICONE (MISCELLANEOUS) ×2 IMPLANT
SPONGE GAUZE 2X2 8PLY STRL LF (GAUZE/BANDAGES/DRESSINGS) ×2 IMPLANT
SPONGE NEURO XRAY DETECT 1X3 (DISPOSABLE) ×2 IMPLANT
SUT CHROMIC 4 0 P 3 18 (SUTURE) ×2 IMPLANT
SUT PLAIN 4 0 ~~LOC~~ 1 (SUTURE) ×2 IMPLANT
SUT PROLENE 3 0 PS 2 (SUTURE) ×2 IMPLANT
SUT VIC AB 4-0 P-3 18XBRD (SUTURE) IMPLANT
SUT VIC AB 4-0 P3 18 (SUTURE)
TOWEL GREEN STERILE FF (TOWEL DISPOSABLE) ×2 IMPLANT
TUBE SALEM SUMP 12R W/ARV (TUBING) IMPLANT
TUBE SALEM SUMP 16 FR W/ARV (TUBING) ×2 IMPLANT
YANKAUER SUCT BULB TIP NO VENT (SUCTIONS) ×2 IMPLANT

## 2021-12-07 NOTE — H&P (Signed)
Cc: Chronic nasal obstruction  HPI: The patient is a 81 year old female who returns today for follow-up evaluation.  The patient has a history of chronic nasal obstruction and recurrent sinusitis.  She was treated with Flonase nasal spray and allergy medications.  At her last visit 2 months ago, she was noted to have chronic rhinitis, nasal mucosal congestion, nasal septal deviation, and bilateral inferior turbinate hypertrophy.  No acute infection was noted at that time.  She subsequently underwent a sinus CT scan.  The CT showed no evidence of significant acute or chronic sinusitis.  The patient returns today complaining of persistent difficulty breathing through her nose.  She denies any fever, purulent drainage, or visual change.  Exam: General: Communicates without difficulty, well nourished, no acute distress. Head: Normocephalic, no evidence injury, no tenderness, facial buttresses intact without stepoff. Eyes: PERRL, EOMI.  No scleral icterus, conjunctivae clear. Ears: External auditory canals clear bilaterally.  There is no edema or erythema.  Tympanic membrane is within normal limits bilaterally. Nose: Normal skin and external support.  Anterior rhinoscopy reveals congested mucosa over the septum and turbinates.  No lesions or polyps were seen. Oral cavity: Lips without lesions, oral mucosa moist, no masses or lesions seen. Indirect  mirror laryngoscopy could not be tolerated. Pharynx: Clear, no erythema. Neck: Supple, full range of motion, no lymphadenopathy, no masses palpable. Salivary: Parotid and submandibular glands without mass. Neuro:  CN 2-12 grossly intact. Gait normal. Vestibular: No nystagmus at any point of gaze. A flexible scope was inserted into the right nasal cavity.  Endoscopy of the interior nasal cavity, superior, inferior, and middle meatus was performed. The sphenoid-ethmoid recess was examined. Edematous mucosa was noted.  No polyp, mass, or lesion was appreciated. Nasal septal  deviation noted.  Olfactory cleft was clear.  Nasopharynx was clear.  Turbinates were hypertrophied but without mass. The procedure was repeated on the contralateral side with similar findings.  The patient tolerated the procedure well.  Assessment: 1.  Chronic rhinitis, nasal mucosal congestion, nasal septal deviation, and bilateral inferior turbinate hypertrophy.   2.  More than 95% of her nasal passageways are obstructed bilaterally. 3.  The patient has not responded to medical treatment so far.  Plan: 1.  The nasal endoscopy findings and the CT images are reviewed with the patient. 2.  Continue with Flonase nasal spray daily. 3.  Nasal saline irrigation. 4.  In light of her persistent symptoms, she may benefit from surgical intervention with septoplasty and bilateral turbinate reduction.  The risk, benefits, and details of the procedures are discussed.  Questions are invited and answered. 5.  The patient would like to proceed with the procedures.

## 2021-12-07 NOTE — Discharge Instructions (Addendum)
POSTOPERATIVE INSTRUCTIONS FOR PATIENTS HAVING NASAL OR SINUS OPERATIONS ACTIVITY: Restrict activity at home for the first two days, resting as much as possible. Light activity is best. You may usually return to work within a week. You should refrain from nose blowing, strenuous activity, or heavy lifting greater than 20lbs for a total of one week after your operation.  If sneezing cannot be avoided, sneeze with your mouth open. DISCOMFORT: You may experience a dull headache and pressure along with nasal congestion and discharge. These symptoms may be worse during the first week after the operation but may last as long as two to four weeks.  Please take Tylenol or the pain medication that has been prescribed for you. Do not take aspirin or aspirin containing medications since they may cause bleeding.  You may experience symptoms of post nasal drainage, nasal congestion, headaches and fatigue for two or three months after your operation.  BLEEDING: You may have some blood tinged nasal drainage for approximately two weeks after the operation.  The discharge will be worse for the first week.  Please call our office at [phone] or go to the nearest hospital emergency room if you experience any of the following: heavy, bright red blood from your nose or mouth that lasts longer than 15 minutes or coughing up or vomiting bright red blood or blood clots. GENERAL CONSIDERATIONS: A gauze dressing will be placed on your upper lip to absorb any drainage after the operation. You may need to change this several times a day.  If you do not have very much drainage, you may remove the dressing.  Remember that you may gently wipe your nose with a tissue and sniff in, but DO NOT blow your nose. Please keep all of your postoperative appointments.  Your final results after the operation will depend on proper follow-up.  The initial visit is usually 2 to 5 days after the operation.  During this visit, the remaining nasal  packing and internal septal splints will be removed.  Your nasal and sinus cavities will be cleaned.  During the second visit, your nasal and sinus cavities will be cleaned again. Have someone drive you to your first two postoperative appointments.  How you care for your nose after the operation will influence the results that you obtain.  You should follow all directions, take your medication as prescribed, and call our office [phone] with any problems or questions. You may be more comfortable sleeping with your head elevated on two pillows. Do not take any medications that we have not prescribed or recommended. WARNING SIGNS: if any of the following should occur, please call our office: Persistent fever greater than 102F. Persistent vomiting. Severe and constant pain that is not relieved by prescribed pain medication. Trauma to the nose. Rash or unusual side effects from any medicines.   Next dose of Tylenol 3pm today if needed.   Post Anesthesia Home Care Instructions  Activity: Get plenty of rest for the remainder of the day. A responsible individual must stay with you for 24 hours following the procedure.  For the next 24 hours, DO NOT: -Drive a car -Paediatric nurse -Drink alcoholic beverages -Take any medication unless instructed by your physician -Make any legal decisions or sign important papers.  Meals: Start with liquid foods such as gelatin or soup. Progress to regular foods as tolerated. Avoid greasy, spicy, heavy foods. If nausea and/or vomiting occur, drink only clear liquids until the nausea and/or vomiting subsides. Call your physician if vomiting  continues.  Special Instructions/Symptoms: Your throat may feel dry or sore from the anesthesia or the breathing tube placed in your throat during surgery. If this causes discomfort, gargle with warm salt water. The discomfort should disappear within 24 hours.

## 2021-12-07 NOTE — Op Note (Signed)

## 2021-12-07 NOTE — Anesthesia Preprocedure Evaluation (Addendum)
Anesthesia Evaluation  Patient identified by MRN, date of birth, ID band Patient awake    Reviewed: Allergy & Precautions, NPO status , Patient's Chart, lab work & pertinent test results  History of Anesthesia Complications (+) PONV and history of anesthetic complications (remote hx, has had some anestehtics without nausea)  Airway Mallampati: III  TM Distance: >3 FB Neck ROM: Limited   Comment: Limited neck extension 2/2 cervical fusions Dental  (+) Dental Advisory Given, Teeth Intact   Pulmonary COPD,  COPD inhaler, Patient abstained from smoking., former smoker,  Vocal cord dysfunction Former smoker, 33 pack year history  Took breo this AM Uses rescue inhaler once a month   Pulmonary exam normal breath sounds clear to auscultation       Cardiovascular negative cardio ROS Normal cardiovascular exam Rhythm:Regular Rate:Normal     Neuro/Psych PSYCHIATRIC DISORDERS Anxiety Depression Peripheral neuropathy B/L UE  Neuromuscular disease    GI/Hepatic Neg liver ROS, GERD  Controlled,  Endo/Other  Hypothyroidism   Renal/GU negative Renal ROS  negative genitourinary   Musculoskeletal  (+) Arthritis , Osteoarthritis,    Abdominal   Peds negative pediatric ROS (+)  Hematology negative hematology ROS (+)   Anesthesia Other Findings Deviated septum, B/L turbinate hypertrophy   Reproductive/Obstetrics negative OB ROS                            Anesthesia Physical Anesthesia Plan  ASA: 2  Anesthesia Plan: General   Post-op Pain Management: Tylenol PO (pre-op)   Induction: Intravenous  PONV Risk Score and Plan: Ondansetron, Dexamethasone and Treatment may vary due to age or medical condition  Airway Management Planned: Oral ETT and Video Laryngoscope Planned  Additional Equipment: None  Intra-op Plan:   Post-operative Plan: Extubation in OR  Informed Consent: I have reviewed the  patients History and Physical, chart, labs and discussed the procedure including the risks, benefits and alternatives for the proposed anesthesia with the patient or authorized representative who has indicated his/her understanding and acceptance.     Dental advisory given  Plan Discussed with: CRNA  Anesthesia Plan Comments:         Anesthesia Quick Evaluation

## 2021-12-07 NOTE — Transfer of Care (Signed)
Immediate Anesthesia Transfer of Care Note  Patient: Mary Hart  Procedure(s) Performed: NASAL SEPTOPLASTY WITH TURBINATE REDUCTION (Bilateral: Nose)  Patient Location: PACU  Anesthesia Type:General  Level of Consciousness: drowsy  Airway & Oxygen Therapy: Patient Spontanous Breathing and Patient connected to face mask oxygen  Post-op Assessment: Report given to RN and Post -op Vital signs reviewed and stable  Post vital signs: Reviewed and stable  Last Vitals:  Vitals Value Taken Time  BP    Temp    Pulse 78 12/07/21 1054  Resp 17 12/07/21 1054  SpO2 98 % 12/07/21 1054  Vitals shown include unvalidated device data.  Last Pain:  Vitals:   12/07/21 0838  TempSrc: Oral  PainSc: 0-No pain      Patients Stated Pain Goal: 7 (59/97/74 1423)  Complications: No notable events documented.

## 2021-12-07 NOTE — Anesthesia Procedure Notes (Signed)
Procedure Name: Intubation Date/Time: 12/07/2021 9:46 AM Performed by: Ezequiel Kayser, CRNA Pre-anesthesia Checklist: Patient identified, Emergency Drugs available, Suction available and Patient being monitored Patient Re-evaluated:Patient Re-evaluated prior to induction Oxygen Delivery Method: Circle System Utilized Preoxygenation: Pre-oxygenation with 100% oxygen Induction Type: IV induction Ventilation: Mask ventilation without difficulty Laryngoscope Size: Glidescope and 3 Grade View: Grade I Tube type: Oral Tube size: 7.0 mm Number of attempts: 1 Airway Equipment and Method: Stylet and Oral airway Placement Confirmation: ETT inserted through vocal cords under direct vision, positive ETCO2 and breath sounds checked- equal and bilateral Secured at: 21 cm Tube secured with: Tape Dental Injury: Teeth and Oropharynx as per pre-operative assessment

## 2021-12-07 NOTE — Anesthesia Postprocedure Evaluation (Signed)
Anesthesia Post Note  Patient: Mary Hart  Procedure(s) Performed: NASAL SEPTOPLASTY WITH TURBINATE REDUCTION (Bilateral: Nose)     Patient location during evaluation: PACU Anesthesia Type: General Level of consciousness: awake and alert, oriented and patient cooperative Pain management: pain level controlled Vital Signs Assessment: post-procedure vital signs reviewed and stable Respiratory status: spontaneous breathing, nonlabored ventilation and respiratory function stable Cardiovascular status: blood pressure returned to baseline and stable Postop Assessment: no apparent nausea or vomiting Anesthetic complications: no   No notable events documented.  Last Vitals:  Vitals:   12/07/21 1115 12/07/21 1130  BP: (!) 158/61 (!) 169/91  Pulse: 67 88  Resp: 14 16  Temp:  (!) 36.2 C  SpO2: 98% 98%    Last Pain:  Vitals:   12/07/21 1130  TempSrc:   PainSc: 0-No pain                 Pervis Hocking

## 2021-12-08 ENCOUNTER — Encounter (HOSPITAL_BASED_OUTPATIENT_CLINIC_OR_DEPARTMENT_OTHER): Payer: Self-pay | Admitting: Otolaryngology

## 2022-01-05 DIAGNOSIS — L508 Other urticaria: Secondary | ICD-10-CM | POA: Diagnosis not present

## 2022-01-05 DIAGNOSIS — Z6829 Body mass index (BMI) 29.0-29.9, adult: Secondary | ICD-10-CM | POA: Diagnosis not present

## 2022-01-05 DIAGNOSIS — E663 Overweight: Secondary | ICD-10-CM | POA: Diagnosis not present

## 2022-01-05 DIAGNOSIS — R300 Dysuria: Secondary | ICD-10-CM | POA: Diagnosis not present

## 2022-01-05 DIAGNOSIS — N390 Urinary tract infection, site not specified: Secondary | ICD-10-CM | POA: Diagnosis not present

## 2022-01-07 DIAGNOSIS — M13849 Other specified arthritis, unspecified hand: Secondary | ICD-10-CM | POA: Diagnosis not present

## 2022-01-07 DIAGNOSIS — M1812 Unilateral primary osteoarthritis of first carpometacarpal joint, left hand: Secondary | ICD-10-CM | POA: Diagnosis not present

## 2022-01-07 DIAGNOSIS — M19042 Primary osteoarthritis, left hand: Secondary | ICD-10-CM | POA: Diagnosis not present

## 2022-01-07 DIAGNOSIS — M19041 Primary osteoarthritis, right hand: Secondary | ICD-10-CM | POA: Diagnosis not present

## 2022-01-07 DIAGNOSIS — M1811 Unilateral primary osteoarthritis of first carpometacarpal joint, right hand: Secondary | ICD-10-CM | POA: Diagnosis not present

## 2022-01-07 DIAGNOSIS — M79644 Pain in right finger(s): Secondary | ICD-10-CM | POA: Diagnosis not present

## 2022-02-01 DIAGNOSIS — E559 Vitamin D deficiency, unspecified: Secondary | ICD-10-CM | POA: Diagnosis not present

## 2022-02-01 DIAGNOSIS — E663 Overweight: Secondary | ICD-10-CM | POA: Diagnosis not present

## 2022-02-01 DIAGNOSIS — Z6825 Body mass index (BMI) 25.0-25.9, adult: Secondary | ICD-10-CM | POA: Diagnosis not present

## 2022-02-01 DIAGNOSIS — Z0001 Encounter for general adult medical examination with abnormal findings: Secondary | ICD-10-CM | POA: Diagnosis not present

## 2022-02-01 DIAGNOSIS — F419 Anxiety disorder, unspecified: Secondary | ICD-10-CM | POA: Diagnosis not present

## 2022-02-01 DIAGNOSIS — E538 Deficiency of other specified B group vitamins: Secondary | ICD-10-CM | POA: Diagnosis not present

## 2022-02-01 DIAGNOSIS — E039 Hypothyroidism, unspecified: Secondary | ICD-10-CM | POA: Diagnosis not present

## 2022-02-01 DIAGNOSIS — Z1331 Encounter for screening for depression: Secondary | ICD-10-CM | POA: Diagnosis not present

## 2022-03-03 DIAGNOSIS — R140 Abdominal distension (gaseous): Secondary | ICD-10-CM | POA: Diagnosis not present

## 2022-04-08 DIAGNOSIS — R519 Headache, unspecified: Secondary | ICD-10-CM | POA: Diagnosis not present

## 2022-04-08 DIAGNOSIS — M9902 Segmental and somatic dysfunction of thoracic region: Secondary | ICD-10-CM | POA: Diagnosis not present

## 2022-04-08 DIAGNOSIS — M9903 Segmental and somatic dysfunction of lumbar region: Secondary | ICD-10-CM | POA: Diagnosis not present

## 2022-04-08 DIAGNOSIS — M47816 Spondylosis without myelopathy or radiculopathy, lumbar region: Secondary | ICD-10-CM | POA: Diagnosis not present

## 2022-04-08 DIAGNOSIS — M47812 Spondylosis without myelopathy or radiculopathy, cervical region: Secondary | ICD-10-CM | POA: Diagnosis not present

## 2022-04-08 DIAGNOSIS — M9901 Segmental and somatic dysfunction of cervical region: Secondary | ICD-10-CM | POA: Diagnosis not present

## 2022-04-08 DIAGNOSIS — S338XXA Sprain of other parts of lumbar spine and pelvis, initial encounter: Secondary | ICD-10-CM | POA: Diagnosis not present

## 2022-04-09 DIAGNOSIS — M9903 Segmental and somatic dysfunction of lumbar region: Secondary | ICD-10-CM | POA: Diagnosis not present

## 2022-04-09 DIAGNOSIS — M47816 Spondylosis without myelopathy or radiculopathy, lumbar region: Secondary | ICD-10-CM | POA: Diagnosis not present

## 2022-04-09 DIAGNOSIS — M9901 Segmental and somatic dysfunction of cervical region: Secondary | ICD-10-CM | POA: Diagnosis not present

## 2022-04-09 DIAGNOSIS — M47812 Spondylosis without myelopathy or radiculopathy, cervical region: Secondary | ICD-10-CM | POA: Diagnosis not present

## 2022-04-09 DIAGNOSIS — R519 Headache, unspecified: Secondary | ICD-10-CM | POA: Diagnosis not present

## 2022-04-09 DIAGNOSIS — M9902 Segmental and somatic dysfunction of thoracic region: Secondary | ICD-10-CM | POA: Diagnosis not present

## 2022-04-09 DIAGNOSIS — S338XXA Sprain of other parts of lumbar spine and pelvis, initial encounter: Secondary | ICD-10-CM | POA: Diagnosis not present

## 2022-04-13 DIAGNOSIS — M47812 Spondylosis without myelopathy or radiculopathy, cervical region: Secondary | ICD-10-CM | POA: Diagnosis not present

## 2022-04-13 DIAGNOSIS — M9902 Segmental and somatic dysfunction of thoracic region: Secondary | ICD-10-CM | POA: Diagnosis not present

## 2022-04-13 DIAGNOSIS — M9903 Segmental and somatic dysfunction of lumbar region: Secondary | ICD-10-CM | POA: Diagnosis not present

## 2022-04-13 DIAGNOSIS — S338XXA Sprain of other parts of lumbar spine and pelvis, initial encounter: Secondary | ICD-10-CM | POA: Diagnosis not present

## 2022-04-13 DIAGNOSIS — R519 Headache, unspecified: Secondary | ICD-10-CM | POA: Diagnosis not present

## 2022-04-13 DIAGNOSIS — M9901 Segmental and somatic dysfunction of cervical region: Secondary | ICD-10-CM | POA: Diagnosis not present

## 2022-04-13 DIAGNOSIS — M47816 Spondylosis without myelopathy or radiculopathy, lumbar region: Secondary | ICD-10-CM | POA: Diagnosis not present

## 2022-04-15 DIAGNOSIS — S338XXA Sprain of other parts of lumbar spine and pelvis, initial encounter: Secondary | ICD-10-CM | POA: Diagnosis not present

## 2022-04-15 DIAGNOSIS — M9901 Segmental and somatic dysfunction of cervical region: Secondary | ICD-10-CM | POA: Diagnosis not present

## 2022-04-15 DIAGNOSIS — M47812 Spondylosis without myelopathy or radiculopathy, cervical region: Secondary | ICD-10-CM | POA: Diagnosis not present

## 2022-04-15 DIAGNOSIS — M9903 Segmental and somatic dysfunction of lumbar region: Secondary | ICD-10-CM | POA: Diagnosis not present

## 2022-04-15 DIAGNOSIS — M47816 Spondylosis without myelopathy or radiculopathy, lumbar region: Secondary | ICD-10-CM | POA: Diagnosis not present

## 2022-04-15 DIAGNOSIS — R519 Headache, unspecified: Secondary | ICD-10-CM | POA: Diagnosis not present

## 2022-04-15 DIAGNOSIS — M9902 Segmental and somatic dysfunction of thoracic region: Secondary | ICD-10-CM | POA: Diagnosis not present

## 2022-04-22 DIAGNOSIS — R519 Headache, unspecified: Secondary | ICD-10-CM | POA: Diagnosis not present

## 2022-04-22 DIAGNOSIS — M47812 Spondylosis without myelopathy or radiculopathy, cervical region: Secondary | ICD-10-CM | POA: Diagnosis not present

## 2022-04-22 DIAGNOSIS — M47816 Spondylosis without myelopathy or radiculopathy, lumbar region: Secondary | ICD-10-CM | POA: Diagnosis not present

## 2022-04-22 DIAGNOSIS — M9902 Segmental and somatic dysfunction of thoracic region: Secondary | ICD-10-CM | POA: Diagnosis not present

## 2022-04-22 DIAGNOSIS — M9901 Segmental and somatic dysfunction of cervical region: Secondary | ICD-10-CM | POA: Diagnosis not present

## 2022-04-22 DIAGNOSIS — S338XXA Sprain of other parts of lumbar spine and pelvis, initial encounter: Secondary | ICD-10-CM | POA: Diagnosis not present

## 2022-04-22 DIAGNOSIS — M9903 Segmental and somatic dysfunction of lumbar region: Secondary | ICD-10-CM | POA: Diagnosis not present

## 2022-04-29 DIAGNOSIS — M9903 Segmental and somatic dysfunction of lumbar region: Secondary | ICD-10-CM | POA: Diagnosis not present

## 2022-04-29 DIAGNOSIS — M47812 Spondylosis without myelopathy or radiculopathy, cervical region: Secondary | ICD-10-CM | POA: Diagnosis not present

## 2022-04-29 DIAGNOSIS — M9902 Segmental and somatic dysfunction of thoracic region: Secondary | ICD-10-CM | POA: Diagnosis not present

## 2022-04-29 DIAGNOSIS — R519 Headache, unspecified: Secondary | ICD-10-CM | POA: Diagnosis not present

## 2022-04-29 DIAGNOSIS — M9901 Segmental and somatic dysfunction of cervical region: Secondary | ICD-10-CM | POA: Diagnosis not present

## 2022-04-29 DIAGNOSIS — S338XXA Sprain of other parts of lumbar spine and pelvis, initial encounter: Secondary | ICD-10-CM | POA: Diagnosis not present

## 2022-04-29 DIAGNOSIS — M47816 Spondylosis without myelopathy or radiculopathy, lumbar region: Secondary | ICD-10-CM | POA: Diagnosis not present

## 2022-05-06 DIAGNOSIS — S338XXA Sprain of other parts of lumbar spine and pelvis, initial encounter: Secondary | ICD-10-CM | POA: Diagnosis not present

## 2022-05-06 DIAGNOSIS — M47816 Spondylosis without myelopathy or radiculopathy, lumbar region: Secondary | ICD-10-CM | POA: Diagnosis not present

## 2022-05-06 DIAGNOSIS — M9901 Segmental and somatic dysfunction of cervical region: Secondary | ICD-10-CM | POA: Diagnosis not present

## 2022-05-06 DIAGNOSIS — R519 Headache, unspecified: Secondary | ICD-10-CM | POA: Diagnosis not present

## 2022-05-06 DIAGNOSIS — M9902 Segmental and somatic dysfunction of thoracic region: Secondary | ICD-10-CM | POA: Diagnosis not present

## 2022-05-06 DIAGNOSIS — M9903 Segmental and somatic dysfunction of lumbar region: Secondary | ICD-10-CM | POA: Diagnosis not present

## 2022-05-06 DIAGNOSIS — M47812 Spondylosis without myelopathy or radiculopathy, cervical region: Secondary | ICD-10-CM | POA: Diagnosis not present

## 2022-05-10 DIAGNOSIS — M25511 Pain in right shoulder: Secondary | ICD-10-CM | POA: Diagnosis not present

## 2022-05-10 DIAGNOSIS — M25561 Pain in right knee: Secondary | ICD-10-CM | POA: Diagnosis not present

## 2022-05-14 ENCOUNTER — Other Ambulatory Visit: Payer: Self-pay

## 2022-05-14 ENCOUNTER — Ambulatory Visit
Admission: RE | Admit: 2022-05-14 | Discharge: 2022-05-14 | Disposition: A | Discharge status: Home / Self Care | Payer: PPO | Source: Ambulatory Visit | Attending: Nurse Practitioner | Admitting: Nurse Practitioner

## 2022-05-14 VITALS — BP 162/88 | HR 70 | Temp 98.0°F | Resp 20

## 2022-05-14 DIAGNOSIS — L299 Pruritus, unspecified: Secondary | ICD-10-CM | POA: Insufficient documentation

## 2022-05-14 DIAGNOSIS — R300 Dysuria: Secondary | ICD-10-CM | POA: Diagnosis not present

## 2022-05-14 LAB — POCT URINALYSIS DIP (MANUAL ENTRY)
Bilirubin, UA: NEGATIVE
Blood, UA: NEGATIVE
Glucose, UA: NEGATIVE mg/dL
Nitrite, UA: NEGATIVE
Protein Ur, POC: NEGATIVE mg/dL
Spec Grav, UA: <=1.005 — AB (ref 1.010–1.025)
Urobilinogen, UA: 0.2 E.U./dL
pH, UA: 5.5 (ref 5.0–8.0)

## 2022-05-14 MED ORDER — PREDNISONE 20 MG PO TABS: 40.0000 mg | ORAL_TABLET | Freq: Every day | ORAL | 0 refills | Status: AC

## 2022-05-14 MED ORDER — DEXAMETHASONE SODIUM PHOSPHATE 10 MG/ML IJ SOLN
10.0000 mg | INTRAMUSCULAR | Status: AC
Start: 2022-05-14 — End: 2022-05-14
  Administered 2022-05-14: 10 mg via INTRAMUSCULAR

## 2022-05-14 NOTE — ED Provider Notes (Signed)
[facility]    CSN: [id number] Arrival date & time: 05/14/22  1449      History   Chief Complaint Chief Complaint  Patient presents with   Allergic Reaction    i started benadryl at 7 pm,  Taken every 4 hours.  Itching will not stop - Entered by patient    HPI Mary Hart is a 81 y.o. female.   The history is provided by the patient.   Patient presents for complaints of generalized itching after eating supper last night.  States that she ate boiled shrimp, cauliflower sprinkle with tumeric, and strawberries for dinner.  She states immediately after she ate her supper, she began having itching.  She states that it feels like it is "under the skin".  She states that she has also been itching so bad that her vaginal area also feels irritated.  She denies fever, chills, tongue swelling, lip swelling, throat swelling, difficulty breathing, chest tightness, urinary frequency, urgency, hesitancy, or flank pain.  She states that she has taken Benadryl since her symptoms started.  She has taken at least 10-12 Benadryl since last evening.  She denies rash, the use of new medications, soaps, detergents, or lotions.  Past Medical History:  Diagnosis Date   Anxiety    Arthritis    oa   Clostridium difficile infection 2013, 2015   Treated with vancomycin   COPD (chronic obstructive pulmonary disease) ([place])    Depression    Diverticula of colon    Genital herpes    GERD (gastroesophageal reflux disease)    Hypothyroid    IBS (irritable bowel syndrome)    Internal hemorrhoids    Jaundice    as child cause unknown   Neuropathy    fingers both hands   PONV (postoperative nausea and vomiting)    did well with last neck fusion    Recurrent colitis due to Clostridium difficile 07/03/2013   4 treatments with vancomycin since late 2013/early 2014 - responds then relapses     Vocal cord dysfunction    SYASMONIC DYSPHONIA    Patient Active Problem List   Diagnosis Date Noted    Pelvic floor weakness in female 04/14/2017   OA (osteoarthritis) of knee 12/27/2016   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 Wynelle Bourgeois external hemorrhoids   COLONOSCOPY  10/12/2012   Dr. Gala Romney- internal hemorrhoids, very early, few, sigmoid diverticula o/w normal appearing colon and terminal ileum. + cdiff on stool samples taken. Segmental bx negative.   ESOPHAGOGASTRODUODENOSCOPY  11/25/10   Rourk- tubular esophagus s/p of 56 dilator from a small HH/otherwise normal   EYE SURGERY Bilateral 2012   ioc with lens replacement for cataracts   JOINT REPLACEMENT  09/2010   Thumb left   meniscus repair right knee  2014   NASAL SEPTOPLASTY W/ TURBINOPLASTY Bilateral 12/07/2021   Procedure: NASAL SEPTOPLASTY WITH TURBINATE REDUCTION;  Surgeon: Leta Baptist, MD;  Location: [facility];  Service: ENT;  Laterality: Bilateral;   TOTAL KNEE ARTHROPLASTY Right  12/27/2016   Procedure: RIGHT TOTAL KNEE ARTHROPLASTY;  Surgeon: Gaynelle Arabian, MD;  Location: [facility];  Service: Orthopedics;  Laterality: Right;  Adductor Block   VESICOVAGINAL FISTULA CLOSURE W/ TAH      OB History     Gravida  1   Para  1   Term  1   Preterm      AB      Living         SAB      IAB      Ectopic      Multiple      Live Births               Home Medications    Prior to Admission medications   Medication Sig Start Date End Date Taking? Authorizing Provider  albuterol (VENTOLIN HFA) 108 (90 Base) MCG/ACT inhaler Inhale 1-2 puffs  into the lungs every 6 (six) hours as needed for wheezing or shortness of breath.    [provider]  ALPRAZolam Duanne Moron) 0.25 MG tablet Take 1 tablet by mouth as needed for anxiety. 11/08/18   Esterwood, Amy S, PA-C  benzonatate (TESSALON) 100 MG capsule Take 1 capsule (100 mg total) by mouth every 8 (eight) hours. 03/16/21   Faustino Congress, NP  BREO ELLIPTA 200-25 MCG/INH AEPB Inhale 1 puff into the lungs daily. 03/05/21   [provider]  cetirizine (ZYRTEC) 10 MG tablet Take 10 mg by mouth at bedtime.    [provider]  cholecalciferol (VITAMIN D3) 25 MCG (1000 UNIT) tablet Take 1,000 Units by mouth daily.    [provider]  levothyroxine (SYNTHROID) 75 MCG tablet Take 75 mcg by mouth daily before breakfast.    [provider]  linaclotide (LINZESS) 72 MCG capsule Take 1 tablet by mouth every morning. 11/08/18   Esterwood, Amy S, PA-C  ondansetron (ZOFRAN) 4 MG tablet Take 1 tablet (4 mg total) by mouth every 8 (eight) hours as needed for nausea or vomiting. Patient not taking: Reported on 03/24/2021 11/08/18   Alfredia Ferguson, PA-C    Family History Family History  Problem Relation Age of Onset   Dementia Father    Dementia Mother    Breast cancer Mother    Colon cancer Maternal Uncle    Allergic rhinitis Neg Hx    Asthma Neg Hx    Eczema Neg Hx    Urticaria Neg Hx     Social History Social History   Tobacco Use   Smoking status: Former    Packs/day: 0.66    Years: 50.00    Total pack years: 33.00    Types: Cigarettes   Smokeless tobacco: Never   Tobacco comments:    1/2 pack per day  Vaping Use   Vaping Use: Former  Substance Use Topics   Alcohol use: No   Drug use: No     Allergies   Codeine, Hydrocodone, Hydromorphone hcl, Morphine, Moxifloxacin, and Sulfonamide derivatives   Review of Systems Review of Systems Per HPI  Physical Exam Triage Vital Signs ED Triage Vitals [05/14/22 1535]  Enc Vitals Group      BP (!) 162/88     Pulse Rate 70     Resp 20     Temp 98 F (36.7 C)     Temp Source Oral     SpO2 97 %     Weight      Height      Head Circumference  Peak Flow      Pain Score 0     Pain Loc      Pain Edu?      Excl. in [place]?    No data found.  Updated Vital Signs BP (!) 162/88 (BP Location: Right Arm)   Pulse 70   Temp 98 F (36.7 C) (Oral)   Resp 20   SpO2 97%   Visual Acuity Right Eye Distance:   Left Eye Distance:   Bilateral Distance:    Right Eye Near:   Left Eye Near:    Bilateral Near:     Physical Exam Vitals and nursing note reviewed.  Constitutional:      General: She is in acute distress (Appears uncomfortable due to itching).     Appearance: Normal appearance.  HENT:     Head: Normocephalic.     Nose: Nose normal.  Eyes:     Extraocular Movements: Extraocular movements intact.     Conjunctiva/sclera: Conjunctivae normal.     Pupils: Pupils are equal, round, and reactive to light.  Cardiovascular:     Rate and Rhythm: Normal rate and regular rhythm.     Pulses: Normal pulses.     Heart sounds: Normal heart sounds.  Pulmonary:     Effort: Pulmonary effort is normal. No respiratory distress.     Breath sounds: Normal breath sounds. No stridor. No wheezing or rales.  Abdominal:     General: Bowel sounds are normal.     Palpations: Abdomen is soft.  Musculoskeletal:     Cervical back: Normal range of motion.  Skin:    General: Skin is warm and dry.  Neurological:     General: No focal deficit present.     Mental Status: She is alert and oriented to person, place, and time.  Psychiatric:        Mood and Affect: Mood normal.        Behavior: Behavior normal.      UC Treatments / Results  Labs (all labs ordered are listed, but only abnormal results are displayed) Labs Reviewed  POCT URINALYSIS DIP (MANUAL ENTRY) - Abnormal; Notable for the following components:      Result Value   Clarity, UA hazy (*)    Ketones, POC UA trace (5)  (*)    Spec Grav, UA <=1.005 (*)    Leukocytes, UA Trace (*)    All other components within normal limits    EKG   Radiology No results found.  Procedures Procedures (including critical care time)  Medications Ordered in UC Medications  dexamethasone (DECADRON) injection 10 mg (has no administration in time range)    Initial Impression / Assessment and Plan / UC Course  I have reviewed the triage vital signs and the nursing notes.  Pertinent labs & imaging results that were available during my care of the patient were reviewed by me and considered in my medical decision making (see chart for details).  Patient presents with urticaria that has been present after she ate supper last evening.  On exam, there is no obvious rash.  Patient does appear uncomfortable due to the itching.  Her lung sounds are clear throughout, vital signs are stable.  Urinalysis was performed which was negative for urinary tract infection.  We will send a urine culture for confirmation.  Decadron 10 mg IM injection given in the clinic.  Will start patient on prednisone for 5 days.  The patient was advised to take Zyrtec during the day  and Benadryl at bedtime.  Supportive care recommendations were provided to the patient.  Patient advised to follow-up as needed with strict indications of when to go to the emergency department. Final Clinical Impressions(s) / UC Diagnoses   Final diagnoses:  None   Discharge Instructions   None    ED Prescriptions   None    PDMP not reviewed this encounter.   Tish Men, NP 05/14/22 [phone]

## 2022-05-14 NOTE — Discharge Instructions (Signed)
Take medication as prescribed. May also take over-the-counter Zyrtec to help with itching.  May take Benadryl at bedtime. Avoid hot baths or showers while symptoms persist.  Recommend taking lukewarm baths. May apply cool cloths to the area to help with itching or discomfort. Go to the emergency department immediately if you develop chest tightness, shortness of breath, difficulty breathing, tongue swelling, throat swelling, or other concerns. Follow up if symptoms do not improve.

## 2022-05-14 NOTE — ED Triage Notes (Addendum)
Pt reports generalized itching since eating supper last night. Pt has taken benadryl and pepcid with some relief of itching. Pt also reports dysuria since this am and is unsure if related.

## 2022-05-16 LAB — URINE CULTURE: Culture: 30000 — AB

## 2022-05-18 DIAGNOSIS — M25511 Pain in right shoulder: Secondary | ICD-10-CM | POA: Diagnosis not present

## 2022-05-18 DIAGNOSIS — M6281 Muscle weakness (generalized): Secondary | ICD-10-CM | POA: Diagnosis not present

## 2022-05-20 DIAGNOSIS — M25511 Pain in right shoulder: Secondary | ICD-10-CM | POA: Diagnosis not present

## 2022-05-20 DIAGNOSIS — M6281 Muscle weakness (generalized): Secondary | ICD-10-CM | POA: Diagnosis not present

## 2022-05-25 DIAGNOSIS — M25511 Pain in right shoulder: Secondary | ICD-10-CM | POA: Diagnosis not present

## 2022-05-25 DIAGNOSIS — M6281 Muscle weakness (generalized): Secondary | ICD-10-CM | POA: Diagnosis not present

## 2022-05-28 DIAGNOSIS — M6281 Muscle weakness (generalized): Secondary | ICD-10-CM | POA: Diagnosis not present

## 2022-05-28 DIAGNOSIS — M25511 Pain in right shoulder: Secondary | ICD-10-CM | POA: Diagnosis not present

## 2022-06-01 DIAGNOSIS — M25511 Pain in right shoulder: Secondary | ICD-10-CM | POA: Diagnosis not present

## 2022-06-01 DIAGNOSIS — M6281 Muscle weakness (generalized): Secondary | ICD-10-CM | POA: Diagnosis not present

## 2022-06-03 DIAGNOSIS — J449 Chronic obstructive pulmonary disease, unspecified: Secondary | ICD-10-CM | POA: Diagnosis not present

## 2022-06-03 DIAGNOSIS — M6281 Muscle weakness (generalized): Secondary | ICD-10-CM | POA: Diagnosis not present

## 2022-06-03 DIAGNOSIS — M25511 Pain in right shoulder: Secondary | ICD-10-CM | POA: Diagnosis not present

## 2022-06-03 DIAGNOSIS — A6000 Herpesviral infection of urogenital system, unspecified: Secondary | ICD-10-CM | POA: Diagnosis not present

## 2022-06-03 DIAGNOSIS — Z6825 Body mass index (BMI) 25.0-25.9, adult: Secondary | ICD-10-CM | POA: Diagnosis not present

## 2022-06-08 DIAGNOSIS — M25511 Pain in right shoulder: Secondary | ICD-10-CM | POA: Diagnosis not present

## 2022-06-08 DIAGNOSIS — M6281 Muscle weakness (generalized): Secondary | ICD-10-CM | POA: Diagnosis not present

## 2022-06-16 DIAGNOSIS — M25511 Pain in right shoulder: Secondary | ICD-10-CM | POA: Diagnosis not present

## 2022-06-16 DIAGNOSIS — M6281 Muscle weakness (generalized): Secondary | ICD-10-CM | POA: Diagnosis not present

## 2022-06-23 DIAGNOSIS — M25511 Pain in right shoulder: Secondary | ICD-10-CM | POA: Diagnosis not present

## 2022-06-23 DIAGNOSIS — M6281 Muscle weakness (generalized): Secondary | ICD-10-CM | POA: Diagnosis not present

## 2022-06-25 DIAGNOSIS — M25511 Pain in right shoulder: Secondary | ICD-10-CM | POA: Diagnosis not present

## 2022-06-25 DIAGNOSIS — M6281 Muscle weakness (generalized): Secondary | ICD-10-CM | POA: Diagnosis not present

## 2022-06-30 DIAGNOSIS — M25511 Pain in right shoulder: Secondary | ICD-10-CM | POA: Diagnosis not present

## 2022-06-30 DIAGNOSIS — M6281 Muscle weakness (generalized): Secondary | ICD-10-CM | POA: Diagnosis not present

## 2022-07-02 DIAGNOSIS — M25511 Pain in right shoulder: Secondary | ICD-10-CM | POA: Diagnosis not present

## 2022-07-02 DIAGNOSIS — M6281 Muscle weakness (generalized): Secondary | ICD-10-CM | POA: Diagnosis not present

## 2022-07-05 DIAGNOSIS — M25511 Pain in right shoulder: Secondary | ICD-10-CM | POA: Diagnosis not present

## 2022-07-05 DIAGNOSIS — M6281 Muscle weakness (generalized): Secondary | ICD-10-CM | POA: Diagnosis not present

## 2022-07-05 DIAGNOSIS — Z1231 Encounter for screening mammogram for malignant neoplasm of breast: Secondary | ICD-10-CM | POA: Diagnosis not present

## 2022-07-06 DIAGNOSIS — J310 Chronic rhinitis: Secondary | ICD-10-CM | POA: Diagnosis not present

## 2022-07-06 DIAGNOSIS — J343 Hypertrophy of nasal turbinates: Secondary | ICD-10-CM | POA: Diagnosis not present

## 2022-07-08 DIAGNOSIS — M25511 Pain in right shoulder: Secondary | ICD-10-CM | POA: Diagnosis not present

## 2022-07-08 DIAGNOSIS — M6281 Muscle weakness (generalized): Secondary | ICD-10-CM | POA: Diagnosis not present

## 2022-09-01 DIAGNOSIS — E039 Hypothyroidism, unspecified: Secondary | ICD-10-CM | POA: Diagnosis not present

## 2022-09-01 DIAGNOSIS — F329 Major depressive disorder, single episode, unspecified: Secondary | ICD-10-CM | POA: Diagnosis not present

## 2022-09-02 ENCOUNTER — Ambulatory Visit: Payer: PPO | Admitting: Podiatry

## 2022-09-02 DIAGNOSIS — L84 Corns and callosities: Secondary | ICD-10-CM | POA: Diagnosis not present

## 2022-09-02 NOTE — Progress Notes (Signed)
  Subjective:  Patient ID: Mary Hart, female    DOB: 09-Nov-1940,  MRN: [id number]  Chief Complaint  Patient presents with   Callouses    left foot possible callus verses wart on left great toe -tried OTC corn remover    81 y.o. female presents with the above complaint. History confirmed with patient.  She is a painful lesion on the left great toe she has been using corn remover pads and has noticed this has helped some  Objective:  Physical Exam: warm, good capillary refill, no trophic changes or ulcerative lesions, normal DP and PT pulses, normal sensory exam, and porokeratosis sub-IPJ left hallux.  Assessment:   1. Corn of toe      Plan:  Patient was evaluated and treated and all questions answered.  All symptomatic hyperkeratoses were safely debrided with a sterile #15 blade to patient's level of comfort without incident as a courtesy. We discussed preventative and palliative care of these lesions including supportive and accommodative shoegear, padding, and use of the salicylic acid pads that she has been using.  She will continue this at home.  This was applied today as well.   Return if symptoms worsen or fail to improve.

## 2022-09-06 DIAGNOSIS — M19041 Primary osteoarthritis, right hand: Secondary | ICD-10-CM | POA: Diagnosis not present

## 2022-09-06 DIAGNOSIS — M180 Bilateral primary osteoarthritis of first carpometacarpal joints: Secondary | ICD-10-CM | POA: Diagnosis not present

## 2022-09-06 DIAGNOSIS — M1811 Unilateral primary osteoarthritis of first carpometacarpal joint, right hand: Secondary | ICD-10-CM | POA: Diagnosis not present

## 2022-09-15 DIAGNOSIS — J449 Chronic obstructive pulmonary disease, unspecified: Secondary | ICD-10-CM | POA: Diagnosis not present

## 2022-09-15 DIAGNOSIS — R059 Cough, unspecified: Secondary | ICD-10-CM | POA: Diagnosis not present

## 2022-09-15 DIAGNOSIS — Z6827 Body mass index (BMI) 27.0-27.9, adult: Secondary | ICD-10-CM | POA: Diagnosis not present

## 2022-09-15 DIAGNOSIS — E663 Overweight: Secondary | ICD-10-CM | POA: Diagnosis not present

## 2022-10-06 DIAGNOSIS — M7541 Impingement syndrome of right shoulder: Secondary | ICD-10-CM | POA: Diagnosis not present

## 2022-10-25 ENCOUNTER — Encounter: Payer: Self-pay | Admitting: Emergency Medicine

## 2022-10-25 ENCOUNTER — Ambulatory Visit
Admission: EM | Admit: 2022-10-25 | Discharge: 2022-10-25 | Disposition: A | Discharge status: Home / Self Care | Payer: PPO | Attending: Family Medicine | Admitting: Family Medicine

## 2022-10-25 DIAGNOSIS — Z8719 Personal history of other diseases of the digestive system: Secondary | ICD-10-CM

## 2022-10-25 DIAGNOSIS — R195 Other fecal abnormalities: Secondary | ICD-10-CM

## 2022-10-25 DIAGNOSIS — R1030 Lower abdominal pain, unspecified: Secondary | ICD-10-CM

## 2022-10-25 MED ORDER — CIPROFLOXACIN HCL 500 MG PO TABS: 500.0000 mg | ORAL_TABLET | Freq: Two times a day (BID) | ORAL | 0 refills | Status: DC

## 2022-10-25 MED ORDER — METRONIDAZOLE 500 MG PO TABS: 500.0000 mg | ORAL_TABLET | Freq: Three times a day (TID) | ORAL | 0 refills | Status: DC

## 2022-10-25 NOTE — ED Provider Notes (Signed)
[facility]    CSN: [id number] Arrival date & time: 10/25/22  1215      History   Chief Complaint Chief Complaint  Patient presents with   Abdominal Pain    HPI Mary Hart is a 82 y.o. female.   Patient presenting today with 2-week history of lower abdominal pain worse on the lower left that has significantly worsened the past few days, now with fever, headache, malaise, dark stools this morning.  She denies nausea, vomiting, new foods or medications, upper respiratory symptoms.  Has a history of diverticulosis and is concerned for diverticulitis.  She was constipated initially and illness, took Dulcolax with some relief and states she had frequent diarrhea the past few days.    Past Medical History:  Diagnosis Date   Anxiety    Arthritis    oa   Clostridium difficile infection 2013, 2015   Treated with vancomycin   COPD (chronic obstructive pulmonary disease) ([place])    Depression    Diverticula of colon    Genital herpes    GERD (gastroesophageal reflux disease)    Hypothyroid    IBS (irritable bowel syndrome)    Internal hemorrhoids    Jaundice    as child cause unknown   Neuropathy    fingers both hands   PONV (postoperative nausea and vomiting)    did well with last neck fusion    Recurrent colitis due to Clostridium difficile 07/03/2013   4 treatments with vancomycin since late 2013/early 2014 - responds then relapses     Vocal cord dysfunction    SYASMONIC DYSPHONIA    Patient Active Problem List   Diagnosis Date Noted   Pelvic floor weakness in female 04/14/2017   OA (osteoarthritis) of knee 12/27/2016   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 Wynelle Bourgeois external hemorrhoids   COLONOSCOPY  10/12/2012   Dr. Gala Romney- internal hemorrhoids, very early, few, sigmoid diverticula o/w normal appearing colon and terminal ileum. + cdiff on stool samples taken. Segmental bx negative.   ESOPHAGOGASTRODUODENOSCOPY  11/25/10   Rourk- tubular esophagus s/p of 56 dilator from a small HH/otherwise normal   EYE SURGERY Bilateral 2012   ioc with lens replacement for cataracts   JOINT REPLACEMENT  09/2010   Thumb left   meniscus repair right knee  2014   NASAL SEPTOPLASTY W/ TURBINOPLASTY Bilateral 12/07/2021   Procedure: NASAL SEPTOPLASTY WITH TURBINATE REDUCTION;  Surgeon: Leta Baptist, MD;  Location: [facility];  Service: ENT;  Laterality: Bilateral;   TOTAL KNEE ARTHROPLASTY Right 12/27/2016   Procedure: RIGHT TOTAL KNEE ARTHROPLASTY;  Surgeon: Gaynelle Arabian, MD;  Location: [facility];  Service: Orthopedics;  Laterality: Right;  Adductor Block   VESICOVAGINAL FISTULA CLOSURE W/ TAH      OB History     Gravida  1   Para  1   Term  1   Preterm      AB      Living         SAB  IAB      Ectopic      Multiple      Live Births               Home Medications    Prior to Admission medications   Medication Sig Start Date End Date Taking? Authorizing Provider  ciprofloxacin (CIPRO) 500 MG tablet Take 1 tablet (500 mg total) by mouth 2 (two) times daily. 10/25/22  Yes Volney American, PA-C  metroNIDAZOLE (FLAGYL) 500 MG tablet Take 1 tablet (500 mg total) by mouth 3 (three) times daily for 7 days. 10/25/22 11/01/22 Yes Volney American, PA-C  albuterol (VENTOLIN HFA) 108 (90 Base) MCG/ACT inhaler Inhale 1-2 puffs into the lungs every 6 (six) hours as needed for wheezing or shortness of breath.    [provider]  ALPRAZolam Duanne Moron)  0.25 MG tablet Take 1 tablet by mouth as needed for anxiety. 11/08/18   Esterwood, Amy S, PA-C  benzonatate (TESSALON) 100 MG capsule Take 1 capsule (100 mg total) by mouth every 8 (eight) hours. 03/16/21   Faustino Congress, NP  BREO ELLIPTA 200-25 MCG/INH AEPB Inhale 1 puff into the lungs daily. 03/05/21   [provider]  cetirizine (ZYRTEC) 10 MG tablet Take 10 mg by mouth at bedtime.    [provider]  cholecalciferol (VITAMIN D3) 25 MCG (1000 UNIT) tablet Take 1,000 Units by mouth daily.    [provider]  levothyroxine (SYNTHROID) 75 MCG tablet Take 75 mcg by mouth daily before breakfast.    [provider]  linaclotide (LINZESS) 72 MCG capsule Take 1 tablet by mouth every morning. 11/08/18   Esterwood, Amy S, PA-C  ondansetron (ZOFRAN) 4 MG tablet Take 1 tablet (4 mg total) by mouth every 8 (eight) hours as needed for nausea or vomiting. Patient not taking: Reported on 03/24/2021 11/08/18   Alfredia Ferguson, PA-C    Family History Family History  Problem Relation Age of Onset   Dementia Father    Dementia Mother    Breast cancer Mother    Colon cancer Maternal Uncle    Allergic rhinitis Neg Hx    Asthma Neg Hx    Eczema Neg Hx    Urticaria Neg Hx     Social History Social History   Tobacco Use   Smoking status: Former    Packs/day: 0.66    Years: 50.00    Total pack years: 33.00    Types: Cigarettes   Smokeless tobacco: Never   Tobacco comments:    1/2 pack per day  Vaping Use   Vaping Use: Former  Substance Use Topics   Alcohol use: No   Drug use: No     Allergies   Codeine, Hydrocodone, Hydromorphone hcl, Morphine, Moxifloxacin, and Sulfonamide derivatives   Review of Systems Review of Systems PER HPI  Physical Exam Triage Vital Signs ED Triage Vitals [10/25/22 1501]  Enc Vitals Group     BP (!) 145/84     Pulse Rate 79     Resp 18     Temp 97.7 F (36.5 C)     Temp Source Oral     SpO2 93 %     Weight       Height      Head Circumference      Peak Flow      Pain Score 4     Pain Loc      Pain Edu?      Excl. in [place]?  No data found.  Updated Vital Signs BP (!) 145/84 (BP Location: Left Arm)   Pulse 79   Temp 97.7 F (36.5 C) (Oral)   Resp 18   SpO2 93%   Visual Acuity Right Eye Distance:   Left Eye Distance:   Bilateral Distance:    Right Eye Near:   Left Eye Near:    Bilateral Near:     Physical Exam Vitals and nursing note reviewed.  Constitutional:      Appearance: Normal appearance. She is not ill-appearing.  HENT:     Head: Atraumatic.     Mouth/Throat:     Mouth: Mucous membranes are moist.  Eyes:     Extraocular Movements: Extraocular movements intact.     Conjunctiva/sclera: Conjunctivae normal.  Cardiovascular:     Rate and Rhythm: Normal rate and regular rhythm.     Heart sounds: Normal heart sounds.  Pulmonary:     Effort: Pulmonary effort is normal.     Breath sounds: Normal breath sounds.  Abdominal:     General: Bowel sounds are normal. There is no distension.     Palpations: Abdomen is soft. There is no mass.     Tenderness: There is abdominal tenderness. There is guarding. There is no rebound.     Comments: Very mild guarding to the left lower quadrant, tender to palpation to the right lower and left lower quadrant significantly worse in the left lower  Musculoskeletal:        General: Normal range of motion.     Cervical back: Normal range of motion and neck supple.  Skin:    General: Skin is warm and dry.  Neurological:     Mental Status: She is alert and oriented to person, place, and time.     Motor: No weakness.     Gait: Gait normal.  Psychiatric:        Mood and Affect: Mood normal.        Thought Content: Thought content normal.        Judgment: Judgment normal.      UC Treatments / Results  Labs (all labs ordered are listed, but only abnormal results are displayed) Labs Reviewed  COMPREHENSIVE METABOLIC PANEL  CBC WITH  DIFFERENTIAL/PLATELET  LIPASE    EKG   Radiology No results found.  Procedures Procedures (including critical care time)  Medications Ordered in UC Medications - No data to display  Initial Impression / Assessment and Plan / UC Course  I have reviewed the triage vital signs and the nursing notes.  Pertinent labs & imaging results that were available during my care of the patient were reviewed by me and considered in my medical decision making (see chart for details).     Exam and symptoms are concerning for diverticulitis today, labs pending, treat with Cipro and Flagyl regimen, probiotics, bland foods while awaiting results.  Patient aware to go to the emergency department for worsening symptoms at any time.  Final Clinical Impressions(s) / UC Diagnoses   Final diagnoses:  Lower abdominal pain  History of diverticulosis  Dark stools   Discharge Instructions   None    ED Prescriptions     Medication Sig Dispense Auth. Provider   ciprofloxacin (CIPRO) 500 MG tablet Take 1 tablet (500 mg total) by mouth 2 (two) times daily. 14 tablet Volney American, [place]   metroNIDAZOLE (FLAGYL) 500 MG tablet Take 1 tablet (500 mg total) by mouth 3 (three) times daily for 7 days. 21  tablet Volney American, [place]      PDMP not reviewed this encounter.   Volney American, [place] 10/25/22 [phone]

## 2022-10-25 NOTE — ED Triage Notes (Signed)
Pt reports feeling bad on Saturday with a headache and fever yesterday. Took a covid test and it was neg.   Also endorses abdominal pain off and on for 2 weeks and has been more constant the past 4 days. States this morning her stool was brown/black. Hx of Diverticulosis.

## 2022-10-27 LAB — CBC WITH DIFFERENTIAL/PLATELET
Basophils Absolute: 0.1 10*3/uL (ref 0.0–0.2)
Basos: 1 %
EOS (ABSOLUTE): 0.3 10*3/uL (ref 0.0–0.4)
Eos: 4 %
Hematocrit: 43.2 % (ref 34.0–46.6)
Hemoglobin: 14.7 g/dL (ref 11.1–15.9)
Immature Grans (Abs): 0.1 10*3/uL (ref 0.0–0.1)
Immature Granulocytes: 1 %
Lymphocytes Absolute: 1.7 10*3/uL (ref 0.7–3.1)
Lymphs: 24 %
MCH: 30.4 pg (ref 26.6–33.0)
MCHC: 34 g/dL (ref 31.5–35.7)
MCV: 89 fL (ref 79–97)
Monocytes Absolute: 0.8 10*3/uL (ref 0.1–0.9)
Monocytes: 12 %
Neutrophils Absolute: 4.2 10*3/uL (ref 1.4–7.0)
Neutrophils: 58 %
Platelets: 305 10*3/uL (ref 150–450)
RBC: 4.83 x10E6/uL (ref 3.77–5.28)
RDW: 13.4 % (ref 11.7–15.4)
WBC: 7.1 10*3/uL (ref 3.4–10.8)

## 2022-10-27 LAB — COMPREHENSIVE METABOLIC PANEL
ALT: 14 IU/L (ref 0–32)
AST: 15 IU/L (ref 0–40)
Albumin/Globulin Ratio: 1.6 (ref 1.2–2.2)
Albumin: 3.8 g/dL (ref 3.8–4.8)
Alkaline Phosphatase: 114 IU/L (ref 44–121)
BUN/Creatinine Ratio: 12 (ref 12–28)
BUN: 12 mg/dL (ref 8–27)
Bilirubin Total: <0.2 mg/dL (ref 0.0–1.2)
CO2: 22 mmol/L (ref 20–29)
Calcium: 8.8 mg/dL (ref 8.7–10.3)
Chloride: 100 mmol/L (ref 96–106)
Creatinine, Ser: 0.97 mg/dL (ref 0.57–1.00)
Globulin, Total: 2.4 g/dL (ref 1.5–4.5)
Glucose: 85 mg/dL (ref 70–99)
Potassium: 3.9 mmol/L (ref 3.5–5.2)
Sodium: 138 mmol/L (ref 134–144)
Total Protein: 6.2 g/dL (ref 6.0–8.5)
eGFR: 59 mL/min/{1.73_m2} — ABNORMAL LOW (ref >59)

## 2022-10-27 LAB — LIPASE: Lipase: 25 U/L (ref 14–85)

## 2022-10-28 ENCOUNTER — Emergency Department (HOSPITAL_COMMUNITY): Payer: PPO

## 2022-10-28 ENCOUNTER — Emergency Department (HOSPITAL_COMMUNITY)
Admission: EM | Admit: 2022-10-28 | Discharge: 2022-10-28 | Disposition: A | Discharge status: Home / Self Care | Payer: PPO | Attending: Emergency Medicine | Admitting: Emergency Medicine

## 2022-10-28 ENCOUNTER — Encounter (HOSPITAL_COMMUNITY): Payer: Self-pay | Admitting: Emergency Medicine

## 2022-10-28 ENCOUNTER — Other Ambulatory Visit: Payer: Self-pay

## 2022-10-28 DIAGNOSIS — R1032 Left lower quadrant pain: Secondary | ICD-10-CM | POA: Diagnosis not present

## 2022-10-28 DIAGNOSIS — N9489 Other specified conditions associated with female genital organs and menstrual cycle: Secondary | ICD-10-CM

## 2022-10-28 DIAGNOSIS — R1909 Other intra-abdominal and pelvic swelling, mass and lump: Secondary | ICD-10-CM | POA: Diagnosis not present

## 2022-10-28 DIAGNOSIS — J181 Lobar pneumonia, unspecified organism: Secondary | ICD-10-CM | POA: Insufficient documentation

## 2022-10-28 DIAGNOSIS — N838 Other noninflammatory disorders of ovary, fallopian tube and broad ligament: Secondary | ICD-10-CM | POA: Diagnosis not present

## 2022-10-28 DIAGNOSIS — J984 Other disorders of lung: Secondary | ICD-10-CM | POA: Diagnosis not present

## 2022-10-28 DIAGNOSIS — Z7951 Long term (current) use of inhaled steroids: Secondary | ICD-10-CM | POA: Diagnosis not present

## 2022-10-28 DIAGNOSIS — K5732 Diverticulitis of large intestine without perforation or abscess without bleeding: Secondary | ICD-10-CM | POA: Diagnosis not present

## 2022-10-28 DIAGNOSIS — J189 Pneumonia, unspecified organism: Secondary | ICD-10-CM

## 2022-10-28 DIAGNOSIS — R1030 Lower abdominal pain, unspecified: Secondary | ICD-10-CM

## 2022-10-28 DIAGNOSIS — K5730 Diverticulosis of large intestine without perforation or abscess without bleeding: Secondary | ICD-10-CM | POA: Diagnosis not present

## 2022-10-28 DIAGNOSIS — I700 Atherosclerosis of aorta: Secondary | ICD-10-CM | POA: Diagnosis not present

## 2022-10-28 DIAGNOSIS — R918 Other nonspecific abnormal finding of lung field: Secondary | ICD-10-CM | POA: Diagnosis not present

## 2022-10-28 LAB — URINALYSIS, ROUTINE W REFLEX MICROSCOPIC
Bacteria, UA: NONE SEEN
Bilirubin Urine: NEGATIVE
Glucose, UA: NEGATIVE mg/dL
Hgb urine dipstick: NEGATIVE
Ketones, ur: 5 mg/dL — AB
Nitrite: NEGATIVE
Protein, ur: NEGATIVE mg/dL
Specific Gravity, Urine: >1.046 — ABNORMAL HIGH (ref 1.005–1.030)
pH: 5 (ref 5.0–8.0)

## 2022-10-28 LAB — COMPREHENSIVE METABOLIC PANEL
ALT: 18 U/L (ref 0–44)
AST: 22 U/L (ref 15–41)
Albumin: 3.3 g/dL — ABNORMAL LOW (ref 3.5–5.0)
Alkaline Phosphatase: 82 U/L (ref 38–126)
Anion gap: 9 (ref 5–15)
BUN: 12 mg/dL (ref 8–23)
CO2: 24 mmol/L (ref 22–32)
Calcium: 8.5 mg/dL — ABNORMAL LOW (ref 8.9–10.3)
Chloride: 103 mmol/L (ref 98–111)
Creatinine, Ser: 1.06 mg/dL — ABNORMAL HIGH (ref 0.44–1.00)
GFR, Estimated: 53 mL/min — ABNORMAL LOW (ref >60)
Glucose, Bld: 95 mg/dL (ref 70–99)
Potassium: 4.2 mmol/L (ref 3.5–5.1)
Sodium: 136 mmol/L (ref 135–145)
Total Bilirubin: 0.5 mg/dL (ref 0.3–1.2)
Total Protein: 6.6 g/dL (ref 6.5–8.1)

## 2022-10-28 LAB — CBC WITH DIFFERENTIAL/PLATELET
Abs Immature Granulocytes: 0.03 10*3/uL (ref 0.00–0.07)
Basophils Absolute: 0 10*3/uL (ref 0.0–0.1)
Basophils Relative: 1 %
Eosinophils Absolute: 0.4 10*3/uL (ref 0.0–0.5)
Eosinophils Relative: 6 %
HCT: 43.4 % (ref 36.0–46.0)
Hemoglobin: 14.2 g/dL (ref 12.0–15.0)
Immature Granulocytes: 1 %
Lymphocytes Relative: 19 %
Lymphs Abs: 1.3 10*3/uL (ref 0.7–4.0)
MCH: 31.1 pg (ref 26.0–34.0)
MCHC: 32.7 g/dL (ref 30.0–36.0)
MCV: 95.2 fL (ref 80.0–100.0)
Monocytes Absolute: 0.8 10*3/uL (ref 0.1–1.0)
Monocytes Relative: 13 %
Neutro Abs: 4 10*3/uL (ref 1.7–7.7)
Neutrophils Relative %: 60 %
Platelets: 320 10*3/uL (ref 150–400)
RBC: 4.56 MIL/uL (ref 3.87–5.11)
RDW: 15.2 % (ref 11.5–15.5)
WBC: 6.6 10*3/uL (ref 4.0–10.5)
nRBC: 0 % (ref 0.0–0.2)

## 2022-10-28 LAB — LIPASE, BLOOD: Lipase: 30 U/L (ref 11–51)

## 2022-10-28 MED ORDER — AMOXICILLIN-POT CLAVULANATE 875-125 MG PO TABS: 1.0000 | ORAL_TABLET | Freq: Two times a day (BID) | ORAL | 0 refills | Status: DC

## 2022-10-28 MED ORDER — IOHEXOL 350 MG/ML SOLN
80.0000 mL | Freq: Once | INTRAVENOUS | Status: AC | PRN
  Administered 2022-10-28: 80 mL via INTRAVENOUS

## 2022-10-28 MED ORDER — SODIUM CHLORIDE 0.9 % IV BOLUS
500.0000 mL | Freq: Once | INTRAVENOUS | Status: AC
  Administered 2022-10-28: 500 mL via INTRAVENOUS

## 2022-10-28 MED ORDER — ONDANSETRON HCL 4 MG/2ML IJ SOLN
4.0000 mg | Freq: Once | INTRAMUSCULAR | Status: AC
  Administered 2022-10-28: 4 mg via INTRAVENOUS
  Filled 2022-10-28: qty 2

## 2022-10-28 NOTE — ED Provider Triage Note (Signed)
Emergency Medicine Provider Triage Evaluation Note  Mary Hart , a 82 y.o. female  was evaluated in triage.  Pt complains of stomach problems x 1+ week, seen at [facility] Monday. Constipated, lower abdominal [place]. Stool black and tar like on Monday, told could be diverticulosis. Started on Cipro and Flagyl, now nauseous. Did a tele visit. Continues to be constipated, no further bloody stools.   Review of Systems  Positive: As above Negative: As above  Physical Exam  There were no vitals taken for this visit. Gen:   Awake, no distress   Resp:  Normal effort  MSK:   Moves extremities without difficulty  Other:  Abdomen soft and tender  Medical Decision Making  Medically screening exam initiated at 2:07 PM.  Appropriate orders placed.  Devon D Gorelik was informed that the remainder of the evaluation will be completed by another provider, this initial triage assessment does not replace that evaluation, and the importance of remaining in the ED until their evaluation is complete.     Tacy Learn, PA-C 10/28/22 1410

## 2022-10-28 NOTE — Discharge Instructions (Addendum)
You are seen in the emergency department for low abdominal pain possibly diverticulitis.  Your CAT scan did not show any evidence of diverticulitis but you did have an adnexal mass that we will need an ultrasound.  Ultrasound is not available tonight but we her setting you up for an ultrasound for tomorrow morning.  We are changing her antibiotics to Augmentin.  Your chest x-ray showed possible signs of pneumonia.  You can stop the ciprofloxacin and metronidazole for now.  Please follow-up with your regular doctor and return to the emergency department if any worsening or concerning symptoms.  Call central scheduling tomorrow at [phone] to schedule an appointment for your ultrasound.

## 2022-10-28 NOTE — ED Provider Notes (Signed)
[facility] EMERGENCY DEPARTMENT Provider Note   CSN: [id number] Arrival date & time: 10/28/22  1325     History  Chief Complaint  Patient presents with   Abdominal Pain    Mary Hart is a 82 y.o. female.  She is complaining with some lower abdominal pain it has been going on for about a week.  She said there was 1 episode of a dark bowel movement after she had been constipated for a few days.  She has not had further episodes of dark bowel movements.  She has had nausea but no vomiting.  She went to urgent care and told him she had a history of diverticulosis they put her on Cipro and Flagyl for possible diverticulitis.  She does not feel any better after being on antibiotics for the last 4 days.  She thinks she had a low-grade fever at the beginning.  No dysuria but is having trouble passing urine.  Prior history of cholecystectomy and total hysterectomy.  The history is provided by the patient.  Abdominal Pain Pain location:  RLQ and LLQ Pain quality: aching   Pain severity:  Moderate Onset quality:  Gradual Duration:  1 week Timing:  Constant Progression:  Worsening Chronicity:  New Context: not trauma   Relieved by:  Nothing Worsened by:  Nothing Ineffective treatments: antibiotics. Associated symptoms: constipation, fever, melena and nausea   Associated symptoms: no chest pain, no chills, no cough, no dysuria, no hematemesis, no hematochezia, no hematuria and no vomiting        Home Medications Prior to Admission medications   Medication Sig Start Date End Date Taking? Authorizing Provider  albuterol (VENTOLIN HFA) 108 (90 Base) MCG/ACT inhaler Inhale 1-2 puffs into the lungs every 6 (six) hours as needed for wheezing or shortness of breath.    [provider]  ALPRAZolam Duanne Moron) 0.25 MG tablet Take 1 tablet by mouth as needed for anxiety. 11/08/18   Esterwood, Amy S, PA-C  benzonatate (TESSALON) 100 MG capsule Take 1 capsule (100 mg total) by mouth every  8 (eight) hours. 03/16/21   Faustino Congress, NP  BREO ELLIPTA 200-25 MCG/INH AEPB Inhale 1 puff into the lungs daily. 03/05/21   [provider]  cetirizine (ZYRTEC) 10 MG tablet Take 10 mg by mouth at bedtime.    [provider]  cholecalciferol (VITAMIN D3) 25 MCG (1000 UNIT) tablet Take 1,000 Units by mouth daily.    [provider]  ciprofloxacin (CIPRO) 500 MG tablet Take 1 tablet (500 mg total) by mouth 2 (two) times daily. 10/25/22   Volney American, PA-C  levothyroxine (SYNTHROID) 75 MCG tablet Take 75 mcg by mouth daily before breakfast.    [provider]  linaclotide (LINZESS) 72 MCG capsule Take 1 tablet by mouth every morning. 11/08/18   Esterwood, Amy S, PA-C  metroNIDAZOLE (FLAGYL) 500 MG tablet Take 1 tablet (500 mg total) by mouth 3 (three) times daily for 7 days. 10/25/22 11/01/22  Volney American, PA-C  ondansetron (ZOFRAN) 4 MG tablet Take 1 tablet (4 mg total) by mouth every 8 (eight) hours as needed for nausea or vomiting. Patient not taking: Reported on 03/24/2021 11/08/18   Esterwood, Amy S, PA-C      Allergies    Codeine, Hydrocodone, Hydromorphone hcl, Morphine, Moxifloxacin, and Sulfonamide derivatives    Review of Systems   Review of Systems  Constitutional:  Positive for fever. Negative for chills.  Respiratory:  Negative for cough.   Cardiovascular:  Negative for chest pain.  Gastrointestinal:  Positive for abdominal pain, constipation, melena and nausea. Negative for hematemesis, hematochezia and vomiting.  Genitourinary:  Negative for dysuria and hematuria.    Physical Exam Updated Vital Signs BP 138/84   Pulse 89   Temp 98.2 F (36.8 C) (Oral)   Resp 18   Ht '5\' 2"'$  (1.575 m)   Wt 68.9 kg   SpO2 99%   BMI 27.80 kg/m  Physical Exam Vitals and nursing note reviewed.  Constitutional:      General: She is not in acute distress.    Appearance: Normal appearance. She is well-developed.  HENT:     Head:  Normocephalic and atraumatic.  Eyes:     Conjunctiva/sclera: Conjunctivae normal.  Cardiovascular:     Rate and Rhythm: Normal rate and regular rhythm.     Heart sounds: No murmur heard. Pulmonary:     Effort: Pulmonary effort is normal. No respiratory distress.     Breath sounds: Normal breath sounds.  Abdominal:     Palpations: Abdomen is soft.     Tenderness: There is no abdominal tenderness. There is no guarding or rebound.  Musculoskeletal:        General: No deformity.     Cervical back: Neck supple.  Skin:    General: Skin is warm and dry.     Capillary Refill: Capillary refill takes less than 2 seconds.  Neurological:     General: No focal deficit present.     Mental Status: She is alert.     Sensory: No sensory deficit.     Motor: No weakness.     ED Results / Procedures / Treatments   Labs (all labs ordered are listed, but only abnormal results are displayed) Labs Reviewed  COMPREHENSIVE METABOLIC PANEL - Abnormal; Notable for the following components:      Result Value   Creatinine, Ser 1.06 (*)    Calcium 8.5 (*)    Albumin 3.3 (*)    GFR, Estimated 53 (*)    All other components within normal limits  URINALYSIS, ROUTINE W REFLEX MICROSCOPIC - Abnormal; Notable for the following components:   Specific Gravity, Urine >1.046 (*)    Ketones, ur 5 (*)    Leukocytes,Ua TRACE (*)    All other components within normal limits  CBC WITH DIFFERENTIAL/PLATELET  LIPASE, BLOOD    EKG None  Radiology DG Chest 2 View  Result Date: 10/28/2022 CLINICAL DATA:  Abnormal lung bases on abdominal CT. EXAM: CHEST - 2 VIEW COMPARISON:  Lung bases from abdominal CT earlier today. Chest radiograph and CT 03/24/2021 FINDINGS: Patchy bibasilar airspace disease, left greater than right. No focal airspace disease in the upper lung zones. Normal heart size with stable mediastinal contours. Atherosclerosis of the thoracic aorta. No pleural fluid or pneumothorax. Left greater than right  apical pleuroparenchymal scarring. IMPRESSION: Patchy bibasilar airspace disease, left greater than right, suspicious for pneumonia. Electronically Signed   By: Keith Rake M.D.   On: 10/28/2022 22:04   CT Abdomen Pelvis W Contrast  Result Date: 10/28/2022 CLINICAL DATA:  LLQ abdominal pain LLQ pain, dark stool and nausea Hx of C diff and diverticulitis EXAM: CT ABDOMEN AND PELVIS WITH CONTRAST TECHNIQUE: Multidetector CT imaging of the abdomen and pelvis was performed using the standard protocol following bolus administration of intravenous contrast. RADIATION DOSE REDUCTION: This exam was performed according to the departmental dose-optimization program which includes automated exposure control, adjustment of the mA and/or kV according to patient  size and/or use of iterative reconstruction technique. CONTRAST:  49m OMNIPAQUE IOHEXOL 350 MG/ML SOLN COMPARISON:  CT abdomen pelvis 02/18/2017 FINDINGS: Lower chest: Patchy bibasilar airspace opacities. Hepatobiliary: No focal liver abnormality. Status post cholecystectomy. No intrahepatic biliary dilatation. Common bile duct is enlarged measuring up to 16 cm which can be seen in the post cholecystectomy setting. Pancreas: No focal lesion. Normal pancreatic contour. No surrounding inflammatory changes. No main pancreatic ductal dilatation. Spleen: Normal in size without focal abnormality.  Splenule noted. Adrenals/Urinary Tract: No adrenal nodule bilaterally. Bilateral kidneys enhance symmetrically. No hydronephrosis. No hydroureter. The urinary bladder is unremarkable. On delayed imaging, there is no urothelial wall thickening and there are no filling defects in the opacified portions of the bilateral collecting systems or ureters. Stomach/Bowel: Stomach is within normal limits. No evidence of bowel wall thickening or dilatation. Colonic diverticulosis. Appendix appears normal. Vascular/Lymphatic: No abdominal aorta or iliac aneurysm. Severe atherosclerotic  plaque of the aorta and its branches. No abdominal, pelvic, or inguinal lymphadenopathy. Reproductive: Status post hysterectomy. Interval increase in size of a 6.3 x 5 cm (from 2.9 x 2.5 cm) fluid density lesion within the right adnexal region. Other: Slightly more conspicuous stranding of a locule of fat along the right pelvic side wall suggestive of fat necrosis or epiploic appendagitis (2:56). Finding seen on prior study but not as conspicuously. No intraperitoneal free fluid. No intraperitoneal free gas. No organized fluid collection. Musculoskeletal: Tiny fat containing umbilical hernia. No suspicious lytic or blastic osseous lesions. No acute displaced fracture. Multilevel degenerative changes of the spine with grade 1 anterolisthesis of L3 on L4. IMPRESSION: 1. Patchy bibasilar airspace opacities. Findings suggestive of infection/inflammation. Recommend further evaluation with PA and lateral view chest x-ray. 2. Interval increase in size of a 6.3 x 5 cm (from 2.9 x 2.5 cm) fluid density lesion within the right adnexal region. Recommend pelvic ultrasound for further evaluation. Consider gynecologic consultation. 3. Colonic diverticulosis with no acute diverticulitis. 4.  Aortic Atherosclerosis (ICD10-I70.0). Electronically Signed   By: MIven FinnM.D.   On: 10/28/2022 21:18    Procedures Procedures    Medications Ordered in ED Medications  sodium chloride 0.9 % bolus 500 mL (0 mLs Intravenous Stopped 10/28/22 2231)  ondansetron (ZOFRAN) injection 4 mg (4 mg Intravenous Given 10/28/22 2042)  iohexol (OMNIPAQUE) 350 MG/ML injection 80 mL (80 mLs Intravenous Contrast Given 10/28/22 2053)    ED Course/ Medical Decision Making/ A&P Clinical Course as of 10/29/22 1001  Thu Oct 28, 2022  2034 Patient was offered pain medicine and she declines any. [MB]  2223 I reviewed the results of her imaging with her.  Ultrasound is not available at this time but she is comfortable having it done tomorrow.  She  has had some respiratory symptoms and so she is agreeable to start on some medication for that.  She would like to stop the Cipro and Flagyl and I think it is reasonable at this time. [MB]    Clinical Course User Index [MB] BHayden Rasmussen MD                           Medical Decision Making Amount and/or Complexity of Data Reviewed Radiology: ordered.  Risk Prescription drug management.   This patient complains of lower abdominal pain; this involves an extensive number of treatment Options and is a complaint that carries with it a high risk of complications and morbidity. The differential includes diverticulitis, colitis, obstruction, perforation,  UTI  I ordered, reviewed and interpreted labs, which included CBC with normal white count normal hemoglobin, chemistries and LFTs fairly unremarkable, urinalysis without signs of infection I ordered medication IV fluids and reviewed PMP when indicated. I ordered imaging studies which included chest x-ray, CT abdomen and pelvis and I independently    visualized and interpreted imaging which showed no evidence of diverticulitis.  Possible lower lobe pneumonia.  Also has adnexal mass that will need an ultrasound Previous records obtained and reviewed in epic including recent urgent care visit Cardiac monitoring reviewed, normal sinus rhythm Social determinants considered, no significant barriers Critical Interventions: None  After the interventions stated above, I reevaluated the patient and found patient to be fairly well-appearing in no distress Admission and further testing considered, she will need follow-up ultrasound which unfortunately is not available at this time.  Have ordered for tomorrow morning and patient is comfortable with this plan for discharge.  She does not wish any pain medication.  Due to her pneumonia will cover with Augmentin.  Recommended close follow-up with PCP.  Return instructions discussed         Final  Clinical Impression(s) / ED Diagnoses Final diagnoses:  Lower abdominal pain  Adnexal mass  Community acquired pneumonia of left lower lobe of lung    Rx / DC Orders ED Discharge Orders          Ordered    US PELVIC COMPLETE WITH TRANSVAGINAL  Status:  Canceled        10/28/22 2225    amoxicillin-clavulanate (AUGMENTIN) 875-125 MG tablet  Every 12 hours        10/28/22 2225    US PELVIC COMPLETE WITH TRANSVAGINAL        10/28/22 2225              Hayden Rasmussen, MD 10/29/22 1004

## 2022-10-28 NOTE — ED Triage Notes (Signed)
Pt at urgen care 3 days ago for same. Pt c/o lower abd right and left with bloating and gas for over a week. Had black bm 3 days ago. Was put on cipro for possible diverticulitis. C/o nausea but no vomiting NAD. Color wnl.

## 2022-10-29 ENCOUNTER — Ambulatory Visit (HOSPITAL_COMMUNITY)
Admission: RE | Admit: 2022-10-29 | Discharge: 2022-10-29 | Disposition: A | Discharge status: Home / Self Care | Payer: PPO | Source: Ambulatory Visit | Attending: Emergency Medicine | Admitting: Emergency Medicine

## 2022-10-29 DIAGNOSIS — R1030 Lower abdominal pain, unspecified: Secondary | ICD-10-CM | POA: Diagnosis not present

## 2022-10-29 DIAGNOSIS — N838 Other noninflammatory disorders of ovary, fallopian tube and broad ligament: Secondary | ICD-10-CM | POA: Diagnosis not present

## 2022-10-29 DIAGNOSIS — N9489 Other specified conditions associated with female genital organs and menstrual cycle: Secondary | ICD-10-CM

## 2022-10-29 DIAGNOSIS — Z90710 Acquired absence of both cervix and uterus: Secondary | ICD-10-CM | POA: Diagnosis not present

## 2022-10-29 NOTE — ED Provider Notes (Signed)
Patient return to hospital for outpatient u/s.    U/s shows 6 cm right sided cystic struture.  Pt informed of need to f/u with pcp/gyn doctor in the next few weeks for further evaluation, exclude malignancy.   No current abd pain or nv.   Return precautions provided (fever, worsening or severe pain, etc.)     Lajean Saver, MD 10/29/22 1122

## 2022-11-05 ENCOUNTER — Encounter: Payer: Self-pay | Admitting: Pulmonary Disease

## 2022-11-05 ENCOUNTER — Ambulatory Visit: Payer: PPO | Admitting: Pulmonary Disease

## 2022-11-05 VITALS — BP 120/68 | HR 94 | Ht 62.0 in | Wt 154.0 lb

## 2022-11-05 DIAGNOSIS — R0602 Shortness of breath: Secondary | ICD-10-CM | POA: Diagnosis not present

## 2022-11-05 MED ORDER — TRELEGY ELLIPTA 200-62.5-25 MCG/ACT IN AEPB: 2.0000 | INHALATION_SPRAY | Freq: Every day | RESPIRATORY_TRACT | 0 refills | Status: DC

## 2022-11-05 MED ORDER — PREDNISONE 10 MG PO TABS: 20.0000 mg | ORAL_TABLET | Freq: Every day | ORAL | 0 refills | Status: DC

## 2022-11-05 NOTE — Patient Instructions (Signed)
I will see you back in about 2 months  Prescription for prednisone sent into pharmacy for you take 20 mg daily until you start feeling better then cut it down to 10 mg daily, stop at about 10 to 14 days  Sample of Trelegy to try in place of Breo, if this works better for you then let us know and we can put in a prescription for it  Try and use your albuterol as needed  Call us with significant concerns  PFT at next visit  We will consider repeating your CT scan at some point when you are much better

## 2022-11-05 NOTE — Progress Notes (Signed)
Mary Hart    [id number]    10-Feb-1941  Primary Care Physician:Jackson, Hulen Shouts, PA-C  Referring Physician: Jake Samples, PA-C [address] [place],  [place] [address]  Chief complaint:   Patient being seen for protracted shortness of breath  HPI:  Has not COPD for many years but in the last few months has felt really bad She had gone to see a show and since then has been more short of breath despite escalation of treatment  Currently uses Breo Did not tolerate Breztri in the past  Shortness of breath with most activities  Usually was very active in the gym trying to stay healthy but lately minimal activities does get short of breath  Denies chest pains or chest discomfort  Had a recent chest x-ray showing haziness at the bases bilaterally this was reviewed with the patient  She is just completing a course of antibiotics A tapering dose of steroid did help previously  Past history of smoking  Outpatient Encounter Medications as of 11/05/2022  Medication Sig   albuterol (VENTOLIN HFA) 108 (90 Base) MCG/ACT inhaler Inhale 1-2 puffs into the lungs every 6 (six) hours as needed for wheezing or shortness of breath.   ALPRAZolam (XANAX) 0.25 MG tablet Take 1 tablet by mouth as needed for anxiety.   BREO ELLIPTA 200-25 MCG/INH AEPB Inhale 1 puff into the lungs daily.   cetirizine (ZYRTEC) 10 MG tablet Take 10 mg by mouth at bedtime.   cholecalciferol (VITAMIN D3) 25 MCG (1000 UNIT) tablet Take 1,000 Units by mouth daily.   levothyroxine (SYNTHROID) 75 MCG tablet Take 75 mcg by mouth daily before breakfast.   linaclotide (LINZESS) 72 MCG capsule Take 1 tablet by mouth every morning.   ondansetron (ZOFRAN) 4 MG tablet Take 1 tablet (4 mg total) by mouth every 8 (eight) hours as needed for nausea or vomiting.   amoxicillin-clavulanate (AUGMENTIN) 875-125 MG tablet Take 1 tablet by mouth every 12 (twelve) hours. (Patient not taking: Reported on  11/05/2022)   benzonatate (TESSALON) 100 MG capsule Take 1 capsule (100 mg total) by mouth every 8 (eight) hours.   FLUZONE HIGH-DOSE QUADRIVALENT 0.7 ML SUSY Fluzone High-Dose Quad 2020-21 (PF) 240 mcg/0.7 mL IM syringe   No facility-administered encounter medications on file as of 11/05/2022.    Allergies as of 11/05/2022 - Review Complete 10/28/2022  Allergen Reaction Noted   Oxycodone Other (See Comments) and Itching 11/05/2022   Codeine Nausea And Vomiting    Hydrocodone  08/19/2019   Hydromorphone hcl Itching    Levonorgestrel-ethinyl estrad Other (See Comments) 11/05/2022   Morphine Nausea And Vomiting    Moxifloxacin Other (See Comments) 11/05/2022   Sulfonamide derivatives Other (See Comments)     Past Medical History:  Diagnosis Date   Anxiety    Arthritis    oa   Clostridium difficile infection 2013, 2015   Treated with vancomycin   COPD (chronic obstructive pulmonary disease) ([place])    Depression    Diverticula of colon    Genital herpes    GERD (gastroesophageal reflux disease)    Hypothyroid    IBS (irritable bowel syndrome)    Internal hemorrhoids    Jaundice    as child cause unknown   Neuropathy    fingers both hands   PONV (postoperative nausea and vomiting)    did well with last neck fusion    Recurrent colitis due to Clostridium difficile 07/03/2013   4 treatments  with vancomycin since late 2013/early 2014 - responds then relapses     Vocal cord dysfunction    SYASMONIC DYSPHONIA    Past Surgical History:  Procedure Laterality Date   ABDOMINAL HYSTERECTOMY     Anterior Cervical Fusion X 2     CHOLECYSTECTOMY     COLONOSCOPY  06/20/2008   [facility] Dr. Wynelle Bourgeois external hemorrhoids   COLONOSCOPY  10/12/2012   Dr. Gala Romney- internal hemorrhoids, very early, few, sigmoid diverticula o/w normal appearing colon and terminal ileum. + cdiff on stool samples taken. Segmental bx negative.   ESOPHAGOGASTRODUODENOSCOPY  11/25/10   Rourk- tubular esophagus s/p of 56  dilator from a small HH/otherwise normal   EYE SURGERY Bilateral 2012   ioc with lens replacement for cataracts   JOINT REPLACEMENT  09/2010   Thumb left   meniscus repair right knee  2014   NASAL SEPTOPLASTY W/ TURBINOPLASTY Bilateral 12/07/2021   Procedure: NASAL SEPTOPLASTY WITH TURBINATE REDUCTION;  Surgeon: Leta Baptist, MD;  Location: [facility];  Service: ENT;  Laterality: Bilateral;   TOTAL KNEE ARTHROPLASTY Right 12/27/2016   Procedure: RIGHT TOTAL KNEE ARTHROPLASTY;  Surgeon: Gaynelle Arabian, MD;  Location: [facility];  Service: Orthopedics;  Laterality: Right;  Adductor Block   VESICOVAGINAL FISTULA CLOSURE W/ TAH      Family History  Problem Relation Age of Onset   Dementia Father    Dementia Mother    Breast cancer Mother    Colon cancer Maternal Uncle    Allergic rhinitis Neg Hx    Asthma Neg Hx    Eczema Neg Hx    Urticaria Neg Hx     Social History   Socioeconomic History   Marital status: Widowed    Spouse name: Not on file   Number of children: 4   Years of education: Not on file   Highest education level: Not on file  Occupational History   Occupation: Retired    Fish farm manager: RETIRED  Tobacco Use   Smoking status: Former    Packs/day: 0.66    Years: 50.00    Total pack years: 33.00    Types: Cigarettes   Smokeless tobacco: Never   Tobacco comments:    1/2 pack per day  Vaping Use   Vaping Use: Former  Substance and Sexual Activity   Alcohol use: No   Drug use: No   Sexual activity: Not on file  Other Topics Concern   Not on file  Social History Narrative   Married, 2 sons and 2 daughters   Husband chronically ill and she helps with care   She is retired   Investment banker, operational of Radio broadcast assistant Strain: Not on file  Food Insecurity: Not on file  Transportation Needs: Not on file  Physical Activity: Not on file  Stress: Not on file  Social Connections: Not on file  Intimate Partner Violence: Not on file    Review of  Systems  Respiratory:  Positive for cough and shortness of breath.     Vitals:   11/05/22 1401  BP: 120/68  Pulse: 94  SpO2: 99%     Physical Exam Constitutional:      Appearance: Normal appearance.  HENT:     Head: Normocephalic.     Mouth/Throat:     Mouth: Mucous membranes are moist.  Eyes:     General: No scleral icterus. Cardiovascular:     Rate and Rhythm: Normal rate and regular rhythm.     Heart sounds:  No murmur heard.    No friction rub.  Pulmonary:     Effort: No respiratory distress.     Breath sounds: No stridor. No wheezing or rhonchi.  Musculoskeletal:     Cervical back: No rigidity or tenderness.  Neurological:     Mental Status: She is alert.  Psychiatric:        Mood and Affect: Mood normal.    Data Reviewed: Chest x-ray 10/28/2022 reviewed with the patient showing bibasal haziness  CT scan from 03/24/2021 reviewed showing some evidence of bronchiectasis  No PFT on record  Assessment:  Chronic obstructive pulmonary disease with exacerbation  Shortness of breath with exertion  Severity of underlying obstructive lung disease unclear at the present time  Plan/Recommendations: Will give her a course of steroids 20 mg daily until she starts feeling better and then decrease to 10 mg, stop steroids in about 10 to 14 days  Sample of Trelegy will be provided in place of Breo -If this is better tolerated then we will leave on Trelegy, call in a prescription and discontinue Breo at that time Did not tolerate Breztri in the past  Use albuterol as needed  Will consider pulmonary function test at next visit when she is feeling better  Repeating the CT scan of the chest when she is more stable will also be beneficial  Encouraged to get back to graded exercises as tolerated, avoid overexertion  Tentative follow-up in about 8 weeks   Sherrilyn Rist MD [facility] 11/05/2022, 2:07 PM  CC: Jake Samples, PA*

## 2022-11-10 DIAGNOSIS — Z1283 Encounter for screening for malignant neoplasm of skin: Secondary | ICD-10-CM | POA: Diagnosis not present

## 2022-11-10 DIAGNOSIS — L904 Acrodermatitis chronica atrophicans: Secondary | ICD-10-CM | POA: Diagnosis not present

## 2022-11-10 DIAGNOSIS — L853 Xerosis cutis: Secondary | ICD-10-CM | POA: Diagnosis not present

## 2022-11-10 DIAGNOSIS — D225 Melanocytic nevi of trunk: Secondary | ICD-10-CM | POA: Diagnosis not present

## 2022-11-19 DIAGNOSIS — R058 Other specified cough: Secondary | ICD-10-CM | POA: Diagnosis not present

## 2022-11-19 DIAGNOSIS — E6609 Other obesity due to excess calories: Secondary | ICD-10-CM | POA: Diagnosis not present

## 2022-11-19 DIAGNOSIS — J449 Chronic obstructive pulmonary disease, unspecified: Secondary | ICD-10-CM | POA: Diagnosis not present

## 2022-11-19 DIAGNOSIS — E538 Deficiency of other specified B group vitamins: Secondary | ICD-10-CM | POA: Diagnosis not present

## 2022-11-19 DIAGNOSIS — F419 Anxiety disorder, unspecified: Secondary | ICD-10-CM | POA: Diagnosis not present

## 2022-11-19 DIAGNOSIS — R399 Unspecified symptoms and signs involving the genitourinary system: Secondary | ICD-10-CM | POA: Diagnosis not present

## 2022-11-19 DIAGNOSIS — Z6828 Body mass index (BMI) 28.0-28.9, adult: Secondary | ICD-10-CM | POA: Diagnosis not present

## 2023-01-12 ENCOUNTER — Ambulatory Visit (INDEPENDENT_AMBULATORY_CARE_PROVIDER_SITE_OTHER): Payer: PPO | Admitting: Pulmonary Disease

## 2023-01-12 ENCOUNTER — Encounter: Payer: Self-pay | Admitting: Pulmonary Disease

## 2023-01-12 ENCOUNTER — Ambulatory Visit: Payer: PPO | Admitting: Pulmonary Disease

## 2023-01-12 VITALS — BP 104/62 | HR 83 | Ht 62.0 in | Wt 161.2 lb

## 2023-01-12 DIAGNOSIS — R0602 Shortness of breath: Secondary | ICD-10-CM

## 2023-01-12 LAB — PULMONARY FUNCTION TEST
DL/VA % pred: 91 %
DL/VA: 3.79 ml/min/mmHg/L
DLCO cor % pred: 96 %
DLCO cor: 16.97 ml/min/mmHg
DLCO unc % pred: 98 %
DLCO unc: 17.37 ml/min/mmHg
FEF 25-75 Post: 2.14 L/sec
FEF 25-75 Pre: 2.26 L/sec
FEF2575-%Change-Post: -5 %
FEF2575-%Pred-Post: 158 %
FEF2575-%Pred-Pre: 168 %
FEV1-%Change-Post: -3 %
FEV1-%Pred-Post: 109 %
FEV1-%Pred-Pre: 113 %
FEV1-Post: 1.95 L
FEV1-Pre: 2.02 L
FEV1FVC-%Change-Post: 3 %
FEV1FVC-%Pred-Pre: 114 %
FEV6-%Change-Post: -7 %
FEV6-%Pred-Post: 96 %
FEV6-%Pred-Pre: 104 %
FEV6-Post: 2.2 L
FEV6-Pre: 2.38 L
FEV6FVC-%Change-Post: 0 %
FEV6FVC-%Pred-Post: 105 %
FEV6FVC-%Pred-Pre: 105 %
FVC-%Change-Post: -6 %
FVC-%Pred-Post: 91 %
FVC-%Pred-Pre: 98 %
FVC-Post: 2.21 L
FVC-Pre: 2.38 L
Post FEV1/FVC ratio: 88 %
Post FEV6/FVC ratio: 100 %
Pre FEV1/FVC ratio: 85 %
Pre FEV6/FVC Ratio: 100 %
RV % pred: 102 %
RV: 2.33 L
TLC % pred: 109 %
TLC: 5.2 L

## 2023-01-12 MED ORDER — FLUTICASONE FUROATE-VILANTEROL 200-25 MCG/ACT IN AEPB: 1.0000 | INHALATION_SPRAY | Freq: Every day | RESPIRATORY_TRACT | 2 refills | Status: DC

## 2023-01-12 NOTE — Patient Instructions (Signed)
I will see you back in about 6 months  Prescription for Memory [facility] will be sent to pharmacy for you  Your breathing study that we reviewed together today was completely normal  Continue regular exercises

## 2023-01-12 NOTE — Progress Notes (Signed)
Full PFT performed today. °

## 2023-01-12 NOTE — Patient Instructions (Signed)
Full PFT performed today. °

## 2023-01-12 NOTE — Progress Notes (Signed)
Mary Hart    [id number]    [birth year]  Primary Care Physician:Jackson, Hulen Shouts, PA-C  Referring Physician: Jake Samples, PA-C [address] [place],  [place] [address]  Chief complaint:   Patient being seen for protracted shortness of breath  HPI:  Has not COPD for many years but in the last few months has felt really bad She had gone to see a show and since then has been more short of breath despite escalation of treatment  Currently uses Breo Did not tolerate Judithann Sauger previously  She did try Trelegy for couple of weeks, noticed a bad taste has since gone back to using Breo regularly and she feels this has kept her breathing stable  Denies any chest pains or chest discomfort  She does try to be active on a regular basis going to the gym  Had a recent chest x-ray showing haziness at the bases bilaterally this was reviewed with the patient  She is just completing a course of antibiotics A tapering dose of steroid did help previously  Past history of smoking  Outpatient Encounter Medications as of 01/12/2023  Medication Sig   albuterol (VENTOLIN HFA) 108 (90 Base) MCG/ACT inhaler Inhale 1-2 puffs into the lungs every 6 (six) hours as needed for wheezing or shortness of breath.   ALPRAZolam (XANAX) 0.25 MG tablet Take 1 tablet by mouth as needed for anxiety.   BREO ELLIPTA 200-25 MCG/INH AEPB Inhale 1 puff into the lungs daily.   cetirizine (ZYRTEC) 10 MG tablet Take 10 mg by mouth at bedtime.   cholecalciferol (VITAMIN D3) 25 MCG (1000 UNIT) tablet Take 1,000 Units by mouth daily.   Fluticasone-Umeclidin-Vilant (TRELEGY ELLIPTA) 200-62.5-25 MCG/ACT AEPB Inhale 2 puffs into the lungs daily.   FLUZONE HIGH-DOSE QUADRIVALENT 0.7 ML SUSY Fluzone High-Dose Quad 2020-21 (PF) 240 mcg/0.7 mL IM syringe   levothyroxine (SYNTHROID) 75 MCG tablet Take 75 mcg by mouth daily before breakfast.   linaclotide (LINZESS) 72 MCG capsule Take 1 tablet by mouth  every morning.   ondansetron (ZOFRAN) 4 MG tablet Take 1 tablet (4 mg total) by mouth every 8 (eight) hours as needed for nausea or vomiting.   [DISCONTINUED] predniSONE (DELTASONE) 10 MG tablet Take 2 tablets (20 mg total) by mouth daily with breakfast.   No facility-administered encounter medications on file as of 01/12/2023.    Allergies as of 01/12/2023 - Review Complete 01/12/2023  Allergen Reaction Noted   Oxycodone Other (See Comments) and Itching 11/05/2022   Codeine Nausea And Vomiting    Hydrocodone  08/19/2019   Hydromorphone hcl Itching    Levonorgestrel-ethinyl estrad Other (See Comments) 11/05/2022   Morphine Nausea And Vomiting    Moxifloxacin Other (See Comments) 11/05/2022   Sulfonamide derivatives Other (See Comments)     Past Medical History:  Diagnosis Date   Anxiety    Arthritis    oa   Clostridium difficile infection 2013, 2015   Treated with vancomycin   COPD (chronic obstructive pulmonary disease) ([place])    Depression    Diverticula of colon    Genital herpes    GERD (gastroesophageal reflux disease)    Hypothyroid    IBS (irritable bowel syndrome)    Internal hemorrhoids    Jaundice    as child cause unknown   Neuropathy    fingers both hands   PONV (postoperative nausea and vomiting)    did well with last neck fusion    Recurrent colitis  due to Clostridium difficile 07/03/2013   4 treatments with vancomycin since late 2013/early 2014 - responds then relapses     Vocal cord dysfunction    SYASMONIC DYSPHONIA    Past Surgical History:  Procedure Laterality Date   ABDOMINAL HYSTERECTOMY     Anterior Cervical Fusion X 2     CHOLECYSTECTOMY     COLONOSCOPY  06/20/2008   [facility] Dr. Wynelle Bourgeois external hemorrhoids   COLONOSCOPY  10/12/2012   Dr. Gala Romney- internal hemorrhoids, very early, few, sigmoid diverticula o/w normal appearing colon and terminal ileum. + cdiff on stool samples taken. Segmental bx negative.   ESOPHAGOGASTRODUODENOSCOPY  11/25/10    Rourk- tubular esophagus s/p of 56 dilator from a small HH/otherwise normal   EYE SURGERY Bilateral 2012   ioc with lens replacement for cataracts   JOINT REPLACEMENT  09/2010   Thumb left   meniscus repair right knee  2014   NASAL SEPTOPLASTY W/ TURBINOPLASTY Bilateral 12/07/2021   Procedure: NASAL SEPTOPLASTY WITH TURBINATE REDUCTION;  Surgeon: Leta Baptist, MD;  Location: [facility];  Service: ENT;  Laterality: Bilateral;   TOTAL KNEE ARTHROPLASTY Right 12/27/2016   Procedure: RIGHT TOTAL KNEE ARTHROPLASTY;  Surgeon: Gaynelle Arabian, MD;  Location: [facility];  Service: Orthopedics;  Laterality: Right;  Adductor Block   VESICOVAGINAL FISTULA CLOSURE W/ TAH      Family History  Problem Relation Age of Onset   Dementia Father    Dementia Mother    Breast cancer Mother    Colon cancer Maternal Uncle    Allergic rhinitis Neg Hx    Asthma Neg Hx    Eczema Neg Hx    Urticaria Neg Hx     Social History   Socioeconomic History   Marital status: Widowed    Spouse name: Not on file   Number of children: 4   Years of education: Not on file   Highest education level: Not on file  Occupational History   Occupation: Retired    Fish farm manager: RETIRED  Tobacco Use   Smoking status: Former    Packs/day: 0.66    Years: 50.00    Additional pack years: 0.00    Total pack years: 33.00    Types: Cigarettes   Smokeless tobacco: Never   Tobacco comments:    1/2 pack per day  Vaping Use   Vaping Use: Former  Substance and Sexual Activity   Alcohol use: No   Drug use: No   Sexual activity: Not on file  Other Topics Concern   Not on file  Social History Narrative   Married, 2 sons and 2 daughters   Husband chronically ill and she helps with care   She is retired   Investment banker, operational of Radio broadcast assistant Strain: Not on file  Food Insecurity: Not on file  Transportation Needs: Not on file  Physical Activity: Not on file  Stress: Not on file  Social Connections: Not on  file  Intimate Partner Violence: Not on file    Review of Systems  Respiratory:  Positive for cough and shortness of breath.     Vitals:   01/12/23 1308  BP: 104/62  Pulse: 83  SpO2: 94%     Physical Exam Constitutional:      Appearance: Normal appearance.  HENT:     Head: Normocephalic.     Mouth/Throat:     Mouth: Mucous membranes are moist.  Eyes:     General: No scleral icterus. Cardiovascular:  Rate and Rhythm: Normal rate and regular rhythm.     Heart sounds: No murmur heard.    No friction rub.  Pulmonary:     Effort: No respiratory distress.     Breath sounds: No stridor. No wheezing or rhonchi.  Musculoskeletal:     Cervical back: No rigidity or tenderness.  Neurological:     Mental Status: She is alert.  Psychiatric:        Mood and Affect: Mood normal.    Data Reviewed: Chest x-ray 10/28/2022 reviewed with the patient showing bibasal haziness  CT scan from 03/24/2021 reviewed showing some evidence of bronchiectasis  PFT was reviewed with the patient today showing no obstruction, no significant bronchodilator response, no restriction, normal diffusing capacity  Assessment:  History of chronic obstructive pulmonary disease with stable symptoms  She does have some shortness of breath on exertion  Tolerating Breo well  Based on the PFT, current study does not reveal severe obstructive disease but patient does describe improvement in symptoms with being on Breo  Plan/Recommendations: Will provide prescription for Breo  Continue to use albuterol as needed Has not needed albuterol recently  She has continued to do well and functioning well  Encouraged to continue graded activities as tolerated  Tentative follow-up in 6 months  Sherrilyn Rist MD [facility] 01/12/2023, 1:29 PM  CC: Jake Samples, PA*

## 2023-01-12 NOTE — Addendum Note (Signed)
Addended by: Sherrilyn Rist A on: 01/12/2023 01:41 PM   Modules accepted: Orders

## 2023-01-25 ENCOUNTER — Ambulatory Visit: Payer: PPO | Admitting: Family Medicine

## 2023-02-01 ENCOUNTER — Encounter: Payer: Self-pay | Admitting: Family Medicine

## 2023-02-01 ENCOUNTER — Ambulatory Visit (INDEPENDENT_AMBULATORY_CARE_PROVIDER_SITE_OTHER): Payer: PPO | Admitting: Family Medicine

## 2023-02-01 DIAGNOSIS — N9489 Other specified conditions associated with female genital organs and menstrual cycle: Secondary | ICD-10-CM

## 2023-02-01 DIAGNOSIS — I700 Atherosclerosis of aorta: Secondary | ICD-10-CM | POA: Diagnosis not present

## 2023-02-01 DIAGNOSIS — J449 Chronic obstructive pulmonary disease, unspecified: Secondary | ICD-10-CM | POA: Diagnosis not present

## 2023-02-01 DIAGNOSIS — Z87891 Personal history of nicotine dependence: Secondary | ICD-10-CM | POA: Diagnosis not present

## 2023-02-01 DIAGNOSIS — R39198 Other difficulties with micturition: Secondary | ICD-10-CM | POA: Diagnosis not present

## 2023-02-01 DIAGNOSIS — Z13220 Encounter for screening for lipoid disorders: Secondary | ICD-10-CM | POA: Diagnosis not present

## 2023-02-01 DIAGNOSIS — R233 Spontaneous ecchymoses: Secondary | ICD-10-CM | POA: Diagnosis not present

## 2023-02-01 DIAGNOSIS — R7989 Other specified abnormal findings of blood chemistry: Secondary | ICD-10-CM

## 2023-02-01 DIAGNOSIS — E039 Hypothyroidism, unspecified: Secondary | ICD-10-CM

## 2023-02-01 NOTE — Patient Instructions (Addendum)
Labs today.  Referral placed.  Continue your medications.  Follow up in 3 months.

## 2023-02-02 DIAGNOSIS — R39198 Other difficulties with micturition: Secondary | ICD-10-CM | POA: Insufficient documentation

## 2023-02-02 DIAGNOSIS — N9489 Other specified conditions associated with female genital organs and menstrual cycle: Secondary | ICD-10-CM | POA: Insufficient documentation

## 2023-02-02 DIAGNOSIS — K644 Residual hemorrhoidal skin tags: Secondary | ICD-10-CM | POA: Diagnosis not present

## 2023-02-02 DIAGNOSIS — J449 Chronic obstructive pulmonary disease, unspecified: Secondary | ICD-10-CM | POA: Insufficient documentation

## 2023-02-02 DIAGNOSIS — N83201 Unspecified ovarian cyst, right side: Secondary | ICD-10-CM | POA: Diagnosis not present

## 2023-02-02 DIAGNOSIS — E039 Hypothyroidism, unspecified: Secondary | ICD-10-CM | POA: Insufficient documentation

## 2023-02-02 LAB — CMP14+EGFR
ALT: 23 IU/L (ref 0–32)
AST: 24 IU/L (ref 0–40)
Albumin/Globulin Ratio: 2 (ref 1.2–2.2)
Albumin: 4.3 g/dL (ref 3.8–4.8)
Alkaline Phosphatase: 93 IU/L (ref 44–121)
BUN/Creatinine Ratio: 18 (ref 12–28)
BUN: 19 mg/dL (ref 8–27)
Bilirubin Total: <0.2 mg/dL (ref 0.0–1.2)
CO2: 20 mmol/L (ref 20–29)
Calcium: 9.4 mg/dL (ref 8.7–10.3)
Chloride: 102 mmol/L (ref 96–106)
Creatinine, Ser: 1.06 mg/dL — ABNORMAL HIGH (ref 0.57–1.00)
Globulin, Total: 2.2 g/dL (ref 1.5–4.5)
Glucose: 72 mg/dL (ref 70–99)
Potassium: 4.9 mmol/L (ref 3.5–5.2)
Sodium: 141 mmol/L (ref 134–144)
Total Protein: 6.5 g/dL (ref 6.0–8.5)
eGFR: 53 mL/min/{1.73_m2} — ABNORMAL LOW (ref >59)

## 2023-02-02 LAB — CBC
Hematocrit: 43.1 % (ref 34.0–46.6)
Hemoglobin: 14 g/dL (ref 11.1–15.9)
MCH: 30 pg (ref 26.6–33.0)
MCHC: 32.5 g/dL (ref 31.5–35.7)
MCV: 92 fL (ref 79–97)
Platelets: 331 10*3/uL (ref 150–450)
RBC: 4.67 x10E6/uL (ref 3.77–5.28)
RDW: 13.3 % (ref 11.7–15.4)
WBC: 9.2 10*3/uL (ref 3.4–10.8)

## 2023-02-02 LAB — LIPID PANEL
Chol/HDL Ratio: 2 ratio (ref 0.0–4.4)
Cholesterol, Total: 195 mg/dL (ref 100–199)
HDL: 98 mg/dL (ref >39)
LDL Chol Calc (NIH): 79 mg/dL (ref 0–99)
Triglycerides: 102 mg/dL (ref 0–149)
VLDL Cholesterol Cal: 18 mg/dL (ref 5–40)

## 2023-02-02 LAB — CA 125: Cancer Antigen (CA) 125: 33.2 U/mL (ref 0.0–38.1)

## 2023-02-02 LAB — TSH: TSH: 0.92 u[IU]/mL (ref 0.450–4.500)

## 2023-02-02 NOTE — Progress Notes (Signed)
Subjective:  Patient ID: Mary Hart, female    DOB: [birth year]  Age: 82 y.o. MRN: [id number]  CC: Chief Complaint  Patient presents with   Establish Care    Trouble emptying urine , right leg bruising and right foot neuropathy     HPI:  82 year old female presents to establish care.  Patient is a former smoker.  She was previously getting CT lung cancer screening.  She is still a candidate for this.  Needs referral.  Patient reports that she is having trouble emptying her bladder.  She states that she has applied pressure and bear down significantly to help her urinate.  She states that she previously had her "bladder stretched".  No dysuria.  No abdominal pain.  Has underlying COPD.  Follows with pulmonology.  Currently stable on Breo.  Needs labs today.  Has hypothyroidism.  Has been stable on levothyroxine 75 mcg daily.  Lastly, patient recently evaluated for abdominal pain.  Imaging revealed adnexal mass.  Ultrasound was obtained and revealed findings most consistent with a complex cyst.  However, malignancy was not excluded.  Patient states that she has an appointment with OB/GYN tomorrow.  Patient Active Problem List   Diagnosis Date Noted   COPD (chronic obstructive pulmonary disease) 02/02/2023   Difficulty voiding 02/02/2023   Adnexal mass 02/02/2023   Hypothyroidism 02/02/2023   Aortic atherosclerosis 02/01/2023   History of total knee replacement, right 03/28/2019   OA (osteoarthritis) of knee 12/27/2016   Dysphonia 10/15/2016   Irritable bowel syndrome 05/04/2012   GERD (gastroesophageal reflux disease) 02/14/2012    Social Hx   Social History   Socioeconomic History   Marital status: Widowed    Spouse name: Not on file   Number of children: 4   Years of education: Not on file   Highest education level: Not on file  Occupational History   Occupation: Retired    Associate Professor: RETIRED  Tobacco Use   Smoking status: Former    Packs/day: 0.66    Years:  50.00    Additional pack years: 0.00    Total pack years: 33.00    Types: Cigarettes   Smokeless tobacco: Never   Tobacco comments:    1/2 pack per day  Vaping Use   Vaping Use: Former  Substance and Sexual Activity   Alcohol use: No   Drug use: No   Sexual activity: Not on file  Other Topics Concern   Not on file  Social History Narrative   Married, 2 sons and 2 daughters   Husband chronically ill and she helps with care   She is retired   International aid/development worker of Corporate investment banker Strain: Not on file  Food Insecurity: Not on file  Transportation Needs: Not on file  Physical Activity: Not on file  Stress: Not on file  Social Connections: Not on file    Review of Systems Per HPI  Objective:  BP 138/78   Pulse 71   Temp (!) 97.3 F (36.3 C)   Ht 5\' 2"  (1.575 m)   Wt 162 lb (73.5 kg)   SpO2 99%   BMI 29.63 kg/m      02/01/2023    2:01 PM 01/12/2023    1:08 PM 11/05/2022    2:01 PM  BP/Weight  Systolic BP 138 104 120  Diastolic BP 78 62 68  Wt. (Lbs) 162 161.2 154  BMI 29.63 kg/m2 29.48 kg/m2 28.17 kg/m2    Physical Exam Vitals and nursing  note reviewed.  Constitutional:      General: She is not in acute distress.    Appearance: Normal appearance.  HENT:     Head: Normocephalic and atraumatic.  Cardiovascular:     Rate and Rhythm: Normal rate and regular rhythm.  Pulmonary:     Effort: Pulmonary effort is normal.     Breath sounds: Normal breath sounds. No wheezing, rhonchi or rales.  Neurological:     Mental Status: She is alert.  Psychiatric:        Mood and Affect: Mood normal.        Behavior: Behavior normal.     Lab Results  Component Value Date   WBC 9.2 02/01/2023   HGB 14.0 02/01/2023   HCT 43.1 02/01/2023   PLT 331 02/01/2023   GLUCOSE 72 02/01/2023   CHOL 195 02/01/2023   TRIG 102 02/01/2023   HDL 98 02/01/2023   LDLCALC 79 02/01/2023   ALT 23 02/01/2023   AST 24 02/01/2023   NA 141 02/01/2023   K 4.9 02/01/2023    CL 102 02/01/2023   CREATININE 1.06 (H) 02/01/2023   BUN 19 02/01/2023   CO2 20 02/01/2023   TSH 0.920 02/01/2023   INR 0.93 12/21/2016     Assessment & Plan:   Problem List Items Addressed This Visit       Cardiovascular and Mediastinum   Aortic atherosclerosis     Respiratory   COPD (chronic obstructive pulmonary disease)    Stable.  Continue Breo.        Endocrine   Hypothyroidism    TSH has returned stable.  Continue current dosing of Synthroid.      Relevant Orders   TSH (Completed)     Other   Difficulty voiding    Referring to urology.      Relevant Orders   Ambulatory referral to Urology   Adnexal mass    CA 125 was normal.  Awaiting opinion from OB/GYN.      Relevant Orders   CA 125 (Completed)   Other Visit Diagnoses     Easy bruising       Relevant Orders   CBC (Completed)   Elevated serum creatinine       Relevant Orders   CMP14+EGFR (Completed)   Screening, lipid       Relevant Orders   Lipid panel (Completed)   Former smoker       Relevant Orders   Ambulatory Referral Lung Cancer Screening [facility]       Follow-up:  Return in about 3 months (around 05/03/2023).  Everlene Other DO [facility]

## 2023-02-02 NOTE — Assessment & Plan Note (Signed)
Stable. Continue Breo.  

## 2023-02-02 NOTE — Assessment & Plan Note (Signed)
CA 125 was normal.  Awaiting opinion from OB/GYN.

## 2023-02-02 NOTE — Assessment & Plan Note (Signed)
Referring to urology.

## 2023-02-02 NOTE — Assessment & Plan Note (Signed)
TSH has returned stable.  Continue current dosing of Synthroid.

## 2023-02-11 DIAGNOSIS — N83201 Unspecified ovarian cyst, right side: Secondary | ICD-10-CM | POA: Diagnosis not present

## 2023-02-11 DIAGNOSIS — R1903 Right lower quadrant abdominal swelling, mass and lump: Secondary | ICD-10-CM | POA: Diagnosis not present

## 2023-02-14 DIAGNOSIS — M25562 Pain in left knee: Secondary | ICD-10-CM | POA: Diagnosis not present

## 2023-03-02 DIAGNOSIS — N83209 Unspecified ovarian cyst, unspecified side: Secondary | ICD-10-CM | POA: Diagnosis not present

## 2023-03-14 ENCOUNTER — Telehealth: Payer: Self-pay | Admitting: *Deleted

## 2023-03-14 NOTE — Telephone Encounter (Signed)
Attempted to reach the patient to schedule a new patient appt. LMOM for the patient to call the office back to scheduled an new patient appt.  

## 2023-03-15 NOTE — Telephone Encounter (Signed)
Attempted to reach the patient to schedule a new patient appt. LMOM for the patient to call the office back to scheduled an new patient appt.

## 2023-03-17 NOTE — Telephone Encounter (Signed)
Spoke with the patient regarding the referral to GYN oncology. Patient scheduled as new patient with Dr Pricilla Holm on 6/13 at 9:45 am. Patient given an arrival time of 9:15 am.   Explained to the patient the the doctor will perform a pelvic exam at this visit. Patient given the policy that only one visitor allowed and that visitor must be over 16 yrs are allowed in the Cancer Center. Patient given the address/phone number for the clinic and that the center offers free valet service. Patient aware that masks are option.

## 2023-03-22 ENCOUNTER — Ambulatory Visit (HOSPITAL_COMMUNITY)
Admission: RE | Admit: 2023-03-22 | Discharge: 2023-03-22 | Disposition: A | Discharge status: Home / Self Care | Payer: PPO | Source: Ambulatory Visit | Attending: Family Medicine | Admitting: Family Medicine

## 2023-03-22 ENCOUNTER — Ambulatory Visit (INDEPENDENT_AMBULATORY_CARE_PROVIDER_SITE_OTHER): Payer: PPO | Admitting: Family Medicine

## 2023-03-22 ENCOUNTER — Encounter: Payer: Self-pay | Admitting: Family Medicine

## 2023-03-22 DIAGNOSIS — R1030 Lower abdominal pain, unspecified: Secondary | ICD-10-CM | POA: Insufficient documentation

## 2023-03-22 DIAGNOSIS — K5900 Constipation, unspecified: Secondary | ICD-10-CM | POA: Insufficient documentation

## 2023-03-22 DIAGNOSIS — N9489 Other specified conditions associated with female genital organs and menstrual cycle: Secondary | ICD-10-CM | POA: Insufficient documentation

## 2023-03-22 DIAGNOSIS — K5730 Diverticulosis of large intestine without perforation or abscess without bleeding: Secondary | ICD-10-CM | POA: Diagnosis not present

## 2023-03-22 MED ORDER — TRULANCE 3 MG PO TABS: 3.0000 mg | ORAL_TABLET | Freq: Every day | ORAL | 3 refills | Status: DC

## 2023-03-22 MED ORDER — IOHEXOL 300 MG/ML  SOLN
100.0000 mL | Freq: Once | INTRAMUSCULAR | Status: AC | PRN
  Administered 2023-03-22: 100 mL via INTRAVENOUS

## 2023-03-22 NOTE — Progress Notes (Signed)
Subjective:  Patient ID: Mary Hart, female    DOB: 1941/05/13  Age: 82 y.o. MRN: [id number]  CC: Chief Complaint  Patient presents with   Constipation    HPI:  82 year old female with the below mentioned medical problems presents with complaints of constipation.  Has a history of constipation (IBS-C). Reports ongoing constipation, worsening over the past month. She has tried several treatments with limited improvement - Mag citrate, Dulcolax, old Rx for Linzess. No clear inciting factor. She does not recent dietary changes, but she is eating quite healthy with increased fruits and vegetables. No fever. No other reported symptoms. No other complaints.   Patient Active Problem List   Diagnosis Date Noted   Constipation 03/22/2023   COPD (chronic obstructive pulmonary disease) (HCC) 02/02/2023   Difficulty voiding 02/02/2023   Adnexal mass 02/02/2023   Hypothyroidism 02/02/2023   Aortic atherosclerosis (HCC) 02/01/2023   History of total knee replacement, right 03/28/2019   OA (osteoarthritis) of knee 12/27/2016   Irritable bowel syndrome 05/04/2012   GERD (gastroesophageal reflux disease) 02/14/2012    Social Hx   Social History   Socioeconomic History   Marital status: Widowed    Spouse name: Not on file   Number of children: 4   Years of education: Not on file   Highest education level: Not on file  Occupational History   Occupation: Retired    Associate Professor: RETIRED  Tobacco Use   Smoking status: Former    Packs/day: 0.66    Years: 50.00    Additional pack years: 0.00    Total pack years: 33.00    Types: Cigarettes   Smokeless tobacco: Never   Tobacco comments:    1/2 pack per day  Vaping Use   Vaping Use: Former  Substance and Sexual Activity   Alcohol use: No   Drug use: No   Sexual activity: Not on file  Other Topics Concern   Not on file  Social History Narrative   Married, 2 sons and 2 daughters   Husband chronically ill and she helps with care    She is retired   International aid/development worker of [employer] investment [employer] Strain: Not on file  Food Insecurity: Not on file  Transportation Needs: Not on file  Physical Activity: Not on file  Stress: Not on file  Social Connections: Not on file    Review of Systems Per HPI  Objective:  BP (!) 157/81   Pulse 72   Ht 5\' 2"  (1.575 m)   Wt 163 lb 3.2 oz (74 kg)   SpO2 98%   BMI 29.85 kg/m      03/22/2023    2:34 PM 02/01/2023    2:01 PM 01/12/2023    1:08 PM  BP/Weight  Systolic BP 157 138 104  Diastolic BP 81 78 62  Wt. (Lbs) 163.2 162 161.2  BMI 29.85 kg/m2 29.63 kg/m2 29.48 kg/m2    Physical Exam Vitals and nursing note reviewed.  Constitutional:      General: She is not in acute distress.    Appearance: Normal appearance.  HENT:     Head: Normocephalic and atraumatic.  Cardiovascular:     Rate and Rhythm: Normal rate and regular rhythm.  Pulmonary:     Effort: Pulmonary effort is normal.     Breath sounds: Normal breath sounds. No wheezing, rhonchi or rales.  Abdominal:     General: There is no distension.     Palpations: Abdomen is soft.  Tenderness: There is no abdominal tenderness.  Neurological:     Mental Status: She is alert.  Psychiatric:        Mood and Affect: Mood normal.        Behavior: Behavior normal.     Lab Results  Component Value Date   WBC 9.2 02/01/2023   HGB 14.0 02/01/2023   HCT 43.1 02/01/2023   PLT 331 02/01/2023   GLUCOSE 72 02/01/2023   CHOL 195 02/01/2023   TRIG 102 02/01/2023   HDL 98 02/01/2023   LDLCALC 79 02/01/2023   ALT 23 02/01/2023   AST 24 02/01/2023   NA 141 02/01/2023   K 4.9 02/01/2023   CL 102 02/01/2023   CREATININE 1.06 (H) 02/01/2023   BUN 19 02/01/2023   CO2 20 02/01/2023   TSH 0.920 02/01/2023   INR 0.93 12/21/2016     Assessment & Plan:   Problem List Items Addressed This Visit       Other   Adnexal mass    Given constipation and elevated tumor markers done by GYN, CT obtained today to  assess. Mass decreased in size. No acute abnormalities.      Relevant Orders   CT ABDOMEN PELVIS W CONTRAST (Completed)   Constipation    Uncontrolled/worsening. Has underlying IBS-C. Trial of Trulance.      Relevant Orders   CT ABDOMEN PELVIS W CONTRAST (Completed)   Other Visit Diagnoses     Lower abdominal pain       Relevant Orders   CT ABDOMEN PELVIS W CONTRAST (Completed)       Meds ordered this encounter  Medications   Plecanatide (TRULANCE) 3 MG TABS    Sig: Take 1 tablet (3 mg total) by mouth daily.    Dispense:  30 tablet    Refill:  3    Follow-up:  Has scheduled follow up.  Everlene Other DO [facility]

## 2023-03-22 NOTE — Assessment & Plan Note (Signed)
Uncontrolled/worsening. Has underlying IBS-C. Trial of Trulance.

## 2023-03-22 NOTE — Patient Instructions (Signed)
We will call about the CT scan.  Medication as prescribed.  Follow up in 1 month.

## 2023-03-22 NOTE — Assessment & Plan Note (Signed)
Given constipation and elevated tumor markers done by GYN, CT obtained today to assess. Mass decreased in size. No acute abnormalities.

## 2023-03-25 ENCOUNTER — Telehealth: Payer: Self-pay

## 2023-03-25 ENCOUNTER — Ambulatory Visit: Payer: PPO | Admitting: Family Medicine

## 2023-03-25 NOTE — Telephone Encounter (Signed)
Pt is calling and asking Dr Adriana Simas put her on Plecanatide (TRULANCE) 3 MG TABS but [facility] was waiting on authorization pt said she had some linaclotide (LINZESS) 72 MCG capsule left she started back taking one ever day this week she is almost out she said she started feeling a little better and wanted to know if Dr Adriana Simas can call her in 90 day supply of this instead. Pt said reason she is requesting 90 day supply is because she has to pay 45.00 for just one bottle.  [address]   [place] [phone]

## 2023-03-26 ENCOUNTER — Other Ambulatory Visit: Payer: Self-pay | Admitting: Family Medicine

## 2023-03-26 MED ORDER — LINACLOTIDE 72 MCG PO CAPS: 72.0000 ug | ORAL_CAPSULE | Freq: Every day | ORAL | 1 refills | Status: DC

## 2023-03-28 NOTE — Telephone Encounter (Signed)
Cook, Jayce G, DO     Rx sent.   

## 2023-03-29 ENCOUNTER — Ambulatory Visit (INDEPENDENT_AMBULATORY_CARE_PROVIDER_SITE_OTHER): Payer: PPO | Admitting: Urology

## 2023-03-29 ENCOUNTER — Encounter: Payer: Self-pay | Admitting: Urology

## 2023-03-29 VITALS — BP 146/77 | HR 72

## 2023-03-29 DIAGNOSIS — R3129 Other microscopic hematuria: Secondary | ICD-10-CM

## 2023-03-29 DIAGNOSIS — R3915 Urgency of urination: Secondary | ICD-10-CM

## 2023-03-29 DIAGNOSIS — R351 Nocturia: Secondary | ICD-10-CM

## 2023-03-29 DIAGNOSIS — R339 Retention of urine, unspecified: Secondary | ICD-10-CM | POA: Diagnosis not present

## 2023-03-29 LAB — URINALYSIS, ROUTINE W REFLEX MICROSCOPIC
Bilirubin, UA: NEGATIVE
Glucose, UA: NEGATIVE
Ketones, UA: NEGATIVE
Leukocytes,UA: NEGATIVE
Nitrite, UA: NEGATIVE
Protein,UA: NEGATIVE
Specific Gravity, UA: 1.015 (ref 1.005–1.030)
Urobilinogen, Ur: 0.2 mg/dL (ref 0.2–1.0)
pH, UA: 6 (ref 5.0–7.5)

## 2023-03-29 LAB — MICROSCOPIC EXAMINATION
Bacteria, UA: NONE SEEN
RBC, Urine: >30 /hpf — AB (ref 0–2)

## 2023-03-29 NOTE — Progress Notes (Signed)
H&P  Chief Complaint: Difficulty voiding  History of Present Illness: Mary Hart is a 82 y.o. year old female referred by Dr. Cherlyn Cushing for evaluation and management of lower urinary tract symptoms.   Years ago, she came to the local urologist here in [place] for urinary tract symptoms.  Apparently she had regular urethral dilations.  Over the past several months she has had urinary urgency, frequency, feeling of incomplete emptying and feeling that she has to strain to urinate.  She has nocturia x 2-3.  No gross hematuria, no dysuria, no prior history of urolithiasis.  Past Medical History:  Diagnosis Date   Anxiety    Arthritis    oa   Clostridium difficile infection 2013, 2015   Treated with vancomycin   COPD (chronic obstructive pulmonary disease) (HCC)    Depression    Diverticula of colon    Genital herpes    GERD (gastroesophageal reflux disease)    Hypothyroid    IBS (irritable bowel syndrome)    Internal hemorrhoids    Jaundice    as child cause unknown   Neuropathy    fingers both hands   PONV (postoperative nausea and vomiting)    did well with last neck fusion    Recurrent colitis due to Clostridium difficile 07/03/2013   4 treatments with vancomycin since late 2013/early 2014 - responds then relapses     Vocal cord dysfunction    SYASMONIC DYSPHONIA    Past Surgical History:  Procedure Laterality Date   ABDOMINAL HYSTERECTOMY     Anterior Cervical Fusion X 2     CHOLECYSTECTOMY     COLONOSCOPY  06/20/2008   [facility] Dr. Damita Dunnings external hemorrhoids   COLONOSCOPY  10/12/2012   Dr. Jena Gauss- internal hemorrhoids, very early, few, sigmoid diverticula o/w normal appearing colon and terminal ileum. + cdiff on stool samples taken. Segmental bx negative.   ESOPHAGOGASTRODUODENOSCOPY  11/25/10   Rourk- tubular esophagus s/p of 56 dilator from a small HH/otherwise normal   EYE SURGERY Bilateral 2012   ioc with lens replacement for cataracts   JOINT REPLACEMENT   09/2010   Thumb left   meniscus repair right knee  2014   NASAL SEPTOPLASTY W/ TURBINOPLASTY Bilateral 12/07/2021   Procedure: NASAL SEPTOPLASTY WITH TURBINATE REDUCTION;  Surgeon: Newman Pies, MD;  Location: [facility];  Service: ENT;  Laterality: Bilateral;   TOTAL KNEE ARTHROPLASTY Right 12/27/2016   Procedure: RIGHT TOTAL KNEE ARTHROPLASTY;  Surgeon: Ollen Gross, MD;  Location: [facility];  Service: Orthopedics;  Laterality: Right;  Adductor Block   VESICOVAGINAL FISTULA CLOSURE W/ TAH      Home Medications:  (Not in a hospital admission)   Allergies:  Allergies  Allergen Reactions   Oxycodone Other (See Comments) and Itching   Codeine Nausea And Vomiting   Hydrocodone    Hydromorphone Hcl Itching   Levonorgestrel-Ethinyl Estrad Other (See Comments)   Morphine Nausea And Vomiting   Moxifloxacin Other (See Comments)    N/V/D, skin red. Tolerates Levaquin and Cipro.   Sulfonamide Derivatives Other (See Comments)    "almost died" "pulse went down to 12"    Family History  Problem Relation Age of Onset   Dementia Father    Dementia Mother    Breast cancer Mother    Colon cancer Maternal Uncle    Allergic rhinitis Neg Hx    Asthma Neg Hx    Eczema Neg Hx    Urticaria Neg Hx     Social  History:  reports that she has quit smoking. Her smoking use included cigarettes. She has a 33.00 pack-year smoking history. She has never used smokeless tobacco. She reports that she does not drink alcohol and does not use drugs.  ROS: A complete review of systems was performed.  All systems are negative except for pertinent findings as noted.  Physical Exam:  Vital signs in last 24 hours: @VSRANGES @ General:  Alert and oriented, No acute distress HEENT: Normocephalic, atraumatic Neck: No JVD or lymphadenopathy Cardiovascular: Regular rate  Lungs: Normal inspiratory/expiratory excursion Extremities: No edema Neurologic: Grossly intact   I have reviewed notes from  referring/previous physicians  I have reviewed urinalysis results  I have independently reviewed bladder scan-residual urine volume 0  I have reviewed prior urine culture  CT images were reviewed with her.  Impression/Assessment:  1.  Lower urinary tract symptoms-most likely mild overactive bladder.  She empties completely.  2.  Microscopic hematuria  Plan:  1.  Overactive bladder guidelines given-limiting evening fluids, limiting sodium, bladder retraining  2.  I will have her come back in about 3 months to review her symptomatic issues-also to recheck urine.  If persistent hematuria she will need cystoscopy  Bertram Millard Amarilis Belflower 03/29/2023, 8:34 AM  Bertram Millard. Andru Genter MD

## 2023-04-01 DIAGNOSIS — H26493 Other secondary cataract, bilateral: Secondary | ICD-10-CM | POA: Diagnosis not present

## 2023-04-01 DIAGNOSIS — H353132 Nonexudative age-related macular degeneration, bilateral, intermediate dry stage: Secondary | ICD-10-CM | POA: Diagnosis not present

## 2023-04-01 DIAGNOSIS — Z961 Presence of intraocular lens: Secondary | ICD-10-CM | POA: Diagnosis not present

## 2023-04-06 ENCOUNTER — Encounter: Payer: Self-pay | Admitting: Gynecologic Oncology

## 2023-04-06 DIAGNOSIS — H35033 Hypertensive retinopathy, bilateral: Secondary | ICD-10-CM | POA: Diagnosis not present

## 2023-04-06 DIAGNOSIS — H26491 Other secondary cataract, right eye: Secondary | ICD-10-CM | POA: Diagnosis not present

## 2023-04-06 DIAGNOSIS — H353133 Nonexudative age-related macular degeneration, bilateral, advanced atrophic without subfoveal involvement: Secondary | ICD-10-CM | POA: Diagnosis not present

## 2023-04-06 DIAGNOSIS — H43813 Vitreous degeneration, bilateral: Secondary | ICD-10-CM | POA: Diagnosis not present

## 2023-04-06 NOTE — Progress Notes (Signed)
GYNECOLOGIC ONCOLOGY NEW PATIENT CONSULTATION   Patient Name: Mary Hart  Patient Age: 82 y.o. Date of Service: 04/07/23 Referring Provider: Zelphia Cairo, MD  Primary Care Provider: Tommie Sams, DO Consulting Provider: Eugene Garnet, MD   Assessment/Plan:  Postmenopausal patient with more than a 15-year history of a complex adnexal mass.  We reviewed her long history of a pelvic mass that has been followed on interval imaging studies.  We looked at her most recent CT scan and went back and looked at images from 14 years ago.  On most recent CT scan, the complex adnexal mass that had been seen in January of this year had significantly decreased in size, now measuring similar in size to what her right adnexal cyst has historically been since first noted in the 2000's.  Even if this mass represents a malignancy, we discussed that it would be a very indolent one.  We discussed limitations of CT imaging with regard to pelvic organs.  Ultrasound with her OB/GYN showed a complex lesion although there is no note of thick septations, mural nodularity, or solid components.  We also discussed her recent tumor markers which include normal CA125, CEA, and CA 19-9.  Her HD4 was mildly elevated.  Discussed the sensitivity and specificity of these tests as well as the fact that they are not able to diagnose a malignancy.  Overall, given the relative stable size and appearance of this complex adnexal mass over more than 15 years, I suspect a benign etiology.  From a management standpoint, we discussed several options.  The first would be to proceed with diagnostic surgery which would include minimally invasive surgery to remove bilateral adnexa.  The pelvic mass would be sent to pathology for frozen section to help guide the need for any additional procedures at the time of surgery.  We also discussed the option of continued imaging surveillance.  I discussed getting an MRI either now to better  characterize the mass versus in 3-6 months.  Alternatively, we could plan on a repeat ultrasound in 3-6 months.  The patient's preference is to avoid surgery if possible.  In the setting of her age, prior surgeries, and prior C. difficile infections, she is worried about the morbidity related to surgery.  I think this is quite reasonable.  She is amenable to repeating imaging in 3-6 months.  We will plan on an MRI to hopefully better characterize the mass and assure no features that raise the concern for borderline tumor or malignancy.  If the mass appears reassuring and stable in size on MRI, we will discuss at that time whether to continue with any interval imaging versus discontinuation of surveillance imaging.  I reviewed signs and symptoms that should prompt a phone call between now and her MRI.  A copy of this note was sent to the patient's referring provider.   65 minutes of total time was spent for this patient encounter, including preparation, face-to-face counseling with the patient and coordination of care, and documentation of the encounter.  Eugene Garnet, MD  Division of Gynecologic Oncology  Department of Obstetrics and Gynecology  [facility]  ___________________________________________  Chief Complaint: Chief Complaint  Patient presents with   elevated HE4    History of Present Illness:  Mary Hart is a 82 y.o. y.o. female who is seen in consultation at the request of Dr. Renaldo Fiddler for an evaluation of an adnexal mass.  Patient has a history of a right adnexal mass.  Pelvic ultrasound exam in 2010 revealed stable, slightly complex cyst in the right adnexa, stable in size from CT imaging in 2010 and 2005.  This was thought to likely represent a postoperative seroma or liquefied hematoma versus paraovarian cyst.  Location at this time was near the right vaginal cuff.  Pelvic ultrasound exam in 2011 showed a septated cystic lesion measuring 3.3  x 1.9 x 2.8 cm consisting of 2 cystic components with an intervening thick but smooth septation.  Low-level echoes are seen throughout the cystic components.  No definitive mural nodularity or solid masses.  Lesion noted to be stable in size and morphology since prior exam.  Left ovary not visualized.  CT of the abdomen and pelvis performed on 10/28/22 in the setting of left lower quadrant pain, dark stool, and nausea revealed interval increase in size of a 6.3 x 5 cm fluid density lesion within the right adnexal region.  Follow-up ultrasound recommended for evaluation.  Colonic diverticulosis without diverticulitis noted. Follow up pelvic ultrasound on 10/29/2022 revealed a 6.1 x 4.4 x 4.9 cm anechoic lesion with increased transmission and scattered internal echoes within the right adnexa compatible with at least a mildly complex cyst.  Pelvic ultrasound exam at [facility] on 02/11/2023 shows a 2.8 x 1.6 x 2.2 cm complex cystic mass within the right adnexa without blood flow.  Left ovary not visualized.  No free fluid.  On 5/8, tumor markers were obtained. CA-125 was normal (29.3), HE4 was elevated (127). CEA was 4.2 and CA 19-9 26.   More recently, the patient underwent CT of the abdomen and pelvis on 03/22/2023 in the setting of lower abdominal pain with known right adnexal mass.  A smoothly marginated fluid density within the right adnexa now measures 3.2 x 2.6 cm, significantly decreased from prior CT imaging in January.  No internal septations or mural nodules noted.  No ascites.  Today, the patient notes overall doing well.  She denies any abdominal or pelvic pain.  She is struggling with constipation, now improved after restarting Linzess.  She just saw urology secondary to hematuria.  Plan is for follow-up in 3 months.  She denies any urinary leakage but feels like she has some urinary secretions that are mildly irritating.  Denies needing to wear a pad.  Endorses a good appetite, denies any  nausea or emesis.  Patient has a history of diverticulitis, thinks she has been on antibiotics 3 times.  She also has a history of recurrent C. difficile infections, last was after the pubic surgery.  The patient goes to the senior center 4 days a week, is active.  Notes that COPD is under good control.  Uses albuterol infrequently.  She has been diagnosed with recent worsening of her macular degeneration, hoping to start injections soon.  In terms of her surgical history, she underwent hysterectomy in her early 34s secondary to painful periods and heavy bleeding.  When asked about chart history of vesicovaginal fistula, she is unsure of any such history.  PAST MEDICAL HISTORY:  Past Medical History:  Diagnosis Date   Anxiety    Arthritis    oa   Clostridium difficile infection 2013, 2015   Treated with vancomycin   COPD (chronic obstructive pulmonary disease) (HCC)    minimal shortness of breath   Depression    Diverticula of colon    Genital herpes    GERD (gastroesophageal reflux disease)    Hypothyroid    IBS (irritable bowel syndrome)  Internal hemorrhoids    Jaundice    as child cause unknown   Neuropathy    fingers both hands   PONV (postoperative nausea and vomiting)    did well with last neck fusion    Recurrent colitis due to Clostridium difficile 07/03/2013   4 treatments with vancomycin since late 2013/early 2014 - responds then relapses     Vocal cord dysfunction    SYASMONIC DYSPHONIA     PAST SURGICAL HISTORY:  Past Surgical History:  Procedure Laterality Date   ABDOMINAL HYSTERECTOMY     Anterior Cervical Fusion X 2     CHOLECYSTECTOMY     COLONOSCOPY  06/20/2008   [facility] Dr. Damita Dunnings external hemorrhoids   COLONOSCOPY  10/12/2012   Dr. Jena Gauss- internal hemorrhoids, very early, few, sigmoid diverticula o/w normal appearing colon and terminal ileum. + cdiff on stool samples taken. Segmental bx negative.   ESOPHAGOGASTRODUODENOSCOPY  11/25/10   Rourk-  tubular esophagus s/p of 56 dilator from a small HH/otherwise normal   EYE SURGERY Bilateral 2012   ioc with lens replacement for cataracts   JOINT REPLACEMENT  09/2010   Thumb left   meniscus repair right knee  2014   NASAL SEPTOPLASTY W/ TURBINOPLASTY Bilateral 12/07/2021   Procedure: NASAL SEPTOPLASTY WITH TURBINATE REDUCTION;  Surgeon: Newman Pies, MD;  Location: [facility];  Service: ENT;  Laterality: Bilateral;   TOTAL KNEE ARTHROPLASTY Right 12/27/2016   Procedure: RIGHT TOTAL KNEE ARTHROPLASTY;  Surgeon: Ollen Gross, MD;  Location: [facility];  Service: Orthopedics;  Laterality: Right;  Adductor Block   VESICOVAGINAL FISTULA CLOSURE W/ TAH      OB/GYN HISTORY:  OB History  Gravida Para Term Preterm AB Living  1 1 1         SAB IAB Ectopic Multiple Live Births               # Outcome Date GA Lbr Len/2nd Weight Sex Delivery Anes PTL Lv  1 Term             No LMP recorded. Patient has had a hysterectomy.  Age at menarche: 8  Age at menopause: 62 Hx of HRT: denies Hx of STDs: HSV Last pap: 2020 per patient History of abnormal pap smears: denies  SCREENING STUDIES:  Last mammogram: 2023  Last colonoscopy: 2019 Last bone mineral density: 2022  MEDICATIONS: Outpatient Encounter Medications as of 04/07/2023  Medication Sig   albuterol (VENTOLIN HFA) 108 (90 Base) MCG/ACT inhaler Inhale 1-2 puffs into the lungs every 6 (six) hours as needed for wheezing or shortness of breath.   ALPRAZolam (XANAX) 0.25 MG tablet Take 1 tablet by mouth as needed for anxiety.   cetirizine (ZYRTEC) 10 MG tablet Take 10 mg by mouth at bedtime.   cholecalciferol (VITAMIN D3) 25 MCG (1000 UNIT) tablet Take 1,000 Units by mouth daily.   fluticasone (FLONASE) 50 MCG/ACT nasal spray Place into both nostrils daily.   fluticasone furoate-vilanterol (BREO ELLIPTA) 200-25 MCG/ACT AEPB Inhale 1 puff into the lungs daily.   levothyroxine (SYNTHROID) 75 MCG tablet Take 75 mcg by mouth daily before  breakfast.   linaclotide (LINZESS) 72 MCG capsule Take 1 capsule (72 mcg total) by mouth daily before breakfast.   No facility-administered encounter medications on file as of 04/07/2023.    ALLERGIES:  Allergies  Allergen Reactions   Oxycodone Other (See Comments) and Itching   Codeine Nausea And Vomiting   Hydrocodone    Hydromorphone Hcl Itching   Levonorgestrel-Ethinyl Charlott Holler  Other (See Comments)   Morphine Nausea And Vomiting   Moxifloxacin Other (See Comments)    N/V/D, skin red. Tolerates Levaquin and Cipro.   Sulfonamide Derivatives Other (See Comments)    "almost died" "pulse went down to 12"     FAMILY HISTORY:  Family History  Problem Relation Age of Onset   Dementia Mother    Breast cancer Mother    Dementia Father    Esophageal cancer Brother    Colon cancer Maternal Uncle    Breast cancer Cousin    Allergic rhinitis Neg Hx    Asthma Neg Hx    Eczema Neg Hx    Urticaria Neg Hx    Prostate cancer Neg Hx    Pancreatic cancer Neg Hx    Ovarian cancer Neg Hx    Endometrial cancer Neg Hx      SOCIAL HISTORY:  Social Connections: Unknown (04/06/2023)   Social Connection and Isolation Panel [NHANES]    Frequency of Communication with Friends and Family: More than three times a week    Frequency of Social Gatherings with Friends and Family: Three times a week    Attends Religious Services: Patient declined    Active Member of Clubs or Organizations: Yes    Attends Banker Meetings: Not on file    Marital Status: Not on file    REVIEW OF SYSTEMS:  + constipation, hematuria Denies appetite changes, fevers, chills, fatigue, unexplained weight changes. Denies hearing loss, neck lumps or masses, mouth sores, ringing in ears or voice changes. Denies cough or wheezing.  Denies shortness of breath. Denies chest pain or palpitations. Denies leg swelling. Denies abdominal distention, pain, blood in stools, diarrhea, nausea, vomiting, or early  satiety. Denies pain with intercourse, dysuria, frequency, or incontinence. Denies hot flashes, pelvic pain, vaginal bleeding or vaginal discharge.   Denies joint pain, back pain or muscle pain/cramps. Denies itching, rash, or wounds. Denies dizziness, headaches, numbness or seizures. Denies swollen lymph nodes or glands, denies easy bruising or bleeding. Denies anxiety, depression, confusion, or decreased concentration.  Physical Exam:  Vital Signs for this encounter:  Blood pressure (!) 142/52, pulse 82, temperature (!) 97.4 F (36.3 C), temperature source Oral, resp. rate 18, height 5' 1.61" (1.565 m), weight 163 lb 6.4 oz (74.1 kg), SpO2 100 %. Body mass index is 30.26 kg/m. General: Alert, oriented, no acute distress.  HEENT: Normocephalic, atraumatic. Sclera anicteric.  Chest: Clear to auscultation bilaterally. No wheezes, rhonchi, or rales. Cardiovascular: Regular rate and rhythm, no murmurs, rubs, or gallops.  Abdomen: Normoactive bowel sounds. Soft, nondistended, nontender to palpation. No masses or hepatosplenomegaly appreciated. No palpable fluid wave.  Extremities: Grossly normal range of motion. Warm, well perfused. No edema bilaterally.  Skin: No rashes or lesions.  Lymphatics: No cervical, supraclavicular, or inguinal adenopathy.  GU:  Normal external female genitalia. No lesions. No discharge or bleeding.             Bladder/urethra:  No lesions or masses, well supported bladder             Vagina: Moderately atrophic, no lesions.  Cuff intact.             Cervix/uterus: Surgically absent.             Adnexa: No masses appreciated. No nodularity.  Rectal: Deferred.  LABORATORY AND RADIOLOGIC DATA:  Outside medical records were reviewed to synthesize the above history, along with the history and physical obtained during the visit.  Lab Results  Component Value Date   WBC 9.2 02/01/2023   HGB 14.0 02/01/2023   HCT 43.1 02/01/2023   PLT 331 02/01/2023   GLUCOSE 72  02/01/2023   CHOL 195 02/01/2023   TRIG 102 02/01/2023   HDL 98 02/01/2023   LDLCALC 79 02/01/2023   ALT 23 02/01/2023   AST 24 02/01/2023   NA 141 02/01/2023   K 4.9 02/01/2023   CL 102 02/01/2023   CREATININE 1.06 (H) 02/01/2023   BUN 19 02/01/2023   CO2 20 02/01/2023   TSH 0.920 02/01/2023   INR 0.93 12/21/2016

## 2023-04-07 ENCOUNTER — Encounter: Payer: Self-pay | Admitting: Gynecologic Oncology

## 2023-04-07 ENCOUNTER — Inpatient Hospital Stay: Payer: PPO | Attending: Gynecologic Oncology | Admitting: Gynecologic Oncology

## 2023-04-07 ENCOUNTER — Other Ambulatory Visit: Payer: Self-pay

## 2023-04-07 VITALS — BP 142/52 | HR 82 | Temp 97.4°F | Resp 18 | Ht 61.61 in | Wt 163.4 lb

## 2023-04-07 DIAGNOSIS — A609 Anogenital herpesviral infection, unspecified: Secondary | ICD-10-CM | POA: Insufficient documentation

## 2023-04-07 DIAGNOSIS — H3530 Unspecified macular degeneration: Secondary | ICD-10-CM | POA: Diagnosis not present

## 2023-04-07 DIAGNOSIS — D398 Neoplasm of uncertain behavior of other specified female genital organs: Secondary | ICD-10-CM | POA: Insufficient documentation

## 2023-04-07 DIAGNOSIS — K219 Gastro-esophageal reflux disease without esophagitis: Secondary | ICD-10-CM | POA: Diagnosis not present

## 2023-04-07 DIAGNOSIS — K5790 Diverticulosis of intestine, part unspecified, without perforation or abscess without bleeding: Secondary | ICD-10-CM | POA: Insufficient documentation

## 2023-04-07 DIAGNOSIS — M1990 Unspecified osteoarthritis, unspecified site: Secondary | ICD-10-CM | POA: Insufficient documentation

## 2023-04-07 DIAGNOSIS — E039 Hypothyroidism, unspecified: Secondary | ICD-10-CM | POA: Insufficient documentation

## 2023-04-07 DIAGNOSIS — N949 Unspecified condition associated with female genital organs and menstrual cycle: Secondary | ICD-10-CM

## 2023-04-07 DIAGNOSIS — Z8619 Personal history of other infectious and parasitic diseases: Secondary | ICD-10-CM | POA: Insufficient documentation

## 2023-04-07 DIAGNOSIS — E669 Obesity, unspecified: Secondary | ICD-10-CM

## 2023-04-07 DIAGNOSIS — R978 Other abnormal tumor markers: Secondary | ICD-10-CM | POA: Insufficient documentation

## 2023-04-07 DIAGNOSIS — J449 Chronic obstructive pulmonary disease, unspecified: Secondary | ICD-10-CM | POA: Diagnosis not present

## 2023-04-07 DIAGNOSIS — F419 Anxiety disorder, unspecified: Secondary | ICD-10-CM | POA: Insufficient documentation

## 2023-04-07 DIAGNOSIS — Z90710 Acquired absence of both cervix and uterus: Secondary | ICD-10-CM | POA: Diagnosis not present

## 2023-04-07 DIAGNOSIS — F32A Depression, unspecified: Secondary | ICD-10-CM | POA: Insufficient documentation

## 2023-04-07 NOTE — Patient Instructions (Signed)
It was very nice to meet you today.  Please call if you have any new symptoms such as pelvic pain.  I will call you once I have reviewed your MRI which we will get in about 4 months.  If things look overall reassuring and stable, I think we can stop doing any repeat imaging unless you develop symptoms.  If you decide between now and when I talk to you next that you would like to proceed with diagnostic surgery, please call my office at [phone].

## 2023-04-18 ENCOUNTER — Telehealth: Payer: Self-pay | Admitting: Family Medicine

## 2023-04-18 ENCOUNTER — Other Ambulatory Visit: Payer: Self-pay | Admitting: Nurse Practitioner

## 2023-04-18 MED ORDER — LEVOTHYROXINE SODIUM 75 MCG PO TABS: 75.0000 ug | ORAL_TABLET | Freq: Every day | ORAL | 1 refills | Status: DC

## 2023-04-18 NOTE — Telephone Encounter (Signed)
Patient is requesting prescription for levothyroxine (SYNTHROID) 75 MCG tablet  sent to [facility]

## 2023-04-22 ENCOUNTER — Ambulatory Visit (INDEPENDENT_AMBULATORY_CARE_PROVIDER_SITE_OTHER): Payer: PPO

## 2023-04-22 VITALS — Ht 61.0 in | Wt 163.0 lb

## 2023-04-22 DIAGNOSIS — Z1231 Encounter for screening mammogram for malignant neoplasm of breast: Secondary | ICD-10-CM | POA: Diagnosis not present

## 2023-04-22 DIAGNOSIS — Z0000 Encounter for general adult medical examination without abnormal findings: Secondary | ICD-10-CM | POA: Diagnosis not present

## 2023-04-22 DIAGNOSIS — Z87891 Personal history of nicotine dependence: Secondary | ICD-10-CM

## 2023-04-22 DIAGNOSIS — Z780 Asymptomatic menopausal state: Secondary | ICD-10-CM

## 2023-04-22 NOTE — Progress Notes (Signed)
 Subjective:   Mary Hart is a 82 y.o. female who presents for Medicare Annual (Subsequent) preventive examination.  Visit Complete: Virtual  I connected with  Mary Hart on 04/22/23 by a audio enabled telemedicine application and verified that I am speaking with the correct person using two identifiers.  Patient Location: Home  Provider Location: Home Office  I discussed the limitations of evaluation and management by telemedicine. The patient expressed understanding and agreed to proceed.  Patient Medicare AWV questionnaire was completed by the patient on n/a; I have confirmed that all information answered by patient is correct and no changes since this date.  Review of Systems     Cardiac Risk Factors include: advanced age (>46men, >70 women);obesity (BMI >30kg/m2)     Objective:    Today's Vitals   04/22/23 0851 04/22/23 0852  Weight: 163 lb (73.9 kg)   Height: 5\' 1"  (1.549 m)   PainSc:  4    Body mass index is 30.8 kg/m.     04/22/2023    8:51 AM 04/06/2023   11:07 AM 12/07/2021    8:32 AM 11/26/2021   10:34 AM 03/24/2021    8:41 AM 04/28/2017   10:30 AM 01/21/2017    3:23 PM  Advanced Directives  Does Patient Have a Medical Advance Directive? No Yes Yes Yes Yes Yes No  Type of Special educational needs teacher of [place];Living will  Healthcare Power of [place];Living will Living will;Healthcare Power of [employer] Power of [place];Living will   Does patient want to make changes to medical advance directive?   No - Patient declined No - Patient declined     Copy of Healthcare Power of Attorney in Chart?    No - copy requested No - copy requested No - copy requested   Would patient like information on creating a medical advance directive? No - Patient declined      No - Patient declined    Current Medications (verified) Outpatient Encounter Medications as of 04/22/2023  Medication Sig   albuterol (VENTOLIN HFA) 108 (90 Base) MCG/ACT inhaler  Inhale 1-2 puffs into the lungs every 6 (six) hours as needed for wheezing or shortness of breath.   cetirizine (ZYRTEC) 10 MG tablet Take 10 mg by mouth at bedtime.   cholecalciferol (VITAMIN D3) 25 MCG (1000 UNIT) tablet Take 1,000 Units by mouth daily.   fluticasone (FLONASE) 50 MCG/ACT nasal spray Place into both nostrils daily.   fluticasone furoate-vilanterol (BREO ELLIPTA) 200-25 MCG/ACT AEPB Inhale 1 puff into the lungs daily.   levothyroxine (SYNTHROID) 75 MCG tablet Take 1 tablet (75 mcg total) by mouth daily before breakfast.   linaclotide (LINZESS) 72 MCG capsule Take 1 capsule (72 mcg total) by mouth daily before breakfast.   ALPRAZolam (XANAX) 0.25 MG tablet Take 1 tablet by mouth as needed for anxiety. (Patient not taking: Reported on 04/22/2023)   No facility-administered encounter medications on file as of 04/22/2023.    Allergies (verified) Oxycodone, Codeine, Hydrocodone, Hydromorphone hcl, Levonorgestrel-ethinyl estrad, Morphine, Moxifloxacin, and Sulfonamide derivatives   History: Past Medical History:  Diagnosis Date   Anxiety    Arthritis    oa   Clostridium difficile infection 2013, 2015   Treated with vancomycin   COPD (chronic obstructive pulmonary disease) (HCC)    minimal shortness of breath   Depression    Diverticula of colon    Genital herpes    GERD (gastroesophageal reflux disease)    Hypothyroid    IBS (irritable  bowel syndrome)    Internal hemorrhoids    Jaundice    as child cause unknown   Neuropathy    fingers both hands   PONV (postoperative nausea and vomiting)    did well with last neck fusion    Recurrent colitis due to Clostridium difficile 07/03/2013   4 treatments with vancomycin since late 2013/early 2014 - responds then relapses     Vocal cord dysfunction    SYASMONIC DYSPHONIA   Past Surgical History:  Procedure Laterality Date   ABDOMINAL HYSTERECTOMY     Anterior Cervical Fusion X 2     CHOLECYSTECTOMY     COLONOSCOPY   06/20/2008   [facility] Dr. Damita Dunnings external hemorrhoids   COLONOSCOPY  10/12/2012   Dr. Jena Gauss- internal hemorrhoids, very early, few, sigmoid diverticula o/w normal appearing colon and terminal ileum. + cdiff on stool samples taken. Segmental bx negative.   ESOPHAGOGASTRODUODENOSCOPY  11/25/10   Rourk- tubular esophagus s/p of 56 dilator from a small HH/otherwise normal   EYE SURGERY Bilateral 2012   ioc with lens replacement for cataracts   JOINT REPLACEMENT  09/2010   Thumb left   meniscus repair right knee  2014   NASAL SEPTOPLASTY W/ TURBINOPLASTY Bilateral 12/07/2021   Procedure: NASAL SEPTOPLASTY WITH TURBINATE REDUCTION;  Surgeon: Newman Pies, MD;  Location: [facility];  Service: ENT;  Laterality: Bilateral;   TOTAL KNEE ARTHROPLASTY Right 12/27/2016   Procedure: RIGHT TOTAL KNEE ARTHROPLASTY;  Surgeon: Ollen Gross, MD;  Location: [facility];  Service: Orthopedics;  Laterality: Right;  Adductor Block   VESICOVAGINAL FISTULA CLOSURE W/ TAH     Family History  Problem Relation Age of Onset   Dementia Mother    Breast cancer Mother    Dementia Father    Esophageal cancer Brother    Colon cancer Maternal Uncle    Breast cancer Cousin    Allergic rhinitis Neg Hx    Asthma Neg Hx    Eczema Neg Hx    Urticaria Neg Hx    Prostate cancer Neg Hx    Pancreatic cancer Neg Hx    Ovarian cancer Neg Hx    Endometrial cancer Neg Hx    Social History   Socioeconomic History   Marital status: Widowed    Spouse name: Not on file   Number of children: 4   Years of education: Not on file   Highest education level: Not on file  Occupational History   Occupation: Retired    Associate Professor: RETIRED  Tobacco Use   Smoking status: Former    Packs/day: 0.66    Years: 50.00    Additional pack years: 0.00    Total pack years: 33.00    Types: Cigarettes    Quit date: 2017    Years since quitting: 7.4   Smokeless tobacco: Never   Tobacco comments:    History of 1/2 pack per day  Vaping  Use   Vaping Use: Former  Substance and Sexual Activity   Alcohol use: Yes    Comment: occasional glass of wine   Drug use: No   Sexual activity: Not Currently  Other Topics Concern   Not on file  Social History Narrative   Married, 2 sons and 2 daughters   Husband chronically ill and she helps with care   She is retired   Chemical engineer Strain: Low Risk  (04/22/2023)   Overall Financial Resource Strain (CARDIA)    Difficulty of  Paying Living Expenses: Not hard at all  Food Insecurity: No Food Insecurity (04/22/2023)   Hunger Vital Sign    Worried About Running Out of Food in the Last Year: Never true    Ran Out of Food in the Last Year: Never true  Transportation Needs: No Transportation Needs (04/22/2023)   PRAPARE - Administrator, Civil Service (Medical): No    Lack of Transportation (Non-Medical): No  Physical Activity: Sufficiently Active (04/22/2023)   Exercise Vital Sign    Days of Exercise per Week: 4 days    Minutes of Exercise per Session: 60 min  Stress: No Stress Concern Present (04/22/2023)   [employer] of Occupational Health - Occupational Stress Questionnaire    Feeling of Stress : Not at all  Social Connections: Socially Integrated (04/22/2023)   Social Connection and Isolation Panel [NHANES]    Frequency of Communication with Friends and Family: More than three times a week    Frequency of Social Gatherings with Friends and Family: More than three times a week    Attends Religious Services: More than 4 times per year    Active Member of [employer] or Organizations: Yes    Attends Engineer, structural: More than 4 times per year    Marital Status: Married    Tobacco Counseling Counseling given: Yes Tobacco comments: History of 1/2 pack per day   Clinical Intake:  Pre-visit preparation completed: Yes  Pain : 0-10 Pain Score: 4  Pain Location:  (joints) Pain Orientation:  (all joints) Pain  Descriptors / Indicators: Constant Pain Onset: More than a month ago     BMI - recorded: 30.8 Nutritional Status: BMI > 30  Obese Nutritional Risks: None Diabetes: No  How often do you need to have someone help you when you read instructions, pamphlets, or other written materials from your doctor or pharmacy?: 1 - Never  Interpreter Needed?: No  Information entered by ::  Jevin Camino   Activities of Daily Living    04/22/2023    8:57 AM  In your present state of health, do you have any difficulty performing the following activities:  Hearing? 0  Vision? 0  Difficulty concentrating or making decisions? 0  Walking or climbing stairs? 0  Dressing or bathing? 0  Doing errands, shopping? 0  Preparing Food and eating ? N  Using the Toilet? N  In the past six months, have you accidently leaked urine? N  Do you have problems with loss of bowel control? N  Managing your Medications? N  Managing your Finances? N  Housekeeping or managing your Housekeeping? N    Patient Care Team: Tommie Sams, DO as PCP - General (Family Medicine) Jena Gauss, Gerrit Friends, MD (Gastroenterology)  Indicate any recent Medical Services you may have received from other than [facility] providers in the past year (date may be approximate).     Assessment:   This is a routine wellness examination for [place].  Hearing/Vision screen Hearing Screening - Comments:: Patient denies any hearing difficulties.    Dietary issues and exercise activities discussed:     Goals Addressed             This Visit's Progress    Patient Stated       Lose 20 pounds       Depression Screen    04/22/2023    8:56 AM 03/22/2023    2:35 PM 02/01/2023    2:14 PM 06/27/2013    3:33 PM  PHQ 2/9 Scores  PHQ - 2 Score 0 0 0 0  PHQ- 9 Score 0 2 1     Fall Risk    04/22/2023    8:56 AM 03/22/2023    2:35 PM 02/01/2023    2:15 PM 06/27/2013    3:33 PM  Fall Risk   Falls in the past year? 0 0 0 No  Number falls in past yr:  0 0 0   Injury with Fall? 0 0 0   Risk for fall due to : No Fall Risks  No Fall Risks   Follow up Falls prevention discussed  Falls evaluation completed     MEDICARE RISK AT HOME:  Medicare Risk at Home - 04/22/23 0853     Any stairs in or around the home? No    If so, are there any without handrails? No    Home free of loose throw rugs in walkways, pet beds, electrical cords, etc? Yes    Adequate lighting in your home to reduce risk of falls? Yes    Life alert? No    Use of a cane, walker or w/c? No    Grab bars in the bathroom? No    Shower chair or bench in shower? No    Elevated toilet seat or a handicapped toilet? No             TIMED UP AND GO:  Was the test performed?  No    Cognitive Function:        04/22/2023    8:54 AM  6CIT Screen  What Year? 0 points  What month? 0 points  What time? 0 points  Count back from 20 0 points  Months in reverse 0 points  Repeat phrase 0 points  Total Score 0 points    Immunizations Immunization History  Administered Date(s) Administered   Influenza, High Dose Seasonal PF 08/29/2018, 07/29/2019   Influenza-Unspecified 08/25/2018   PFIZER(Purple Top)SARS-COV-2 Vaccination 11/24/2019, 12/15/2019, 07/24/2020    TDAP status: Due, Education has been provided regarding the importance of this vaccine. Advised may receive this vaccine at local pharmacy or Health Dept. Aware to provide a copy of the vaccination record if obtained from local pharmacy or Health Dept. Verbalized acceptance and understanding.  Flu Vaccine status: Up to date  Pneumococcal vaccine status: Due, Education has been provided regarding the importance of this vaccine. Advised may receive this vaccine at local pharmacy or Health Dept. Aware to provide a copy of the vaccination record if obtained from local pharmacy or Health Dept. Verbalized acceptance and understanding.  Covid-19 vaccine status: Information provided on how to obtain vaccines.   Qualifies  for Shingles Vaccine? Yes   Zostavax completed No   Shingrix Completed?: No.    Education has been provided regarding the importance of this vaccine. Patient has been advised to call insurance company to determine out of pocket expense if they have not yet received this vaccine. Advised may also receive vaccine at local pharmacy or Health Dept. Verbalized acceptance and understanding.  Screening Tests Health Maintenance  Topic Date Due   Pneumonia Vaccine 50+ Years old (1 of 2 - PCV) Never done   DTaP/Tdap/Td (1 - Tdap) Never done   Zoster Vaccines- Shingrix (1 of 2) Never done   DEXA SCAN  Never done   Lung Cancer Screening  03/24/2022   COVID-19 Vaccine (4 - 2023-24 season) 06/25/2022   Medicare Annual Wellness (AWV)  02/02/2023   INFLUENZA VACCINE  05/26/2023  HPV VACCINES  Aged Out   Colonoscopy  Discontinued    Health Maintenance  Health Maintenance Due  Topic Date Due   Pneumonia Vaccine 61+ Years old (1 of 2 - PCV) Never done   DTaP/Tdap/Td (1 - Tdap) Never done   Zoster Vaccines- Shingrix (1 of 2) Never done   DEXA SCAN  Never done   Lung Cancer Screening  03/24/2022   COVID-19 Vaccine (4 - 2023-24 season) 06/25/2022   Medicare Annual Wellness (AWV)  02/02/2023    Colorectal cancer screening: No longer required.   Mammogram status: Ordered 04/22/23. Pt provided with contact info and advised to call to schedule appt.   Bone Density status: Ordered 04/22/23. Pt provided with contact info and advised to call to schedule appt.  Lung Cancer Screening: (Low Dose CT Chest recommended if Age 14-80 years, 20 pack-year currently smoking OR have quit w/in 15years.) does qualify.   Lung Cancer Screening Referral: 04/22/23  Additional Screening:  Hepatitis C Screening: does not qualify;  Vision Screening: Recommended annual ophthalmology exams for early detection of glaucoma and other disorders of the eye. Is the patient up to date with their annual eye exam?  Yes  Who is the  provider or what is the name of the office in which the patient attends annual eye exams? Dr. Daphine Deutscher If pt is not established with a provider, would they like to be referred to a provider to establish care? No .   Dental Screening: Recommended annual dental exams for proper oral hygiene  Diabetic Foot Exam: n/a  Community Resource Referral / Chronic Care Management: CRR required this visit?  No   CCM required this visit?  No     Plan:     I have personally reviewed and noted the following in the patient's chart:   Medical and social history Use of alcohol, tobacco or illicit drugs  Current medications and supplements including opioid prescriptions. Patient is not currently taking opioid prescriptions. Functional ability and status Nutritional status Physical activity Advanced directives List of other physicians Hospitalizations, surgeries, and ER visits in previous 12 months Vitals Screenings to include cognitive, depression, and falls Referrals and appointments  In addition, I have reviewed and discussed with patient certain preventive protocols, quality metrics, and best practice recommendations. A written personalized care plan for preventive services as well as general preventive health recommendations were provided to patient.   Because this visit was a virtual/telehealth visit,  certain criteria was not obtained, such a blood pressure, CBG if patient is a diabetic, and timed up and go.    Jordan Hawks Lucienne Sawyers, CMA   04/22/2023   After Visit Summary: (MyChart) Due to this being a telephonic visit, the after visit summary with patients personalized plan was offered to patient via MyChart   Nurse Notes: mammo and dexa ordered

## 2023-04-22 NOTE — Patient Instructions (Addendum)
Mary Hart , Thank you for taking time to come for your Medicare Wellness Visit. I appreciate your ongoing commitment to your health goals. Please review the following plan we discussed and let me know if I can assist you in the future.   These are the goals we discussed:  Goals      Patient Stated     Lose 20 pounds        This is a list of the screening recommended for you and due dates:  Health Maintenance  Topic Date Due   Pneumonia Vaccine (1 of 2 - PCV) Never done   DTaP/Tdap/Td vaccine (1 - Tdap) Never done   Zoster (Shingles) Vaccine (1 of 2) Never done   DEXA scan (bone density measurement)  Never done   Screening for Lung Cancer  03/24/2022   COVID-19 Vaccine (4 - 2023-24 season) 06/25/2022   Flu Shot  05/26/2023   Medicare Annual Wellness Visit  04/21/2024   HPV Vaccine  Aged Out   Colon Cancer Screening  Discontinued    Advanced directives: Advance directive discussed with you today. Even though you declined this today, please call our office should you change your mind, and we can give you the proper paperwork for you to fill out. Advance care planning is a way to make decisions about medical care that fits your values in case you are ever unable to make these decisions for yourself.  Information on Advanced Care Planning can be found at [facility] Advance Health Care Directives Advance Health Care Directives ([email or web address]    Conditions/risks identified:  You have an order for:  []   2D Mammogram  [x]   3D Mammogram  [x]   Bone Density  [x]   Lung Cancer Screening   Please call for appointment:   [facility] [employer] [facility] [phone]  [facility]  [address] [place] [address] [phone]  [facility] [address] [place], [place] [address] [phone]  [facility] [address] Geanie Logan [place], [place] [address] [phone]  [facility] [address] [place], [place] [address] [phone]  [facility] [address] [place], [place] [address] [phone]  [facility] [place] [address] [place], [place] [address] [phone]  [facility] [place] Bone Density [address] [place], [place] [address] [phone]  [facility] [address] [place], [place] [address] [phone]  [facility] at [facility] [address] [place], [place] [address] [phone]  [facility] [address] [place], [place] [address] [phone]  [facility] [address] [place], [place] [address] [phone]  [facility] [address] [place], [place] [address] [phone]  [facility] [place] [address] [place], [place] [address] [phone]  [facility] [address] [place], [place] [address] [phone] [facility]  [facility] at [facility] [address] -Radiology [place], [place] [address] [phone]  [facility] [address], [place] [address] [phone]   Make sure to wear two-piece clothing.  No lotions powders or deodorants the day of the appointment Make sure to bring  picture ID and insurance card.  Bring list of medications you are currently taking including any supplements.   Schedule your [place] screening mammogram through MyChart!   Log into your MyChart account.  Go to 'Visit' (or 'Appointments' if on mobile App) --> Schedule an Appointment  Under 'Select a Reason for Visit' choose the Mammogram Screening  option.  Complete the pre-visit questions and select the time and place that best fits your schedule.    Next appointment: VIRTUAL/TELEPHONE APPOINTMENT Follow up in one year for your annual wellness visit  May 04, 2024 at 8:30am telephone visit   Preventive Care 65 Years and Older, Female Preventive care refers to lifestyle choices and visits with your health care provider that can promote health and wellness. What does preventive care include? A yearly physical exam. This is also called an annual well check. Dental exams once or twice a year. Routine eye exams. Ask your health care provider how often you should have your eyes checked. Personal lifestyle choices, including: Daily care of your teeth and gums. Regular physical activity. Eating a healthy diet. Avoiding tobacco and drug use. Limiting alcohol use. Practicing safe sex. Taking low-dose aspirin every day. Taking vitamin and mineral supplements as recommended by your health care provider. What happens during an annual well check? The services and screenings done by your health care provider during your annual well check will depend on your age, overall health, lifestyle risk factors, and family history of disease. Counseling  Your health care provider may ask you questions about your: Alcohol use. Tobacco use. Drug use. Emotional well-being. Home and relationship well-being. Sexual activity. Eating habits. History of falls. Memory and ability to understand (cognition). Work and work Astronomer. Reproductive health. Screening  You may have the following tests or measurements: Height, weight, and BMI. Blood pressure. Lipid and cholesterol levels. These may be checked every 5 years, or more frequently if you are over 66 years old. Skin check. Lung cancer screening. You may have this screening every year starting at age 51 if you have a 30-pack-year history of smoking and currently smoke or have quit within the  past 15 years. Fecal occult blood test (FOBT) of the stool. You may have this test every year starting at age 22. Flexible sigmoidoscopy or colonoscopy. You may have a sigmoidoscopy every 5 years or a colonoscopy every 10 years starting at age 35. Hepatitis C blood test. Hepatitis B blood test. Sexually transmitted disease (STD) testing. Diabetes screening. This is done by checking your blood sugar (glucose) after you have not eaten for a while (fasting). You may have this done every 1-3 years. Bone density scan. This is done to screen for osteoporosis. You may have this done starting at age 76. Mammogram. This may be done every 1-2 years. Talk to your health care provider about how often you should have regular mammograms. Talk with your health care provider about your test results, treatment options, and if necessary, the need for more tests. Vaccines  Your health care provider may recommend certain vaccines, such as: Influenza vaccine. This is recommended every year. Tetanus, diphtheria, and acellular pertussis (Tdap, Td) vaccine. You may need a Td booster every 10 years. Zoster vaccine. You may need this after age 44. Pneumococcal 13-valent conjugate (PCV13) vaccine. One dose is recommended after age 48. Pneumococcal polysaccharide (PPSV23) vaccine. One dose is recommended after age 33. Talk to your health care provider about which screenings and vaccines you need and how often you need them. This  information is not intended to replace advice given to you by your health care provider. Make sure you discuss any questions you have with your health care provider. Document Released: 11/07/2015 Document Revised: 06/30/2016 Document Reviewed: 08/12/2015 Elsevier Interactive Patient Education  2017 [employer].  Fall Prevention in the Home Falls can cause injuries. They can happen to people of all ages. There are many things you can do to make your home safe and to help prevent falls. What can  I do on the outside of my home? Regularly fix the edges of walkways and driveways and fix any cracks. Remove anything that might make you trip as you walk through a door, such as a raised step or threshold. Trim any bushes or trees on the path to your home. Use bright outdoor lighting. Clear any walking paths of anything that might make someone trip, such as rocks or tools. Regularly check to see if handrails are loose or broken. Make sure that both sides of any steps have handrails. Any raised decks and porches should have guardrails on the edges. Have any leaves, snow, or ice cleared regularly. Use sand or salt on walking paths during winter. Clean up any spills in your garage right away. This includes oil or grease spills. What can I do in the bathroom? Use night lights. Install grab bars by the toilet and in the tub and shower. Do not use towel bars as grab bars. Use non-skid mats or decals in the tub or shower. If you need to sit down in the shower, use a plastic, non-slip stool. Keep the floor dry. Clean up any water that spills on the floor as soon as it happens. Remove soap buildup in the tub or shower regularly. Attach bath mats securely with double-sided non-slip rug tape. Do not have throw rugs and other things on the floor that can make you trip. What can I do in the bedroom? Use night lights. Make sure that you have a light by your bed that is easy to reach. Do not use any sheets or blankets that are too big for your bed. They should not hang down onto the floor. Have a firm chair that has side arms. You can use this for support while you get dressed. Do not have throw rugs and other things on the floor that can make you trip. What can I do in the kitchen? Clean up any spills right away. Avoid walking on wet floors. Keep items that you use a lot in easy-to-reach places. If you need to reach something above you, use a strong step stool that has a grab bar. Keep electrical  cords out of the way. Do not use floor polish or wax that makes floors slippery. If you must use wax, use non-skid floor wax. Do not have throw rugs and other things on the floor that can make you trip. What can I do with my stairs? Do not leave any items on the stairs. Make sure that there are handrails on both sides of the stairs and use them. Fix handrails that are broken or loose. Make sure that handrails are as long as the stairways. Check any carpeting to make sure that it is firmly attached to the stairs. Fix any carpet that is loose or worn. Avoid having throw rugs at the top or bottom of the stairs. If you do have throw rugs, attach them to the floor with carpet tape. Make sure that you have a light switch at the top  of the stairs and the bottom of the stairs. If you do not have them, ask someone to add them for you. What else can I do to help prevent falls? Wear shoes that: Do not have high heels. Have rubber bottoms. Are comfortable and fit you well. Are closed at the toe. Do not wear sandals. If you use a stepladder: Make sure that it is fully opened. Do not climb a closed stepladder. Make sure that both sides of the stepladder are locked into place. Ask someone to hold it for you, if possible. Clearly mark and make sure that you can see: Any grab bars or handrails. First and last steps. Where the edge of each step is. Use tools that help you move around (mobility aids) if they are needed. These include: Canes. Walkers. Scooters. Crutches. Turn on the lights when you go into a dark area. Replace any light bulbs as soon as they burn out. Set up your furniture so you have a clear path. Avoid moving your furniture around. If any of your floors are uneven, fix them. If there are any pets around you, be aware of where they are. Review your medicines with your doctor. Some medicines can make you feel dizzy. This can increase your chance of falling. Ask your doctor what other  things that you can do to help prevent falls. This information is not intended to replace advice given to you by your health care provider. Make sure you discuss any questions you have with your health care provider. Document Released: 08/07/2009 Document Revised: 03/18/2016 Document Reviewed: 11/15/2014 Elsevier Interactive Patient Education  2017 [employer].

## 2023-04-25 DIAGNOSIS — H353113 Nonexudative age-related macular degeneration, right eye, advanced atrophic without subfoveal involvement: Secondary | ICD-10-CM | POA: Diagnosis not present

## 2023-04-25 DIAGNOSIS — H353133 Nonexudative age-related macular degeneration, bilateral, advanced atrophic without subfoveal involvement: Secondary | ICD-10-CM | POA: Diagnosis not present

## 2023-05-03 ENCOUNTER — Encounter: Payer: Self-pay | Admitting: Gynecologic Oncology

## 2023-05-04 ENCOUNTER — Telehealth: Payer: Self-pay

## 2023-05-04 NOTE — Telephone Encounter (Signed)
Pt is calling she did a wellness vist and was told they would place a order for a mammogram and a bone density test to be done and no referral has been placed can this get taken care of?   Urban Gibson [phone]

## 2023-05-04 NOTE — Telephone Encounter (Signed)
Orders were placed by Wellness nurse. Patient to call and schedule her own appointments

## 2023-05-10 ENCOUNTER — Ambulatory Visit (HOSPITAL_COMMUNITY)
Admission: RE | Admit: 2023-05-10 | Discharge: 2023-05-10 | Disposition: A | Discharge status: Home / Self Care | Payer: PPO | Source: Ambulatory Visit | Attending: Family Medicine | Admitting: Family Medicine

## 2023-05-10 ENCOUNTER — Ambulatory Visit (INDEPENDENT_AMBULATORY_CARE_PROVIDER_SITE_OTHER): Payer: PPO | Admitting: Family Medicine

## 2023-05-10 ENCOUNTER — Encounter: Payer: Self-pay | Admitting: Family Medicine

## 2023-05-10 VITALS — BP 132/62 | HR 97 | Temp 97.5°F | Ht 61.0 in | Wt 163.0 lb

## 2023-05-10 DIAGNOSIS — K589 Irritable bowel syndrome without diarrhea: Secondary | ICD-10-CM

## 2023-05-10 DIAGNOSIS — K5900 Constipation, unspecified: Secondary | ICD-10-CM | POA: Insufficient documentation

## 2023-05-10 NOTE — Patient Instructions (Signed)
Continue the linzess.  Labs and xray today.  Referral is in.  Follow up in 1 month.

## 2023-05-11 LAB — CMP14+EGFR
ALT: 22 IU/L (ref 0–32)
AST: 21 IU/L (ref 0–40)
Albumin: 4.2 g/dL (ref 3.8–4.8)
Alkaline Phosphatase: 91 IU/L (ref 44–121)
BUN/Creatinine Ratio: 11 — ABNORMAL LOW (ref 12–28)
BUN: 9 mg/dL (ref 8–27)
Bilirubin Total: <0.2 mg/dL (ref 0.0–1.2)
CO2: 25 mmol/L (ref 20–29)
Calcium: 9.1 mg/dL (ref 8.7–10.3)
Chloride: 100 mmol/L (ref 96–106)
Creatinine, Ser: 0.83 mg/dL (ref 0.57–1.00)
Globulin, Total: 2 g/dL (ref 1.5–4.5)
Glucose: 85 mg/dL (ref 70–99)
Potassium: 5 mmol/L (ref 3.5–5.2)
Sodium: 139 mmol/L (ref 134–144)
Total Protein: 6.2 g/dL (ref 6.0–8.5)
eGFR: 71 mL/min/{1.73_m2} (ref >59)

## 2023-05-11 LAB — CBC
Hematocrit: 42.2 % (ref 34.0–46.6)
Hemoglobin: 13.9 g/dL (ref 11.1–15.9)
MCH: 30 pg (ref 26.6–33.0)
MCHC: 32.9 g/dL (ref 31.5–35.7)
MCV: 91 fL (ref 79–97)
Platelets: 303 10*3/uL (ref 150–450)
RBC: 4.63 x10E6/uL (ref 3.77–5.28)
RDW: 13.1 % (ref 11.7–15.4)
WBC: 7.9 10*3/uL (ref 3.4–10.8)

## 2023-05-11 NOTE — Assessment & Plan Note (Signed)
Labs and KUB today. Referring to GI. Continuing with Linzess at this time. Offered increase in dose and patient reports 290 gives diarrhea.

## 2023-05-11 NOTE — Progress Notes (Signed)
Subjective:  Patient ID: Mary Hart, female    DOB: 1941-04-19  Age: 82 y.o. MRN: [id number]  CC: Chief Complaint  Patient presents with   Constipation    X 3 weeks, hemerrhoids, taking dulcolax, magnesium citrate, linzess x 2 weeks  gas   Hyperlipidemia    Nausea, sleeping approx 10 hrs per day    HPI:  82 year old female with history of IBS/chronic constipation presents with persistent constipation.  Reports persistent constipation, bloating, gas. Also reports nausea. Has been taking mag citrate, linzess, dulcolax without significant improvement. Had recent CT at the end of may with no significant bowel findings. No fever. Does have abdominal discomfort. She is very concerned and doesn't know what to do.   Patient Active Problem List   Diagnosis Date Noted   History of hysterectomy 04/07/2023   Diverticulosis 04/07/2023   History of Clostridioides difficile infection 04/07/2023   Constipation 03/22/2023   COPD (chronic obstructive pulmonary disease) (HCC) 02/02/2023   Difficulty voiding 02/02/2023   Adnexal mass 02/02/2023   Hypothyroidism 02/02/2023   Aortic atherosclerosis (HCC) 02/01/2023   History of total knee replacement, right 03/28/2019   OA (osteoarthritis) of knee 12/27/2016   Irritable bowel syndrome 05/04/2012   GERD (gastroesophageal reflux disease) 02/14/2012    Social Hx   Social History   Socioeconomic History   Marital status: Widowed    Spouse name: Not on file   Number of children: 4   Years of education: Not on file   Highest education level: Not on file  Occupational History   Occupation: Retired    Associate Professor: RETIRED  Tobacco Use   Smoking status: Former    Current packs/day: 0.00    Average packs/day: 0.7 packs/day for 50.0 years (33.0 ttl pk-yrs)    Types: Cigarettes    Start date: 77    Quit date: 2017    Years since quitting: 7.5   Smokeless tobacco: Never   Tobacco comments:    History of 1/2 pack per day  Vaping Use    Vaping status: Former  Substance and Sexual Activity   Alcohol use: Yes    Comment: occasional glass of wine   Drug use: No   Sexual activity: Not Currently  Other Topics Concern   Not on file  Social History Narrative   Married, 2 sons and 2 daughters   Husband chronically ill and she helps with care   She is retired   International aid/development worker of Corporate investment banker Strain: Low Risk  (04/22/2023)   Overall Financial Resource Strain (CARDIA)    Difficulty of Paying Living Expenses: Not hard at all  Food Insecurity: No Food Insecurity (04/22/2023)   Hunger Vital Sign    Worried About Running Out of Food in the Last Year: Never true    Ran Out of Food in the Last Year: Never true  Transportation Needs: No Transportation Needs (04/22/2023)   PRAPARE - Administrator, Civil Service (Medical): No    Lack of Transportation (Non-Medical): No  Physical Activity: Sufficiently Active (04/22/2023)   Exercise Vital Sign    Days of Exercise per Week: 4 days    Minutes of Exercise per Session: 60 min  Stress: No Stress Concern Present (04/22/2023)   [employer] of Occupational Health - Occupational Stress Questionnaire    Feeling of Stress : Not at all  Social Connections: Socially Integrated (04/22/2023)   Social Connection and Isolation Panel [NHANES]    Frequency  of Communication with Friends and Family: More than three times a week    Frequency of Social Gatherings with Friends and Family: More than three times a week    Attends Religious Services: More than 4 times per year    Active Member of [employer] or Organizations: Yes    Attends Engineer, structural: More than 4 times per year    Marital Status: Married    Review of Systems Per HPI  Objective:  BP 132/62   Pulse 97   Temp (!) 97.5 F (36.4 C)   Ht 5\' 1"  (1.549 m)   Wt 163 lb (73.9 kg)   SpO2 97%   BMI 30.80 kg/m      05/10/2023    2:43 PM 04/22/2023    8:51 AM 04/07/2023    9:34 AM   BP/Weight  Systolic BP 132 -- 142  Diastolic BP 62 -- 52  Wt. (Lbs) 163 163 163.4  BMI 30.8 kg/m2 30.8 kg/m2 30.26 kg/m2    Physical Exam Vitals and nursing note reviewed.  Constitutional:      Appearance: Normal appearance.  Cardiovascular:     Rate and Rhythm: Normal rate and regular rhythm.  Pulmonary:     Effort: Pulmonary effort is normal.     Breath sounds: Normal breath sounds.  Abdominal:     General: There is no distension.     Palpations: Abdomen is soft.     Comments: Diffusely tender to palpation. Hypoactive bowel sounds.   Neurological:     Mental Status: She is alert.  Psychiatric:        Mood and Affect: Mood normal.        Behavior: Behavior normal.     Lab Results  Component Value Date   WBC 7.9 05/10/2023   HGB 13.9 05/10/2023   HCT 42.2 05/10/2023   PLT 303 05/10/2023   GLUCOSE 85 05/10/2023   CHOL 195 02/01/2023   TRIG 102 02/01/2023   HDL 98 02/01/2023   LDLCALC 79 02/01/2023   ALT 22 05/10/2023   AST 21 05/10/2023   NA 139 05/10/2023   K 5.0 05/10/2023   CL 100 05/10/2023   CREATININE 0.83 05/10/2023   BUN 9 05/10/2023   CO2 25 05/10/2023   TSH 0.920 02/01/2023   INR 0.93 12/21/2016     Assessment & Plan:   Problem List Items Addressed This Visit       Digestive   Irritable bowel syndrome   Relevant Orders   Ambulatory referral to Gastroenterology   CBC (Completed)   CMP14+EGFR (Completed)     Other   Constipation - Primary    Labs and KUB today. Referring to GI. Continuing with Linzess at this time. Offered increase in dose and patient reports 290 gives diarrhea.      Relevant Orders   Ambulatory referral to Gastroenterology   CBC (Completed)   CMP14+EGFR (Completed)   DG Abd 1 View (Completed)    Follow-up:  Return in about 1 month (around 06/10/2023).  Everlene Other DO [facility]: [facility]

## 2023-05-12 ENCOUNTER — Other Ambulatory Visit: Payer: Self-pay

## 2023-05-16 DIAGNOSIS — M25562 Pain in left knee: Secondary | ICD-10-CM | POA: Diagnosis not present

## 2023-05-27 ENCOUNTER — Encounter: Payer: Self-pay | Admitting: Internal Medicine

## 2023-05-27 ENCOUNTER — Ambulatory Visit: Payer: PPO | Admitting: Internal Medicine

## 2023-05-27 ENCOUNTER — Other Ambulatory Visit: Payer: Self-pay | Admitting: Pulmonary Disease

## 2023-05-27 VITALS — BP 127/80 | HR 101 | Ht 61.0 in | Wt 164.0 lb

## 2023-05-27 DIAGNOSIS — R197 Diarrhea, unspecified: Secondary | ICD-10-CM | POA: Diagnosis not present

## 2023-05-27 DIAGNOSIS — K5790 Diverticulosis of intestine, part unspecified, without perforation or abscess without bleeding: Secondary | ICD-10-CM

## 2023-05-27 DIAGNOSIS — M6289 Other specified disorders of muscle: Secondary | ICD-10-CM

## 2023-05-27 DIAGNOSIS — K581 Irritable bowel syndrome with constipation: Secondary | ICD-10-CM

## 2023-05-27 MED ORDER — LINACLOTIDE 145 MCG PO CAPS: 145.0000 ug | ORAL_CAPSULE | Freq: Every day | ORAL | 3 refills | Status: DC

## 2023-05-27 NOTE — Progress Notes (Signed)
Primary Care Physician:  Tommie Sams, DO Primary Gastroenterologist:  Dr. Marletta Lor  Chief Complaint  Patient presents with   Irritable Bowel Syndrome    Referred for IBS with constipation. Takes linzess daily. Has doubled the dosage and no relief. Tried dulcolax.     HPI:   Mary Hart is a 82 y.o. female who presents to clinic today by referral from her PCP Dr. Adriana Simas for evaluation.  She has had history of chronic constipation for many years.  Previously seen by [facility] 5 years ago.  History of pelvic floor dysfunction, completed physical therapy which did not help.  Trial of Amitiza unsuccessful.  Trulance not covered by her insurance.  Has tried Dulcolax, MiraLAX over-the-counter without relief.  Currently taking Linzess 72 mcg daily.  States 145 mcg caused diarrhea though she states she stopped taking this within a few days.  Note she has had progressively worsening constipation over the last 3 months.  Sometimes will go up to a week without a bowel movement.  Occasionally takes Dulcolax on top of her Linzess to help.  Notes feelings of incomplete evacuation.  No melena hematochezia.  KUB 05/10/2023 which I personally reviewed shows stool throughout her colon.  Colonoscopy June 2019 with sigmoid diverticulosis as well as slight narrowing associated with diverticulosis.  No polyps.  Also notes recently worsening of reflux.  Taking Gas-X regularly.  Symptoms worse in the morning, chronic, daily, mild, notes some epigastric discomfort.  No dysphagia odynophagia.   Past Medical History:  Diagnosis Date   Anxiety    Arthritis    oa   Clostridium difficile infection 2013, 2015   Treated with vancomycin   COPD (chronic obstructive pulmonary disease) (HCC)    minimal shortness of breath   Depression    Diverticula of colon    Genital herpes    GERD (gastroesophageal reflux disease)    Hypothyroid    IBS (irritable bowel syndrome)    Internal hemorrhoids     Jaundice    as child cause unknown   Neuropathy    fingers both hands   PONV (postoperative nausea and vomiting)    did well with last neck fusion    Recurrent colitis due to Clostridium difficile 07/03/2013   4 treatments with vancomycin since late 2013/early 2014 - responds then relapses     Vocal cord dysfunction    SYASMONIC DYSPHONIA    Past Surgical History:  Procedure Laterality Date   ABDOMINAL HYSTERECTOMY     Anterior Cervical Fusion X 2     CHOLECYSTECTOMY     COLONOSCOPY  06/20/2008   [facility] Dr. Damita Dunnings external hemorrhoids   COLONOSCOPY  10/12/2012   Dr. Jena Gauss- internal hemorrhoids, very early, few, sigmoid diverticula o/w normal appearing colon and terminal ileum. + cdiff on stool samples taken. Segmental bx negative.   ESOPHAGOGASTRODUODENOSCOPY  11/25/10   Rourk- tubular esophagus s/p of 56 dilator from a small HH/otherwise normal   EYE SURGERY Bilateral 2012   ioc with lens replacement for cataracts   JOINT REPLACEMENT  09/2010   Thumb left   meniscus repair right knee  2014   NASAL SEPTOPLASTY W/ TURBINOPLASTY Bilateral 12/07/2021   Procedure: NASAL SEPTOPLASTY WITH TURBINATE REDUCTION;  Surgeon: Newman Pies, MD;  Location: [facility];  Service: ENT;  Laterality: Bilateral;   TOTAL KNEE ARTHROPLASTY Right 12/27/2016   Procedure: RIGHT TOTAL KNEE ARTHROPLASTY;  Surgeon: Ollen Gross, MD;  Location: [facility];  Service: Orthopedics;  Laterality:  Right;  Adductor Block   VESICOVAGINAL FISTULA CLOSURE W/ TAH      Current Outpatient Medications  Medication Sig Dispense Refill   albuterol (VENTOLIN HFA) 108 (90 Base) MCG/ACT inhaler Inhale 1-2 puffs into the lungs every 6 (six) hours as needed for wheezing or shortness of breath.     ALPRAZolam (XANAX) 0.25 MG tablet Take 1 tablet by mouth as needed for anxiety. 30 tablet 0   Aspirin-Salicylamide-Caffeine (ARTHRITIS STRENGTH BC POWDER PO) Take by mouth. One daily     cetirizine (ZYRTEC) 10 MG tablet Take 10  mg by mouth at bedtime.     cholecalciferol (VITAMIN D3) 25 MCG (1000 UNIT) tablet Take 1,000 Units by mouth daily.     fluticasone (FLONASE) 50 MCG/ACT nasal spray Place into both nostrils daily.     fluticasone furoate-vilanterol (BREO ELLIPTA) 200-25 MCG/ACT AEPB Inhale 1 puff into the lungs daily. 90 each 2   levothyroxine (SYNTHROID) 75 MCG tablet Take 1 tablet (75 mcg total) by mouth daily before breakfast. 90 tablet 1   linaclotide (LINZESS) 72 MCG capsule Take 1 capsule (72 mcg total) by mouth daily before breakfast. 90 capsule 1   No current facility-administered medications for this visit.    Allergies as of 05/27/2023 - Review Complete 05/27/2023  Allergen Reaction Noted   Oxycodone Other (See Comments) and Itching 11/05/2022   Codeine Nausea And Vomiting    Hydrocodone  08/19/2019   Hydromorphone hcl Itching    Levonorgestrel-ethinyl estrad Other (See Comments) 11/05/2022   Morphine Nausea And Vomiting    Moxifloxacin Other (See Comments) 11/05/2022   Sulfonamide derivatives Other (See Comments)     Family History  Problem Relation Age of Onset   Dementia Mother    Breast cancer Mother    Dementia Father    Esophageal cancer Brother    Colon cancer Maternal Uncle    Breast cancer Cousin    Allergic rhinitis Neg Hx    Asthma Neg Hx    Eczema Neg Hx    Urticaria Neg Hx    Prostate cancer Neg Hx    Pancreatic cancer Neg Hx    Ovarian cancer Neg Hx    Endometrial cancer Neg Hx     Social History   Socioeconomic History   Marital status: Widowed    Spouse name: Not on file   Number of children: 4   Years of education: Not on file   Highest education level: Not on file  Occupational History   Occupation: Retired    Associate Professor: RETIRED  Tobacco Use   Smoking status: Former    Current packs/day: 0.00    Average packs/day: 0.7 packs/day for 50.0 years (33.0 ttl pk-yrs)    Types: Cigarettes    Start date: 73    Quit date: 2017    Years since quitting: 7.5    Smokeless tobacco: Never   Tobacco comments:    History of 1/2 pack per day  Vaping Use   Vaping status: Former  Substance and Sexual Activity   Alcohol use: Yes    Comment: occasional glass of wine   Drug use: No   Sexual activity: Not Currently  Other Topics Concern   Not on file  Social History Narrative   Married, 2 sons and 2 daughters   Husband chronically ill and she helps with care   She is retired   Chemical engineer Strain: Low Risk  (04/22/2023)   Overall Financial Resource Strain (CARDIA)  Difficulty of Paying Living Expenses: Not hard at all  Food Insecurity: No Food Insecurity (04/22/2023)   Hunger Vital Sign    Worried About Running Out of Food in the Last Year: Never true    Ran Out of Food in the Last Year: Never true  Transportation Needs: No Transportation Needs (04/22/2023)   PRAPARE - Administrator, Civil Service (Medical): No    Lack of Transportation (Non-Medical): No  Physical Activity: Sufficiently Active (04/22/2023)   Exercise Vital Sign    Days of Exercise per Week: 4 days    Minutes of Exercise per Session: 60 min  Stress: No Stress Concern Present (04/22/2023)   [employer] of Occupational Health - Occupational Stress Questionnaire    Feeling of Stress : Not at all  Social Connections: Socially Integrated (04/22/2023)   Social Connection and Isolation Panel [NHANES]    Frequency of Communication with Friends and Family: More than three times a week    Frequency of Social Gatherings with Friends and Family: More than three times a week    Attends Religious Services: More than 4 times per year    Active Member of [employer] or Organizations: Yes    Attends Engineer, structural: More than 4 times per year    Marital Status: Married  Catering manager Violence: Not At Risk (04/22/2023)   Humiliation, Afraid, Rape, and Kick questionnaire    Fear of Current or Ex-Partner: No    Emotionally  Abused: No    Physically Abused: No    Sexually Abused: No    Subjective: Review of Systems  Constitutional:  Negative for chills and fever.  HENT:  Negative for congestion and hearing loss.   Eyes:  Negative for blurred vision and double vision.  Respiratory:  Negative for cough and shortness of breath.   Cardiovascular:  Negative for chest pain and palpitations.  Gastrointestinal:  Positive for abdominal pain, constipation and heartburn. Negative for blood in stool, diarrhea, melena and vomiting.  Genitourinary:  Negative for dysuria and urgency.  Musculoskeletal:  Negative for joint pain and myalgias.  Skin:  Negative for itching and rash.  Neurological:  Negative for dizziness and headaches.  Psychiatric/Behavioral:  Negative for depression. The patient is not nervous/anxious.        Objective: BP 127/80 (BP Location: Left Arm, Patient Position: Sitting, Cuff Size: Large)   Pulse (!) 101   Ht 5\' 1"  (1.549 m)   Wt 164 lb (74.4 kg)   BMI 30.99 kg/m  Physical Exam Constitutional:      Appearance: Normal appearance.  HENT:     Head: Normocephalic and atraumatic.  Eyes:     Extraocular Movements: Extraocular movements intact.     Conjunctiva/sclera: Conjunctivae normal.  Cardiovascular:     Rate and Rhythm: Normal rate and regular rhythm.  Pulmonary:     Effort: Pulmonary effort is normal.     Breath sounds: Normal breath sounds.  Abdominal:     General: Bowel sounds are normal.     Palpations: Abdomen is soft.  Musculoskeletal:        General: No swelling. Normal range of motion.     Cervical back: Normal range of motion and neck supple.  Skin:    General: Skin is warm and dry.     Coloration: Skin is not jaundiced.  Neurological:     General: No focal deficit present.     Mental Status: She is alert and oriented to person, place, and time.  Psychiatric:        Mood and Affect: Mood normal.        Behavior: Behavior normal.      Assessment: *Irritable  bowel syndrome-constipation predominant *Pelvic floor dysfunction *Abdominal bloating *Diverticulosis *Chronic GERD  Plan: Patient presenting with symptoms consistent with irritable bowel syndrome, constipation predominant in the setting of pelvic floor dysfunction, incomplete bowel evacuation.  Recent KUB with stool burden throughout her colon.  Will give MiraLAX cleanse today and increase Linzess to 145 mcg to take day after.  Stopped this previously due to ongoing diarrhea but it does not sound like she ever made it through the initial "washout period"  GERD symptoms worsening, recommend she start taking Pepcid once daily and see how she does.  Follow-up in 6 weeks.  Thank you Dr. Adriana Simas for the kind referral  05/27/2023 10:05 AM   Disclaimer: This note was dictated with voice recognition software. Similar sounding words can inadvertently be transcribed and may not be corrected upon review.

## 2023-05-27 NOTE — Patient Instructions (Addendum)
I want you to perform a bowel cleanse with MiraLAX:  Mix 1 bottle of MiraLAX (polyethylene glycol, 8.3 oz, 238g) into 64 ounces of water or Gatorade.  Drink one 8 ounce cup every 30 minutes to an hour until bowels completely empty.  After you have completely emptying your bowels, would start Linzess the next day 145 mcg.  I sent this to your pharmacy.  You may eventually need to 290 mcg dosing though we will see how you do.  Follow-up in 6 weeks.  It was very nice meeting you today.  Dr. Marletta Lor

## 2023-05-30 ENCOUNTER — Encounter: Payer: Self-pay | Admitting: Internal Medicine

## 2023-05-31 DIAGNOSIS — M25562 Pain in left knee: Secondary | ICD-10-CM | POA: Diagnosis not present

## 2023-06-02 DIAGNOSIS — M25562 Pain in left knee: Secondary | ICD-10-CM | POA: Diagnosis not present

## 2023-06-03 ENCOUNTER — Telehealth: Payer: Self-pay

## 2023-06-03 ENCOUNTER — Other Ambulatory Visit: Payer: Self-pay | Admitting: Family Medicine

## 2023-06-03 MED ORDER — NIRMATRELVIR/RITONAVIR (PAXLOVID)TABLET: ORAL_TABLET | ORAL | 0 refills | Status: DC

## 2023-06-03 NOTE — Telephone Encounter (Signed)
Comment: PT left a voicemail this morning stating that she tested positive for COVID at 6:30am this morning . PT is also complaining of a fever, diarrhea, and headache. Pt would like medication sent in. Please advise.

## 2023-06-03 NOTE — Telephone Encounter (Signed)
Pt tested positive with Covid and wants Paxlovid called in    [facility] in [place] -[phone]

## 2023-06-09 DIAGNOSIS — M25562 Pain in left knee: Secondary | ICD-10-CM | POA: Diagnosis not present

## 2023-06-10 ENCOUNTER — Ambulatory Visit: Payer: PPO | Admitting: Family Medicine

## 2023-06-14 DIAGNOSIS — M25562 Pain in left knee: Secondary | ICD-10-CM | POA: Diagnosis not present

## 2023-06-16 DIAGNOSIS — H35033 Hypertensive retinopathy, bilateral: Secondary | ICD-10-CM | POA: Diagnosis not present

## 2023-06-16 DIAGNOSIS — M25562 Pain in left knee: Secondary | ICD-10-CM | POA: Diagnosis not present

## 2023-06-16 DIAGNOSIS — H353113 Nonexudative age-related macular degeneration, right eye, advanced atrophic without subfoveal involvement: Secondary | ICD-10-CM | POA: Diagnosis not present

## 2023-06-16 DIAGNOSIS — H43813 Vitreous degeneration, bilateral: Secondary | ICD-10-CM | POA: Diagnosis not present

## 2023-06-16 DIAGNOSIS — H26491 Other secondary cataract, right eye: Secondary | ICD-10-CM | POA: Diagnosis not present

## 2023-06-16 DIAGNOSIS — H353133 Nonexudative age-related macular degeneration, bilateral, advanced atrophic without subfoveal involvement: Secondary | ICD-10-CM | POA: Diagnosis not present

## 2023-06-20 ENCOUNTER — Telehealth: Payer: Self-pay

## 2023-06-20 NOTE — Telephone Encounter (Signed)
Dr Marletta Lor, Returned the pt's call and was advised that she has had severe gas all weekend. She states the Gas-X has not worked. Pt has also been drinking hot 7up. I advised the pt not to do that. She asked was it her Linzess but she has been taking it since June. I advised the pt that the gas would have showed before now. Advised the pt we would call her back. Please advise

## 2023-06-21 DIAGNOSIS — M25562 Pain in left knee: Secondary | ICD-10-CM | POA: Diagnosis not present

## 2023-06-23 ENCOUNTER — Ambulatory Visit (HOSPITAL_COMMUNITY)
Admission: RE | Admit: 2023-06-23 | Discharge: 2023-06-23 | Disposition: A | Discharge status: Home / Self Care | Payer: PPO | Source: Ambulatory Visit | Attending: Family Medicine | Admitting: Family Medicine

## 2023-06-23 DIAGNOSIS — Z87891 Personal history of nicotine dependence: Secondary | ICD-10-CM | POA: Insufficient documentation

## 2023-06-23 DIAGNOSIS — Z122 Encounter for screening for malignant neoplasm of respiratory organs: Secondary | ICD-10-CM | POA: Diagnosis not present

## 2023-06-23 DIAGNOSIS — Z136 Encounter for screening for cardiovascular disorders: Secondary | ICD-10-CM | POA: Insufficient documentation

## 2023-06-23 DIAGNOSIS — F17210 Nicotine dependence, cigarettes, uncomplicated: Secondary | ICD-10-CM | POA: Diagnosis not present

## 2023-06-24 DIAGNOSIS — M25562 Pain in left knee: Secondary | ICD-10-CM | POA: Diagnosis not present

## 2023-06-28 ENCOUNTER — Encounter: Payer: Self-pay | Admitting: Urology

## 2023-06-28 ENCOUNTER — Ambulatory Visit: Payer: PPO | Admitting: Urology

## 2023-06-28 VITALS — BP 135/84 | HR 88

## 2023-06-28 DIAGNOSIS — N3281 Overactive bladder: Secondary | ICD-10-CM

## 2023-06-28 DIAGNOSIS — R3129 Other microscopic hematuria: Secondary | ICD-10-CM | POA: Diagnosis not present

## 2023-06-28 DIAGNOSIS — R3915 Urgency of urination: Secondary | ICD-10-CM

## 2023-06-28 NOTE — Progress Notes (Signed)
History of Present Illness: Mary Hart is a 82 y.o. year old female returns for f/u of OAB as well as microscopic hematuria.  She has used behavioral therapy for her OAB.  She is comfortable with the way her bladder works now.  She has had no gross hematuria.  Past Medical History:  Diagnosis Date   Anxiety    Arthritis    oa   Clostridium difficile infection 2013, 2015   Treated with vancomycin   COPD (chronic obstructive pulmonary disease) (HCC)    minimal shortness of breath   Depression    Diverticula of colon    Genital herpes    GERD (gastroesophageal reflux disease)    Hypothyroid    IBS (irritable bowel syndrome)    Internal hemorrhoids    Jaundice    as child cause unknown   Neuropathy    fingers both hands   PONV (postoperative nausea and vomiting)    did well with last neck fusion    Recurrent colitis due to Clostridium difficile 07/03/2013   4 treatments with vancomycin since late 2013/early 2014 - responds then relapses     Vocal cord dysfunction    SYASMONIC DYSPHONIA    Past Surgical History:  Procedure Laterality Date   ABDOMINAL HYSTERECTOMY     Anterior Cervical Fusion X 2     CHOLECYSTECTOMY     COLONOSCOPY  06/20/2008   [facility] Dr. Damita Dunnings external hemorrhoids   COLONOSCOPY  10/12/2012   Dr. Jena Gauss- internal hemorrhoids, very early, few, sigmoid diverticula o/w normal appearing colon and terminal ileum. + cdiff on stool samples taken. Segmental bx negative.   ESOPHAGOGASTRODUODENOSCOPY  11/25/10   Rourk- tubular esophagus s/p of 56 dilator from a small HH/otherwise normal   EYE SURGERY Bilateral 2012   ioc with lens replacement for cataracts   JOINT REPLACEMENT  09/2010   Thumb left   meniscus repair right knee  2014   NASAL SEPTOPLASTY W/ TURBINOPLASTY Bilateral 12/07/2021   Procedure: NASAL SEPTOPLASTY WITH TURBINATE REDUCTION;  Surgeon: Newman Pies, MD;  Location: [facility];  Service: ENT;  Laterality: Bilateral;   TOTAL KNEE  ARTHROPLASTY Right 12/27/2016   Procedure: RIGHT TOTAL KNEE ARTHROPLASTY;  Surgeon: Ollen Gross, MD;  Location: [facility];  Service: Orthopedics;  Laterality: Right;  Adductor Block   VESICOVAGINAL FISTULA CLOSURE W/ TAH      Home Medications:  (Not in a hospital admission)   Allergies:  Allergies  Allergen Reactions   Oxycodone Other (See Comments) and Itching   Codeine Nausea And Vomiting   Hydrocodone    Hydromorphone Hcl Itching   Levonorgestrel-Ethinyl Estrad Other (See Comments)   Morphine Nausea And Vomiting   Moxifloxacin Other (See Comments)    N/V/D, skin red. Tolerates Levaquin and Cipro.   Sulfonamide Derivatives Other (See Comments)    "almost died" "pulse went down to 12"    Family History  Problem Relation Age of Onset   Dementia Mother    Breast cancer Mother    Dementia Father    Esophageal cancer Brother    Colon cancer Maternal Uncle    Breast cancer Cousin    Allergic rhinitis Neg Hx    Asthma Neg Hx    Eczema Neg Hx    Urticaria Neg Hx    Prostate cancer Neg Hx    Pancreatic cancer Neg Hx    Ovarian cancer Neg Hx    Endometrial cancer Neg Hx     Social History:  reports that  she quit smoking about 7 years ago. Her smoking use included cigarettes. She started smoking about 57 years ago. She has a 33 pack-year smoking history. She has never used smokeless tobacco. She reports current alcohol use. She reports that she does not use drugs.  ROS: A complete review of systems was performed.  All systems are negative except for pertinent findings as noted.  Physical Exam:  Vital signs in last 24 hours: @VSRANGES @ General:  Alert and oriented, No acute distress HEENT: Normocephalic, atraumatic Neck: No JVD or lymphadenopathy Cardiovascular: Regular rate  Lungs: Normal inspiratory/expiratory excursion Extremities: No edema Neurologic: Grossly intact  I have reviewed prior pt notes  I have reviewed urinalysis results--clear today  I have  independently reviewed prior imaging--CT images reviewed with the patient, no renal abnormalities noted  Most recent blood chemistries reviewed    Impression/Assessment:  1.  OAB, currently satisfied with situation  2.  History of microscopic hematuria, cleared today.  CT scan from earlier this year was normal  Plan:  1.  Continue behavioral modification for OAB  2.  Urine sent for cytology  3.  Office visit as needed  Chelsea Aus 06/28/2023, 8:19 AM  Bertram Millard. Joseangel Nettleton MD

## 2023-06-29 LAB — URINALYSIS, ROUTINE W REFLEX MICROSCOPIC
Bilirubin, UA: NEGATIVE
Glucose, UA: NEGATIVE
Nitrite, UA: NEGATIVE
RBC, UA: NEGATIVE
Specific Gravity, UA: 1.025 (ref 1.005–1.030)
Urobilinogen, Ur: 0.2 mg/dL (ref 0.2–1.0)
pH, UA: 6 (ref 5.0–7.5)

## 2023-06-29 LAB — MICROSCOPIC EXAMINATION: Bacteria, UA: NONE SEEN

## 2023-06-30 ENCOUNTER — Ambulatory Visit (INDEPENDENT_AMBULATORY_CARE_PROVIDER_SITE_OTHER): Payer: PPO | Admitting: Family Medicine

## 2023-06-30 ENCOUNTER — Other Ambulatory Visit: Payer: Self-pay | Admitting: Family Medicine

## 2023-06-30 ENCOUNTER — Encounter: Payer: Self-pay | Admitting: Family Medicine

## 2023-06-30 VITALS — BP 126/70 | HR 77 | Temp 97.5°F | Wt 164.6 lb

## 2023-06-30 DIAGNOSIS — J449 Chronic obstructive pulmonary disease, unspecified: Secondary | ICD-10-CM | POA: Diagnosis not present

## 2023-06-30 DIAGNOSIS — K581 Irritable bowel syndrome with constipation: Secondary | ICD-10-CM | POA: Diagnosis not present

## 2023-06-30 DIAGNOSIS — F419 Anxiety disorder, unspecified: Secondary | ICD-10-CM | POA: Diagnosis not present

## 2023-06-30 LAB — CYTOLOGY, URINE

## 2023-06-30 MED ORDER — ALPRAZOLAM 0.25 MG PO TABS: ORAL_TABLET | ORAL | 0 refills | Status: DC

## 2023-06-30 NOTE — Patient Instructions (Signed)
Medication sent.  Continue Miralax.  Follow up in 6 months.

## 2023-07-01 DIAGNOSIS — F419 Anxiety disorder, unspecified: Secondary | ICD-10-CM | POA: Insufficient documentation

## 2023-07-01 NOTE — Assessment & Plan Note (Signed)
Alprazolam refilled.  We discussed use of this medication today.

## 2023-07-01 NOTE — Assessment & Plan Note (Signed)
Stable currently using MiraLAX.  Linzess has been discontinued.

## 2023-07-01 NOTE — Assessment & Plan Note (Signed)
Stable

## 2023-07-01 NOTE — Progress Notes (Signed)
Subjective:  Patient ID: Mary Hart, female    DOB: 04-13-1941  Age: 82 y.o. MRN: [id number]  CC: Follow up   HPI:  82 year old female with the below mentioned medical problems presents for follow-up.  COPD is stable.  She is currently using samples she was given of Breztri.  Also has Breo at home.  Patient states that she needs a refill on alprazolam.  She uses this for times where she has anxiety in the setting of flares of IBS.  She states that she uses this very sparingly.  Patient no longer on Linzess.  Has been using MiraLAX and has been going to the bathroom fairly regularly.  She states that the Linzess is just too strong and causes her abdominal discomfort.  Patient Active Problem List   Diagnosis Date Noted   Anxiety 07/01/2023   History of hysterectomy 04/07/2023   Diverticulosis 04/07/2023   COPD (chronic obstructive pulmonary disease) (HCC) 02/02/2023   Adnexal mass 02/02/2023   Hypothyroidism 02/02/2023   Aortic atherosclerosis (HCC) 02/01/2023   OA (osteoarthritis) of knee 12/27/2016   Irritable bowel syndrome 05/04/2012   GERD (gastroesophageal reflux disease) 02/14/2012    Social Hx   Social History   Socioeconomic History   Marital status: Widowed    Spouse name: Not on file   Number of children: 4   Years of education: Not on file   Highest education level: Not on file  Occupational History   Occupation: Retired    Associate Professor: RETIRED  Tobacco Use   Smoking status: Former    Current packs/day: 0.00    Average packs/day: 0.7 packs/day for 50.0 years (33.0 ttl pk-yrs)    Types: Cigarettes    Start date: 37    Quit date: 2017    Years since quitting: 7.6   Smokeless tobacco: Never   Tobacco comments:    History of 1/2 pack per day  Vaping Use   Vaping status: Former  Substance and Sexual Activity   Alcohol use: Yes    Comment: occasional glass of wine   Drug use: No   Sexual activity: Not Currently  Other Topics Concern   Not on  file  Social History Narrative   Married, 2 sons and 2 daughters   Husband chronically ill and she helps with care   She is retired   International aid/development worker of Corporate investment banker Strain: Low Risk  (04/22/2023)   Overall Financial Resource Strain (CARDIA)    Difficulty of Paying Living Expenses: Not hard at all  Food Insecurity: No Food Insecurity (04/22/2023)   Hunger Vital Sign    Worried About Running Out of Food in the Last Year: Never true    Ran Out of Food in the Last Year: Never true  Transportation Needs: No Transportation Needs (04/22/2023)   PRAPARE - Administrator, Civil Service (Medical): No    Lack of Transportation (Non-Medical): No  Physical Activity: Sufficiently Active (04/22/2023)   Exercise Vital Sign    Days of Exercise per Week: 4 days    Minutes of Exercise per Session: 60 min  Stress: No Stress Concern Present (04/22/2023)   [employer] of Occupational Health - Occupational Stress Questionnaire    Feeling of Stress : Not at all  Social Connections: Socially Integrated (04/22/2023)   Social Connection and Isolation Panel [NHANES]    Frequency of Communication with Friends and Family: More than three times a week    Frequency of Social Gatherings  with Friends and Family: More than three times a week    Attends Religious Services: More than 4 times per year    Active Member of Clubs or Organizations: Yes    Attends Engineer, structural: More than 4 times per year    Marital Status: Married    Review of Systems Per HPI  Objective:  BP 126/70   Pulse 77   Temp (!) 97.5 F (36.4 C) (Temporal)   Wt 164 lb 9.6 oz (74.7 kg)   SpO2 95%   BMI 31.10 kg/m      06/30/2023   10:59 AM 06/28/2023    3:15 PM 05/27/2023    9:53 AM  BP/Weight  Systolic BP 126 135 127  Diastolic BP 70 84 80  Wt. (Lbs) 164.6  164  BMI 31.1 kg/m2  30.99 kg/m2    Physical Exam Vitals and nursing note reviewed.  Constitutional:      Appearance: Normal  appearance.  HENT:     Head: Normocephalic and atraumatic.  Cardiovascular:     Rate and Rhythm: Normal rate and regular rhythm.  Pulmonary:     Effort: Pulmonary effort is normal.     Breath sounds: Normal breath sounds. No wheezing, rhonchi or rales.  Abdominal:     General: There is no distension.     Palpations: Abdomen is soft.     Tenderness: There is no abdominal tenderness.  Neurological:     Mental Status: She is alert.  Psychiatric:        Mood and Affect: Mood normal.        Behavior: Behavior normal.     Lab Results  Component Value Date   WBC 7.9 05/10/2023   HGB 13.9 05/10/2023   HCT 42.2 05/10/2023   PLT 303 05/10/2023   GLUCOSE 85 05/10/2023   CHOL 195 02/01/2023   TRIG 102 02/01/2023   HDL 98 02/01/2023   LDLCALC 79 02/01/2023   ALT 22 05/10/2023   AST 21 05/10/2023   NA 139 05/10/2023   K 5.0 05/10/2023   CL 100 05/10/2023   CREATININE 0.83 05/10/2023   BUN 9 05/10/2023   CO2 25 05/10/2023   TSH 0.920 02/01/2023   INR 0.93 12/21/2016     Assessment & Plan:   Problem List Items Addressed This Visit       Respiratory   COPD (chronic obstructive pulmonary disease) (HCC)    Stable.         Digestive   Irritable bowel syndrome - Primary    Stable currently using MiraLAX.  Linzess has been discontinued.        Other   Anxiety    Alprazolam refilled.  We discussed use of this medication today.       Meds ordered this encounter  Medications   DISCONTD: ALPRAZolam (XANAX) 0.25 MG tablet    Sig: Take 1 tablet by mouth as needed for anxiety.    Dispense:  20 tablet    Refill:  0    anxiety    Follow-up:  Return in about 6 months (around 12/28/2023).  Everlene Other DO [facility]

## 2023-07-04 ENCOUNTER — Other Ambulatory Visit: Payer: Self-pay

## 2023-07-04 DIAGNOSIS — J449 Chronic obstructive pulmonary disease, unspecified: Secondary | ICD-10-CM

## 2023-07-05 ENCOUNTER — Telehealth: Payer: Self-pay

## 2023-07-05 NOTE — Telephone Encounter (Signed)
Received a staff message from Dr. Patsey Berthold office requesting we review the 05/2023 LDCT that he had ordered.  LDCT RADS0 and recommended repeat in 3 months.  Results reviewed by Kandice Robinsons, NP and she agrees with recommendation to repeat in 3 months.  Dr. Adriana Simas had ordered CT chest w/contrast and it has been scheduled for Sept 2024.  Spoke with patient by phone to advise her that our office was asked to follow up on the next CT chest to review and advise for results.  Patient states she was confused that the CT was scheduled for late Sept when she was told it would be in 3 months.  She had covid 'right before' she had the LDCT in August.  She still has shortness of breath but has not been taking her Virgel Bouquet due to cost.  She denies chest congestion or any other symptoms of illness. She believes not taking the [facility] as prescribed has made her more short of breath.  She sees Dr. Wynona Neat next week for COPD follow up.  She will discuss her medications at that time.  Advised patient we have messaged Dr. Patsey Berthold office about our recommendation for CT follow up and we will contact her once we hear back from them. If he agrees with plan, we will plan to rescan her chest in Nov and make follow up appointment with pulmonary for review of results. Patient agrees and will be contacted for scheduling new appointments.

## 2023-07-06 ENCOUNTER — Other Ambulatory Visit: Payer: Self-pay

## 2023-07-06 DIAGNOSIS — Z13220 Encounter for screening for lipoid disorders: Secondary | ICD-10-CM

## 2023-07-06 DIAGNOSIS — Z87891 Personal history of nicotine dependence: Secondary | ICD-10-CM

## 2023-07-06 DIAGNOSIS — R918 Other nonspecific abnormal finding of lung field: Secondary | ICD-10-CM

## 2023-07-07 ENCOUNTER — Telehealth: Payer: Self-pay

## 2023-07-07 ENCOUNTER — Ambulatory Visit (HOSPITAL_COMMUNITY)
Admission: RE | Admit: 2023-07-07 | Discharge: 2023-07-07 | Disposition: A | Discharge status: Home / Self Care | Payer: PPO | Source: Ambulatory Visit | Attending: Gynecologic Oncology | Admitting: Gynecologic Oncology

## 2023-07-07 DIAGNOSIS — N949 Unspecified condition associated with female genital organs and menstrual cycle: Secondary | ICD-10-CM | POA: Insufficient documentation

## 2023-07-07 DIAGNOSIS — N838 Other noninflammatory disorders of ovary, fallopian tube and broad ligament: Secondary | ICD-10-CM | POA: Diagnosis not present

## 2023-07-07 DIAGNOSIS — K5730 Diverticulosis of large intestine without perforation or abscess without bleeding: Secondary | ICD-10-CM | POA: Diagnosis not present

## 2023-07-07 MED ORDER — GADOBUTROL 1 MMOL/ML IV SOLN
7.0000 mL | Freq: Once | INTRAVENOUS | Status: AC | PRN
  Administered 2023-07-07: 7 mL via INTRAVENOUS

## 2023-07-07 NOTE — Telephone Encounter (Signed)
Patient is made aware and voiced understanding. "Please let her kow that cytology test was normal--no cancer cells"

## 2023-07-11 ENCOUNTER — Encounter: Payer: Self-pay | Admitting: Pulmonary Disease

## 2023-07-11 ENCOUNTER — Ambulatory Visit: Payer: PPO | Admitting: Pulmonary Disease

## 2023-07-11 VITALS — BP 120/70 | HR 77 | Ht 62.0 in | Wt 167.2 lb

## 2023-07-11 DIAGNOSIS — R0602 Shortness of breath: Secondary | ICD-10-CM | POA: Diagnosis not present

## 2023-07-11 NOTE — Progress Notes (Signed)
Mary Hart    [id number]    02/02/1941  Primary Care Physician:Cook, Verdis Frederickson, DO  Referring Physician: Tommie Sams, DO [address] [place],  [place] [address]  Chief complaint:   Patient being seen for protracted shortness of breath  HPI:  Has not COPD for many years but in the last few months has felt really bad She had gone to see a show and since then has been more short of breath despite escalation of treatment  Continues to use Breo regularly  Did not tolerate Breztri  Recently had a CT scan of the chest showing haziness at the bottom part of the lung, she did have COVID just a week before the CAT scan was performed  Breathing continues to feel better  She does get short of breath with activity Did not tolerate Trelegy in the past as well given her bad taste in her mouth  Denies any chest pains or chest discomfort  She tries to stay active  CT scan was reviewed with during the office visit today  Past history of smoking  Outpatient Encounter Medications as of 07/11/2023  Medication Sig   albuterol (VENTOLIN HFA) 108 (90 Base) MCG/ACT inhaler Inhale 1-2 puffs into the lungs every 6 (six) hours as needed for wheezing or shortness of breath.   ALPRAZolam (XANAX) 0.25 MG tablet Take 1 tablet by mouth daily as needed for anxiety.   Aspirin-Salicylamide-Caffeine (ARTHRITIS STRENGTH BC POWDER PO) Take by mouth. One daily   BREO ELLIPTA 200-25 MCG/ACT AEPB Inhale 1 puff by mouth once daily   cetirizine (ZYRTEC) 10 MG tablet Take 10 mg by mouth at bedtime.   cholecalciferol (VITAMIN D3) 25 MCG (1000 UNIT) tablet Take 1,000 Units by mouth daily.   fluticasone (FLONASE) 50 MCG/ACT nasal spray Place into both nostrils daily.   levothyroxine (SYNTHROID) 75 MCG tablet Take 1 tablet (75 mcg total) by mouth daily before breakfast.   No facility-administered encounter medications on file as of 07/11/2023.    Allergies as of 07/11/2023 - Review Complete  07/11/2023  Allergen Reaction Noted   Oxycodone Other (See Comments) and Itching 11/05/2022   Codeine Nausea And Vomiting    Hydrocodone  08/19/2019   Hydromorphone hcl Itching    Levonorgestrel-ethinyl estrad Other (See Comments) 11/05/2022   Morphine Nausea And Vomiting    Moxifloxacin Other (See Comments) 11/05/2022   Sulfonamide derivatives Other (See Comments)     Past Medical History:  Diagnosis Date   Anxiety    Arthritis    oa   Clostridium difficile infection 2013, 2015   Treated with vancomycin   COPD (chronic obstructive pulmonary disease) (HCC)    minimal shortness of breath   Depression    Diverticula of colon    Genital herpes    GERD (gastroesophageal reflux disease)    History of total knee replacement, right 03/28/2019   Hypothyroid    IBS (irritable bowel syndrome)    Internal hemorrhoids    Jaundice    as child cause unknown   Neuropathy    fingers both hands   PONV (postoperative nausea and vomiting)    did well with last neck fusion    Recurrent colitis due to Clostridium difficile 07/03/2013   4 treatments with vancomycin since late 2013/early 2014 - responds then relapses     Vocal cord dysfunction    SYASMONIC DYSPHONIA    Past Surgical History:  Procedure Laterality Date  ABDOMINAL HYSTERECTOMY     Anterior Cervical Fusion X 2     CHOLECYSTECTOMY     COLONOSCOPY  06/20/2008   [facility] Dr. Damita Dunnings external hemorrhoids   COLONOSCOPY  10/12/2012   Dr. Jena Gauss- internal hemorrhoids, very early, few, sigmoid diverticula o/w normal appearing colon and terminal ileum. + cdiff on stool samples taken. Segmental bx negative.   ESOPHAGOGASTRODUODENOSCOPY  11/25/10   Rourk- tubular esophagus s/p of 56 dilator from a small HH/otherwise normal   EYE SURGERY Bilateral 2012   ioc with lens replacement for cataracts   JOINT REPLACEMENT  09/2010   Thumb left   meniscus repair right knee  2014   NASAL SEPTOPLASTY W/ TURBINOPLASTY Bilateral 12/07/2021    Procedure: NASAL SEPTOPLASTY WITH TURBINATE REDUCTION;  Surgeon: Newman Pies, MD;  Location: [facility];  Service: ENT;  Laterality: Bilateral;   TOTAL KNEE ARTHROPLASTY Right 12/27/2016   Procedure: RIGHT TOTAL KNEE ARTHROPLASTY;  Surgeon: Ollen Gross, MD;  Location: [facility];  Service: Orthopedics;  Laterality: Right;  Adductor Block   VESICOVAGINAL FISTULA CLOSURE W/ TAH      Family History  Problem Relation Age of Onset   Dementia Mother    Breast cancer Mother    Dementia Father    Esophageal cancer Brother    Colon cancer Maternal Uncle    Breast cancer Cousin    Allergic rhinitis Neg Hx    Asthma Neg Hx    Eczema Neg Hx    Urticaria Neg Hx    Prostate cancer Neg Hx    Pancreatic cancer Neg Hx    Ovarian cancer Neg Hx    Endometrial cancer Neg Hx     Social History   Socioeconomic History   Marital status: Widowed    Spouse name: Not on file   Number of children: 4   Years of education: Not on file   Highest education level: Not on file  Occupational History   Occupation: Retired    Associate Professor: RETIRED  Tobacco Use   Smoking status: Former    Current packs/day: 0.00    Average packs/day: 0.7 packs/day for 50.0 years (33.0 ttl pk-yrs)    Types: Cigarettes    Start date: 31    Quit date: 2017    Years since quitting: 7.7   Smokeless tobacco: Never   Tobacco comments:    History of 1/2 pack per day  Vaping Use   Vaping status: Former  Substance and Sexual Activity   Alcohol use: Yes    Comment: occasional glass of wine   Drug use: No   Sexual activity: Not Currently  Other Topics Concern   Not on file  Social History Narrative   Married, 2 sons and 2 daughters   Husband chronically ill and she helps with care   She is retired   International aid/development worker of Corporate investment banker Strain: Low Risk  (04/22/2023)   Overall Financial Resource Strain (CARDIA)    Difficulty of Paying Living Expenses: Not hard at all  Food Insecurity: No Food  Insecurity (04/22/2023)   Hunger Vital Sign    Worried About Running Out of Food in the Last Year: Never true    Ran Out of Food in the Last Year: Never true  Transportation Needs: No Transportation Needs (04/22/2023)   PRAPARE - Administrator, Civil Service (Medical): No    Lack of Transportation (Non-Medical): No  Physical Activity: Sufficiently Active (04/22/2023)   Exercise Vital Sign  Days of Exercise per Week: 4 days    Minutes of Exercise per Session: 60 min  Stress: No Stress Concern Present (04/22/2023)   [employer] of Occupational Health - Occupational Stress Questionnaire    Feeling of Stress : Not at all  Social Connections: Socially Integrated (04/22/2023)   Social Connection and Isolation Panel [NHANES]    Frequency of Communication with Friends and Family: More than three times a week    Frequency of Social Gatherings with Friends and Family: More than three times a week    Attends Religious Services: More than 4 times per year    Active Member of [employer] or Organizations: Yes    Attends Engineer, structural: More than 4 times per year    Marital Status: Married  Catering manager Violence: Not At Risk (04/22/2023)   Humiliation, Afraid, Rape, and Kick questionnaire    Fear of Current or Ex-Partner: No    Emotionally Abused: No    Physically Abused: No    Sexually Abused: No    Review of Systems  Respiratory:  Positive for cough and shortness of breath.     Vitals:   07/11/23 1349  BP: 120/70  Pulse: 77  SpO2: 99%     Physical Exam Constitutional:      Appearance: Normal appearance.  HENT:     Head: Normocephalic.     Mouth/Throat:     Mouth: Mucous membranes are moist.  Eyes:     General: No scleral icterus. Cardiovascular:     Rate and Rhythm: Normal rate and regular rhythm.     Heart sounds: No murmur heard.    No friction rub.  Pulmonary:     Effort: No respiratory distress.     Breath sounds: No stridor. No wheezing or  rhonchi.  Musculoskeletal:     Cervical back: No rigidity or tenderness.  Neurological:     Mental Status: She is alert.  Psychiatric:        Mood and Affect: Mood normal.    Data Reviewed: Chest x-ray 10/28/2022 reviewed with the patient showing bibasal haziness  Most recent CT was reviewed with the patient showing groundglass changes bases of the lungs  PFT was reviewed with the patient today showing no obstruction, no significant bronchodilator response, no restriction, normal diffusing capacity  Assessment:  History of chronic obstructive pulmonary disease with stable symptoms Till she had COVID infection Shortness of breath since then, appears to be stable  She does have some shortness of breath on exertion  Tolerating Breo well -Needs some samples of Breo -Donut hole  Plan/Recommendations: Continue Breo  Albuterol as needed  She has a repeat CT ordered for December, will follow-up with Kandice Robinsons after that . I will see her back in about a year  Plans may change following follow-up in December following a CT  Virl Diamond MD [facility] 07/11/2023, 1:57 PM  CC: Tommie Sams, DO

## 2023-07-11 NOTE — Patient Instructions (Signed)
Follow-up with Mary Hart in December after your CAT scan  Tentative follow-up with me in about a year  Call us with significant concerns  As long as your symptoms continue to improve, I do not think there is anything to worry about  The fluffiness in the lungs on the CAT scan is likely related to you just having COVID prior to the study  We will provide you with samples of Breo  Call us with significant concerns

## 2023-07-12 ENCOUNTER — Ambulatory Visit: Payer: PPO | Admitting: Gastroenterology

## 2023-07-12 ENCOUNTER — Encounter: Payer: Self-pay | Admitting: Gastroenterology

## 2023-07-12 VITALS — BP 130/83 | HR 65 | Temp 98.5°F | Ht 62.0 in | Wt 167.0 lb

## 2023-07-12 DIAGNOSIS — K5900 Constipation, unspecified: Secondary | ICD-10-CM | POA: Diagnosis not present

## 2023-07-12 MED ORDER — LUBIPROSTONE 24 MCG PO CAPS: 24.0000 ug | ORAL_CAPSULE | Freq: Two times a day (BID) | ORAL | 3 refills | Status: DC

## 2023-07-12 NOTE — Progress Notes (Signed)
Gastroenterology Office Note     Primary Care Physician:  Tommie Sams, DO  Primary Gastroenterologist: Dr. Marletta Lor    Chief Complaint   Chief Complaint  Patient presents with   Follow-up    Pt following up on IBS-C     History of Present Illness   Mary Hart is a delightful 82 y.o. female presenting today with a history of chronic constipation, pelvic floor dysfunction s/p PT without improvement, GERD, here for follow-up after recent visit in Aug 2024.   At August visit, she was to do a Miralax purge and then start Linzess 145 mcg daily. She notes Linzess caused a lot of gas and the abdominal pain .  looser stools. Incomplete evacuation. Feels like something in rectum may be blocking things.   Upon review, she does not believe she has taken Amitiza in the past although there was some question she had tried this. She has not tried Trulance but she worries this is not covered thru insurance. Has tried Dulcolax, MiraLAX over-the-counter without relief. Miralax caused very mushy stool if taking daily. Not taken benefiber.   She had LLQ pain a few days ago but improved after another mini purge. She feels this is affecting her quality of life.    Colonoscopy June 2019 with sigmoid diverticulosis as well as slight narrowing associated with diverticulosis.  No polyps.    Past Medical History:  Diagnosis Date   Anxiety    Arthritis    oa   Clostridium difficile infection 2013, 2015   Treated with vancomycin   COPD (chronic obstructive pulmonary disease) (HCC)    minimal shortness of breath   Depression    Diverticula of colon    Genital herpes    GERD (gastroesophageal reflux disease)    History of total knee replacement, right 03/28/2019   Hypothyroid    IBS (irritable bowel syndrome)    Internal hemorrhoids    Jaundice    as child cause unknown   Neuropathy    fingers both hands   PONV (postoperative nausea and vomiting)    did well with last neck fusion     Recurrent colitis due to Clostridium difficile 07/03/2013   4 treatments with vancomycin since late 2013/early 2014 - responds then relapses     Vocal cord dysfunction    SYASMONIC DYSPHONIA    Past Surgical History:  Procedure Laterality Date   ABDOMINAL HYSTERECTOMY     Anterior Cervical Fusion X 2     CHOLECYSTECTOMY     COLONOSCOPY  06/20/2008   [facility] Dr. Damita Dunnings external hemorrhoids   COLONOSCOPY  10/12/2012   Dr. Jena Gauss- internal hemorrhoids, very early, few, sigmoid diverticula o/w normal appearing colon and terminal ileum. + cdiff on stool samples taken. Segmental bx negative.   ESOPHAGOGASTRODUODENOSCOPY  11/25/10   Rourk- tubular esophagus s/p of 56 dilator from a small HH/otherwise normal   EYE SURGERY Bilateral 2012   ioc with lens replacement for cataracts   JOINT REPLACEMENT  09/2010   Thumb left   meniscus repair right knee  2014   NASAL SEPTOPLASTY W/ TURBINOPLASTY Bilateral 12/07/2021   Procedure: NASAL SEPTOPLASTY WITH TURBINATE REDUCTION;  Surgeon: Newman Pies, MD;  [facility];  Service: ENT;  Laterality: Bilateral;   TOTAL KNEE ARTHROPLASTY Right 12/27/2016   Procedure: RIGHT TOTAL KNEE ARTHROPLASTY;  Surgeon: Ollen Gross, MD;  Location: [facility];  Service: Orthopedics;  Laterality: Right;  Adductor Block   VESICOVAGINAL FISTULA CLOSURE W/ TAH  Current Outpatient Medications  Medication Sig Dispense Refill   albuterol (VENTOLIN HFA) 108 (90 Base) MCG/ACT inhaler Inhale 1-2 puffs into the lungs every 6 (six) hours as needed for wheezing or shortness of breath.     ALPRAZolam (XANAX) 0.25 MG tablet Take 1 tablet by mouth daily as needed for anxiety. 20 tablet 0   Aspirin-Salicylamide-Caffeine (ARTHRITIS STRENGTH BC POWDER PO) Take by mouth. One daily     BREO ELLIPTA 200-25 MCG/ACT AEPB Inhale 1 puff by mouth once daily 180 each 0   cetirizine (ZYRTEC) 10 MG tablet Take 10 mg by mouth at bedtime.     cholecalciferol (VITAMIN D3) 25 MCG (1000  UNIT) tablet Take 1,000 Units by mouth daily.     fluticasone (FLONASE) 50 MCG/ACT nasal spray Place into both nostrils daily.     levothyroxine (SYNTHROID) 75 MCG tablet Take 1 tablet (75 mcg total) by mouth daily before breakfast. 90 tablet 1   No current facility-administered medications for this visit.    Allergies as of 07/12/2023 - Review Complete 07/12/2023  Allergen Reaction Noted   Oxycodone Other (See Comments) and Itching 11/05/2022   Codeine Nausea And Vomiting    Hydrocodone  08/19/2019   Hydromorphone hcl Itching    Levonorgestrel-ethinyl estrad Other (See Comments) 11/05/2022   Morphine Nausea And Vomiting    Moxifloxacin Other (See Comments) 11/05/2022   Sulfonamide derivatives Other (See Comments)     Family History  Problem Relation Age of Onset   Dementia Mother    Breast cancer Mother    Dementia Father    Esophageal cancer Brother    Colon cancer Maternal Uncle    Breast cancer Cousin    Allergic rhinitis Neg Hx    Asthma Neg Hx    Eczema Neg Hx    Urticaria Neg Hx    Prostate cancer Neg Hx    Pancreatic cancer Neg Hx    Ovarian cancer Neg Hx    Endometrial cancer Neg Hx     Social History   Socioeconomic History   Marital status: Widowed    Spouse name: Not on file   Number of children: 4   Years of education: Not on file   Highest education level: Not on file  Occupational History   Occupation: Retired    Associate Professor: RETIRED  Tobacco Use   Smoking status: Former    Current packs/day: 0.00    Average packs/day: 0.7 packs/day for 50.0 years (33.0 ttl pk-yrs)    Types: Cigarettes    Start date: 73    Quit date: 2017    Years since quitting: 7.7   Smokeless tobacco: Never   Tobacco comments:    History of 1/2 pack per day  Vaping Use   Vaping status: Former  Substance and Sexual Activity   Alcohol use: Yes    Comment: occasional glass of wine   Drug use: No   Sexual activity: Not Currently  Other Topics Concern   Not on file   Social History Narrative   Married, 2 sons and 2 daughters   Husband chronically ill and she helps with care   She is retired   International aid/development worker of Corporate investment banker Strain: Low Risk  (04/22/2023)   Overall Financial Resource Strain (CARDIA)    Difficulty of Paying Living Expenses: Not hard at all  Food Insecurity: No Food Insecurity (04/22/2023)   Hunger Vital Sign    Worried About Running Out of Food in the Last Year: Never  true    Ran Out of Food in the Last Year: Never true  Transportation Needs: No Transportation Needs (04/22/2023)   PRAPARE - Administrator, Civil Service (Medical): No    Lack of Transportation (Non-Medical): No  Physical Activity: Sufficiently Active (04/22/2023)   Exercise Vital Sign    Days of Exercise per Week: 4 days    Minutes of Exercise per Session: 60 min  Stress: No Stress Concern Present (04/22/2023)   [employer] of Occupational Health - Occupational Stress Questionnaire    Feeling of Stress : Not at all  Social Connections: Socially Integrated (04/22/2023)   Social Connection and Isolation Panel [NHANES]    Frequency of Communication with Friends and Family: More than three times a week    Frequency of Social Gatherings with Friends and Family: More than three times a week    Attends Religious Services: More than 4 times per year    Active Member of [employer] or Organizations: Yes    Attends Engineer, structural: More than 4 times per year    Marital Status: Married  Catering manager Violence: Not At Risk (04/22/2023)   Humiliation, Afraid, Rape, and Kick questionnaire    Fear of Current or Ex-Partner: No    Emotionally Abused: No    Physically Abused: No    Sexually Abused: No     Review of Systems   Gen: Denies any fever, chills, fatigue, weight loss, lack of appetite.  CV: Denies chest pain, heart palpitations, peripheral edema, syncope.  Resp: Denies shortness of breath at rest or with exertion. Denies  wheezing or cough.  GI: Denies dysphagia or odynophagia. Denies jaundice, hematemesis, fecal incontinence. GU : Denies urinary burning, urinary frequency, urinary hesitancy MS: Denies joint pain, muscle weakness, cramps, or limitation of movement.  Derm: Denies rash, itching, dry skin Psych: Denies depression, anxiety, memory loss, and confusion Heme: Denies bruising, bleeding, and enlarged lymph nodes.   Physical Exam   BP 130/83   Pulse 65   Temp 98.5 F (36.9 C)   Ht 5\' 2"  (1.575 m)   Wt 167 lb (75.8 kg)   BMI 30.54 kg/m  General:   Alert and oriented. Pleasant and cooperative. Well-nourished and well-developed.  Head:  Normocephalic and atraumatic. Eyes:  Without icterus Abdomen:  +BS, soft, non-tender and non-distended. No HSM noted. No guarding or rebound. No masses appreciated.  Rectal:  Deferred  Msk:  Symmetrical without gross deformities. Normal posture. Extremities:  Without edema. Neurologic:  Alert and  oriented x4;  grossly normal neurologically. Skin:  Intact without significant lesions or rashes. Psych:  Alert and cooperative. Normal mood and affect.   Assessment/PLAN   KASHISH AULD is a delightful 82 y.o. female presenting today with a history of chronic constipation, pelvic floor dysfunction s/p PT without improvement, GERD, here for follow-up after recent visit in Aug 2024.   Constipation remains an issue in setting of pelvic floor dysfunction, with Linzess causing incomplete evacuation and worsening bloating/abdominal discomfort. We will trial Amitiza 24 mcg po BID with food, along with supplemental fiber to assist with formation of stools and passage.   If this is too expensive, we will continue with Benefiber and have her titrate Miralax daily as needed. I suspect addition of fiber will be helpful in this scenario.   Return in 6-8 weeks. Message on MyChart with any concerns or updates in interim.    Gelene Mink, PhD, ANP-BC [facility]  Gastroenterology

## 2023-07-12 NOTE — Patient Instructions (Signed)
I have sent in Amitiza gelcaps to take twice a day WITH FOOD to avoid nausea ( do not take on an empty stomach). If this is too pricey or not covered, please don't pick it up! Message me with what happens.  I would like for you to take Benefiber or similar 2 teaspoons a day each morning. If the Amitiza is not covered, we will have you take 1/2 to 1 capful of Miralax each morning with the fiber.  I will see you in 6-8 weeks, and please message on MyChart with any concerns and updates in the meantime!  I enjoyed seeing you again today! I value our relationship and want to provide genuine, compassionate, and quality care. You may receive a survey regarding your visit with me, and I welcome your feedback! Thanks so much for taking the time to complete this. I look forward to seeing you again.      Gelene Mink, PhD, ANP-BC [facility]enterology

## 2023-07-13 ENCOUNTER — Encounter: Payer: Self-pay | Admitting: Gynecologic Oncology

## 2023-07-13 ENCOUNTER — Inpatient Hospital Stay: Payer: PPO | Attending: Gynecologic Oncology | Admitting: Gynecologic Oncology

## 2023-07-13 DIAGNOSIS — N949 Unspecified condition associated with female genital organs and menstrual cycle: Secondary | ICD-10-CM

## 2023-07-13 DIAGNOSIS — K5790 Diverticulosis of intestine, part unspecified, without perforation or abscess without bleeding: Secondary | ICD-10-CM | POA: Diagnosis not present

## 2023-07-13 DIAGNOSIS — D398 Neoplasm of uncertain behavior of other specified female genital organs: Secondary | ICD-10-CM | POA: Diagnosis not present

## 2023-07-13 DIAGNOSIS — Z90710 Acquired absence of both cervix and uterus: Secondary | ICD-10-CM

## 2023-07-13 NOTE — Progress Notes (Signed)
Gynecologic Oncology Telehealth Note: Gyn-Onc  I connected with [name] on 07/13/23 at  4:20 PM EDT by telephone and verified that I am speaking with the correct person using two identifiers.  I discussed the limitations, risks, security and privacy concerns of performing an evaluation and management service by telemedicine and the availability of in-person appointments. I also discussed with the patient that there may be a patient responsible charge related to this service. The patient expressed understanding and agreed to proceed.  Other persons participating in the visit and their role in the encounter: none.  Patient's location: home Provider's location: [facility]  Reason for Visit: follow-up  Treatment History: Patient has a history of a right adnexal mass.  Pelvic ultrasound exam in 2010 revealed stable, slightly complex cyst in the right adnexa, stable in size from CT imaging in 2010 and 2005.  This was thought to likely represent a postoperative seroma or liquefied hematoma versus paraovarian cyst.  Location at this time was near the right vaginal cuff.   Pelvic ultrasound exam in 2011 showed a septated cystic lesion measuring 3.3 x 1.9 x 2.8 cm consisting of 2 cystic components with an intervening thick but smooth septation.  Low-level echoes are seen throughout the cystic components.  No definitive mural nodularity or solid masses.  Lesion noted to be stable in size and morphology since prior exam.  Left ovary not visualized.   CT of the abdomen and pelvis performed on 10/28/22 in the setting of left lower quadrant pain, dark stool, and nausea revealed interval increase in size of a 6.3 x 5 cm fluid density lesion within the right adnexal region.  Follow-up ultrasound recommended for evaluation.  Colonic diverticulosis without diverticulitis noted. Follow up pelvic ultrasound on 10/29/2022 revealed a 6.1 x 4.4 x 4.9 cm anechoic lesion with increased transmission and scattered internal  echoes within the right adnexa compatible with at least a mildly complex cyst.   Pelvic ultrasound exam at [facility] on 02/11/2023 shows a 2.8 x 1.6 x 2.2 cm complex cystic mass within the right adnexa without blood flow.  Left ovary not visualized.  No free fluid.   On 5/8, tumor markers were obtained. CA-125 was normal (29.3), HE4 was elevated (127). CEA was 4.2 and CA 19-9 26.    More recently, the patient underwent CT of the abdomen and pelvis on 03/22/2023 in the setting of lower abdominal pain with known right adnexal mass.  A smoothly marginated fluid density within the right adnexa now measures 3.2 x 2.6 cm, significantly decreased from prior CT imaging in January.  No internal septations or mural nodules noted.  No ascites.  Interval History: Doing well. Had COVID recently. Denies any abdominal pain, change to bowel function.  Past Medical/Surgical History: Past Medical History:  Diagnosis Date   Anxiety    Arthritis    oa   Clostridium difficile infection 2013, 2015   Treated with vancomycin   COPD (chronic obstructive pulmonary disease) (HCC)    minimal shortness of breath   Depression    Diverticula of colon    Genital herpes    GERD (gastroesophageal reflux disease)    History of total knee replacement, right 03/28/2019   Hypothyroid    IBS (irritable bowel syndrome)    Internal hemorrhoids    Jaundice    as child cause unknown   Neuropathy    fingers both hands   PONV (postoperative nausea and vomiting)    did well with last neck  fusion    Recurrent colitis due to Clostridium difficile 07/03/2013   4 treatments with vancomycin since late 2013/early 2014 - responds then relapses     Vocal cord dysfunction    SYASMONIC DYSPHONIA    Past Surgical History:  Procedure Laterality Date   ABDOMINAL HYSTERECTOMY     Anterior Cervical Fusion X 2     CHOLECYSTECTOMY     COLONOSCOPY  06/20/2008   [facility] Dr. Damita Dunnings external hemorrhoids   COLONOSCOPY   10/12/2012   Dr. Jena Gauss- internal hemorrhoids, very early, few, sigmoid diverticula o/w normal appearing colon and terminal ileum. + cdiff on stool samples taken. Segmental bx negative.   ESOPHAGOGASTRODUODENOSCOPY  11/25/10   Rourk- tubular esophagus s/p of 56 dilator from a small HH/otherwise normal   EYE SURGERY Bilateral 2012   ioc with lens replacement for cataracts   JOINT REPLACEMENT  09/2010   Thumb left   meniscus repair right knee  2014   NASAL SEPTOPLASTY W/ TURBINOPLASTY Bilateral 12/07/2021   Procedure: NASAL SEPTOPLASTY WITH TURBINATE REDUCTION;  Surgeon: Newman Pies, MD;  Location: [facility];  Service: ENT;  Laterality: Bilateral;   TOTAL KNEE ARTHROPLASTY Right 12/27/2016   Procedure: RIGHT TOTAL KNEE ARTHROPLASTY;  Surgeon: Ollen Gross, MD;  Location: [facility];  Service: Orthopedics;  Laterality: Right;  Adductor Block   VESICOVAGINAL FISTULA CLOSURE W/ TAH      Family History  Problem Relation Age of Onset   Dementia Mother    Breast cancer Mother    Dementia Father    Esophageal cancer Brother    Colon cancer Maternal Uncle    Breast cancer Cousin    Allergic rhinitis Neg Hx    Asthma Neg Hx    Eczema Neg Hx    Urticaria Neg Hx    Prostate cancer Neg Hx    Pancreatic cancer Neg Hx    Ovarian cancer Neg Hx    Endometrial cancer Neg Hx     Social History   Socioeconomic History   Marital status: Widowed    Spouse name: Not on file   Number of children: 4   Years of education: Not on file   Highest education level: Not on file  Occupational History   Occupation: Retired    Associate Professor: RETIRED  Tobacco Use   Smoking status: Former    Current packs/day: 0.00    Average packs/day: 0.7 packs/day for 50.0 years (33.0 ttl pk-yrs)    Types: Cigarettes    Start date: 37    Quit date: 2017    Years since quitting: 7.7   Smokeless tobacco: Never   Tobacco comments:    History of 1/2 pack per day  Vaping Use   Vaping status: Former  Substance and  Sexual Activity   Alcohol use: Yes    Comment: occasional glass of wine   Drug use: No   Sexual activity: Not Currently  Other Topics Concern   Not on file  Social History Narrative   Married, 2 sons and 2 daughters   Husband chronically ill and she helps with care   She is retired   International aid/development worker of Corporate investment banker Strain: Low Risk  (04/22/2023)   Overall Financial Resource Strain (CARDIA)    Difficulty of Paying Living Expenses: Not hard at all  Food Insecurity: No Food Insecurity (04/22/2023)   Hunger Vital Sign    Worried About Running Out of Food in the Last Year: Never true    Ran  Out of Food in the Last Year: Never true  Transportation Needs: No Transportation Needs (04/22/2023)   PRAPARE - Administrator, Civil Service (Medical): No    Lack of Transportation (Non-Medical): No  Physical Activity: Sufficiently Active (04/22/2023)   Exercise Vital Sign    Days of Exercise per Week: 4 days    Minutes of Exercise per Session: 60 min  Stress: No Stress Concern Present (04/22/2023)   [employer] of Occupational Health - Occupational Stress Questionnaire    Feeling of Stress : Not at all  Social Connections: Socially Integrated (04/22/2023)   Social Connection and Isolation Panel [NHANES]    Frequency of Communication with Friends and Family: More than three times a week    Frequency of Social Gatherings with Friends and Family: More than three times a week    Attends Religious Services: More than 4 times per year    Active Member of [employer] or Organizations: Yes    Attends Engineer, structural: More than 4 times per year    Marital Status: Married    Current Medications:  Current Outpatient Medications:    albuterol (VENTOLIN HFA) 108 (90 Base) MCG/ACT inhaler, Inhale 1-2 puffs into the lungs every 6 (six) hours as needed for wheezing or shortness of breath., Disp: , Rfl:    ALPRAZolam (XANAX) 0.25 MG tablet, Take 1 tablet by mouth  daily as needed for anxiety., Disp: 20 tablet, Rfl: 0   Aspirin-Salicylamide-Caffeine (ARTHRITIS STRENGTH BC POWDER PO), Take by mouth. One daily, Disp: , Rfl:    BREO ELLIPTA 200-25 MCG/ACT AEPB, Inhale 1 puff by mouth once daily, Disp: 180 each, Rfl: 0   cetirizine (ZYRTEC) 10 MG tablet, Take 10 mg by mouth at bedtime., Disp: , Rfl:    cholecalciferol (VITAMIN D3) 25 MCG (1000 UNIT) tablet, Take 1,000 Units by mouth daily., Disp: , Rfl:    fluticasone (FLONASE) 50 MCG/ACT nasal spray, Place into both nostrils daily., Disp: , Rfl:    levothyroxine (SYNTHROID) 75 MCG tablet, Take 1 tablet (75 mcg total) by mouth daily before breakfast., Disp: 90 tablet, Rfl: 1   lubiprostone (AMITIZA) 24 MCG capsule, Take 1 capsule (24 mcg total) by mouth 2 (two) times daily with a meal., Disp: 60 capsule, Rfl: 3  Review of Symptoms: Pertinent positives as per HPI.  Physical Exam: Deferred given limitations of phone visit.  Laboratory & Radiologic Studies: MRI 07/07/23: 6 cm cystic lesion in the right adnexa. This lesion was present as far back as 2014 CT when it measured 3.5 cm. (O-RADS Category 2 - Almost Certainly Benign <1% ROM)   Prior hysterectomy.   Sigmoid diverticulosis, without radiographic evidence of diverticulitis.  Assessment & Plan: Mary Hart is a 82 y.o. woman with long standing minimally complex adnexal mass, no significant change on imaging.  Patient continues to do well, asymptomatic. Prefers to avoid surgery. We discussed recent MRI which is very reassuring. Given stable size of the mass and benign appearance, I think it would be reasonable to stop getting surveillance imaging. We also discussed that she could get a follow-up ultrasound in 1 year. Her preference is to stop surveillance imaging. She knows to call with any change in symptoms in the future.  I discussed the assessment and treatment plan with the patient. The patient was provided with an opportunity to ask  questions and all were answered. The patient agreed with the plan and demonstrated an understanding of the instructions.   The patient was  advised to call back or see an in-person evaluation if the symptoms worsen or if the condition fails to improve as anticipated.   8 minutes of total time was spent for this patient encounter, including preparation, phone counseling with the patient and coordination of care, and documentation of the encounter.   Eugene Garnet, MD  Division of Gynecologic Oncology  Department of Obstetrics and Gynecology  [facility]

## 2023-07-15 DIAGNOSIS — Z13220 Encounter for screening for lipoid disorders: Secondary | ICD-10-CM | POA: Diagnosis not present

## 2023-07-16 LAB — LIPID PANEL
Chol/HDL Ratio: 2.7 ratio (ref 0.0–4.4)
Cholesterol, Total: 208 mg/dL — ABNORMAL HIGH (ref 100–199)
HDL: 78 mg/dL (ref >39)
LDL Chol Calc (NIH): 113 mg/dL — ABNORMAL HIGH (ref 0–99)
Triglycerides: 100 mg/dL (ref 0–149)
VLDL Cholesterol Cal: 17 mg/dL (ref 5–40)

## 2023-07-18 ENCOUNTER — Telehealth: Payer: Self-pay

## 2023-07-18 NOTE — Telephone Encounter (Signed)
Tried calling patient with no answer. Left detailed via voiced message making patient aware. "Let pt know that her urine cytology test was normal--no cancer cells"

## 2023-07-20 ENCOUNTER — Ambulatory Visit (HOSPITAL_BASED_OUTPATIENT_CLINIC_OR_DEPARTMENT_OTHER): Payer: PPO

## 2023-07-28 DIAGNOSIS — H353133 Nonexudative age-related macular degeneration, bilateral, advanced atrophic without subfoveal involvement: Secondary | ICD-10-CM | POA: Diagnosis not present

## 2023-07-28 DIAGNOSIS — H353123 Nonexudative age-related macular degeneration, left eye, advanced atrophic without subfoveal involvement: Secondary | ICD-10-CM | POA: Diagnosis not present

## 2023-07-28 DIAGNOSIS — H353113 Nonexudative age-related macular degeneration, right eye, advanced atrophic without subfoveal involvement: Secondary | ICD-10-CM | POA: Diagnosis not present

## 2023-08-01 ENCOUNTER — Encounter (HOSPITAL_COMMUNITY): Payer: Self-pay

## 2023-08-01 ENCOUNTER — Ambulatory Visit (HOSPITAL_COMMUNITY)
Admission: RE | Admit: 2023-08-01 | Discharge: 2023-08-01 | Disposition: A | Discharge status: Home / Self Care | Payer: PPO | Source: Ambulatory Visit | Attending: Family Medicine | Admitting: Family Medicine

## 2023-08-01 DIAGNOSIS — Z1231 Encounter for screening mammogram for malignant neoplasm of breast: Secondary | ICD-10-CM | POA: Diagnosis not present

## 2023-08-01 DIAGNOSIS — M810 Age-related osteoporosis without current pathological fracture: Secondary | ICD-10-CM | POA: Diagnosis not present

## 2023-08-01 DIAGNOSIS — Z780 Asymptomatic menopausal state: Secondary | ICD-10-CM | POA: Insufficient documentation

## 2023-08-03 ENCOUNTER — Other Ambulatory Visit: Payer: Self-pay

## 2023-08-03 DIAGNOSIS — M818 Other osteoporosis without current pathological fracture: Secondary | ICD-10-CM

## 2023-08-09 ENCOUNTER — Ambulatory Visit: Payer: PPO

## 2023-08-09 DIAGNOSIS — Z23 Encounter for immunization: Secondary | ICD-10-CM

## 2023-08-16 ENCOUNTER — Ambulatory Visit: Payer: PPO | Admitting: "Endocrinology

## 2023-08-16 ENCOUNTER — Encounter: Payer: Self-pay | Admitting: "Endocrinology

## 2023-08-16 VITALS — BP 112/66 | HR 76 | Ht 62.0 in | Wt 162.2 lb

## 2023-08-16 DIAGNOSIS — M810 Age-related osteoporosis without current pathological fracture: Secondary | ICD-10-CM | POA: Insufficient documentation

## 2023-08-16 DIAGNOSIS — E039 Hypothyroidism, unspecified: Secondary | ICD-10-CM | POA: Diagnosis not present

## 2023-08-16 NOTE — Progress Notes (Signed)
Endocrinology Consult Note                                            08/16/2023, 9:03 AM   Subjective:    Patient ID: Mary Hart, female    DOB: 12-May-1941, PCP Tommie Sams, DO   Past Medical History:  Diagnosis Date   Anxiety    Arthritis    oa   Clostridium difficile infection 2013, 2015   Treated with vancomycin   COPD (chronic obstructive pulmonary disease) (HCC)    minimal shortness of breath   Depression    Diverticula of colon    Genital herpes    GERD (gastroesophageal reflux disease)    History of total knee replacement, right 03/28/2019   Hypothyroid    IBS (irritable bowel syndrome)    Internal hemorrhoids    Jaundice    as child cause unknown   Neuropathy    fingers both hands   PONV (postoperative nausea and vomiting)    did well with last neck fusion    Recurrent colitis due to Clostridium difficile 07/03/2013   4 treatments with vancomycin since late 2013/early 2014 - responds then relapses     Vocal cord dysfunction    SYASMONIC DYSPHONIA   Past Surgical History:  Procedure Laterality Date   ABDOMINAL HYSTERECTOMY     Anterior Cervical Fusion X 2     BREAST BIOPSY Bilateral    benign   CHOLECYSTECTOMY     COLONOSCOPY  06/20/2008   [facility] Dr. Damita Dunnings external hemorrhoids   COLONOSCOPY  10/12/2012   Dr. Jena Gauss- internal hemorrhoids, very early, few, sigmoid diverticula o/w normal appearing colon and terminal ileum. + cdiff on stool samples taken. Segmental bx negative.   ESOPHAGOGASTRODUODENOSCOPY  11/25/2010   Rourk- tubular esophagus s/p of 56 dilator from a small HH/otherwise normal   EYE SURGERY Bilateral 2012   ioc with lens replacement for cataracts   JOINT REPLACEMENT  09/2010   Thumb left   meniscus repair right knee  2014   NASAL SEPTOPLASTY W/ TURBINOPLASTY Bilateral 12/07/2021   Procedure: NASAL SEPTOPLASTY WITH TURBINATE REDUCTION;  Surgeon: Newman Pies, MD;  Location: [facility];  Service: ENT;   Laterality: Bilateral;   TOTAL KNEE ARTHROPLASTY Right 12/27/2016   Procedure: RIGHT TOTAL KNEE ARTHROPLASTY;  Surgeon: Ollen Gross, MD;  Location: [facility];  Service: Orthopedics;  Laterality: Right;  Adductor Block   VESICOVAGINAL FISTULA CLOSURE W/ TAH     Social History   Socioeconomic History   Marital status: Widowed    Spouse name: Not on file   Number of children: 4   Years of education: Not on file   Highest education level: Not on file  Occupational History   Occupation: Retired    Associate Professor: RETIRED  Tobacco Use   Smoking status: Former    Current packs/day: 0.00    Average packs/day: 0.7 packs/day for 50.0 years (33.0 ttl pk-yrs)    Types: Cigarettes    Start date: 35    Quit date: 2017    Years since quitting: 7.8   Smokeless tobacco: Never   Tobacco comments:    History of 1/2 pack per day  Vaping Use   Vaping status: Former  Substance and Sexual Activity   Alcohol use: Yes    Comment: occasional glass of wine  Drug use: No   Sexual activity: Not Currently  Other Topics Concern   Not on file  Social History Narrative   Married, 2 sons and 2 daughters   Husband chronically ill and she helps with care   She is retired   Chemical engineer Strain: Low Risk  (04/22/2023)   Overall Financial Resource Strain (CARDIA)    Difficulty of Paying Living Expenses: Not hard at all  Food Insecurity: No Food Insecurity (04/22/2023)   Hunger Vital Sign    Worried About Running Out of Food in the Last Year: Never true    Ran Out of Food in the Last Year: Never true  Transportation Needs: No Transportation Needs (04/22/2023)   PRAPARE - Administrator, Civil Service (Medical): No    Lack of Transportation (Non-Medical): No  Physical Activity: Sufficiently Active (04/22/2023)   Exercise Vital Sign    Days of Exercise per Week: 4 days    Minutes of Exercise per Session: 60 min  Stress: No Stress Concern Present (04/22/2023)    [employer] of Occupational Health - Occupational Stress Questionnaire    Feeling of Stress : Not at all  Social Connections: Socially Integrated (04/22/2023)   Social Connection and Isolation Panel [NHANES]    Frequency of Communication with Friends and Family: More than three times a week    Frequency of Social Gatherings with Friends and Family: More than three times a week    Attends Religious Services: More than 4 times per year    Active Member of [employer] or Organizations: Yes    Attends Engineer, structural: More than 4 times per year    Marital Status: Married   Family History  Problem Relation Age of Onset   Dementia Mother    Breast cancer Mother    Dementia Father    Esophageal cancer Brother    Colon cancer Maternal Uncle    Breast cancer Cousin    Allergic rhinitis Neg Hx    Asthma Neg Hx    Eczema Neg Hx    Urticaria Neg Hx    Prostate cancer Neg Hx    Pancreatic cancer Neg Hx    Ovarian cancer Neg Hx    Endometrial cancer Neg Hx    Outpatient Encounter Medications as of 08/16/2023  Medication Sig   albuterol (VENTOLIN HFA) 108 (90 Base) MCG/ACT inhaler Inhale 1-2 puffs into the lungs every 6 (six) hours as needed for wheezing or shortness of breath.   ALPRAZolam (XANAX) 0.25 MG tablet Take 1 tablet by mouth daily as needed for anxiety.   Aspirin-Salicylamide-Caffeine (ARTHRITIS STRENGTH BC POWDER PO) Take by mouth. One daily   BREO ELLIPTA 200-25 MCG/ACT AEPB Inhale 1 puff by mouth once daily   cetirizine (ZYRTEC) 10 MG tablet Take 10 mg by mouth at bedtime.   cholecalciferol (VITAMIN D3) 25 MCG (1000 UNIT) tablet Take 1,000 Units by mouth daily.   fluticasone (FLONASE) 50 MCG/ACT nasal spray Place into both nostrils daily.   levothyroxine (SYNTHROID) 75 MCG tablet Take 1 tablet (75 mcg total) by mouth daily before breakfast.   [DISCONTINUED] lubiprostone (AMITIZA) 24 MCG capsule Take 1 capsule (24 mcg total) by mouth 2 (two) times daily with a meal.    No facility-administered encounter medications on file as of 08/16/2023.   ALLERGIES: Allergies  Allergen Reactions   Oxycodone Other (See Comments) and Itching   Codeine Nausea And Vomiting   Hydrocodone  Hydromorphone Hcl Itching   Levonorgestrel-Ethinyl Estrad Other (See Comments)   Morphine Nausea And Vomiting   Moxifloxacin Other (See Comments)    N/V/D, skin red. Tolerates Levaquin and Cipro.   Sulfonamide Derivatives Other (See Comments)    "almost died" "pulse went down to 12"    VACCINATION STATUS: Immunization History  Administered Date(s) Administered   Fluad Trivalent(High Dose 65+) 08/09/2023   Influenza, High Dose Seasonal PF 08/29/2018, 07/29/2019   Influenza-Unspecified 08/25/2018   PFIZER(Purple Top)SARS-COV-2 Vaccination 11/24/2019, 12/15/2019, 07/24/2020    HPI Sinclair D Dacruz is 82 y.o. female who presents today with a medical history as above. she is being seen in consultation for osteoporosis requested by Tommie Sams, DO.  History is obtained from the patient as well as her available chart review.  She reports that she was diagnosed with osteoporosis approximately 10 years ago.  After taking Fosamax for 2-3 years, she was advised by her GI providers not to take it.  This is due to her GERD.  She is not taking any medications for GERD  at this time.   She was subsequently treated with yearly Reclast approximately for 5 years.  Her last infusion was in 2019. She does have bone density from August 01, 2023 at [facility] health facility.  This showed T-score of -1.1 on L1-2, -2.5 on left femoral neck, -1.6 on the dual femur.  L3-L4 vertebral spine was excluded due to degenerative joint disease. Her prior bone density studies are not available to review. She denies any history of fragility fractures.  She has lost 2 to 3 inches of height over the years. Her exercise and diet regimen are favorable. She does have hypothyroidism for approximately 40 years for which  she is taking levothyroxine currently 75 mcg p.o. daily. She is on vitamin D 8 100 units daily. Her most recent CMP, renal functions are favorable. She wishes to minimize cost of her osteoporosis treatment. She has dental cavities, however no planned dental procedure.  Review of Systems  Constitutional: +mildly fluctuating body weight, no fatigue, no subjective hyperthermia, no subjective hypothermia Eyes: no blurry vision, no xerophthalmia ENT: no sore throat, no nodules palpated in throat, no dysphagia/odynophagia, no hoarseness Cardiovascular: no Chest Pain, no Shortness of Breath, no palpitations, no leg swelling Respiratory: no cough, no shortness of breath Gastrointestinal: no Nausea/Vomiting/Diarhhea Musculoskeletal: no muscle/joint aches Skin: no rashes Neurological: no tremors, no numbness, no tingling, no dizziness Psychiatric: no depression, no anxiety  Objective:       08/16/2023    8:52 AM 07/12/2023   11:33 AM 07/12/2023   11:32 AM  Vitals with BMI  Height 5\' 2"   5\' 2"   Weight 162 lbs 3 oz  167 lbs  BMI 29.66  30.54  Systolic  130 144  Diastolic  83 78  Pulse   65    Ht 5\' 2"  (1.575 m)   Wt 162 lb 3.2 oz (73.6 kg)   BMI 29.67 kg/m   Wt Readings from Last 3 Encounters:  08/16/23 162 lb 3.2 oz (73.6 kg)  07/12/23 167 lb (75.8 kg)  07/11/23 167 lb 3.2 oz (75.8 kg)    Physical Exam  Constitutional:  Body mass index is 29.67 kg/m.,  not in acute distress, normal state of mind Eyes: PERRLA, EOMI, no exophthalmos ENT: moist mucous membranes, no gross thyromegaly, no gross cervical lymphadenopathy Cardiovascular: normal precordial activity, Regular Rate and Rhythm, no Murmur/Rubs/Gallops Respiratory:  adequate breathing efforts, no gross chest deformity, Clear to auscultation bilaterally  Gastrointestinal: abdomen soft, Non -tender, No distension, Bowel Sounds present, no gross organomegaly Musculoskeletal: no gross deformities, strength intact in all four  extremities, no peripheral edema Skin: moist, warm, no rashes Neurological: no tremor with outstretched hands, Deep tendon reflexes normal in bilateral lower extremities.  CMP ( most recent) CMP     Component Value Date/Time   NA 139 05/10/2023 1555   K 5.0 05/10/2023 1555   CL 100 05/10/2023 1555   CO2 25 05/10/2023 1555   GLUCOSE 85 05/10/2023 1555   GLUCOSE 95 10/28/2022 1439   BUN 9 05/10/2023 1555   CREATININE 0.83 05/10/2023 1555   CREATININE 0.85 03/13/2013 1209   CALCIUM 9.1 05/10/2023 1555   PROT 6.2 05/10/2023 1555   ALBUMIN 4.2 05/10/2023 1555   AST 21 05/10/2023 1555   ALT 22 05/10/2023 1555   ALKPHOS 91 05/10/2023 1555   BILITOT <0.2 05/10/2023 1555   GFR 69.50 02/16/2017 1440   EGFR 71 05/10/2023 1555   GFRNONAA 53 (L) 10/28/2022 1439      Lipid Panel ( most recent) Lipid Panel     Component Value Date/Time   CHOL 208 (H) 07/15/2023 1107   TRIG 100 07/15/2023 1107   HDL 78 07/15/2023 1107   CHOLHDL 2.7 07/15/2023 1107   LDLCALC 113 (H) 07/15/2023 1107   LABVLDL 17 07/15/2023 1107      Lab Results  Component Value Date   TSH 0.920 02/01/2023       Assessment & Plan:   Osteoporosis-likely age-related  - Dacota D Erhard  is being seen at a kind request of Cook, Jayce G, DO. - I have reviewed her available  records and clinically evaluated the patient. - Based on these reviews, she has osteoporosis at least based on her last bone density in October 2024, 5 years after her last Reclast infusion. -However, prior bone density studies has not evidence of to review and compare her treatment response.  We will request for her prior imaging/DEXA studies before deciding on to offer her next option of treatment. If she is found to be losing significant bone density, she will be offered her next option of treatment with Prolia. There is no more utility left for bisphosphonates, either oral or IV at this time in light of her previous treatment with Fosamax  as well as Reclast for 5 years.  If her bone density is observed to be stable, she will be considered for drug holiday until next bone density.  - I did not initiate any new prescriptions today. - she is advised to maintain close follow up with Tommie Sams, DO for primary care needs.   -Thank you for involving me in the care of this pleasant patient.  Time spent with the patient: 50  minutes, of which >50% was spent in  counseling her about her osteoporosis and the rest in obtaining information about her symptoms, reviewing her previous labs/studies ( including abstractions from other facilities),  evaluations, and treatments,  and developing a plan to confirm diagnosis and long term treatment based on the latest standards of care/guidelines; and documenting her care.  Verginia D Rasch participated in the discussions, expressed understanding, and voiced agreement with the above plans.  All questions were answered to her satisfaction. she is encouraged to contact clinic should she have any questions or concerns prior to her return visit.  Follow up plan: No follow-ups on file.   Marquis Lunch, MD [facility] [address]  [address] [place], [place] [address] Phone: [phone]  Fax: [phone]     08/16/2023, 9:03 AM  This note was partially dictated with voice recognition software. Similar sounding words can be transcribed inadequately or may not  be corrected upon review.

## 2023-08-23 ENCOUNTER — Ambulatory Visit: Payer: PPO | Admitting: Gastroenterology

## 2023-08-23 ENCOUNTER — Encounter: Payer: Self-pay | Admitting: Gastroenterology

## 2023-08-23 VITALS — BP 116/75 | HR 67 | Temp 98.6°F | Ht 62.0 in | Wt 161.8 lb

## 2023-08-23 DIAGNOSIS — K5900 Constipation, unspecified: Secondary | ICD-10-CM | POA: Diagnosis not present

## 2023-08-23 NOTE — Patient Instructions (Signed)
I am glad you are doing better! Please call if any concerns going forward.  You can take the Miralax in the evening if this is more helpful!  Please call or message with any concerns, and we will see you back as needed!  I enjoyed seeing you again today! I value our relationship and want to provide genuine, compassionate, and quality care. You may receive a survey regarding your visit with me, and I welcome your feedback! Thanks so much for taking the time to complete this. I look forward to seeing you again.      Gelene Mink, PhD, ANP-BC [facility]

## 2023-08-23 NOTE — Progress Notes (Signed)
Gastroenterology Office Note     Primary Care Physician:  Tommie Sams, DO  Primary Gastroenterologist: Dr. Marletta Lor    Chief Complaint   Chief Complaint  Patient presents with   Follow-up    Follow up on constipation. Better not great, taking Miralax and fiber     History of Present Illness   Mary Hart is a delightful 82 y.o. female presenting today with a history of chronic constipation, pelvic floor dysfunction s/p PT without improvement, GERD.  Prior therapies: LInzess caused gas and abdominal pain, incomplete evacuation, unsure if trulance is covered.   She was to start Amitiza, but she was in the donut hole.  Not wonderful but much much better. About 2-3 times over past 6 weeks had to use dulcolax in addition to Miralax and fiber gummies. Taking 2 fiber gummies a day.  Just last week started miralax daily instead of every other day. Taking Miralax in the morning. Sometimes worried about taking in the mornings if going to her exercise classes.   Colonoscopy June 2019 with sigmoid diverticulosis as well as slight narrowing associated with diverticulosis. No polyps    Past Medical History:  Diagnosis Date   Anxiety    Arthritis    oa   Clostridium difficile infection 2013, 2015   Treated with vancomycin   COPD (chronic obstructive pulmonary disease) (HCC)    minimal shortness of breath   Depression    Diverticula of colon    Genital herpes    GERD (gastroesophageal reflux disease)    History of total knee replacement, right 03/28/2019   Hypothyroid    IBS (irritable bowel syndrome)    Internal hemorrhoids    Jaundice    as child cause unknown   Neuropathy    fingers both hands   PONV (postoperative nausea and vomiting)    did well with last neck fusion    Recurrent colitis due to Clostridium difficile 07/03/2013   4 treatments with vancomycin since late 2013/early 2014 - responds then relapses     Vocal cord dysfunction    SYASMONIC DYSPHONIA     Past Surgical History:  Procedure Laterality Date   ABDOMINAL HYSTERECTOMY     Anterior Cervical Fusion X 2     BREAST BIOPSY Bilateral    benign   CHOLECYSTECTOMY     COLONOSCOPY  06/20/2008   [facility] Dr. Damita Dunnings external hemorrhoids   COLONOSCOPY  10/12/2012   Dr. Jena Gauss- internal hemorrhoids, very early, few, sigmoid diverticula o/w normal appearing colon and terminal ileum. + cdiff on stool samples taken. Segmental bx negative.   ESOPHAGOGASTRODUODENOSCOPY  11/25/2010   Rourk- tubular esophagus s/p of 56 dilator from a small HH/otherwise normal   EYE SURGERY Bilateral 2012   ioc with lens replacement for cataracts   JOINT REPLACEMENT  09/2010   Thumb left   meniscus repair right knee  2014   NASAL SEPTOPLASTY W/ TURBINOPLASTY Bilateral 12/07/2021   Procedure: NASAL SEPTOPLASTY WITH TURBINATE REDUCTION;  Surgeon: Newman Pies, MD;  Location: [facility];  Service: ENT;  Laterality: Bilateral;   TOTAL KNEE ARTHROPLASTY Right 12/27/2016   Procedure: RIGHT TOTAL KNEE ARTHROPLASTY;  Surgeon: Ollen Gross, MD;  Location: [facility];  Service: Orthopedics;  Laterality: Right;  Adductor Block   VESICOVAGINAL FISTULA CLOSURE W/ TAH      Current Outpatient Medications  Medication Sig Dispense Refill   albuterol (VENTOLIN HFA) 108 (90 Base) MCG/ACT inhaler Inhale 1-2 puffs into the lungs every 6 (six)  hours as needed for wheezing or shortness of breath.     ALPRAZolam (XANAX) 0.25 MG tablet Take 1 tablet by mouth daily as needed for anxiety. 20 tablet 0   Aspirin-Salicylamide-Caffeine (ARTHRITIS STRENGTH BC POWDER PO) Take by mouth. One daily prn     BREO ELLIPTA 200-25 MCG/ACT AEPB Inhale 1 puff by mouth once daily 180 each 0   cetirizine (ZYRTEC) 10 MG tablet Take 10 mg by mouth at bedtime.     cholecalciferol (VITAMIN D3) 25 MCG (1000 UNIT) tablet Take 1,000 Units by mouth daily.     fluticasone (FLONASE) 50 MCG/ACT nasal spray Place into both nostrils daily.      levothyroxine (SYNTHROID) 75 MCG tablet Take 1 tablet (75 mcg total) by mouth daily before breakfast. 90 tablet 1   magnesium aspartate (MAGINEX) 615 MG tablet Take 615 mg by mouth 2 (two) times daily.     methylcellulose oral powder Take by mouth daily.     polyethylene glycol powder (GLYCOLAX/MIRALAX) 17 GM/SCOOP powder Take 1 Container by mouth once.     No current facility-administered medications for this visit.    Allergies as of 08/23/2023 - Review Complete 08/23/2023  Allergen Reaction Noted   Oxycodone Other (See Comments) and Itching 11/05/2022   Codeine Nausea And Vomiting    Hydrocodone  08/19/2019   Hydromorphone hcl Itching    Levonorgestrel-ethinyl estrad Other (See Comments) 11/05/2022   Morphine Nausea And Vomiting    Moxifloxacin Other (See Comments) 11/05/2022   Sulfonamide derivatives Other (See Comments)     Family History  Problem Relation Age of Onset   Dementia Mother    Breast cancer Mother    Dementia Father    Esophageal cancer Brother    Colon cancer Maternal Uncle    Breast cancer Cousin    Allergic rhinitis Neg Hx    Asthma Neg Hx    Eczema Neg Hx    Urticaria Neg Hx    Prostate cancer Neg Hx    Pancreatic cancer Neg Hx    Ovarian cancer Neg Hx    Endometrial cancer Neg Hx     Social History   Socioeconomic History   Marital status: Widowed    Spouse name: Not on file   Number of children: 4   Years of education: Not on file   Highest education level: Not on file  Occupational History   Occupation: Retired    Associate Professor: RETIRED  Tobacco Use   Smoking status: Former    Current packs/day: 0.00    Average packs/day: 0.7 packs/day for 50.0 years (33.0 ttl pk-yrs)    Types: Cigarettes    Start date: 22    Quit date: 2017    Years since quitting: 7.8   Smokeless tobacco: Never   Tobacco comments:    History of 1/2 pack per day  Vaping Use   Vaping status: Former  Substance and Sexual Activity   Alcohol use: Yes    Comment:  occasional glass of wine   Drug use: No   Sexual activity: Not Currently  Other Topics Concern   Not on file  Social History Narrative   Married, 2 sons and 2 daughters   Husband chronically ill and she helps with care   She is retired   International aid/development worker of Corporate investment banker Strain: Low Risk  (04/22/2023)   Overall Financial Resource Strain (CARDIA)    Difficulty of Paying Living Expenses: Not hard at all  Food Insecurity: No Food  Insecurity (04/22/2023)   Hunger Vital Sign    Worried About Running Out of Food in the Last Year: Never true    Ran Out of Food in the Last Year: Never true  Transportation Needs: No Transportation Needs (04/22/2023)   PRAPARE - Administrator, Civil Service (Medical): No    Lack of Transportation (Non-Medical): No  Physical Activity: Sufficiently Active (04/22/2023)   Exercise Vital Sign    Days of Exercise per Week: 4 days    Minutes of Exercise per Session: 60 min  Stress: No Stress Concern Present (04/22/2023)   [employer] of Occupational Health - Occupational Stress Questionnaire    Feeling of Stress : Not at all  Social Connections: Socially Integrated (04/22/2023)   Social Connection and Isolation Panel [NHANES]    Frequency of Communication with Friends and Family: More than three times a week    Frequency of Social Gatherings with Friends and Family: More than three times a week    Attends Religious Services: More than 4 times per year    Active Member of [employer] or Organizations: Yes    Attends Engineer, structural: More than 4 times per year    Marital Status: Married  Catering manager Violence: Not At Risk (04/22/2023)   Humiliation, Afraid, Rape, and Kick questionnaire    Fear of Current or Ex-Partner: No    Emotionally Abused: No    Physically Abused: No    Sexually Abused: No     Review of Systems   Gen: Denies any fever, chills, fatigue, weight loss, lack of appetite.  CV: Denies chest pain,  heart palpitations, peripheral edema, syncope.  Resp: Denies shortness of breath at rest or with exertion. Denies wheezing or cough.  GI: Denies dysphagia or odynophagia. Denies jaundice, hematemesis, fecal incontinence. GU : Denies urinary burning, urinary frequency, urinary hesitancy MS: Denies joint pain, muscle weakness, cramps, or limitation of movement.  Derm: Denies rash, itching, dry skin Psych: Denies depression, anxiety, memory loss, and confusion Heme: Denies bruising, bleeding, and enlarged lymph nodes.   Physical Exam   BP 116/75   Pulse 67   Temp 98.6 F (37 C)   Ht 5\' 2"  (1.575 m)   Wt 161 lb 12.8 oz (73.4 kg)   BMI 29.59 kg/m  General:   Alert and oriented. Pleasant and cooperative. Well-nourished and well-developed.  Head:  Normocephalic and atraumatic. Eyes:  Without icterus Abdomen:  +BS, soft, non-tender and non-distended. No HSM noted. No guarding or rebound. No masses appreciated.  Rectal:  Deferred  Msk:  Symmetrical without gross deformities. Normal posture. Extremities:  Without edema. Neurologic:  Alert and  oriented x4;  grossly normal neurologically. Skin:  Intact without significant lesions or rashes. Psych:  Alert and cooperative. Normal mood and affect.   Assessment   Mary Hart is a delightful 82 y.o. female presenting today with a history of chronic constipation, pelvic floor dysfunction s/p PT without improvement, GERD.  Constipation: improvement using fiber gummies daily and Miralax daily. We discussed trying Miralax in evening to see if this helps with more consistency of when she has a BM the following morning. She would like to avoid any prescriptive agents currently. Colonoscopy on file 2019 and no repeat indicated due to age.     PLAN    Continue fiber with Miralax Call if any issues Will see prn    Gelene Mink, PhD, [facility]enter[facility]

## 2023-09-08 DIAGNOSIS — H26491 Other secondary cataract, right eye: Secondary | ICD-10-CM | POA: Diagnosis not present

## 2023-09-08 DIAGNOSIS — H35033 Hypertensive retinopathy, bilateral: Secondary | ICD-10-CM | POA: Diagnosis not present

## 2023-09-08 DIAGNOSIS — H353133 Nonexudative age-related macular degeneration, bilateral, advanced atrophic without subfoveal involvement: Secondary | ICD-10-CM | POA: Diagnosis not present

## 2023-09-08 DIAGNOSIS — H43813 Vitreous degeneration, bilateral: Secondary | ICD-10-CM | POA: Diagnosis not present

## 2023-09-09 ENCOUNTER — Telehealth: Payer: Self-pay

## 2023-09-09 DIAGNOSIS — Z6828 Body mass index (BMI) 28.0-28.9, adult: Secondary | ICD-10-CM | POA: Diagnosis not present

## 2023-09-09 DIAGNOSIS — E663 Overweight: Secondary | ICD-10-CM | POA: Diagnosis not present

## 2023-09-09 DIAGNOSIS — M545 Low back pain, unspecified: Secondary | ICD-10-CM | POA: Diagnosis not present

## 2023-09-09 DIAGNOSIS — R030 Elevated blood-pressure reading, without diagnosis of hypertension: Secondary | ICD-10-CM | POA: Diagnosis not present

## 2023-09-09 NOTE — Telephone Encounter (Signed)
Reason for CRM: Pt std she sprained a muscle on Mon 11/11 have done everything to calm pain, nothing is working and would like to know if Dr. Could send a order for Felxeril due to it getting worse

## 2023-09-11 ENCOUNTER — Other Ambulatory Visit: Payer: Self-pay | Admitting: Family Medicine

## 2023-09-11 MED ORDER — BACLOFEN 10 MG PO TABS
5.0000 mg | ORAL_TABLET | Freq: Three times a day (TID) | ORAL | 0 refills | Status: DC | PRN
Start: 2023-09-11 — End: 2023-09-15

## 2023-09-12 NOTE — Telephone Encounter (Signed)
Called pt and informed her that the rx for new muscle relaxer was sent in per Dr Adriana Simas

## 2023-09-15 ENCOUNTER — Ambulatory Visit: Payer: PPO | Admitting: "Endocrinology

## 2023-09-15 ENCOUNTER — Encounter: Payer: Self-pay | Admitting: "Endocrinology

## 2023-09-15 VITALS — BP 116/68 | HR 92 | Ht 62.0 in | Wt 157.0 lb

## 2023-09-15 DIAGNOSIS — E039 Hypothyroidism, unspecified: Secondary | ICD-10-CM

## 2023-09-15 DIAGNOSIS — M810 Age-related osteoporosis without current pathological fracture: Secondary | ICD-10-CM

## 2023-09-15 NOTE — Progress Notes (Signed)
09/15/2023, 1:43 PM   Endocrinology follow-up note  Subjective:    Patient ID: Mary Hart, female    DOB: [birth year], PCP Tommie Sams, DO   Past Medical History:  Diagnosis Date   Anxiety    Arthritis    oa   Clostridium difficile infection 2013, 2015   Treated with vancomycin   COPD (chronic obstructive pulmonary disease) (HCC)    minimal shortness of breath   Depression    Diverticula of colon    Genital herpes    GERD (gastroesophageal reflux disease)    History of total knee replacement, right 03/28/2019   Hypothyroid    IBS (irritable bowel syndrome)    Internal hemorrhoids    Jaundice    as child cause unknown   Neuropathy    fingers both hands   PONV (postoperative nausea and vomiting)    did well with last neck fusion    Recurrent colitis due to Clostridium difficile 07/03/2013   4 treatments with vancomycin since late 2013/early 2014 - responds then relapses     Vocal cord dysfunction    SYASMONIC DYSPHONIA   Past Surgical History:  Procedure Laterality Date   ABDOMINAL HYSTERECTOMY     Anterior Cervical Fusion X 2     BREAST BIOPSY Bilateral    benign   CHOLECYSTECTOMY     COLONOSCOPY  06/20/2008   [facility] Dr. Damita Dunnings external hemorrhoids   COLONOSCOPY  10/12/2012   Dr. Jena Gauss- internal hemorrhoids, very early, few, sigmoid diverticula o/w normal appearing colon and terminal ileum. + cdiff on stool samples taken. Segmental bx negative.   ESOPHAGOGASTRODUODENOSCOPY  11/25/2010   Rourk- tubular esophagus s/p of 56 dilator from a small HH/otherwise normal   EYE SURGERY Bilateral 2012   ioc with lens replacement for cataracts   JOINT REPLACEMENT  09/2010   Thumb left   meniscus repair right knee  2014   NASAL SEPTOPLASTY W/ TURBINOPLASTY Bilateral 12/07/2021   Procedure: NASAL SEPTOPLASTY WITH TURBINATE REDUCTION;  Surgeon: Newman Pies, MD;  [facility];  Service: ENT;   Laterality: Bilateral;   TOTAL KNEE ARTHROPLASTY Right 12/27/2016   Procedure: RIGHT TOTAL KNEE ARTHROPLASTY;  Surgeon: Ollen Gross, MD;  Location: [facility];  Service: Orthopedics;  Laterality: Right;  Adductor Block   VESICOVAGINAL FISTULA CLOSURE W/ TAH     Social History   Socioeconomic History   Marital status: Widowed    Spouse name: Not on file   Number of children: 4   Years of education: Not on file   Highest education level: Not on file  Occupational History   Occupation: Retired    Associate Professor: RETIRED  Tobacco Use   Smoking status: Former    Current packs/day: 0.00    Average packs/day: 0.7 packs/day for 50.0 years (33.0 ttl pk-yrs)    Types: Cigarettes    Start date: 74    Quit date: 2017    Years since quitting: 7.8   Smokeless tobacco: Never   Tobacco comments:    History of 1/2 pack per day  Vaping Use   Vaping status: Former  Substance and Sexual Activity   Alcohol use: Yes    Comment: occasional glass of wine  Drug use: No   Sexual activity: Not Currently  Other Topics Concern   Not on file  Social History Narrative   Married, 2 sons and 2 daughters   Husband chronically ill and she helps with care   She is retired   Chemical engineer Strain: Low Risk  (04/22/2023)   Overall Financial Resource Strain (CARDIA)    Difficulty of Paying Living Expenses: Not hard at all  Food Insecurity: No Food Insecurity (04/22/2023)   Hunger Vital Sign    Worried About Running Out of Food in the Last Year: Never true    Ran Out of Food in the Last Year: Never true  Transportation Needs: No Transportation Needs (04/22/2023)   PRAPARE - Administrator, Civil Service (Medical): No    Lack of Transportation (Non-Medical): No  Physical Activity: Sufficiently Active (04/22/2023)   Exercise Vital Sign    Days of Exercise per Week: 4 days    Minutes of Exercise per Session: 60 min  Stress: No Stress Concern Present (04/22/2023)    [employer] of Occupational Health - Occupational Stress Questionnaire    Feeling of Stress : Not at all  Social Connections: Socially Integrated (04/22/2023)   Social Connection and Isolation Panel [NHANES]    Frequency of Communication with Friends and Family: More than three times a week    Frequency of Social Gatherings with Friends and Family: More than three times a week    Attends Religious Services: More than 4 times per year    Active Member of [employer] or Organizations: Yes    Attends Engineer, structural: More than 4 times per year    Marital Status: Married   Family History  Problem Relation Age of Onset   Dementia Mother    Breast cancer Mother    Dementia Father    Esophageal cancer Brother    Colon cancer Maternal Uncle    Breast cancer Cousin    Allergic rhinitis Neg Hx    Asthma Neg Hx    Eczema Neg Hx    Urticaria Neg Hx    Prostate cancer Neg Hx    Pancreatic cancer Neg Hx    Ovarian cancer Neg Hx    Endometrial cancer Neg Hx    Outpatient Encounter Medications as of 09/15/2023  Medication Sig   Magnesium 200 MG CHEW Chew 2 tablets by mouth daily.   albuterol (VENTOLIN HFA) 108 (90 Base) MCG/ACT inhaler Inhale 1-2 puffs into the lungs every 6 (six) hours as needed for wheezing or shortness of breath.   ALPRAZolam (XANAX) 0.25 MG tablet Take 1 tablet by mouth daily as needed for anxiety.   Aspirin-Salicylamide-Caffeine (ARTHRITIS STRENGTH BC POWDER PO) Take by mouth. One daily prn   BREO ELLIPTA 200-25 MCG/ACT AEPB Inhale 1 puff by mouth once daily   cetirizine (ZYRTEC) 10 MG tablet Take 10 mg by mouth at bedtime.   cholecalciferol (VITAMIN D3) 25 MCG (1000 UNIT) tablet Take 1,000 Units by mouth daily.   fluticasone (FLONASE) 50 MCG/ACT nasal spray Place into both nostrils daily.   levothyroxine (SYNTHROID) 75 MCG tablet Take 1 tablet (75 mcg total) by mouth daily before breakfast.   methylcellulose oral powder Take by mouth daily.   polyethylene  glycol powder (GLYCOLAX/MIRALAX) 17 GM/SCOOP powder Take 1 Container by mouth once.   [DISCONTINUED] baclofen (LIORESAL) 10 MG tablet Take 0.5-1 tablets (5-10 mg total) by mouth 3 (three) times daily as needed.   [  DISCONTINUED] magnesium aspartate (MAGINEX) 615 MG tablet Take 615 mg by mouth 2 (two) times daily.   No facility-administered encounter medications on file as of 09/15/2023.   ALLERGIES: Allergies  Allergen Reactions   Oxycodone Other (See Comments) and Itching   Codeine Nausea And Vomiting   Hydrocodone    Hydromorphone Hcl Itching   Levonorgestrel-Ethinyl Estrad Other (See Comments)   Morphine Nausea And Vomiting   Moxifloxacin Other (See Comments)    N/V/D, skin red. Tolerates Levaquin and Cipro.   Sulfonamide Derivatives Other (See Comments)    "almost died" "pulse went down to 12"    VACCINATION STATUS: Immunization History  Administered Date(s) Administered   Fluad Trivalent(High Dose 65+) 08/09/2023   Influenza, High Dose Seasonal PF 08/29/2018, 07/29/2019   Influenza-Unspecified 08/25/2018   PFIZER(Purple Top)SARS-COV-2 Vaccination 11/24/2019, 12/15/2019, 07/24/2020    HPI Mary Hart is 82 y.o. female who presents today with a medical history as above. she is being seen in consultation for osteoporosis requested by Tommie Sams, DO.  History is obtained from the patient as well as her available chart review.  She reports that she was diagnosed with osteoporosis approximately 10 years ago.  After taking Fosamax for 2-3 years, she was advised by her GI providers not to take it.  This is due to her GERD.  She is not taking any medications for GERD  at this time.   She was subsequently treated with yearly Reclast approximately for 5 years.  Her last infusion was in 2019. She does have bone density from August 01, 2023 at [facility].  This showed T-score of -1.1 on L1-2, -2.5 on left femoral neck, -1.6 on the dual femur.  L3-L4 vertebral spine was  excluded due to degenerative joint disease. -She brought her prior bone density study [facility] [place] as well as l[facility] imaging center in [facility]. -Her last study from October 2022 showed a T-score of -1.3 on AP spine and -2.2 on -left femoral neck.   She denies any history of fragility fractures.  Per her report, she has lost 2 to 3 inches of height over the years. Her exercise and diet regimen are favorable. She does have hypothyroidism for approximately 40 years for which she is taking levothyroxine currently 75 mcg p.o. daily. She is on vitamin D 8 100 units daily. Her most recent CMP, renal functions are favorable. She wishes to minimize cost of her osteoporosis treatment. She has dental cavities, however no planned dental procedure.  Review of Systems  Constitutional: +mildly fluctuating body weight, no fatigue, no subjective hyperthermia, no subjective hypothermia Eyes: no blurry vision, no xerophthalmia  Objective:       09/15/2023   10:07 AM 08/23/2023   11:30 AM 08/16/2023    8:52 AM  Vitals with BMI  Height 5\' 2"  5\' 2"  5\' 2"   Weight 157 lbs 161 lbs 13 oz 162 lbs 3 oz  BMI 28.71 29.59 29.66  Systolic 116 116 578  Diastolic 68 75 66  Pulse 92 67 76    BP 116/68   Pulse 92   Ht 5\' 2"  (1.575 m)   Wt 157 lb (71.2 kg)   BMI 28.72 kg/m   Wt Readings from Last 3 Encounters:  09/15/23 157 lb (71.2 kg)  08/23/23 161 lb 12.8 oz (73.4 kg)  08/16/23 162 lb 3.2 oz (73.6 kg)    Physical Exam  Constitutional:  Body mass index is 28.72 kg/m.,  not  in acute distress, normal state of mind Eyes: PERRLA, EOMI, no exophthalmos ENT: moist mucous membranes, no gross thyromegaly, no gross cervical lymphadenopathy   CMP ( most recent) CMP     Component Value Date/Time   NA 139 05/10/2023 1555   K 5.0 05/10/2023 1555   CL 100 05/10/2023 1555   CO2 25 05/10/2023 1555   GLUCOSE 85 05/10/2023 1555   GLUCOSE 95 10/28/2022 1439    BUN 9 05/10/2023 1555   CREATININE 0.83 05/10/2023 1555   CREATININE 0.85 03/13/2013 1209   CALCIUM 9.1 05/10/2023 1555   PROT 6.2 05/10/2023 1555   ALBUMIN 4.2 05/10/2023 1555   AST 21 05/10/2023 1555   ALT 22 05/10/2023 1555   ALKPHOS 91 05/10/2023 1555   BILITOT <0.2 05/10/2023 1555   GFR 69.50 02/16/2017 1440   EGFR 71 05/10/2023 1555   GFRNONAA 53 (L) 10/28/2022 1439      Lipid Panel ( most recent) Lipid Panel     Component Value Date/Time   CHOL 208 (H) 07/15/2023 1107   TRIG 100 07/15/2023 1107   HDL 78 07/15/2023 1107   CHOLHDL 2.7 07/15/2023 1107   LDLCALC 113 (H) 07/15/2023 1107   LABVLDL 17 07/15/2023 1107      Lab Results  Component Value Date   TSH 0.920 02/01/2023     Last DEXA scan from August 01, 2023 summarized: AP Spine L1-L2 08/01/2023 80.4 Osteopenia -1.1 1.027 g/cm2 - -   DualFemur Neck Left 08/01/2023 80.4 Osteoporosis -2.5 0.696 g/cm2 -  DualFemur Total Mean 08/01/2023 80.4 Osteopenia -1.6 0.811 g/cm2 - - ASSESSMENT: The BMD measured at Femur Neck Left is 0.696 g/cm2 with a T-score of -2.5. This patient is considered osteoporotic according to World Health Organization [facility]) criteria. The scan quality is good. L3 and L4 were excluded due to advanced degenerative changes.  Assessment & Plan:   Osteoporosis-likely age-related  2.  Hypothyroidism  - Mary Hart  is being seen at a kind request of Tommie Sams, DO. - I have reviewed her available  records and clinically evaluated the patient. - Based on these reviews, she has osteoporosis status post treatment for 5 years with bisphosphonates.  Based on her most recent bone density showing osteoporosis and the hip, and her reported height loss of 2 to 3 inches over the years, she will benefit from continued treatment for osteoporosis.  There is no more utility left for bisphosphonates, either oral or IV at this time in light of her previous treatment with Fosamax as well as Reclast for 5  years. -She is approached for her next option of treatment with Prolia and she agrees. She does not have any dental work planned.  She will be started on Prolia 60 mg subcutaneously every 6 months.  The importance of avoiding interruption of treatment is discussed with her along with the risk of withdrawal fracture.   She is advised to continue Synthroid 75 mcg p.o. daily before breakfast.   - We discussed about the correct intake of her thyroid hormone, on empty stomach at fasting, with water, separated by at least 30 minutes from breakfast and other medications,  and separated by more than 4 hours from calcium, iron, multivitamins, acid reflux medications (PPIs). -Patient is made aware of the fact that thyroid hormone replacement is needed for life, dose to be adjusted by periodic monitoring of thyroid function tests.   - she is advised to maintain close follow up with Tommie Sams, DO for primary  care needs.   I spent  26  minutes in the care of the patient today including review of labs from Thyroid Function, CMP, and other relevant labs ; imaging/biopsy records (current and previous including abstractions from other facilities); face-to-face time discussing  her lab results and symptoms, medications doses, her options of short and long term treatment based on the latest standards of care / guidelines;   and documenting the encounter.  Early D Borgeson  participated in the discussions, expressed understanding, and voiced agreement with the above plans.  All questions were answered to her satisfaction. she is encouraged to contact clinic should she have any questions or concerns prior to her return visit.  Follow up plan: Return in about 6 months (around 03/14/2024) for Prolia Today (ASAP) and Prolia NV.   Marquis Lunch, MD [facility] [address] [place], [place] [address] Phone: [phone]  Fax: [phone]     09/15/2023, 1:43  PM  This note was partially dictated with voice recognition software. Similar sounding words can be transcribed inadequately or may not  be corrected upon review.

## 2023-09-16 ENCOUNTER — Other Ambulatory Visit: Payer: Self-pay

## 2023-09-16 ENCOUNTER — Other Ambulatory Visit (HOSPITAL_COMMUNITY): Payer: Self-pay | Admitting: Pharmacy Technician

## 2023-09-16 ENCOUNTER — Encounter (HOSPITAL_COMMUNITY): Payer: Self-pay

## 2023-09-16 ENCOUNTER — Other Ambulatory Visit (HOSPITAL_COMMUNITY): Payer: Self-pay

## 2023-09-16 DIAGNOSIS — M810 Age-related osteoporosis without current pathological fracture: Secondary | ICD-10-CM

## 2023-09-16 MED ORDER — PROLIA 60 MG/ML ~~LOC~~ SOSY
60.0000 mg | PREFILLED_SYRINGE | SUBCUTANEOUS | 0 refills | Status: DC
  Filled 2023-09-16 – 2023-11-01 (×6): qty 1, 180d supply, fill #0

## 2023-09-19 ENCOUNTER — Telehealth: Payer: Self-pay | Admitting: "Endocrinology

## 2023-09-19 NOTE — Telephone Encounter (Signed)
Pt called and stated that she wants a call from you regarding her Proila and her lab work. Thank you!

## 2023-09-19 NOTE — Telephone Encounter (Signed)
Pt called stating due to insurance she will need to wait until January to start taking prolia.

## 2023-09-26 ENCOUNTER — Ambulatory Visit (HOSPITAL_COMMUNITY)
Admission: RE | Admit: 2023-09-26 | Discharge: 2023-09-26 | Disposition: A | Discharge status: Home / Self Care | Payer: PPO | Source: Ambulatory Visit | Attending: Acute Care | Admitting: Acute Care

## 2023-09-26 ENCOUNTER — Ambulatory Visit (HOSPITAL_COMMUNITY): Payer: PPO

## 2023-09-26 DIAGNOSIS — Z87891 Personal history of nicotine dependence: Secondary | ICD-10-CM | POA: Insufficient documentation

## 2023-09-26 DIAGNOSIS — J432 Centrilobular emphysema: Secondary | ICD-10-CM | POA: Diagnosis not present

## 2023-09-26 DIAGNOSIS — R918 Other nonspecific abnormal finding of lung field: Secondary | ICD-10-CM | POA: Insufficient documentation

## 2023-09-27 ENCOUNTER — Other Ambulatory Visit: Payer: Self-pay

## 2023-10-04 ENCOUNTER — Ambulatory Visit: Payer: PPO | Admitting: Acute Care

## 2023-10-05 ENCOUNTER — Other Ambulatory Visit: Payer: Self-pay | Admitting: Emergency Medicine

## 2023-10-05 DIAGNOSIS — F17210 Nicotine dependence, cigarettes, uncomplicated: Secondary | ICD-10-CM

## 2023-10-05 DIAGNOSIS — Z122 Encounter for screening for malignant neoplasm of respiratory organs: Secondary | ICD-10-CM

## 2023-10-05 DIAGNOSIS — Z87891 Personal history of nicotine dependence: Secondary | ICD-10-CM

## 2023-10-11 ENCOUNTER — Encounter: Payer: Self-pay | Admitting: Acute Care

## 2023-10-11 ENCOUNTER — Ambulatory Visit: Payer: PPO | Admitting: Acute Care

## 2023-10-11 VITALS — BP 122/74 | HR 70 | Temp 97.8°F | Ht 62.0 in | Wt 152.4 lb

## 2023-10-11 DIAGNOSIS — Z87891 Personal history of nicotine dependence: Secondary | ICD-10-CM | POA: Diagnosis not present

## 2023-10-11 DIAGNOSIS — J449 Chronic obstructive pulmonary disease, unspecified: Secondary | ICD-10-CM | POA: Diagnosis not present

## 2023-10-11 DIAGNOSIS — I2510 Atherosclerotic heart disease of native coronary artery without angina pectoris: Secondary | ICD-10-CM

## 2023-10-11 MED ORDER — FLUTICASONE FUROATE-VILANTEROL 200-25 MCG/ACT IN AEPB: 1.0000 | INHALATION_SPRAY | Freq: Every day | RESPIRATORY_TRACT | 3 refills | Status: DC

## 2023-10-11 NOTE — Patient Instructions (Addendum)
It is good to see you today. Your LDCT has shown improvement , and has normalized.  This is good news We will do your next scan 09/2024.  You will get a call to get this scheduled closer to the time it is due.  I will refer you to cardiology to monitor your heart. Continue your diet changes we discussed to decrease fat intake. I will refer you to cardiology to be followed closely. You will get a call to get this visit scheduled.  Please schedule with Dr. Wynona Neat 06/2024. Please contact office for sooner follow up if symptoms do not improve or worsen or seek emergency care

## 2023-10-11 NOTE — Progress Notes (Signed)
History of Present Illness Mary Hart is a 82 y.o. female former smoker with emphysema, followed through the lung cancer screening program, and by Dr. Wynona Neat.    10/11/2023 Pt. Presents for follow up after low dose screening CT chest. She had an abnormal screening CT Chest in August 2024 . This was a 3 month follow up. The scan shows resolution of the patchy ground glass opacities. She had Covid about the time of the scan, and these were most likely infectious or inflammatory. We will return her to annual screening due 09/2024.She is in agreement with this plan.   There was notation of CAD on her scan . The patient's primary care provider suggested starting statin therapy due to elevated cholesterol levels, but the patient declined, preferring to attempt dietary modifications first. They have made significant changes to their diet, including eliminating sweets, dairy, cheese, and red meat. However, they are not currently taking any supplements, despite having osteoporosis and a dairy-free diet that may limit their intake of calcium and vitamin D. I will refer patient to cardiology today.  The patient is physically active, participating in [place], aerobics, and line dancing. They report no current chest pain or other cardiac symptoms. They also have a history of smoking, but quit approximately seven years ago.   Test Results: 09/26/2023 LDCT Mediastinum/Nodes: No pathologically enlarged mediastinal or axillary lymph nodes. Hilar regions are difficult to definitively evaluate without IV contrast. Esophagus is grossly unremarkable.   Lungs/Pleura: Mild biapical pleural calcification and scarring. Calcified granulomas. Centrilobular emphysema. Smoking related respiratory bronchiolitis. Mild basilar subpleural coarsened ground-glass and subpleural reticular densities bronchiectasis. Interval clearing of previously seen areas of nodular consolidation. Pulmonary nodules otherwise measure  5.9 mm or less in size, as before. No new pulmonary nodules. No pleural fluid. Minimal debris in the airway.  IMPRESSION: 1. Lung-RADS 2, benign appearance or behavior. Continue annual screening with low-dose chest CT without contrast in 12 months. 2. Mild basilar subpleural coarsening ground-glass and subpleural reticular densities with bronchiectasis, suspicious for interstitial lung disease. If further evaluation is desired, high resolution chest CT without contrast could be performed. 3. Aortic atherosclerosis (ICD10-I70.0). Coronary artery calcification. 4.  Emphysema (ICD10-J43.9).  LDCT 06/23/2023 IMPRESSION: 1. New patchy ground-glass opacities and scattered solid pulmonary nodules, likely infectious or inflammatory. Short-term follow-up chest CT is recommended in 3 months to ensure resolution. Lung-RADS 0S, incomplete. Additional lung cancer screening CT images/or comparison to prior chest CT examinations is needed. S modifier for coronary artery calcifications. 2. Severe coronary artery calcifications, recommend ASCVD risk assessment. 3. Aortic Atherosclerosis (ICD10-I70.0).     Latest Ref Rng & Units 05/10/2023    3:55 PM 02/01/2023    3:09 PM 10/28/2022    2:39 PM  CBC  WBC 3.4 - 10.8 x10E3/uL 7.9  9.2  6.6   Hemoglobin 11.1 - 15.9 g/dL 96.2  95.2  84.1   Hematocrit 34.0 - 46.6 % 42.2  43.1  43.4   Platelets 150 - 450 x10E3/uL 303  331  320        Latest Ref Rng & Units 05/10/2023    3:55 PM 02/01/2023    3:09 PM 10/28/2022    2:39 PM  BMP  Glucose 70 - 99 mg/dL 85  72  95   BUN 8 - 27 mg/dL 9  19  12    Creatinine 0.57 - 1.00 mg/dL 3.24  4.01  0.27   BUN/Creat Ratio 12 - 28 11  18     Sodium 134 -  144 mmol/L 139  141  136   Potassium 3.5 - 5.2 mmol/L 5.0  4.9  4.2   Chloride 96 - 106 mmol/L 100  102  103   CO2 20 - 29 mmol/L 25  20  24    Calcium 8.7 - 10.3 mg/dL 9.1  9.4  8.5     BNP No results found for: "BNP"  ProBNP No results found for:  "PROBNP"  PFT    Component Value Date/Time   FEV1PRE 2.02 01/12/2023 1153   FEV1POST 1.95 01/12/2023 1153   FVCPRE 2.38 01/12/2023 1153   FVCPOST 2.21 01/12/2023 1153   TLC 5.20 01/12/2023 1153   DLCOUNC 17.37 01/12/2023 1153   PREFEV1FVCRT 85 01/12/2023 1153   PSTFEV1FVCRT 88 01/12/2023 1153    CT CHEST LCS NODULE F/U LOW DOSE WO CONTRAST Result Date: 10/05/2023 CLINICAL DATA:  Current 20+ pack-year smoker, abnormal lung cancer screening CT. EXAM: CT CHEST WITHOUT CONTRAST FOR LUNG CANCER SCREENING NODULE FOLLOW-UP TECHNIQUE: Multidetector CT imaging of the chest was performed following the standard protocol without IV contrast. RADIATION DOSE REDUCTION: This exam was performed according to the departmental dose-optimization program which includes automated exposure control, adjustment of the mA and/or kV according to patient size and/or use of iterative reconstruction technique. COMPARISON:  06/23/2023. FINDINGS: Cardiovascular: Atherosclerotic calcification of the aorta and coronary arteries. Heart is at the upper limits of normal in size to mildly enlarged. No pericardial effusion. Mediastinum/Nodes: No pathologically enlarged mediastinal or axillary lymph nodes. Hilar regions are difficult to definitively evaluate without IV contrast. Esophagus is grossly unremarkable. Lungs/Pleura: Mild biapical pleural calcification and scarring. Calcified granulomas. Centrilobular emphysema. Smoking related respiratory bronchiolitis. Mild basilar subpleural coarsened ground-glass and subpleural reticular densities bronchiectasis. Interval clearing of previously seen areas of nodular consolidation. Pulmonary nodules otherwise measure 5.9 mm or less in size, as before. No new pulmonary nodules. No pleural fluid. Minimal debris in the airway. Upper Abdomen: Visualized portions of the liver, adrenal glands, kidneys, spleen, pancreas, stomach and bowel are grossly unremarkable. No upper abdominal adenopathy.  Musculoskeletal: Degenerative changes in the spine. IMPRESSION: 1. Lung-RADS 2, benign appearance or behavior. Continue annual screening with low-dose chest CT without contrast in 12 months. 2. Mild basilar subpleural coarsening ground-glass and subpleural reticular densities with bronchiectasis, suspicious for interstitial lung disease. If further evaluation is desired, high resolution chest CT without contrast could be performed. 3. Aortic atherosclerosis (ICD10-I70.0). Coronary artery calcification. 4.  Emphysema (ICD10-J43.9). Electronically Signed   By: Leanna Battles M.D.   On: 10/05/2023 10:12     Past medical hx Past Medical History:  Diagnosis Date   Anxiety    Arthritis    oa   Clostridium difficile infection 2013, 2015   Treated with vancomycin   COPD (chronic obstructive pulmonary disease) (HCC)    minimal shortness of breath   Depression    Diverticula of colon    Genital herpes    GERD (gastroesophageal reflux disease)    History of total knee replacement, right 03/28/2019   Hypothyroid    IBS (irritable bowel syndrome)    Internal hemorrhoids    Jaundice    as child cause unknown   Neuropathy    fingers both hands   PONV (postoperative nausea and vomiting)    did well with last neck fusion    Recurrent colitis due to Clostridium difficile 07/03/2013   4 treatments with vancomycin since late 2013/early 2014 - responds then relapses     Vocal cord dysfunction    SYASMONIC  DYSPHONIA     Social History   Tobacco Use   Smoking status: Former    Current packs/day: 0.00    Average packs/day: 0.7 packs/day for 50.0 years (33.0 ttl pk-yrs)    Types: Cigarettes    Start date: 19    Quit date: 2017    Years since quitting: 7.9   Smokeless tobacco: Never   Tobacco comments:    History of 1/2 pack per day  Vaping Use   Vaping status: Former  Substance Use Topics   Alcohol use: Yes    Comment: occasional glass of wine   Drug use: No    Ms.Bolyard reports that  she quit smoking about 7 years ago. Her smoking use included cigarettes. She started smoking about 58 years ago. She has a 33 pack-year smoking history. She has never used smokeless tobacco. She reports current alcohol use. She reports that she does not use drugs.  Tobacco Cessation: Former smoker , Quit 2017 with a 33 pack year smoking history   Past surgical hx, Family hx, Social hx all reviewed.  Current Outpatient Medications on File Prior to Visit  Medication Sig   ALPRAZolam (XANAX) 0.25 MG tablet Take 1 tablet by mouth daily as needed for anxiety.   Aspirin-Salicylamide-Caffeine (ARTHRITIS STRENGTH BC POWDER PO) Take by mouth. One daily prn   cetirizine (ZYRTEC) 10 MG tablet Take 10 mg by mouth at bedtime.   cholecalciferol (VITAMIN D3) 25 MCG (1000 UNIT) tablet Take 1,000 Units by mouth daily.   denosumab (PROLIA) 60 MG/ML SOSY injection Inject 60 mg into the skin every 6 (six) months.   fluticasone (FLONASE) 50 MCG/ACT nasal spray Place into both nostrils daily.   levothyroxine (SYNTHROID) 75 MCG tablet Take 1 tablet (75 mcg total) by mouth daily before breakfast.   Magnesium 200 MG CHEW Chew 2 tablets by mouth daily.   methylcellulose oral powder Take by mouth daily.   polyethylene glycol powder (GLYCOLAX/MIRALAX) 17 GM/SCOOP powder Take 1 Container by mouth once.   albuterol (VENTOLIN HFA) 108 (90 Base) MCG/ACT inhaler Inhale 1-2 puffs into the lungs every 6 (six) hours as needed for wheezing or shortness of breath. (Patient not taking: Reported on 10/11/2023)   No current facility-administered medications on file prior to visit.     Allergies  Allergen Reactions   Oxycodone Other (See Comments) and Itching   Codeine Nausea And Vomiting   Hydrocodone    Hydromorphone Hcl Itching   Levonorgestrel-Ethinyl Estrad Other (See Comments)   Morphine Nausea And Vomiting   Moxifloxacin Other (See Comments)    N/V/D, skin red. Tolerates Levaquin and Cipro.   Sulfonamide Derivatives  Other (See Comments)    "almost died" "pulse went down to 12"    Review Of Systems:  Constitutional:   No  weight loss, night sweats,  Fevers, chills, fatigue, or  lassitude.  HEENT:   No headaches,  Difficulty swallowing,  Tooth/dental problems, or  Sore throat,                No sneezing, itching, ear ache, nasal congestion, post nasal drip,   CV:  No chest pain,  Orthopnea, PND, swelling in lower extremities, anasarca, dizziness, palpitations, syncope.   GI  No heartburn, indigestion, abdominal pain, nausea, vomiting, diarrhea, change in bowel habits, loss of appetite, bloody stools.   Resp: No shortness of breath with exertion or at rest.  No excess mucus, no productive cough,  No non-productive cough,  No coughing up of blood.  No  change in color of mucus.  No wheezing.  No chest wall deformity  Skin: no rash or lesions.  GU: no dysuria, change in color of urine, no urgency or frequency.  No flank pain, no hematuria   MS:  No joint pain or swelling.  No decreased range of motion.  No back pain.  Psych:  No change in mood or affect. No depression or anxiety.  No memory loss.   Vital Signs BP 122/74   Pulse 70   Temp 97.8 F (36.6 C)   Ht 5\' 2"  (1.575 m)   Wt 152 lb 6.4 oz (69.1 kg)   SpO2 99%   BMI 27.87 kg/m    Physical Exam:  General- No distress,  A&Ox3, pleasant ENT: No sinus tenderness, TM clear, pale nasal mucosa, no oral exudate,no post nasal drip, no LAN Cardiac: S1, S2, regular rate and rhythm, no murmur Chest: No wheeze/ rales/ dullness; no accessory muscle use, no nasal flaring, no sternal retractions Abd.: Soft Non-tender, ND, BS +, Body mass index is 27.87 kg/m.  Ext: No clubbing cyanosis, edema Neuro:  normal strength, MAE x 4, A&O x 3 Skin: No rashes, warm and dry, No lesions  Psych: normal mood and behavior   Assessment/Plan Lung Nodule COPD/ Emphysema Nodule measures 5.8mm or less with clearing of previously noted areas. Patient is currently  using Breo inhaler with good effect. -Continue Breo inhaler, one puff once a day. -Order 90-day supply of Breo with three refills. -Schedule follow-up lung cancer screening for December 2025. - You will get a call to get this scheduled closer to the time  Aortic Atherosclerosis and Coronary Artery Calcification Patient is not currently on statins due to concerns about side effects. Patient has made dietary changes and is physically active. No current symptoms of chest pain. -Refer to cardiology for further evaluation and management options. -Consider multivitamin with calcium and vitamin D due to dietary restrictions and history of osteoporosis..  Osteoporosis Patient is unable to take calcium supplements due to constipation. -Advise patient to take a multivitamin with calcium and vitamin D to prevent further bone loss.  Follow-up Patient to see Dr. Antony Salmon in September 2025. Patient to schedule appointment at front desk.  I spent 20 minutes dedicated to the care of this patient on the date of this encounter to include pre-visit review of records, face-to-face time with the patient discussing conditions above, post visit ordering of testing, clinical documentation with the electronic health record, making appropriate referrals as documented, and communicating necessary information to the patient's healthcare team.     Bevelyn Ngo, NP 10/11/2023  11:45 AM

## 2023-10-24 DIAGNOSIS — H353133 Nonexudative age-related macular degeneration, bilateral, advanced atrophic without subfoveal involvement: Secondary | ICD-10-CM | POA: Diagnosis not present

## 2023-10-27 ENCOUNTER — Other Ambulatory Visit: Payer: Self-pay | Admitting: Nurse Practitioner

## 2023-10-28 ENCOUNTER — Other Ambulatory Visit (HOSPITAL_COMMUNITY): Payer: Self-pay

## 2023-11-01 ENCOUNTER — Other Ambulatory Visit: Payer: Self-pay

## 2023-11-15 NOTE — Telephone Encounter (Signed)
Pt called back regarding Prolia stating is she does not want to start treatment due to cost of medication. States she also wants to try more "natural", homeopathic treatment through diet and exercise.

## 2023-11-16 NOTE — Telephone Encounter (Signed)
Spoke with pt advising her to keep her upcoming appointment to continue monitoring her bone density as well as discuss other treatment options per Dr.Nida. Pt voiced understanding.

## 2023-11-21 DIAGNOSIS — Z1283 Encounter for screening for malignant neoplasm of skin: Secondary | ICD-10-CM | POA: Diagnosis not present

## 2023-11-21 DIAGNOSIS — D225 Melanocytic nevi of trunk: Secondary | ICD-10-CM | POA: Diagnosis not present

## 2023-12-06 ENCOUNTER — Ambulatory Visit: Payer: PPO | Admitting: "Endocrinology

## 2023-12-13 ENCOUNTER — Ambulatory Visit: Payer: PPO | Attending: Internal Medicine | Admitting: Internal Medicine

## 2023-12-13 ENCOUNTER — Encounter: Payer: Self-pay | Admitting: Internal Medicine

## 2023-12-13 VITALS — BP 116/78 | HR 81 | Ht 62.0 in | Wt 147.2 lb

## 2023-12-13 DIAGNOSIS — I2510 Atherosclerotic heart disease of native coronary artery without angina pectoris: Secondary | ICD-10-CM | POA: Insufficient documentation

## 2023-12-13 DIAGNOSIS — Z136 Encounter for screening for cardiovascular disorders: Secondary | ICD-10-CM

## 2023-12-13 NOTE — Progress Notes (Signed)
Cardiology Office Note  Date: 12/13/2023   ID: LAYTOYA ION, DOB 10-14-1941, MRN [id number]  PCP:  Tommie Sams, DO  Cardiologist:  Marjo Bicker, MD Electrophysiologist:  None   History of Present Illness: Mary Hart is a 83 y.o. female known to have hypothyroidism was referred to cardiology clinic for imaging evidence of severe coronary artery calcifications per August 2024 CT chest lung cancer screening imaging study.  She is physically active at baseline.  She does aerobic exercise 1 hour on most of the days every week.  She denies have any symptoms of angina.  She has baseline COPD and has some shortness of breath but she is still able to perform her aerobic exercise without having any symptoms of DOE.  No dizziness, presyncope, syncope.  PCP tried to start statin but patient refused as she wanted to start diet.  I reviewed her lipid panels that was normal 10 months ago but abnormal around 5 months ago.  She quit smoking 6 years ago but resumed around a few months ago.  She said she gained a lot of weight after she quit smoking and hence had to resume smoking to lose all that weight.  Past Medical History:  Diagnosis Date   Anxiety    Arthritis    oa   Clostridium difficile infection 2013, 2015   Treated with vancomycin   COPD (chronic obstructive pulmonary disease) (HCC)    minimal shortness of breath   Depression    Diverticula of colon    Genital herpes    GERD (gastroesophageal reflux disease)    History of total knee replacement, right 03/28/2019   Hypothyroid    IBS (irritable bowel syndrome)    Internal hemorrhoids    Jaundice    as child cause unknown   Neuropathy    fingers both hands   PONV (postoperative nausea and vomiting)    did well with last neck fusion    Recurrent colitis due to Clostridium difficile 07/03/2013   4 treatments with vancomycin since late 2013/early 2014 - responds then relapses     Vocal cord dysfunction    SYASMONIC  DYSPHONIA    Past Surgical History:  Procedure Laterality Date   ABDOMINAL HYSTERECTOMY     Anterior Cervical Fusion X 2     BREAST BIOPSY Bilateral    benign   CHOLECYSTECTOMY     COLONOSCOPY  06/20/2008   [facility] Dr. Damita Dunnings external hemorrhoids   COLONOSCOPY  10/12/2012   Dr. Jena Gauss- internal hemorrhoids, very early, few, sigmoid diverticula o/w normal appearing colon and terminal ileum. + cdiff on stool samples taken. Segmental bx negative.   ESOPHAGOGASTRODUODENOSCOPY  11/25/2010   Rourk- tubular esophagus s/p of 56 dilator from a small HH/otherwise normal   EYE SURGERY Bilateral 2012   ioc with lens replacement for cataracts   JOINT REPLACEMENT  09/2010   Thumb left   meniscus repair right knee  2014   NASAL SEPTOPLASTY W/ TURBINOPLASTY Bilateral 12/07/2021   Procedure: NASAL SEPTOPLASTY WITH TURBINATE REDUCTION;  Surgeon: Newman Pies, MD;  Location: [facility];  Service: ENT;  Laterality: Bilateral;   TOTAL KNEE ARTHROPLASTY Right 12/27/2016   Procedure: RIGHT TOTAL KNEE ARTHROPLASTY;  Surgeon: Ollen Gross, MD;  Location: [facility];  Service: Orthopedics;  Laterality: Right;  Adductor Block   VESICOVAGINAL FISTULA CLOSURE W/ TAH      Current Outpatient Medications  Medication Sig Dispense Refill   albuterol (VENTOLIN HFA) 108 (90 Base) MCG/ACT  inhaler Inhale 1-2 puffs into the lungs every 6 (six) hours as needed for wheezing or shortness of breath.     ALPRAZolam (XANAX) 0.25 MG tablet Take 1 tablet by mouth daily as needed for anxiety. 20 tablet 0   Aspirin-Salicylamide-Caffeine (ARTHRITIS STRENGTH BC POWDER PO) Take by mouth. One daily prn     cetirizine (ZYRTEC) 10 MG tablet Take 10 mg by mouth at bedtime.     cholecalciferol (VITAMIN D3) 25 MCG (1000 UNIT) tablet Take 1,000 Units by mouth daily.     fluticasone (FLONASE) 50 MCG/ACT nasal spray Place into both nostrils daily.     fluticasone furoate-vilanterol (BREO ELLIPTA) 200-25 MCG/ACT AEPB Inhale 1 puff  into the lungs daily. 90 each 3   levothyroxine (SYNTHROID) 75 MCG tablet TAKE 1 TABLET BY MOUTH ONCE DAILY BEFORE BREAKFAST 90 tablet 0   Magnesium 200 MG CHEW Chew 2 tablets by mouth daily.     methylcellulose oral powder Take by mouth daily.     polyethylene glycol powder (GLYCOLAX/MIRALAX) 17 GM/SCOOP powder Take 1 Container by mouth once.     No current facility-administered medications for this visit.   Allergies:  Oxycodone, Codeine, Hydrocodone, Hydromorphone hcl, Levonorgestrel-ethinyl estrad, Morphine, Moxifloxacin, and Sulfonamide derivatives   Social History: The patient  reports that she quit smoking about 8 years ago. Her smoking use included cigarettes. She started smoking about 58 years ago. She has a 33 pack-year smoking history. She has never used smokeless tobacco. She reports current alcohol use. She reports that she does not use drugs.   Family History: The patient's family history includes Breast cancer in her cousin and mother; Colon cancer in her maternal uncle; Dementia in her father and mother; Esophageal cancer in her brother.   ROS:  Please see the history of present illness. Otherwise, complete review of systems is positive for none  All other systems are reviewed and negative.   Physical Exam: VS:  BP 116/78   Pulse 81   Ht 5\' 2"  (1.575 m)   Wt 147 lb 3.2 oz (66.8 kg)   SpO2 98%   BMI 26.92 kg/m , BMI Body mass index is 26.92 kg/m.  Wt Readings from Last 3 Encounters:  12/13/23 147 lb 3.2 oz (66.8 kg)  10/11/23 152 lb 6.4 oz (69.1 kg)  09/15/23 157 lb (71.2 kg)    General: Patient appears comfortable at rest. HEENT: Conjunctiva and lids normal, oropharynx clear with moist mucosa. Neck: Supple, no elevated JVP or carotid bruits, no thyromegaly. Lungs: Clear to auscultation, nonlabored breathing at rest. Cardiac: Regular rate and rhythm, no S3 or significant systolic murmur, no pericardial rub. Abdomen: Soft, nontender, no hepatomegaly, bowel sounds  present, no guarding or rebound. Extremities: No pitting edema, distal pulses 2+. Skin: Warm and dry. Musculoskeletal: No kyphosis. Neuropsychiatric: Alert and oriented x3, affect grossly appropriate.  Recent Labwork: 02/01/2023: TSH 0.920 05/10/2023: ALT 22; AST 21; BUN 9; Creatinine, Ser 0.83; Hemoglobin 13.9; Platelets 303; Potassium 5.0; Sodium 139     Component Value Date/Time   CHOL 208 (H) 07/15/2023 1107   TRIG 100 07/15/2023 1107   HDL 78 07/15/2023 1107   CHOLHDL 2.7 07/15/2023 1107   LDLCALC 113 (H) 07/15/2023 1107     Assessment and Plan:  Severe coronary artery calcifications Hyperlipidemia, not at goal    -Does not have any symptoms of angina.  She does have SOB due to baseline COPD but does not have any DOE as she performs aerobic exercise for 1 hour almost  daily with no symptoms of DOE.  She is extremely physically active at baseline.  Ideally she is supposed to be on statin therapy.  I reviewed her lipid panels from 10 months ago that was normal and from 5 months ago that was abnormal.  She improved her diet but she resumed smoking.  Strongly encourage smoking cessation.  She will need to repeat lipid panel and if LDL is more than 70, can start rosuvastatin 5 mg 3 times a week as she is worried about the side effects.  Patient agreed to this plan.  I discussed symptoms of CAD and MI and she is instructed to return back to the clinic to make an appointment if she develops the symptoms.  ER precautions for MI provided.  I would not recommend low-dose aspirin therapy.  No benefit of low-dose aspirin therapy for primary prevention of CAD after 83 years of age.       Medication Adjustments/Labs and Tests Ordered: Current medicines are reviewed at length with the patient today.  Concerns regarding medicines are outlined above.    Disposition:  Follow up prn  Signed Mary Ell Verne Spurr, MD, 12/13/2023 3:35 PM    [facility] at [facility] [address] [place], [place], [place] [address]

## 2023-12-13 NOTE — Patient Instructions (Signed)
 Medication Instructions:  Your physician recommends that you continue on your current medications as directed. Please refer to the Current Medication list given to you today.   Labwork: None  Testing/Procedures: None  Follow-Up: Your physician recommends that you schedule a follow-up appointment in: As needed  Any Other Special Instructions Will Be Listed Below (If Applicable). Thank you for choosing [facility]!      If you need a refill on your cardiac medications before your next appointment, please call your pharmacy.

## 2023-12-19 DIAGNOSIS — M810 Age-related osteoporosis without current pathological fracture: Secondary | ICD-10-CM | POA: Diagnosis not present

## 2023-12-19 DIAGNOSIS — E039 Hypothyroidism, unspecified: Secondary | ICD-10-CM | POA: Diagnosis not present

## 2023-12-20 LAB — COMPREHENSIVE METABOLIC PANEL
ALT: 11 [IU]/L (ref 0–32)
AST: 18 [IU]/L (ref 0–40)
Albumin: 4 g/dL (ref 3.8–4.8)
Alkaline Phosphatase: 99 [IU]/L (ref 44–121)
BUN/Creatinine Ratio: 12 (ref 12–28)
BUN: 10 mg/dL (ref 8–27)
Bilirubin Total: 0.4 mg/dL (ref 0.0–1.2)
CO2: 22 mmol/L (ref 20–29)
Calcium: 8.8 mg/dL (ref 8.7–10.3)
Chloride: 104 mmol/L (ref 96–106)
Creatinine, Ser: 0.82 mg/dL (ref 0.57–1.00)
Globulin, Total: 1.7 g/dL (ref 1.5–4.5)
Glucose: 78 mg/dL (ref 70–99)
Potassium: 4 mmol/L (ref 3.5–5.2)
Sodium: 140 mmol/L (ref 134–144)
Total Protein: 5.7 g/dL — ABNORMAL LOW (ref 6.0–8.5)
eGFR: 72 mL/min/{1.73_m2} (ref >59)

## 2023-12-20 LAB — LIPID PANEL
Chol/HDL Ratio: 2.2 {ratio} (ref 0.0–4.4)
Cholesterol, Total: 161 mg/dL (ref 100–199)
HDL: 73 mg/dL (ref >39)
LDL Chol Calc (NIH): 73 mg/dL (ref 0–99)
Triglycerides: 81 mg/dL (ref 0–149)
VLDL Cholesterol Cal: 15 mg/dL (ref 5–40)

## 2023-12-20 LAB — VITAMIN D 25 HYDROXY (VIT D DEFICIENCY, FRACTURES): Vit D, 25-Hydroxy: 102 ng/mL — ABNORMAL HIGH (ref 30.0–100.0)

## 2023-12-20 LAB — T4, FREE: Free T4: 1.41 ng/dL (ref 0.82–1.77)

## 2023-12-20 LAB — TSH: TSH: 1.32 u[IU]/mL (ref 0.450–4.500)

## 2023-12-28 ENCOUNTER — Ambulatory Visit: Payer: PPO | Admitting: Family Medicine

## 2024-01-19 ENCOUNTER — Encounter: Payer: Self-pay | Admitting: Family Medicine

## 2024-01-19 ENCOUNTER — Ambulatory Visit (INDEPENDENT_AMBULATORY_CARE_PROVIDER_SITE_OTHER): Admitting: Family Medicine

## 2024-01-19 VITALS — BP 129/84 | HR 88 | Temp 98.1°F | Ht 62.0 in | Wt 144.0 lb

## 2024-01-19 DIAGNOSIS — M810 Age-related osteoporosis without current pathological fracture: Secondary | ICD-10-CM | POA: Diagnosis not present

## 2024-01-19 DIAGNOSIS — J449 Chronic obstructive pulmonary disease, unspecified: Secondary | ICD-10-CM

## 2024-01-19 DIAGNOSIS — N9489 Other specified conditions associated with female genital organs and menstrual cycle: Secondary | ICD-10-CM

## 2024-01-19 DIAGNOSIS — E785 Hyperlipidemia, unspecified: Secondary | ICD-10-CM | POA: Diagnosis not present

## 2024-01-19 DIAGNOSIS — M5412 Radiculopathy, cervical region: Secondary | ICD-10-CM | POA: Diagnosis not present

## 2024-01-19 MED ORDER — BACLOFEN 10 MG PO TABS: 5.0000 mg | ORAL_TABLET | Freq: Three times a day (TID) | ORAL | 0 refills | Status: DC | PRN

## 2024-01-19 MED ORDER — PREDNISONE 10 MG PO TABS: ORAL_TABLET | ORAL | 0 refills | Status: DC

## 2024-01-19 NOTE — Patient Instructions (Signed)
Medication as directed.  Follow up in 6 months.

## 2024-01-20 DIAGNOSIS — E785 Hyperlipidemia, unspecified: Secondary | ICD-10-CM | POA: Insufficient documentation

## 2024-01-20 DIAGNOSIS — M545 Low back pain, unspecified: Secondary | ICD-10-CM | POA: Insufficient documentation

## 2024-01-20 DIAGNOSIS — M5412 Radiculopathy, cervical region: Secondary | ICD-10-CM | POA: Insufficient documentation

## 2024-01-20 NOTE — Assessment & Plan Note (Addendum)
 Stable. Patient not going not going to start Prolia. will continue to monitor closely.  Continue healthy diet and regular exercise.

## 2024-01-20 NOTE — Progress Notes (Signed)
 Subjective:  Patient ID: Mary Hart, female    DOB: Jun 13, 1941  Age: 83 y.o. MRN: [id number]  CC:   Chief Complaint  Patient presents with   6 month follow up     Pt states lost weight and is very active but started back smoking cigarettes    HPI:  83 year old female presents for follow-up.  Patient has recently started smoking again.  She did this to help promote weight loss.  She is not interested in cessation.  Patient reports that she is not proceeding with Prolia for her osteoporosis.  She is focusing on weightbearing activities and healthy diet.  She states that she is concerned about the cost and the fact that she was told that she would have to be on this indefinitely.  Patient reports recent neck pain.  She has a history of cervical spine fusion.  She states that she has had some radicular symptoms to the left arm.  Recently seen by cardiology given coronary artery calcification.  Will review visit and recent labs.  Patient Active Problem List   Diagnosis Date Noted   Hyperlipidemia 01/20/2024   Cervical radiculopathy 01/20/2024   Calcification of coronary artery 12/13/2023   Age-related osteoporosis without current pathological fracture 08/16/2023   Anxiety 07/01/2023   History of hysterectomy 04/07/2023   Diverticulosis 04/07/2023   COPD (chronic obstructive pulmonary disease) (HCC) 02/02/2023   Adnexal mass 02/02/2023   Hypothyroidism 02/02/2023   Aortic atherosclerosis (HCC) 02/01/2023   OA (osteoarthritis) of knee 12/27/2016   Constipation 05/04/2012   Irritable bowel syndrome 05/04/2012   GERD (gastroesophageal reflux disease) 02/14/2012    Social Hx   Social History   Socioeconomic History   Marital status: Widowed    Spouse name: Not on file   Number of children: 4   Years of education: Not on file   Highest education level: Some college, no degree  Occupational History   Occupation: Retired    Associate Professor: RETIRED  Tobacco Use   Smoking  status: Former    Current packs/day: 0.00    Average packs/day: 0.7 packs/day for 50.0 years (33.0 ttl pk-yrs)    Types: Cigarettes    Start date: 33    Quit date: 2017    Years since quitting: 8.2   Smokeless tobacco: Never   Tobacco comments:    Currently doing 1/2 pack per day   Vaping Use   Vaping status: Former  Substance and Sexual Activity   Alcohol use: Yes    Comment: occasional glass of wine   Drug use: No   Sexual activity: Not Currently  Other Topics Concern   Not on file  Social History Narrative   Married, 2 sons and 2 daughters   Husband chronically ill and she helps with care   She is retired   Teacher, 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 Risk  (01/16/2024)   Overall Financial Resource Strain (CARDIA)    Difficulty of Paying Living Expenses: Not very hard  Food Insecurity: No Food Insecurity (01/16/2024)   Hunger Vital Sign    Worried About Running Out of Food in the Last Year: Never true    Ran Out of Food in the Last Year: Never true  Transportation Needs: No Transportation Needs (01/16/2024)   PRAPARE - Administrator, Civil Service (Medical): No    Lack of Transportation (Non-Medical): No  Physical Activity: Sufficiently Active (01/16/2024)   Exercise Vital Sign    Days of Exercise  per Week: 3 days    Minutes of Exercise per Session: 50 min  Stress: No Stress Concern Present (01/16/2024)   [employer] of Occupational Health - Occupational Stress Questionnaire    Feeling of Stress : Not at all  Social Connections: Moderately Integrated (01/16/2024)   Social Connection and Isolation Panel [NHANES]    Frequency of Communication with Friends and Family: More than three times a week    Frequency of Social Gatherings with Friends and Family: Twice a week    Attends Religious Services: More than 4 times per year    Active Member of [employer] or Organizations: Yes    Attends Banker Meetings: More than 4 times per year     Marital Status: Widowed    Review of Systems Per HPI  Objective:  BP 129/84   Pulse 88   Temp 98.1 F (36.7 C)   Ht 5\' 2"  (1.575 m)   Wt 144 lb (65.3 kg)   SpO2 98%   BMI 26.34 kg/m      01/19/2024    2:37 PM 12/13/2023    3:12 PM 10/11/2023   10:29 AM  BP/Weight  Systolic BP 129 116 122  Diastolic BP 84 78 74  Wt. (Lbs) 144 147.2 152.4  BMI 26.34 kg/m2 26.92 kg/m2 27.87 kg/m2    Physical Exam Vitals and nursing note reviewed.  Constitutional:      General: She is not in acute distress.    Appearance: Normal appearance.  HENT:     Head: Normocephalic and atraumatic.  Cardiovascular:     Rate and Rhythm: Normal rate and regular rhythm.  Pulmonary:     Effort: Pulmonary effort is normal.     Breath sounds: Normal breath sounds. No wheezing, rhonchi or rales.  Neurological:     Mental Status: She is alert.  Psychiatric:        Mood and Affect: Mood normal.        Behavior: Behavior normal.     Lab Results  Component Value Date   WBC 7.9 05/10/2023   HGB 13.9 05/10/2023   HCT 42.2 05/10/2023   PLT 303 05/10/2023   GLUCOSE 78 12/19/2023   CHOL 161 12/19/2023   TRIG 81 12/19/2023   HDL 73 12/19/2023   LDLCALC 73 12/19/2023   ALT 11 12/19/2023   AST 18 12/19/2023   NA 140 12/19/2023   K 4.0 12/19/2023   CL 104 12/19/2023   CREATININE 0.82 12/19/2023   BUN 10 12/19/2023   CO2 22 12/19/2023   TSH 1.320 12/19/2023   INR 0.93 12/21/2016     Assessment & Plan:  Chronic obstructive pulmonary disease, unspecified COPD type (HCC) Assessment & Plan: Recommended smoking cessation.  She is not interested at this time.  Has had recent lung cancer screening.   Hyperlipidemia, unspecified hyperlipidemia type Assessment & Plan: Lipids fairly well-controlled.  Most recent LDL 73.  She does not want statin therapy at this time.  Continue dietary and lifestyle changes.  Close monitoring.   Age-related osteoporosis without current pathological  fracture Assessment & Plan: Stable. Patient not going not going to start Prolia. will continue to monitor closely.  Continue healthy diet and regular exercise.   Cervical radiculopathy Assessment & Plan: Treating with prednisone and baclofen.   Other orders -     predniSONE; 50 mg daily x 2 days, then 40 mg daily x 2 days, then 30 mg daily x 2 days, then 20 mg daily x 2 days,  then 10 mg daily x 2 days.  Dispense: 30 tablet; Refill: 0 -     Baclofen; Take 0.5-1 tablets (5-10 mg total) by mouth 3 (three) times daily as needed for muscle spasms.  Dispense: 30 each; Refill: 0    Follow-up:  6 months  Lulu Hirschmann Adriana Simas DO [facility]

## 2024-01-20 NOTE — Assessment & Plan Note (Signed)
Treating with prednisone and baclofen.

## 2024-01-20 NOTE — Assessment & Plan Note (Addendum)
Treating with prednisone and baclofen.

## 2024-01-20 NOTE — Assessment & Plan Note (Addendum)
 Recommended smoking cessation.  She is not interested at this time.  Has had recent lung cancer screening.

## 2024-01-20 NOTE — Assessment & Plan Note (Signed)
 Lipids fairly well-controlled.  Most recent LDL 73.  She does not want statin therapy at this time.  Continue dietary and lifestyle changes.  Close monitoring.

## 2024-01-24 ENCOUNTER — Other Ambulatory Visit: Payer: Self-pay | Admitting: Family Medicine

## 2024-01-24 ENCOUNTER — Telehealth: Payer: Self-pay

## 2024-01-24 NOTE — Telephone Encounter (Signed)
--   pt had an ov on 01/19/24 and had a recent rx for prednisone for copd  - will also need a new rx for a herpes outbreak, please advise  Copied from CRM [id number]. Topic: Clinical - Medication Question >> Jan 24, 2024 10:55 AM Victorino Dike T wrote: Reason for CRM: predniSONE (DELTASONE) 10 MG tablet is keeping her awake and now have a herpes outbreak and need valacyclovir 1GM- please call [phone] Started taking an old prescription of the valacyclovir from 06/03/2022

## 2024-01-26 ENCOUNTER — Other Ambulatory Visit: Payer: Self-pay | Admitting: Family Medicine

## 2024-01-26 ENCOUNTER — Telehealth: Payer: Self-pay | Admitting: *Deleted

## 2024-01-26 MED ORDER — VALACYCLOVIR HCL 1 G PO TABS: 1000.0000 mg | ORAL_TABLET | Freq: Three times a day (TID) | ORAL | 0 refills | Status: DC

## 2024-01-26 NOTE — Telephone Encounter (Signed)
 Patient notified and verbalized understanding.

## 2024-01-26 NOTE — Telephone Encounter (Signed)
 Mary Sams, DO     Rx sent.

## 2024-01-26 NOTE — Telephone Encounter (Signed)
 Copied from CRM [id number]. Topic: Clinical - Medical Advice >> Jan 26, 2024  9:22 AM Carlatta H wrote: Reason for CRM: Dr Adriana Simas wanted to know where the outbreak is on the patient//The patient is stating that its under her skin and patient is very uncomfortable//Please advised//Please have the nurse give patient a call once a decision//

## 2024-02-01 ENCOUNTER — Other Ambulatory Visit: Payer: Self-pay | Admitting: Nurse Practitioner

## 2024-02-08 DIAGNOSIS — H43813 Vitreous degeneration, bilateral: Secondary | ICD-10-CM | POA: Diagnosis not present

## 2024-02-08 DIAGNOSIS — H353133 Nonexudative age-related macular degeneration, bilateral, advanced atrophic without subfoveal involvement: Secondary | ICD-10-CM | POA: Diagnosis not present

## 2024-02-08 DIAGNOSIS — H35033 Hypertensive retinopathy, bilateral: Secondary | ICD-10-CM | POA: Diagnosis not present

## 2024-02-08 DIAGNOSIS — H26491 Other secondary cataract, right eye: Secondary | ICD-10-CM | POA: Diagnosis not present

## 2024-02-13 ENCOUNTER — Other Ambulatory Visit: Payer: Self-pay | Admitting: Family Medicine

## 2024-02-13 NOTE — Telephone Encounter (Signed)
 Copied from CRM [id number]. Topic: Clinical - Medication Refill >> Feb 13, 2024 10:47 AM Mary Hart wrote: Most Recent Primary Care Visit:  Provider: Myrna Ast  Department: [facility]  Visit Type: OFFICE VISIT  Date: 01/19/2024  Medication: albuterol  (VENTOLIN  HFA) 108 (90 Base) MCG/ACT inhaler  Has the patient contacted their pharmacy? No Pt starting seeing Mary Artis, MD and he has never filled this.  Pt states her primary dr was doing for her, but now she would like Dr Mary Hart to take this over  Is this the correct pharmacy for this prescription? Yes If no, delete pharmacy and type the correct one.  This is the patient's preferred pharmacy:  [facility] [address] [place] [address] [place] [address] [place] [address] Phone: [phone] Fax: [phone]  [facility] [address] [place] [address] Phone: [phone] Fax: [phone]   Has the prescription been filled recently? No  Is the patient out of the medication? Yes  Has the patient been seen for an appointment in the last year OR does the patient have an upcoming appointment? Yes  Can we respond through MyChart? No  Agent: Please be advised that Rx refills may take up to 3 business days. We ask that you follow-up with your pharmacy.

## 2024-02-16 ENCOUNTER — Other Ambulatory Visit: Payer: Self-pay | Admitting: Family Medicine

## 2024-02-16 MED ORDER — ALBUTEROL SULFATE HFA 108 (90 BASE) MCG/ACT IN AERS: 1.0000 | INHALATION_SPRAY | Freq: Four times a day (QID) | RESPIRATORY_TRACT | 3 refills | Status: AC | PRN

## 2024-02-16 NOTE — Telephone Encounter (Signed)
 Copied from CRM [id number]. Topic: Clinical - Medication Question >> Feb 16, 2024 11:26 AM Mary Hart wrote: Reason for CRM: Patient would like to know when albuterol  (VENTOLIN  HFA) 108 (90 Base) MCG/ACT inhaler  will be prescribed, states this is her rescue inhaler.  Pt does not get her Albuterol  RX from this office.

## 2024-02-20 ENCOUNTER — Other Ambulatory Visit: Payer: Self-pay | Admitting: Family Medicine

## 2024-02-27 DIAGNOSIS — M1811 Unilateral primary osteoarthritis of first carpometacarpal joint, right hand: Secondary | ICD-10-CM | POA: Diagnosis not present

## 2024-03-07 ENCOUNTER — Ambulatory Visit: Payer: Self-pay

## 2024-03-07 NOTE — Telephone Encounter (Signed)
 Appointment scheduled.

## 2024-03-07 NOTE — Telephone Encounter (Signed)
  Chief Complaint: Cough Symptoms: cough, congestion, chest tight, legs tense Frequency: Constant Pertinent Negatives: Patient denies all other symptoms Disposition: [] ED /[] Urgent Care (no appt availability in office) / [x] Appointment(In office/virtual)/ []  [place] [place] [facility]/ [] Home Care/ [] Refused Recommended Disposition /[] [facility]/ []  Follow-up with PCP Additional Notes:  Cough started one week ago. Now having coughing with phlegm, chest tightness while coughing. Her legs feel tense as well.  Alkaseltzer cold has been helpful, she though she had been improving but today her cough is worsening, sputum is dark yellow. History of COPD-also has some shortness of breath, it is not any worse than her baseline. Acute evaluation advised, she declines visit that is available today as she has a church commitment she does not want to cancel. Scheduled with PCP 03/08/24. Educated on care advice as documented in protocol, patient verbalized understanding. Discussed reasons to call back.       Reason for Disposition  [1] Continuous (nonstop) coughing interferes with work or school AND [2] no improvement using cough treatment per Care Advice  Protocols used: Cough - Acute Productive-A-AH

## 2024-03-08 ENCOUNTER — Encounter: Payer: Self-pay | Admitting: Family Medicine

## 2024-03-08 ENCOUNTER — Ambulatory Visit (INDEPENDENT_AMBULATORY_CARE_PROVIDER_SITE_OTHER): Admitting: Family Medicine

## 2024-03-08 VITALS — BP 106/68 | HR 71 | Temp 98.7°F | Ht 62.0 in | Wt 143.0 lb

## 2024-03-08 DIAGNOSIS — Z720 Tobacco use: Secondary | ICD-10-CM | POA: Insufficient documentation

## 2024-03-08 DIAGNOSIS — J441 Chronic obstructive pulmonary disease with (acute) exacerbation: Secondary | ICD-10-CM | POA: Diagnosis not present

## 2024-03-08 MED ORDER — BUPROPION HCL ER (SR) 150 MG PO TB12: 150.0000 mg | ORAL_TABLET | Freq: Two times a day (BID) | ORAL | 3 refills | Status: AC

## 2024-03-08 MED ORDER — AZITHROMYCIN 250 MG PO TABS: ORAL_TABLET | ORAL | 0 refills | Status: AC

## 2024-03-08 MED ORDER — PREDNISONE 50 MG PO TABS: 50.0000 mg | ORAL_TABLET | Freq: Every day | ORAL | 0 refills | Status: AC

## 2024-03-08 NOTE — Progress Notes (Signed)
 Subjective:  Patient ID: Mary Hart, female    DOB: [birth year]  Age: 83 y.o. MRN: [id number]  CC:   Chief Complaint  Patient presents with   Cough    Productive cough, yellow mucus , congestion and SOB. Started last week. Saturday it came back and settled in chest    Insect Bite    Tick bite     HPI:  83 year old female presents for evaluation of the above.  Patient has known COPD.  Still smoking.  She reports that her symptoms started on Friday.  She reports productive cough, congestion, and increasing shortness of breath.  No relieving factors.  Patient feels poorly.  No fevers.  No chills.  Patient endorses that she is ready to stop smoking.  She has had prior success with Wellbutrin.  Patient Active Problem List   Diagnosis Date Noted   COPD exacerbation (HCC) 03/08/2024   Tobacco abuse 03/08/2024   Hyperlipidemia 01/20/2024   Cervical radiculopathy 01/20/2024   Calcification of coronary artery 12/13/2023   Age-related osteoporosis without current pathological fracture 08/16/2023   Anxiety 07/01/2023   History of hysterectomy 04/07/2023   Diverticulosis 04/07/2023   COPD (chronic obstructive pulmonary disease) (HCC) 02/02/2023   Adnexal mass 02/02/2023   Hypothyroidism 02/02/2023   Aortic atherosclerosis (HCC) 02/01/2023   OA (osteoarthritis) of knee 12/27/2016   Constipation 05/04/2012   Irritable bowel syndrome 05/04/2012   GERD (gastroesophageal reflux disease) 02/14/2012    Social Hx   Social History   Socioeconomic History   Marital status: Widowed    Spouse name: Not on file   Number of children: 4   Years of education: Not on file   Highest education level: Some college, no degree  Occupational History   Occupation: Retired    Associate Professor: RETIRED  Tobacco Use   Smoking status: Former    Current packs/day: 0.00    Average packs/day: 0.7 packs/day for 50.0 years (33.0 ttl pk-yrs)    Types: Cigarettes    Start date: 36    Quit date: 2017     Years since quitting: 8.3   Smokeless tobacco: Never   Tobacco comments:    Currently doing 1/2 pack per day   Vaping Use   Vaping status: Former  Substance and Sexual Activity   Alcohol use: Yes    Comment: occasional glass of wine   Drug use: No   Sexual activity: Not Currently  Other Topics Concern   Not on file  Social History Narrative   Married, 2 sons and 2 daughters   Husband chronically ill and she helps with care   She is retired   Teacher, 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 Risk  (01/16/2024)   Overall Financial Resource Strain (CARDIA)    Difficulty of Paying Living Expenses: Not very hard  Food Insecurity: No Food Insecurity (01/16/2024)   Hunger Vital Sign    Worried About Running Out of Food in the Last Year: Never true    Ran Out of Food in the Last Year: Never true  Transportation Needs: No Transportation Needs (01/16/2024)   PRAPARE - Administrator, Civil Service (Medical): No    Lack of Transportation (Non-Medical): No  Physical Activity: Sufficiently Active (01/16/2024)   Exercise Vital Sign    Days of Exercise per Week: 3 days    Minutes of Exercise per Session: 50 min  Stress: No Stress Concern Present (01/16/2024)   [employer] of Occupational Health - Occupational  Stress Questionnaire    Feeling of Stress : Not at all  Social Connections: Moderately Integrated (01/16/2024)   Social Connection and Isolation Panel [NHANES]    Frequency of Communication with Friends and Family: More than three times a week    Frequency of Social Gatherings with Friends and Family: Twice a week    Attends Religious Services: More than 4 times per year    Active Member of [employer] or Organizations: Yes    Attends Banker Meetings: More than 4 times per year    Marital Status: Widowed    Review of Systems Per HPI  Objective:  BP 106/68   Pulse 71   Temp 98.7 F (37.1 C)   Ht 5\' 2"  (1.575 m)   Wt 143 lb (64.9 kg)   SpO2 97%    BMI 26.16 kg/m      03/08/2024   11:11 AM 01/19/2024    2:37 PM 12/13/2023    3:12 PM  BP/Weight  Systolic BP 106 129 116  Diastolic BP 68 84 78  Wt. (Lbs) 143 144 147.2  BMI 26.16 kg/m2 26.34 kg/m2 26.92 kg/m2    Physical Exam Vitals and nursing note reviewed.  Constitutional:      General: She is not in acute distress.    Appearance: Normal appearance.  HENT:     Head: Normocephalic and atraumatic.  Eyes:     General:        Right eye: No discharge.        Left eye: No discharge.     Conjunctiva/sclera: Conjunctivae normal.  Cardiovascular:     Rate and Rhythm: Normal rate and regular rhythm.  Pulmonary:     Effort: Pulmonary effort is normal.     Breath sounds: Wheezing present. No rhonchi or rales.  Neurological:     Mental Status: She is alert.  Psychiatric:        Mood and Affect: Mood normal.        Behavior: Behavior normal.     Lab Results  Component Value Date   WBC 7.9 05/10/2023   HGB 13.9 05/10/2023   HCT 42.2 05/10/2023   PLT 303 05/10/2023   GLUCOSE 78 12/19/2023   CHOL 161 12/19/2023   TRIG 81 12/19/2023   HDL 73 12/19/2023   LDLCALC 73 12/19/2023   ALT 11 12/19/2023   AST 18 12/19/2023   NA 140 12/19/2023   K 4.0 12/19/2023   CL 104 12/19/2023   CREATININE 0.82 12/19/2023   BUN 10 12/19/2023   CO2 22 12/19/2023   TSH 1.320 12/19/2023   INR 0.93 12/21/2016     Assessment & Plan:  COPD exacerbation (HCC) Assessment & Plan: Treating with azithromycin and prednisone .  Orders: -     Azithromycin; Take 2 tablets on day 1, then 1 tablet daily on days 2 through 5  Dispense: 6 tablet; Refill: 0 -     predniSONE ; Take 1 tablet (50 mg total) by mouth daily for 5 days.  Dispense: 5 tablet; Refill: 0  Tobacco abuse Assessment & Plan: Starting Wellbutrin.  Orders: -     buPROPion HCl ER (SR); Take 1 tablet (150 mg total) by mouth 2 (two) times daily.  Dispense: 180 tablet; Refill: 3    Follow-up:  Return if symptoms worsen or fail  to improve.  Kathleen Papa DO [facility]

## 2024-03-08 NOTE — Assessment & Plan Note (Signed)
 Treating with azithromycin and prednisone .

## 2024-03-08 NOTE — Assessment & Plan Note (Signed)
 Starting Wellbutrin.

## 2024-03-14 ENCOUNTER — Ambulatory Visit: Payer: PPO | Admitting: "Endocrinology

## 2024-03-16 ENCOUNTER — Telehealth: Payer: Self-pay

## 2024-03-16 ENCOUNTER — Other Ambulatory Visit (HOSPITAL_COMMUNITY): Payer: Self-pay

## 2024-03-16 NOTE — Telephone Encounter (Signed)
*  Pulm  Pharmacy Patient Advocate Encounter   Received notification from CoverMyMeds that prior authorization for Fluticasone  Furoate-Vilanterol 200-25MCG/ACT aerosol powder  is required/requested.   Insurance verification completed.   The patient is insured through [facility] ADVANT[employer]/RX ADVANCE .   Per test claim: Refill too soon. PA is not needed at this time. Medication was filled 05/22. Next eligible fill date is 06/14.

## 2024-04-05 DIAGNOSIS — H353113 Nonexudative age-related macular degeneration, right eye, advanced atrophic without subfoveal involvement: Secondary | ICD-10-CM | POA: Diagnosis not present

## 2024-04-05 DIAGNOSIS — H35033 Hypertensive retinopathy, bilateral: Secondary | ICD-10-CM | POA: Diagnosis not present

## 2024-04-05 DIAGNOSIS — H26491 Other secondary cataract, right eye: Secondary | ICD-10-CM | POA: Diagnosis not present

## 2024-04-05 DIAGNOSIS — H43393 Other vitreous opacities, bilateral: Secondary | ICD-10-CM | POA: Diagnosis not present

## 2024-04-05 DIAGNOSIS — H43813 Vitreous degeneration, bilateral: Secondary | ICD-10-CM | POA: Diagnosis not present

## 2024-04-05 DIAGNOSIS — H353124 Nonexudative age-related macular degeneration, left eye, advanced atrophic with subfoveal involvement: Secondary | ICD-10-CM | POA: Diagnosis not present

## 2024-04-15 DIAGNOSIS — J441 Chronic obstructive pulmonary disease with (acute) exacerbation: Secondary | ICD-10-CM | POA: Diagnosis not present

## 2024-04-24 ENCOUNTER — Other Ambulatory Visit: Payer: Self-pay | Admitting: Nurse Practitioner

## 2024-04-30 ENCOUNTER — Ambulatory Visit: Admitting: Podiatry

## 2024-04-30 ENCOUNTER — Encounter: Payer: Self-pay | Admitting: Podiatry

## 2024-04-30 DIAGNOSIS — L84 Corns and callosities: Secondary | ICD-10-CM

## 2024-04-30 NOTE — Progress Notes (Signed)
 This patient presents to the office with painful corn on the bottom of her big toe left foot.  This is painful walking and wearing her shoes.  She had similar problem in her right big toe which hs healed.  She presents to the office for evaluation and treatment.  Vascular  Dorsalis pedis and posterior tibial pulses are palpable  B/L.  Capillary return  WNL.  Temperature gradient is  WNL.  Skin turgor  WNL  Sensorium  Senn Weinstein monofilament wire  WNL. Normal tactile sensation.  Nail Exam  Patient has normal nails with no evidence of bacterial or fungal infection.  Orthopedic  Exam  Muscle tone and muscle strength  WNL.  No limitations of motion feet  B/L.  No crepitus or joint effusion noted.  Foot type is unremarkable and digits show no abnormalities.  Hallux limitus and HAV  B/L.  Skin  No open lesions.  Normal skin texture and turgor.  Corn under IPJ left hallux.  Palpable pain noted.  Corn left hallux   Debride corn with # 15 blade.  Corn caused by dorsiflexion IPJ  B/L caused by head proximal phalanx. Discussed condition with patient.  Padding dispensed.   Cordella Bold DPM

## 2024-05-04 ENCOUNTER — Ambulatory Visit (INDEPENDENT_AMBULATORY_CARE_PROVIDER_SITE_OTHER): Payer: PPO

## 2024-05-04 VITALS — Ht 62.0 in | Wt 143.0 lb

## 2024-05-04 DIAGNOSIS — Z0000 Encounter for general adult medical examination without abnormal findings: Secondary | ICD-10-CM | POA: Diagnosis not present

## 2024-05-04 NOTE — Progress Notes (Signed)
 Subjective:   Mary Hart is a 83 y.o. who presents for a Medicare Wellness preventive visit.  As a reminder, Annual Wellness Visits don't include a physical exam, and some assessments may be limited, especially if this visit is performed virtually. We may recommend an in-person follow-up visit with your provider if needed.  Visit Complete: Virtual I connected with  Mary Hart on 05/04/24 by a audio enabled telemedicine application and verified that I am speaking with the correct person using two identifiers.  Patient Location: Home  Provider Location: Home Office  I discussed the limitations of evaluation and management by telemedicine. The patient expressed understanding and agreed to proceed.  Vital Signs: Because this visit was a virtual/telehealth visit, some criteria may be missing or patient reported. Any vitals not documented were not able to be obtained and vitals that have been documented are patient reported.  VideoDeclined- This patient declined Librarian, academic. Therefore the visit was completed with audio only.  Persons Participating in Visit: Patient.  AWV Questionnaire: No: Patient Medicare AWV questionnaire was not completed prior to this visit.  Cardiac Risk Factors include: advanced age (>27men, >52 women);smoking/ tobacco exposure     Objective:    Today's Vitals   05/04/24 0808  Weight: 143 lb (64.9 kg)  Height: 5' 2 (1.575 m)   Body mass index is 26.16 kg/m.     05/04/2024    8:34 AM 04/22/2023    8:51 AM 04/06/2023   11:07 AM 12/07/2021    8:32 AM 11/26/2021   10:34 AM 03/24/2021    8:41 AM 04/28/2017   10:30 AM  Advanced Directives  Does Patient Have a Medical Advance Directive? No No Yes Yes Yes Yes Yes   Type of Surveyor, minerals;Living will  Healthcare Power of [place];Living will Living will;Healthcare Power of [employer] Power of [place];Living will  Does patient  want to make changes to medical advance directive?    No - Patient declined No - Patient declined    Copy of Healthcare Power of Attorney in Chart?     No - copy requested No - copy requested No - copy requested   Would patient like information on creating a medical advance directive? Yes (MAU/Ambulatory/Procedural Areas - Information given) No - Patient declined          Data saved with a previous flowsheet row definition    Current Medications (verified) Outpatient Encounter Medications as of 05/04/2024  Medication Sig   albuterol  (VENTOLIN  HFA) 108 (90 Base) MCG/ACT inhaler Inhale 1-2 puffs into the lungs every 6 (six) hours as needed for wheezing or shortness of breath.   ALPRAZolam  (XANAX ) 0.25 MG tablet Take 1 tablet by mouth daily as needed for anxiety.   Aspirin-Salicylamide-Caffeine (ARTHRITIS STRENGTH BC POWDER PO) Take by mouth. One daily prn   buPROPion  (WELLBUTRIN  SR) 150 MG 12 hr tablet Take 1 tablet (150 mg total) by mouth 2 (two) times daily.   cetirizine (ZYRTEC) 10 MG tablet Take 10 mg by mouth at bedtime.   cholecalciferol (VITAMIN D3) 25 MCG (1000 UNIT) tablet Take 1,000 Units by mouth daily.   fluticasone  (FLONASE) 50 MCG/ACT nasal spray Place into both nostrils daily.   fluticasone  furoate-vilanterol (BREO ELLIPTA ) 200-25 MCG/ACT AEPB Inhale 1 puff into the lungs daily.   levothyroxine  (SYNTHROID ) 75 MCG tablet TAKE 1 TABLET BY MOUTH ONCE DAILY BEFORE BREAKFAST   polyethylene glycol powder (GLYCOLAX /MIRALAX ) 17 GM/SCOOP powder Take 1 Container  by mouth once.   No facility-administered encounter medications on file as of 05/04/2024.    Allergies (verified) Oxycodone , Codeine, Hydrocodone , Hydromorphone  hcl, Levonorgestrel-ethinyl estrad, Morphine, Moxifloxacin, and Sulfonamide derivatives   History: Past Medical History:  Diagnosis Date   Anxiety    Arthritis    oa   Cataract    Clostridium difficile infection 2013, 2015   Treated with vancomycin    COPD (chronic  obstructive pulmonary disease) (HCC)    minimal shortness of breath   Depression    Diverticula of colon    Genital herpes    GERD (gastroesophageal reflux disease)    History of total knee replacement, right 03/28/2019   Hypothyroid    IBS (irritable bowel syndrome)    Internal hemorrhoids    Jaundice    as child cause unknown   Neuropathy    fingers both hands   PONV (postoperative nausea and vomiting)    did well with last neck fusion    Recurrent colitis due to Clostridium difficile 07/03/2013   4 treatments with vancomycin  since late 2013/early 2014 - responds then relapses     Vocal cord dysfunction    SYASMONIC DYSPHONIA   Past Surgical History:  Procedure Laterality Date   ABDOMINAL HYSTERECTOMY     Anterior Cervical Fusion X 2     BREAST BIOPSY Bilateral    benign   CHOLECYSTECTOMY     COLONOSCOPY  06/20/2008   [facility] Dr. Vilinda external hemorrhoids   COLONOSCOPY  10/12/2012   Dr. Shaaron- internal hemorrhoids, very early, few, sigmoid diverticula o/w normal appearing colon and terminal ileum. + cdiff on stool samples taken. Segmental bx negative.   ESOPHAGOGASTRODUODENOSCOPY  11/25/2010   Rourk- tubular esophagus s/p of 56 dilator from a small HH/otherwise normal   EYE SURGERY Bilateral 2012   ioc with lens replacement for cataracts   JOINT REPLACEMENT  09/2010   Thumb left   meniscus repair right knee  2014   NASAL SEPTOPLASTY W/ TURBINOPLASTY Bilateral 12/07/2021   Procedure: NASAL SEPTOPLASTY WITH TURBINATE REDUCTION;  Surgeon: Karis Clunes, MD;  Location: [facility];  Service: ENT;  Laterality: Bilateral;   SPINE SURGERY  1990   TOTAL KNEE ARTHROPLASTY Right 12/27/2016   Procedure: RIGHT TOTAL KNEE ARTHROPLASTY;  Surgeon: Dempsey Moan, MD;  Location: [facility];  Service: Orthopedics;  Laterality: Right;  Adductor Block   VESICOVAGINAL FISTULA CLOSURE W/ TAH     Family History  Problem Relation Age of Onset   Dementia Mother    Breast cancer  Mother    Dementia Father    Esophageal cancer Brother    Colon cancer Maternal Uncle    Breast cancer Cousin    Allergic rhinitis Neg Hx    Asthma Neg Hx    Eczema Neg Hx    Urticaria Neg Hx    Prostate cancer Neg Hx    Pancreatic cancer Neg Hx    Ovarian cancer Neg Hx    Endometrial cancer Neg Hx    Social History   Socioeconomic History   Marital status: Widowed    Spouse name: Not on file   Number of children: 4   Years of education: Not on file   Highest education level: Some college, no degree  Occupational History   Occupation: Retired    Associate Professor: RETIRED  Tobacco Use   Smoking status: Former    Current packs/day: 0.00    Average packs/day: 0.7 packs/day for 50.0 years (33.0 ttl pk-yrs)    Types:  Cigarettes    Start date: 48    Quit date: 2017    Years since quitting: 8.5   Smokeless tobacco: Never   Tobacco comments:    Currently doing 1/2 pack per day ; quit 03/25/24   Vaping Use   Vaping status: Former  Substance and Sexual Activity   Alcohol use: Yes    Comment: occasional glass of wine   Drug use: No   Sexual activity: Not Currently  Other Topics Concern   Not on file  Social History Narrative   Married, 2 sons and 2 daughters   Husband chronically ill and she helps with care   She is retired   Teacher, 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 Risk  (05/04/2024)   Overall Financial Resource Strain (CARDIA)    Difficulty of Paying Living Expenses: Not very hard  Food Insecurity: No Food Insecurity (05/04/2024)   Hunger Vital Sign    Worried About Running Out of Food in the Last Year: Never true    Ran Out of Food in the Last Year: Never true  Transportation Needs: No Transportation Needs (05/04/2024)   PRAPARE - Administrator, Civil Service (Medical): No    Lack of Transportation (Non-Medical): No  Physical Activity: Sufficiently Active (05/04/2024)   Exercise Vital Sign    Days of Exercise per Week: 3 days    Minutes of  Exercise per Session: 60 min  Stress: No Stress Concern Present (05/04/2024)   [employer] of Occupational Health - Occupational Stress Questionnaire    Feeling of Stress: Not at all  Social Connections: Moderately Integrated (05/04/2024)   Social Connection and Isolation Panel    Frequency of Communication with Friends and Family: More than three times a week    Frequency of Social Gatherings with Friends and Family: Twice a week    Attends Religious Services: More than 4 times per year    Active Member of [employer] or Organizations: Yes    Attends Banker Meetings: More than 4 times per year    Marital Status: Widowed    Tobacco Counseling Counseling given: Not Answered Tobacco comments: Currently doing 1/2 pack per day ; quit 03/25/24     Clinical Intake:  Pre-visit preparation completed: Yes  Pain : No/denies pain     Diabetes: No  No results found for: HGBA1C   How often do you need to have someone help you when you read instructions, pamphlets, or other written materials from your doctor or pharmacy?: 1 - Never  Interpreter Needed?: No  Information entered by :: Charmaine Bloodgood LPN   Activities of Daily Living     05/04/2024    8:34 AM  In your present state of health, do you have any difficulty performing the following activities:  Hearing? 0  Vision? 0  Difficulty concentrating or making decisions? 0  Walking or climbing stairs? 0  Dressing or bathing? 0  Doing errands, shopping? 0  Preparing Food and eating ? N  Using the Toilet? N  In the past six months, have you accidently leaked urine? N  Do you have problems with loss of bowel control? N  Managing your Medications? N  Managing your Finances? N  Housekeeping or managing your Housekeeping? N    Patient Care Team: Cook, Jayce G, DO as PCP - General (Family Medicine) Mallipeddi, Vishnu P, MD as PCP - Cardiology (Cardiology) Shaaron Lamar HERO, MD (Gastroenterology) Loreda Hacker, DPM  as Consulting Physician (Podiatry)  Shona Rush, MD (Dermatology) Raj Rankin SAUNDERS, MD as Referring Physician (Ophthalmology)  I have updated your Care Teams any recent Medical Services you may have received from other providers in the past year.     Assessment:   This is a routine wellness examination for Darion.  Hearing/Vision screen Hearing Screening - Comments:: Denies hearing difficulties   Vision Screening - Comments:: up to date with routine eye exams with Dr. Raj    Goals Addressed             This Visit's Progress    Remain active and independent   On track      Depression Screen     05/04/2024    8:33 AM 01/19/2024    2:39 PM 06/30/2023   11:06 AM 05/10/2023    3:05 PM 04/22/2023    8:56 AM 03/22/2023    2:35 PM 02/01/2023    2:14 PM  PHQ 2/9 Scores  PHQ - 2 Score 0 0 0 1 0 0 0  PHQ- 9 Score  0 3 6 0 2 1    Fall Risk     05/04/2024    8:34 AM 01/19/2024    2:39 PM 10/11/2023   10:42 AM 10/11/2023   10:27 AM 06/30/2023   11:06 AM  Fall Risk   Falls in the past year? 0 0 0 0 0  Number falls in past yr: 0 0     Injury with Fall? 0 0     Risk for fall due to : No Fall Risks No Fall Risks     Follow up Falls prevention discussed;Education provided;Falls evaluation completed Falls evaluation completed       MEDICARE RISK AT HOME:  Medicare Risk at Home Any stairs in or around the home?: No If so, are there any without handrails?: No Home free of loose throw rugs in walkways, pet beds, electrical cords, etc?: Yes Adequate lighting in your home to reduce risk of falls?: Yes Life alert?: No Use of a cane, walker or w/c?: No Grab bars in the bathroom?: Yes Shower chair or bench in shower?: No Elevated toilet seat or a handicapped toilet?: Yes  TIMED UP AND GO:  Was the test performed?  No  Cognitive Function: Declined/Normal: No cognitive concerns noted by patient or family. Patient alert, oriented, able to answer questions appropriately and recall  recent events. No signs of memory loss or confusion.        04/22/2023    8:54 AM  6CIT Screen  What Year? 0 points  What month? 0 points  What time? 0 points  Count back from 20 0 points  Months in reverse 0 points  Repeat phrase 0 points  Total Score 0 points    Immunizations Immunization History  Administered Date(s) Administered   Fluad Trivalent(High Dose 65+) 08/09/2023   Influenza, High Dose Seasonal PF 08/29/2018, 07/29/2019   Influenza-Unspecified 08/25/2018   PFIZER Comirnaty(Gray Top)Covid-19 Tri-Sucrose Vaccine 08/26/2023   PFIZER(Purple Top)SARS-COV-2 Vaccination 11/24/2019, 12/15/2019, 07/24/2020   Tdap 05/28/2021   Zoster Recombinant(Shingrix) 05/28/2021    Screening Tests Health Maintenance  Topic Date Due   Pneumococcal Vaccine: 50+ Years (1 of 2 - PCV) Never done   Zoster Vaccines- Shingrix (2 of 2) 07/23/2021   COVID-19 Vaccine (5 - 2024-25 season) 10/21/2023   INFLUENZA VACCINE  05/25/2024   Medicare Annual Wellness (AWV)  05/04/2025   Colonoscopy  03/27/2028   DTaP/Tdap/Td (2 - Td or Tdap) 05/29/2031   DEXA SCAN  Completed   Hepatitis B Vaccines  Aged Out   HPV VACCINES  Aged Out   Meningococcal B Vaccine  Aged Out   Lung Cancer Screening  Discontinued    Health Maintenance  Health Maintenance Due  Topic Date Due   Pneumococcal Vaccine: 50+ Years (1 of 2 - PCV) Never done   Zoster Vaccines- Shingrix (2 of 2) 07/23/2021   COVID-19 Vaccine (5 - 2024-25 season) 10/21/2023    Additional Screening:  Vision Screening: Recommended annual ophthalmology exams for early detection of glaucoma and other disorders of the eye. Would you like a referral to an eye doctor? No    Dental Screening: Recommended annual dental exams for proper oral hygiene  Community Resource Referral / Chronic Care Management: CRR required this visit?  No   CCM required this visit?  No   Plan:    I have personally reviewed and noted the following in the patient's  chart:   Medical and social history Use of alcohol, tobacco or illicit drugs  Current medications and supplements including opioid prescriptions. Patient is not currently taking opioid prescriptions. Functional ability and status Nutritional status Physical activity Advanced directives List of other physicians Hospitalizations, surgeries, and ER visits in previous 12 months Vitals Screenings to include cognitive, depression, and falls Referrals and appointments  In addition, I have reviewed and discussed with patient certain preventive protocols, quality metrics, and best practice recommendations. A written personalized care plan for preventive services as well as general preventive health recommendations were provided to patient.   Lavelle Pfeiffer [place], [place]   2/88/7974   After Visit Summary: (MyChart) Due to this being a telephonic visit, the after visit summary with patients personalized plan was offered to patient via MyChart   Notes: Please refer to Routing Comments.

## 2024-05-04 NOTE — Patient Instructions (Signed)
 Mary Hart , Thank you for taking time out of your busy schedule to complete your Annual Wellness Visit with me. I enjoyed our conversation and look forward to speaking with you again next year. I, as well as your care team,  appreciate your ongoing commitment to your health goals. Please review the following plan we discussed and let me know if I can assist you in the future. Your Game plan/ To Do List    Follow up Visits: Next Medicare AWV with our clinical staff: In 1 year    Have you seen your provider in the last 6 months (3 months if uncontrolled diabetes)? Yes Next Office Visit with your provider: 07/31/24 @ 1:50  Clinician Recommendations:  Aim for 30 minutes of exercise or brisk walking, 6-8 glasses of water , and 5 servings of fruits and vegetables each day.       This is a list of the screening recommended for you and due dates:  Health Maintenance  Topic Date Due   Pneumococcal Vaccine for age over 39 (1 of 2 - PCV) Never done   Zoster (Shingles) Vaccine (2 of 2) 07/23/2021   COVID-19 Vaccine (5 - 2024-25 season) 10/21/2023   Flu Shot  05/25/2024   Medicare Annual Wellness Visit  05/04/2025   Colon Cancer Screening  03/27/2028   DTaP/Tdap/Td vaccine (2 - Td or Tdap) 05/29/2031   DEXA scan (bone density measurement)  Completed   Hepatitis B Vaccine  Aged Out   HPV Vaccine  Aged Out   Meningitis B Vaccine  Aged Out   Screening for Lung Cancer  Discontinued    Advanced directives: (ACP Link)Information on Advanced Care Planning can be found at Radiation protection practitioner Advance Health Care Directives Advance Health Care Directives. [email or web address]   Advance Care Planning is important because it:  [x]  Makes sure you receive the medical care that is consistent with your values, goals, and preferences  [x]  It provides guidance to your family and loved ones and reduces their decisional burden about whether or not they are making the right decisions based on your  wishes.  Follow the link provided in your after visit summary or read over the paperwork we have mailed to you to help you started getting your Advance Directives in place. If you need assistance in completing these, please reach out to us  so that we can help you!  See attachments for Preventive Care and Fall Prevention Tips.

## 2024-05-31 DIAGNOSIS — H43393 Other vitreous opacities, bilateral: Secondary | ICD-10-CM | POA: Diagnosis not present

## 2024-05-31 DIAGNOSIS — H35033 Hypertensive retinopathy, bilateral: Secondary | ICD-10-CM | POA: Diagnosis not present

## 2024-05-31 DIAGNOSIS — H43813 Vitreous degeneration, bilateral: Secondary | ICD-10-CM | POA: Diagnosis not present

## 2024-05-31 DIAGNOSIS — H353133 Nonexudative age-related macular degeneration, bilateral, advanced atrophic without subfoveal involvement: Secondary | ICD-10-CM | POA: Diagnosis not present

## 2024-05-31 DIAGNOSIS — H26491 Other secondary cataract, right eye: Secondary | ICD-10-CM | POA: Diagnosis not present

## 2024-06-01 ENCOUNTER — Telehealth (INDEPENDENT_AMBULATORY_CARE_PROVIDER_SITE_OTHER): Payer: Self-pay

## 2024-06-04 ENCOUNTER — Telehealth (INDEPENDENT_AMBULATORY_CARE_PROVIDER_SITE_OTHER): Payer: Self-pay | Admitting: Otolaryngology

## 2024-06-04 NOTE — Telephone Encounter (Signed)
 Returned patient 's phone call. Left her a voice mail.

## 2024-06-12 NOTE — Telephone Encounter (Signed)
 See updated note

## 2024-06-18 ENCOUNTER — Other Ambulatory Visit (HOSPITAL_COMMUNITY): Payer: Self-pay | Admitting: Family Medicine

## 2024-06-18 DIAGNOSIS — Z1231 Encounter for screening mammogram for malignant neoplasm of breast: Secondary | ICD-10-CM

## 2024-06-21 ENCOUNTER — Other Ambulatory Visit: Payer: Self-pay | Admitting: Acute Care

## 2024-06-21 DIAGNOSIS — J449 Chronic obstructive pulmonary disease, unspecified: Secondary | ICD-10-CM

## 2024-06-26 DIAGNOSIS — D485 Neoplasm of uncertain behavior of skin: Secondary | ICD-10-CM | POA: Diagnosis not present

## 2024-07-02 DIAGNOSIS — M1712 Unilateral primary osteoarthritis, left knee: Secondary | ICD-10-CM | POA: Diagnosis not present

## 2024-07-10 DIAGNOSIS — Z6825 Body mass index (BMI) 25.0-25.9, adult: Secondary | ICD-10-CM | POA: Diagnosis not present

## 2024-07-10 DIAGNOSIS — L309 Dermatitis, unspecified: Secondary | ICD-10-CM | POA: Diagnosis not present

## 2024-07-11 ENCOUNTER — Telehealth: Payer: Self-pay

## 2024-07-11 ENCOUNTER — Other Ambulatory Visit: Payer: Self-pay | Admitting: Family Medicine

## 2024-07-11 DIAGNOSIS — E673 Hypervitaminosis D: Secondary | ICD-10-CM

## 2024-07-11 DIAGNOSIS — E785 Hyperlipidemia, unspecified: Secondary | ICD-10-CM

## 2024-07-11 DIAGNOSIS — E039 Hypothyroidism, unspecified: Secondary | ICD-10-CM

## 2024-07-11 NOTE — Telephone Encounter (Signed)
 Okay to order? Thanks.   Copied from CRM [id number]. Topic: Clinical - Lab/Test Results >> Jul 11, 2024  2:07 PM [place] B wrote: Reason for CRM: pt called in wants labs done for cholesterol thyroid  and vitamin d 

## 2024-07-12 ENCOUNTER — Ambulatory Visit: Admitting: Pulmonary Disease

## 2024-07-12 ENCOUNTER — Encounter: Payer: Self-pay | Admitting: Pulmonary Disease

## 2024-07-12 VITALS — BP 160/84 | HR 57 | Ht 62.0 in | Wt 140.0 lb

## 2024-07-12 DIAGNOSIS — Z87891 Personal history of nicotine dependence: Secondary | ICD-10-CM

## 2024-07-12 DIAGNOSIS — J449 Chronic obstructive pulmonary disease, unspecified: Secondary | ICD-10-CM

## 2024-07-12 NOTE — Patient Instructions (Signed)
 Follow-up a year from now  Make sure you follow-up with your CT scan-low-dose CT scan  Hold off on using Breo  Use albuterol  as needed If you are needing albuterol  frequently then go back to using Breo  No testing needs done to determine whether you need to continue Breo or not  As long as your breathing continues to be stable, you do not need to go back on Breo  Call us  with significant concerns

## 2024-07-12 NOTE — Progress Notes (Signed)
 Mary Hart    [id number]    03/03/1941  Primary Care Physician:Cook, Jayce G, DO  Referring Physician: Cook, Jayce G, DO [address] [place],  [place] [address]  Chief complaint:   Patient being seen for protracted shortness of breath In for follow up  HPI:  Patient with COPD Has been using Breo Did not tolerate Breztri in the past  Has been doing really well Exercises regularly, goes to the gym  She feels she may not want to continue Breo any longer Dealing with osteopenia, osteoporosis Concern being that being on a steroid inhaler may be contributing  Has not been using albuterol   Breathing continues to improve  Denies pain or discomfort  Past history of smoking  Outpatient Encounter Medications as of 07/12/2024  Medication Sig   albuterol  (VENTOLIN  HFA) 108 (90 Base) MCG/ACT inhaler Inhale 1-2 puffs into the lungs every 6 (six) hours as needed for wheezing or shortness of breath.   ALPRAZolam  (XANAX ) 0.25 MG tablet Take 1 tablet by mouth daily as needed for anxiety.   Aspirin-Salicylamide-Caffeine (ARTHRITIS STRENGTH BC POWDER PO) Take by mouth. One daily prn   BREO ELLIPTA  200-25 MCG/ACT AEPB Inhale 1 puff by mouth once daily   buPROPion  (WELLBUTRIN  SR) 150 MG 12 hr tablet Take 1 tablet (150 mg total) by mouth 2 (two) times daily.   cetirizine (ZYRTEC) 10 MG tablet Take 10 mg by mouth at bedtime.   cholecalciferol (VITAMIN D3) 25 MCG (1000 UNIT) tablet Take 1,000 Units by mouth daily.   fluticasone  (FLONASE) 50 MCG/ACT nasal spray Place into both nostrils daily.   levothyroxine  (SYNTHROID ) 75 MCG tablet TAKE 1 TABLET BY MOUTH ONCE DAILY BEFORE BREAKFAST   polyethylene glycol powder (GLYCOLAX /MIRALAX ) 17 GM/SCOOP powder Take 1 Container by mouth once.   No facility-administered encounter medications on file as of 07/12/2024.    Allergies as of 07/12/2024 - Review Complete 07/12/2024  Allergen Reaction Noted   Oxycodone  Other (See Comments)  and Itching 11/05/2022   Codeine Nausea And Vomiting    Hydrocodone   08/19/2019   Hydromorphone  hcl Itching    Levonorgestrel-ethinyl estrad Other (See Comments) 11/05/2022   Morphine Nausea And Vomiting    Moxifloxacin Other (See Comments) 11/05/2022   Sulfonamide derivatives Other (See Comments)     Past Medical History:  Diagnosis Date   Anxiety    Arthritis    oa   Cataract    Clostridium difficile infection 2013, 2015   Treated with vancomycin    COPD (chronic obstructive pulmonary disease) (HCC)    minimal shortness of breath   Depression    Diverticula of colon    Genital herpes    GERD (gastroesophageal reflux disease)    History of total knee replacement, right 03/28/2019   Hypothyroid    IBS (irritable bowel syndrome)    Internal hemorrhoids    Jaundice    as child cause unknown   Neuropathy    fingers both hands   PONV (postoperative nausea and vomiting)    did well with last neck fusion    Recurrent colitis due to Clostridium difficile 07/03/2013   4 treatments with vancomycin  since late 2013/early 2014 - responds then relapses     Vocal cord dysfunction    SYASMONIC DYSPHONIA    Past Surgical History:  Procedure Laterality Date   ABDOMINAL HYSTERECTOMY     Anterior Cervical Fusion X 2     BREAST BIOPSY Bilateral  benign   CHOLECYSTECTOMY     COLONOSCOPY  06/20/2008   [facility] Dr. Vilinda external hemorrhoids   COLONOSCOPY  10/12/2012   Dr. Shaaron- internal hemorrhoids, very early, few, sigmoid diverticula o/w normal appearing colon and terminal ileum. + cdiff on stool samples taken. Segmental bx negative.   ESOPHAGOGASTRODUODENOSCOPY  11/25/2010   Rourk- tubular esophagus s/p of 56 dilator from a small HH/otherwise normal   EYE SURGERY Bilateral 2012   ioc with lens replacement for cataracts   JOINT REPLACEMENT  09/2010   Thumb left   meniscus repair right knee  2014   NASAL SEPTOPLASTY W/ TURBINOPLASTY Bilateral 12/07/2021   Procedure: NASAL  SEPTOPLASTY WITH TURBINATE REDUCTION;  Surgeon: Karis Clunes, MD;  Location: [facility];  Service: ENT;  Laterality: Bilateral;   SPINE SURGERY  1990   TOTAL KNEE ARTHROPLASTY Right 12/27/2016   Procedure: RIGHT TOTAL KNEE ARTHROPLASTY;  Surgeon: Dempsey Moan, MD;  Location: [facility];  Service: Orthopedics;  Laterality: Right;  Adductor Block   VESICOVAGINAL FISTULA CLOSURE W/ TAH      Family History  Problem Relation Age of Onset   Dementia Mother    Breast cancer Mother    Dementia Father    Esophageal cancer Brother    Colon cancer Maternal Uncle    Breast cancer Cousin    Allergic rhinitis Neg Hx    Asthma Neg Hx    Eczema Neg Hx    Urticaria Neg Hx    Prostate cancer Neg Hx    Pancreatic cancer Neg Hx    Ovarian cancer Neg Hx    Endometrial cancer Neg Hx     Social History   Socioeconomic History   Marital status: Widowed    Spouse name: Not on file   Number of children: 4   Years of education: Not on file   Highest education level: Some college, no degree  Occupational History   Occupation: Retired    Associate Professor: RETIRED  Tobacco Use   Smoking status: Former    Current packs/day: 0.00    Average packs/day: 0.7 packs/day for 50.0 years (33.0 ttl pk-yrs)    Types: Cigarettes    Start date: 77    Quit date: 2017    Years since quitting: 8.7   Smokeless tobacco: Never   Tobacco comments:    Currently doing 1/2 pack per day ; quit 03/25/24   Vaping Use   Vaping status: Former  Substance and Sexual Activity   Alcohol use: Yes    Comment: occasional glass of wine   Drug use: No   Sexual activity: Not Currently  Other Topics Concern   Not on file  Social History Narrative   Married, 2 sons and 2 daughters   Husband chronically ill and she helps with care   She is retired   Teacher, 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 Risk  (05/04/2024)   Overall Financial Resource Strain (CARDIA)    Difficulty of Paying Living Expenses: Not very hard   Food Insecurity: No Food Insecurity (05/04/2024)   Hunger Vital Sign    Worried About Running Out of Food in the Last Year: Never true    Ran Out of Food in the Last Year: Never true  Transportation Needs: No Transportation Needs (05/04/2024)   PRAPARE - Administrator, Civil Service (Medical): No    Lack of Transportation (Non-Medical): No  Physical Activity: Sufficiently Active (05/04/2024)   Exercise Vital Sign  Days of Exercise per Week: 3 days    Minutes of Exercise per Session: 60 min  Stress: No Stress Concern Present (05/04/2024)   [employer] of Occupational Health - Occupational Stress Questionnaire    Feeling of Stress: Not at all  Social Connections: Moderately Integrated (05/04/2024)   Social Connection and Isolation Panel    Frequency of Communication with Friends and Family: More than three times a week    Frequency of Social Gatherings with Friends and Family: Twice a week    Attends Religious Services: More than 4 times per year    Active Member of [employer] or Organizations: Yes    Attends Banker Meetings: More than 4 times per year    Marital Status: Widowed  Intimate Partner Violence: Not At Risk (05/04/2024)   Humiliation, Afraid, Rape, and Kick questionnaire    Fear of Current or Ex-Partner: No    Emotionally Abused: No    Physically Abused: No    Sexually Abused: No    Review of Systems  Respiratory:  Positive for cough and shortness of breath.     Vitals:   07/12/24 1052  BP: (!) 160/84  Pulse: (!) 57  SpO2: 97%     Physical Exam Constitutional:      Appearance: Normal appearance.  HENT:     Head: Normocephalic and atraumatic.     Mouth/Throat:     Mouth: Mucous membranes are moist.  Eyes:     General: No scleral icterus. Cardiovascular:     Rate and Rhythm: Normal rate and regular rhythm.     Heart sounds: No murmur heard.    No friction rub.  Pulmonary:     Effort: No respiratory distress.     Breath sounds:  No stridor. No wheezing or rhonchi.  Musculoskeletal:     Cervical back: No rigidity or tenderness.  Neurological:     Mental Status: She is alert.  Psychiatric:        Mood and Affect: Mood normal.    Data Reviewed: Chest x-ray 10/28/2022 reviewed showing bibasal haziness  Most recent CT was reviewed showing groundglass changes bases of the lungs  PFT was reviewed showing no obstruction, no significant bronchodilator response, no restriction, normal diffusing capacity  Assessment:   Patient with a history of chronic obstructive pulmonary disease Has been stable since her last visit Using Breo No need for albuterol   Has been doing well  Wants to hold off on inhalers because of issues with osteoporosis   Plan/Recommendations: Will hold Breo  Encouraged to make sure she has albuterol  route to use for shortness of breath  If not requiring albuterol  more than once or twice a week then may hold off on Breo permanently  Encouraged to continue graded activities as tolerated  Encouraged to give us  a call with significant concerns   Jennet Epley MD [facility] 07/12/2024, 11:10 AM  CC: Cook, Jayce G, DO

## 2024-07-18 ENCOUNTER — Other Ambulatory Visit: Payer: Self-pay | Admitting: Family Medicine

## 2024-07-18 DIAGNOSIS — E673 Hypervitaminosis D: Secondary | ICD-10-CM | POA: Diagnosis not present

## 2024-07-18 DIAGNOSIS — E785 Hyperlipidemia, unspecified: Secondary | ICD-10-CM | POA: Diagnosis not present

## 2024-07-18 DIAGNOSIS — E039 Hypothyroidism, unspecified: Secondary | ICD-10-CM | POA: Diagnosis not present

## 2024-07-19 LAB — LIPID PANEL
Chol/HDL Ratio: 2.1 ratio (ref 0.0–4.4)
Cholesterol, Total: 197 mg/dL (ref 100–199)
HDL: 94 mg/dL (ref >39)
LDL Chol Calc (NIH): 93 mg/dL (ref 0–99)
Triglycerides: 55 mg/dL (ref 0–149)
VLDL Cholesterol Cal: 10 mg/dL (ref 5–40)

## 2024-07-19 LAB — TSH: TSH: 4.6 u[IU]/mL — ABNORMAL HIGH (ref 0.450–4.500)

## 2024-07-19 LAB — VITAMIN D 25 HYDROXY (VIT D DEFICIENCY, FRACTURES): Vit D, 25-Hydroxy: 72.2 ng/mL (ref 30.0–100.0)

## 2024-07-20 ENCOUNTER — Ambulatory Visit: Admitting: Family Medicine

## 2024-07-20 DIAGNOSIS — B029 Zoster without complications: Secondary | ICD-10-CM | POA: Diagnosis not present

## 2024-07-22 ENCOUNTER — Other Ambulatory Visit: Payer: Self-pay | Admitting: Family Medicine

## 2024-07-22 ENCOUNTER — Ambulatory Visit: Payer: Self-pay | Admitting: Family Medicine

## 2024-07-22 MED ORDER — LEVOTHYROXINE SODIUM 88 MCG PO TABS: 88.0000 ug | ORAL_TABLET | Freq: Every day | ORAL | 1 refills | Status: AC

## 2024-07-26 DIAGNOSIS — H43813 Vitreous degeneration, bilateral: Secondary | ICD-10-CM | POA: Diagnosis not present

## 2024-07-26 DIAGNOSIS — H353133 Nonexudative age-related macular degeneration, bilateral, advanced atrophic without subfoveal involvement: Secondary | ICD-10-CM | POA: Diagnosis not present

## 2024-07-26 DIAGNOSIS — H43393 Other vitreous opacities, bilateral: Secondary | ICD-10-CM | POA: Diagnosis not present

## 2024-07-26 DIAGNOSIS — H35033 Hypertensive retinopathy, bilateral: Secondary | ICD-10-CM | POA: Diagnosis not present

## 2024-07-26 DIAGNOSIS — H26491 Other secondary cataract, right eye: Secondary | ICD-10-CM | POA: Diagnosis not present

## 2024-07-26 LAB — OPHTHALMOLOGY REPORT-SCANNED

## 2024-07-27 ENCOUNTER — Encounter: Payer: Self-pay | Admitting: Family Medicine

## 2024-07-30 ENCOUNTER — Ambulatory Visit: Admitting: Podiatry

## 2024-07-30 ENCOUNTER — Encounter: Payer: Self-pay | Admitting: Podiatry

## 2024-07-30 DIAGNOSIS — M79674 Pain in right toe(s): Secondary | ICD-10-CM

## 2024-07-30 DIAGNOSIS — M79675 Pain in left toe(s): Secondary | ICD-10-CM | POA: Diagnosis not present

## 2024-07-30 DIAGNOSIS — B351 Tinea unguium: Secondary | ICD-10-CM | POA: Diagnosis not present

## 2024-07-30 NOTE — Progress Notes (Addendum)
 This patient presents to the office with painful corn on the bottom of her big toe left foot.  This is painful walking and wearing her shoes.  She had similar problem in her right big toe which hs healed.  She presents to the office for evaluation and treatment.  Vascular  Dorsalis pedis and posterior tibial pulses are palpable  B/L.  Capillary return  WNL.  Temperature gradient is  WNL.  Skin turgor  WNL  Sensorium  Senn Weinstein monofilament wire  WNL. Normal tactile sensation.  Nail Exam  Patient has normal nails with no evidence of bacterial or fungal infection.  Orthopedic  Exam  Muscle tone and muscle strength  WNL.  No limitations of motion feet  B/L.  No crepitus or joint effusion noted.  Foot type is unremarkable and digits show no abnormalities.  Hallux limitus and HAV  B/L.  Skin  No open lesions.  Normal skin texture and turgor.  Corn has resolved.  Onchycomycosis  Hallux  B/L.  Debride nails with nail nipper and dremel tool. Padding dispensed.   Cordella Bold DPM

## 2024-07-31 ENCOUNTER — Ambulatory Visit: Admitting: Family Medicine

## 2024-07-31 ENCOUNTER — Encounter: Payer: Self-pay | Admitting: Family Medicine

## 2024-07-31 DIAGNOSIS — E039 Hypothyroidism, unspecified: Secondary | ICD-10-CM | POA: Diagnosis not present

## 2024-07-31 DIAGNOSIS — J449 Chronic obstructive pulmonary disease, unspecified: Secondary | ICD-10-CM | POA: Diagnosis not present

## 2024-07-31 DIAGNOSIS — E785 Hyperlipidemia, unspecified: Secondary | ICD-10-CM

## 2024-07-31 DIAGNOSIS — B009 Herpesviral infection, unspecified: Secondary | ICD-10-CM | POA: Insufficient documentation

## 2024-07-31 MED ORDER — VALACYCLOVIR HCL 1 G PO TABS
1000.0000 mg | ORAL_TABLET | Freq: Every day | ORAL | 3 refills | Status: AC
Start: 2024-07-31 — End: ?

## 2024-07-31 NOTE — Assessment & Plan Note (Signed)
 Synthroid  recently increased.

## 2024-07-31 NOTE — Patient Instructions (Addendum)
 Okay to taper off Wellbutrin  (1 tablet daily for 2 weeks, then discontinue).  Valtrex  sent in with refills.  Follow up in 6 months.

## 2024-07-31 NOTE — Assessment & Plan Note (Signed)
 Prophylactic Valtrex 

## 2024-07-31 NOTE — Assessment & Plan Note (Signed)
Fair control Will continue to monitor

## 2024-07-31 NOTE — Progress Notes (Signed)
 Subjective:  Patient ID: Mary Hart, female    DOB: [birth year]  Age: 83 y.o. MRN: [id number]  CC:  Follow up   HPI:  83 year old female presents for follow up.  She has quit smoking. Remains on Wellbutrin . Would like to discuss continuation vs discontinuation.   Patient reports that she has recurrent HSV to the back/buttocks. Needs refill on Valtrex . Would prefer daily prophylactic dosing.  COPD stable on Breo.  Fair control of lipids. Does not want treatment.  TSH recently elevated. Synthroid  dose increased.   Patient Active Problem List   Diagnosis Date Noted   Recurrent HSV (herpes simplex virus) 07/31/2024   Hyperlipidemia 01/20/2024   Calcification of coronary artery 12/13/2023   Age-related osteoporosis without current pathological fracture 08/16/2023   Anxiety 07/01/2023   History of hysterectomy 04/07/2023   COPD (chronic obstructive pulmonary disease) (HCC) 02/02/2023   Adnexal mass 02/02/2023   Hypothyroidism 02/02/2023   Aortic atherosclerosis 02/01/2023   OA (osteoarthritis) of knee 12/27/2016   Constipation 05/04/2012   Irritable bowel syndrome 05/04/2012   GERD (gastroesophageal reflux disease) 02/14/2012    Social Hx   Social History   Socioeconomic History   Marital status: Widowed    Spouse name: Not on file   Number of children: 4   Years of education: Not on file   Highest education level: Some college, no degree  Occupational History   Occupation: Retired    Associate Professor: RETIRED  Tobacco Use   Smoking status: Former    Current packs/day: 0.00    Average packs/day: 0.7 packs/day for 50.0 years (33.0 ttl pk-yrs)    Types: Cigarettes    Start date: 12    Quit date: 2017    Years since quitting: 8.7   Smokeless tobacco: Never   Tobacco comments:    Currently doing 1/2 pack per day ; quit 03/25/24   Vaping Use   Vaping status: Former  Substance and Sexual Activity   Alcohol use: Yes    Comment: occasional glass of wine   Drug use:  No   Sexual activity: Not Currently  Other Topics Concern   Not on file  Social History Narrative   Married, 2 sons and 2 daughters   Husband chronically ill and she helps with care   She is retired   Teacher, 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 Risk  (07/29/2024)   Overall Financial Resource Strain (CARDIA)    Difficulty of Paying Living Expenses: Not very hard  Food Insecurity: No Food Insecurity (07/29/2024)   Hunger Vital Sign    Worried About Running Out of Food in the Last Year: Never true    Ran Out of Food in the Last Year: Never true  Transportation Needs: No Transportation Needs (07/29/2024)   PRAPARE - Administrator, Civil Service (Medical): No    Lack of Transportation (Non-Medical): No  Physical Activity: Sufficiently Active (07/29/2024)   Exercise Vital Sign    Days of Exercise per Week: 5 days    Minutes of Exercise per Session: 50 min  Stress: No Stress Concern Present (07/29/2024)   [employer] of Occupational Health - Occupational Stress Questionnaire    Feeling of Stress: Not at all  Social Connections: Moderately Integrated (07/29/2024)   Social Connection and Isolation Panel    Frequency of Communication with Friends and Family: More than three times a week    Frequency of Social Gatherings with Friends and Family: Twice a week  Attends Religious Services: More than 4 times per year    Active Member of Clubs or Organizations: Yes    Attends Banker Meetings: More than 4 times per year    Marital Status: Widowed    Review of Systems Per HPI  Objective:  BP (!) 142/80   Pulse 90   Ht 5' 2 (1.575 m)   Wt 139 lb (63 kg)   SpO2 96%   BMI 25.42 kg/m      07/31/2024    1:40 PM 07/12/2024   10:52 AM 05/04/2024    8:08 AM  BP/Weight  Systolic BP 142 160 --  Diastolic BP 80 84 --  Wt. (Lbs) 139 140 143  BMI 25.42 kg/m2 25.61 kg/m2 26.16 kg/m2    Physical Exam Vitals and nursing note reviewed.   Constitutional:      General: She is not in acute distress.    Appearance: Normal appearance.  HENT:     Head: Normocephalic and atraumatic.  Eyes:     General:        Right eye: No discharge.        Left eye: No discharge.     Conjunctiva/sclera: Conjunctivae normal.  Cardiovascular:     Rate and Rhythm: Normal rate and regular rhythm.  Pulmonary:     Effort: Pulmonary effort is normal.     Breath sounds: Normal breath sounds. No wheezing, rhonchi or rales.  Neurological:     Mental Status: She is alert.  Psychiatric:        Mood and Affect: Mood normal.        Behavior: Behavior normal.     Lab Results  Component Value Date   WBC 7.9 05/10/2023   HGB 13.9 05/10/2023   HCT 42.2 05/10/2023   PLT 303 05/10/2023   GLUCOSE 78 12/19/2023   CHOL 197 07/18/2024   TRIG 55 07/18/2024   HDL 94 07/18/2024   LDLCALC 93 07/18/2024   ALT 11 12/19/2023   AST 18 12/19/2023   NA 140 12/19/2023   K 4.0 12/19/2023   CL 104 12/19/2023   CREATININE 0.82 12/19/2023   BUN 10 12/19/2023   CO2 22 12/19/2023   TSH 4.600 (H) 07/18/2024   INR 0.93 12/21/2016     Assessment & Plan:  Chronic obstructive pulmonary disease, unspecified COPD type (HCC) Assessment & Plan: Stable. Has quit smoking. Recommend continued used of Wellbutrin . I did give her information on how to taper off if she desires.   Hypothyroidism, unspecified type Assessment & Plan: Synthroid  recently increased.   Hyperlipidemia, unspecified hyperlipidemia type Assessment & Plan: Fair control. Will continue to monitor.   Recurrent HSV (herpes simplex virus) Assessment & Plan: Prophylactic Valtrex .  Orders: -     valACYclovir  HCl; Take 1 tablet (1,000 mg total) by mouth daily.  Dispense: 90 tablet; Refill: 3    Follow-up:  6 months  Mary Gerald Bluford DO [facility]

## 2024-07-31 NOTE — Assessment & Plan Note (Addendum)
 Stable. Has quit smoking. Recommend continued used of Wellbutrin . I did give her information on how to taper off if she desires.

## 2024-08-03 ENCOUNTER — Ambulatory Visit (HOSPITAL_COMMUNITY)

## 2024-08-09 DIAGNOSIS — M1712 Unilateral primary osteoarthritis, left knee: Secondary | ICD-10-CM | POA: Diagnosis not present

## 2024-08-10 ENCOUNTER — Encounter (HOSPITAL_COMMUNITY): Payer: Self-pay

## 2024-08-10 ENCOUNTER — Ambulatory Visit (HOSPITAL_COMMUNITY)
Admission: RE | Admit: 2024-08-10 | Discharge: 2024-08-10 | Disposition: A | Discharge status: Home / Self Care | Source: Ambulatory Visit | Attending: Family Medicine | Admitting: Family Medicine

## 2024-08-10 DIAGNOSIS — Z1231 Encounter for screening mammogram for malignant neoplasm of breast: Secondary | ICD-10-CM | POA: Diagnosis not present

## 2024-08-13 ENCOUNTER — Inpatient Hospital Stay
Admission: RE | Admit: 2024-08-13 | Discharge: 2024-08-13 | Disposition: A | Discharge status: Home / Self Care | Payer: Self-pay | Source: Ambulatory Visit | Attending: Family Medicine | Admitting: Family Medicine

## 2024-08-13 ENCOUNTER — Other Ambulatory Visit (HOSPITAL_COMMUNITY): Payer: Self-pay | Admitting: Family Medicine

## 2024-08-13 ENCOUNTER — Inpatient Hospital Stay
Admission: RE | Admit: 2024-08-13 | Discharge: 2024-08-13 | Disposition: A | Payer: Self-pay | Source: Ambulatory Visit | Attending: Family Medicine | Admitting: Family Medicine

## 2024-08-13 DIAGNOSIS — Z1231 Encounter for screening mammogram for malignant neoplasm of breast: Secondary | ICD-10-CM

## 2024-08-16 DIAGNOSIS — M25562 Pain in left knee: Secondary | ICD-10-CM | POA: Diagnosis not present

## 2024-08-16 DIAGNOSIS — M1712 Unilateral primary osteoarthritis, left knee: Secondary | ICD-10-CM | POA: Diagnosis not present

## 2024-08-23 DIAGNOSIS — M1712 Unilateral primary osteoarthritis, left knee: Secondary | ICD-10-CM | POA: Diagnosis not present

## 2024-09-19 ENCOUNTER — Encounter: Payer: Self-pay | Admitting: Acute Care

## 2024-09-24 DIAGNOSIS — H0015 Chalazion left lower eyelid: Secondary | ICD-10-CM | POA: Diagnosis not present

## 2024-09-24 DIAGNOSIS — H43813 Vitreous degeneration, bilateral: Secondary | ICD-10-CM | POA: Diagnosis not present

## 2024-09-24 DIAGNOSIS — H26491 Other secondary cataract, right eye: Secondary | ICD-10-CM | POA: Diagnosis not present

## 2024-09-24 DIAGNOSIS — H353133 Nonexudative age-related macular degeneration, bilateral, advanced atrophic without subfoveal involvement: Secondary | ICD-10-CM | POA: Diagnosis not present

## 2024-09-24 DIAGNOSIS — H43393 Other vitreous opacities, bilateral: Secondary | ICD-10-CM | POA: Diagnosis not present

## 2024-09-24 DIAGNOSIS — H35033 Hypertensive retinopathy, bilateral: Secondary | ICD-10-CM | POA: Diagnosis not present

## 2024-10-02 ENCOUNTER — Other Ambulatory Visit: Payer: Self-pay | Admitting: Acute Care

## 2024-10-02 DIAGNOSIS — Z122 Encounter for screening for malignant neoplasm of respiratory organs: Secondary | ICD-10-CM

## 2024-10-02 DIAGNOSIS — F17210 Nicotine dependence, cigarettes, uncomplicated: Secondary | ICD-10-CM

## 2024-10-02 DIAGNOSIS — Z87891 Personal history of nicotine dependence: Secondary | ICD-10-CM

## 2024-10-15 ENCOUNTER — Ambulatory Visit (HOSPITAL_COMMUNITY)
Admission: RE | Admit: 2024-10-15 | Discharge: 2024-10-15 | Disposition: A | Discharge status: Home / Self Care | Source: Ambulatory Visit | Attending: Family Medicine | Admitting: Family Medicine

## 2024-10-15 DIAGNOSIS — Z87891 Personal history of nicotine dependence: Secondary | ICD-10-CM | POA: Insufficient documentation

## 2024-10-15 DIAGNOSIS — Z122 Encounter for screening for malignant neoplasm of respiratory organs: Secondary | ICD-10-CM | POA: Diagnosis present

## 2024-10-15 DIAGNOSIS — I700 Atherosclerosis of aorta: Secondary | ICD-10-CM | POA: Diagnosis not present

## 2024-10-15 DIAGNOSIS — J439 Emphysema, unspecified: Secondary | ICD-10-CM | POA: Diagnosis not present

## 2024-10-15 DIAGNOSIS — F17210 Nicotine dependence, cigarettes, uncomplicated: Secondary | ICD-10-CM | POA: Diagnosis present

## 2024-10-15 DIAGNOSIS — I2510 Atherosclerotic heart disease of native coronary artery without angina pectoris: Secondary | ICD-10-CM | POA: Insufficient documentation

## 2024-11-12 LAB — OPHTHALMOLOGY REPORT-SCANNED

## 2025-01-29 ENCOUNTER — Ambulatory Visit: Admitting: Family Medicine

## 2025-05-10 ENCOUNTER — Ambulatory Visit
